# Patient Record
Sex: Female | Born: 1960
Health system: Southern US, Community
[De-identification: ages and names within clinical notes are randomized; demographics above are authoritative.]

## PROBLEM LIST (undated history)

## (undated) DIAGNOSIS — Z87442 Personal history of urinary calculi: Secondary | ICD-10-CM

## (undated) DIAGNOSIS — I428 Other cardiomyopathies: Secondary | ICD-10-CM

## (undated) DIAGNOSIS — I4891 Unspecified atrial fibrillation: Secondary | ICD-10-CM

## (undated) DIAGNOSIS — F329 Major depressive disorder, single episode, unspecified: Secondary | ICD-10-CM

## (undated) DIAGNOSIS — M8580 Other specified disorders of bone density and structure, unspecified site: Secondary | ICD-10-CM

## (undated) DIAGNOSIS — N200 Calculus of kidney: Secondary | ICD-10-CM

## (undated) DIAGNOSIS — E785 Hyperlipidemia, unspecified: Secondary | ICD-10-CM

## (undated) DIAGNOSIS — E669 Obesity, unspecified: Secondary | ICD-10-CM

## (undated) DIAGNOSIS — F32A Depression, unspecified: Secondary | ICD-10-CM

## (undated) DIAGNOSIS — J45909 Unspecified asthma, uncomplicated: Secondary | ICD-10-CM

## (undated) DIAGNOSIS — I5022 Chronic systolic (congestive) heart failure: Secondary | ICD-10-CM

## (undated) DIAGNOSIS — I2510 Atherosclerotic heart disease of native coronary artery without angina pectoris: Secondary | ICD-10-CM

## (undated) DIAGNOSIS — E039 Hypothyroidism, unspecified: Secondary | ICD-10-CM

## (undated) DIAGNOSIS — F419 Anxiety disorder, unspecified: Secondary | ICD-10-CM

## (undated) HISTORY — DX: Other cardiomyopathies: I42.8

## (undated) HISTORY — DX: Chronic systolic (congestive) heart failure: I50.22

## (undated) HISTORY — DX: Atherosclerotic heart disease of native coronary artery without angina pectoris: I25.10

## (undated) HISTORY — PX: ABDOMINAL HYSTERECTOMY: SHX81

## (undated) HISTORY — DX: Unspecified atrial fibrillation: I48.91

---
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
## 1997-02-13 HISTORY — PX: EYE SURGERY: SHX253

## 1998-03-25 ENCOUNTER — Other Ambulatory Visit: Admission: RE | Admit: 1998-03-25 | Discharge: 1998-03-25 | Payer: Self-pay | Admitting: Obstetrics & Gynecology

## 1998-05-19 ENCOUNTER — Ambulatory Visit (HOSPITAL_COMMUNITY): Admission: RE | Admit: 1998-05-19 | Discharge: 1998-05-19 | Payer: Self-pay | Admitting: Obstetrics & Gynecology

## 1998-05-19 ENCOUNTER — Encounter: Payer: Self-pay | Admitting: Obstetrics & Gynecology

## 1999-04-05 ENCOUNTER — Other Ambulatory Visit: Admission: RE | Admit: 1999-04-05 | Discharge: 1999-04-05 | Payer: Self-pay | Admitting: Obstetrics and Gynecology

## 2001-01-15 ENCOUNTER — Other Ambulatory Visit: Admission: RE | Admit: 2001-01-15 | Discharge: 2001-01-15 | Payer: Self-pay | Admitting: Obstetrics and Gynecology

## 2001-02-20 ENCOUNTER — Other Ambulatory Visit: Admission: RE | Admit: 2001-02-20 | Discharge: 2001-02-20 | Payer: Self-pay | Admitting: Endocrinology

## 2002-05-20 ENCOUNTER — Other Ambulatory Visit: Admission: RE | Admit: 2002-05-20 | Discharge: 2002-05-20 | Payer: Self-pay | Admitting: Obstetrics and Gynecology

## 2002-06-11 ENCOUNTER — Observation Stay (HOSPITAL_COMMUNITY): Admission: RE | Admit: 2002-06-11 | Discharge: 2002-06-12 | Payer: Self-pay | Admitting: Obstetrics and Gynecology

## 2002-06-11 ENCOUNTER — Encounter (INDEPENDENT_AMBULATORY_CARE_PROVIDER_SITE_OTHER): Payer: Self-pay | Admitting: Specialist

## 2003-08-03 ENCOUNTER — Other Ambulatory Visit: Admission: RE | Admit: 2003-08-03 | Discharge: 2003-08-03 | Payer: Self-pay | Admitting: Obstetrics and Gynecology

## 2004-10-06 ENCOUNTER — Other Ambulatory Visit: Admission: RE | Admit: 2004-10-06 | Discharge: 2004-10-06 | Payer: Self-pay | Admitting: Obstetrics and Gynecology

## 2007-02-20 ENCOUNTER — Other Ambulatory Visit: Admission: RE | Admit: 2007-02-20 | Discharge: 2007-02-20 | Payer: Self-pay | Admitting: Interventional Radiology

## 2007-02-20 ENCOUNTER — Encounter: Admission: RE | Admit: 2007-02-20 | Discharge: 2007-02-20 | Payer: Self-pay | Admitting: Surgery

## 2007-02-20 ENCOUNTER — Encounter (INDEPENDENT_AMBULATORY_CARE_PROVIDER_SITE_OTHER): Payer: Self-pay | Admitting: Interventional Radiology

## 2010-03-06 ENCOUNTER — Encounter: Payer: Self-pay | Admitting: Surgery

## 2010-07-01 NOTE — H&P (Signed)
NAME:  Kiara [name], GROTHE NO.:  [id number]   MEDICAL RECORD NO.:  [id number]                   PATIENT TYPE:  OBV   LOCATION:  9198                                 FACILITY:  [facility]   PHYSICIAN:  Juluis Mire, M.D.                DATE OF BIRTH:  12/20/1960   DATE OF ADMISSION:  06/11/2002  DATE OF DISCHARGE:                                HISTORY & PHYSICAL   HISTORY OF PRESENT ILLNESS:  Patient is a 50 year old, gravida 1, para 1,  married white female who presents for laparoscopic-assisted vaginal  hysterectomy.   In relation to the present admission, patient has noted that her cycles have  become extremely irregular; she has cycles every two to three weeks.  They  are fairly heavy.  This has been unresponsive to Prometrium.  She has been  unable to tolerate birth control pills due to side effects.  With this  bleeding pattern, her hemoglobin has been slightly depressed with a value of  12; we have remained stable with iron sulfate supplementation.  She did  undergo a saline-infusion ultrasound that was unremarkable.  Endometrial  sampling revealed proliferative endometrium with no evidence of hyperplastic  changes.  In view of the problems with continued abnormal bleeding,  unresponsive conservative management, she presents for laparoscopic-assisted  vaginal hysterectomy; ovaries will be left in at her request.  The potential  risks of bleeding at transformation have been discussed.   ALLERGIES:  In terms of allergies, the patient has no know drug allergies.   MEDICATIONS:  1. Zoloft.  2. Trazodone.  3. She is also on Fosamax for bone maintenance.   PAST MEDICAL HISTORY:  1. She had the usual childhood diseases without any significant sequelae.  2..  Of note, she has been diagnosed with significant osteopenia and has  been placed on Fosamax by Dr. __________.  She is also taking calcium and D  for this.  1. She does have a history of cervical  dysplasia, treated with cryotherapy     in the past.  2. She has also been diagnosed in the past with a thyroid nodule on the left     side; this was felt to be a benign process.  3. Also, under active management for depression.   OBSTETRICAL HISTORY:  One spontaneous vaginal delivery.   PAST SURGICAL HISTORY:  No previous surgical history.   FAMILY HISTORY:  Noncontributory.   SOCIAL HISTORY:  No tobacco or alcohol use.   REVIEW OF SYSTEMS:  Noncontributory.   PHYSICAL EXAMINATION:  VITAL SIGNS:  Patient is afebrile with stable vital  signs.  HEENT EXAM:  Patient is normocephalic.  Pupils are equal, round, and  reactive to light and accommodation.  Extraocular movements are intact.  Sclerae and conjunctivae are clear.  Oropharynx is clear.  NECK:  Without thyromegaly.  She continues to have a small nodular density  on the left lobe of the thyroid, unchanged.  BREASTS:  No skin or nipple changes, dominant mass, adenopathy, or nipple  discharge.  LUNGS:  Clear.  CARDIOVASCULAR SYSTEM:  Regular rhythm and rate without murmurs or gallops.  ABDOMINAL EXAM:  Benign.  There is no mass, organomegaly, or tenderness.  PELVIC:  Normal external genitalia.  Vaginal mucosa is clear.  Cervix is  unremarkable.  Uterus, normal size, shape, and contour.  Adnexa, free of  masses or tenderness.  Rectovaginal exam is clear.  EXTREMITIES:  Trace edema.  NEUROLOGIC EXAM:  Grossly within normal limits.   IMPRESSION:  1. Abnormal uterine bleeding with associated menorrhagia, unresponsive to     conservative management.  2. Osteopenia.  3. Left thyroid nodule.   PLAN:  Patient will undergo laparoscopic-assisted vaginal hysterectomy for  management of continued abnormal uterine bleeding.  Alternatives have been  discussed, the risks of surgery explained, including the risk of infection,  the risk of hemorrhage can necessitate transfusion with the risk of AIDS or  hepatitis, the risk of injury to  adjacent organs that could require further  exploratory surgery, the risk of deep venous thrombosis and pulmonary  embolus.  Patient expressed understanding of indications and risks.                                               Juluis Mire, M.D.    JSM/MEDQ  D:  06/11/2002  T:  06/11/2002  Job:  [id number]

## 2010-07-01 NOTE — Op Note (Signed)
NAME:  Kiara [name], Kiara [name]                          ACCOUNT NO.:  [id number]   MEDICAL RECORD NO.:  [id number]                   PATIENT TYPE:  OBV   LOCATION:  9327                                 FACILITY:  [facility]   PHYSICIAN:  Juluis Mire, M.D.                DATE OF BIRTH:  Sep 23, 1960   DATE OF PROCEDURE:  06/11/2002  DATE OF DISCHARGE:                                 OPERATIVE REPORT   PREOPERATIVE DIAGNOSIS:  Abnormal uterine bleeding in the form of  menorrhagia, unresponsive to conservative management.   POSTOPERATIVE DIAGNOSIS:  Abnormal uterine bleeding in the form of  menorrhagia, unresponsive to conservative management, with pathology  pending.   PROCEDURE:  Laparoscopically-assisted vaginal hysterectomy.   SURGEON:  Juluis Mire, M.D.   ASSISTANT:  Dineen Kid. Rana Snare, M.D.   ANESTHESIA:  General endotracheal.   ESTIMATED BLOOD LOSS:  300 mL.   PACKS AND DRAINS:  None.   BLOOD REPLACED:  None.   COMPLICATIONS:  None.   INDICATIONS:  As dictated in the history and physical.   DESCRIPTION OF PROCEDURE:  The patient was taken to the OR and placed in the  supine position.  After a satisfactory level of general endotracheal  anesthesia was obtained, the patient was placed in the dorsal lithotomy  position using the Allen stirrups.  The abdomen, perineum, and vagina were  prepped out with Betadine.  A Hulka tenaculum was put in place and secured.  The bladder was emptied by in-and-out catheterization.  The patient was then  draped as a sterile field.  A subumbilical incision was made with a knife.  The Veress needle was introduced into the abdominal cavity.  The abdomen was  insufflated with approximately 4 L of carbon dioxide.  The operating  laparoscope was introduced.  Visualization with no evidence of injury to  adjacent organs.  A 12 mm trocar was put in place in the suprapubic area  under direct visualization.  Visualization of the pelvic cavity, which  revealed  the uterus to be upper limits of normal size.  Tubes and ovaries  were unremarkable.  There was no evidence of pelvic endometriosis or other  pelvic processes.  Appendix was surgically absent.  The upper abdomen  including the liver, tip of the gallbladder, and both lateral gutters, were  clear.  Using the Gyrus tripolar, the adnexa were freed up from the side of  the uterus.  First the right round ligament was cauterized and incised, the  right tube and mesosalpinx were cauterized and incised.  Then the right  utero-ovarian pedicle was cauterized and incised.  The ureter was well out  of the operative field.  Next the left round ligament was cauterized and  incised and the left tube and mesosalpinx were cauterized and incised, and  the left utero-ovarian pedicle was cauterized and incised.  We had good  hemostasis and freeing of the  adnexa.   At this point in time the abdomen was deflated of carbon dioxide and the  laparoscope was removed.  The legs were repositioned.  The Hulka tenaculum  was then removed.  A weighted speculum was placed in the vaginal vault.  The  cervix was grasped with a Christella Hartigan tenaculum.  The cul-de-sac was entered  sharply.  Both uterosacral ligaments were clamped, cut, and suture ligated  with 0 Vicryl.  The reflection of the vaginal mucosa anteriorly was incised  and the bladder flap dissected superiorly.  The paracervical tissue was  clamped, cut, and suture ligated with 0 Vicryl.  The vesicouterine space was  entered sharply and a retractor was put in place to retract the bladder  superiorly.  Using the clamp, cut, and tie technique with suture ligature of  0 Vicryl, the parametrium was serially separated from the sides of the  uterus.  The uterus was then flipped and the remaining pedicles were clamped  and cut and the uterus passed off the operative field.  The pedicles were  secured with free ties of 0 Vicryl.  A uterosacral plication stitch of 0  Vicryl was  put in place.  The vaginal mucosa was reapproximated in a  vertical fashion with interrupted figure-of-eights of 0 Vicryl.  We had good  hemostasis.  A sponge on a sponge stick was placed in the vaginal vault.  A  Foley was placed to straight drainage, which revealed an adequate amount of  clear urine.   At this point in time the laparoscope was reintroduced, the abdomen was  reinflated with carbon dioxide.  Some oozing at the bladder base was  controlled using cautery.  Otherwise we had excellent hemostasis and good,  clear urine output.  Both ovarian pedicles were hemostatically intact.  The  abdomen was deflated of carbon dioxide, all trocars removed.  The  subumbilical incision closed with interrupted subcuticulars of 4-0 Vicryl.  The suprapubic incision was closed with Steri-Strips.  The sponge on a  sponge stick was removed.  The patient was taken out of the dorsal lithotomy  position and once alert and extubated transferred to the recovery room in  good condition.  Sponge, instrument, and needle count reported as correct by  the circulating nurse x2.                                               Juluis Mire, M.D.    JSM/MEDQ  D:  06/12/2002  T:  06/12/2002  Job:  [id number]

## 2010-07-01 NOTE — Discharge Summary (Signed)
   NAME:  Kiara [name], Kiara [name]                          ACCOUNT NO.:  [id number]   MEDICAL RECORD NO.:  [id number]                   PATIENT TYPE:  OBV   LOCATION:  9327                                 FACILITY:  [facility]   PHYSICIAN:  Juluis Mire, M.D.                DATE OF BIRTH:  [birth year]   DATE OF ADMISSION:  06/11/2002  DATE OF DISCHARGE:  06/12/2002                                 DISCHARGE SUMMARY   ADMISSION DIAGNOSES:  Abnormal uterine bleeding, thought to be secondary to  uterine adenomyosis.   POSTOPERATIVE DIAGNOSES:  Abnormal uterine bleeding, thought to be secondary  to uterine adenomyosis.   PATHOLOGY:  Pending.   PROCEDURE:  Laparoscopic-assisted vaginal hysterectomy.   HISTORY AND PHYSICAL:  For a complete history and physical, please see the  dictated noted.   HOSPITAL COURSE:  Patient underwent laparoscopic-assisted vaginal  hysterectomy without complications.  Postop, did well.  First postop day,  was afebrile, stable vital signs.  Abdomen was soft, nontender.  Bowel  sounds active.  Incisions were intact.  She had no active vaginal bleeding.  She was voiding without difficulty and tolerating her diet.  Postop  hemoglobin 10.9.   PROBLEMS OR COMPLICATIONS:  None were encountered during her stay in the  hospital.   CONDITION ON DISCHARGE:  Patient was discharged home in stable condition.   DISPOSITION:   ACTIVITY:  Patient is to avoid lifting, vaginal entry, or driving a car.  She is to watch for signs of infection, nausea, vomiting, increase in  abdominal pain, or active vaginal bleeding.   FOLLOW UP:  Follow up in the office in one week.   DISCHARGE MEDICATIONS:  Discharged home on Tylox as needed for pain.                                               Juluis Mire, M.D.    JSM/MEDQ  D:  06/12/2002  T:  06/12/2002  Job:  [id number]

## 2011-02-14 DIAGNOSIS — Z87442 Personal history of urinary calculi: Secondary | ICD-10-CM

## 2011-02-14 DIAGNOSIS — N200 Calculus of kidney: Secondary | ICD-10-CM

## 2011-02-14 HISTORY — DX: Personal history of urinary calculi: Z87.442

## 2011-02-14 HISTORY — DX: Calculus of kidney: N20.0

## 2011-12-19 ENCOUNTER — Telehealth (INDEPENDENT_AMBULATORY_CARE_PROVIDER_SITE_OTHER): Payer: Self-pay

## 2011-12-19 ENCOUNTER — Other Ambulatory Visit (INDEPENDENT_AMBULATORY_CARE_PROVIDER_SITE_OTHER): Payer: Self-pay

## 2011-12-19 DIAGNOSIS — E042 Nontoxic multinodular goiter: Secondary | ICD-10-CM

## 2011-12-19 NOTE — Telephone Encounter (Signed)
LMOM for pt to call. We no longer do U/S in office. Pt needs u/s at [facility] prior to appt. Awaiting call from pt. Order in epic.

## 2011-12-21 ENCOUNTER — Encounter (INDEPENDENT_AMBULATORY_CARE_PROVIDER_SITE_OTHER): Payer: Self-pay

## 2011-12-26 ENCOUNTER — Encounter (INDEPENDENT_AMBULATORY_CARE_PROVIDER_SITE_OTHER): Payer: Self-pay | Admitting: Surgery

## 2012-08-21 ENCOUNTER — Other Ambulatory Visit (HOSPITAL_COMMUNITY): Payer: Self-pay | Admitting: Obstetrics and Gynecology

## 2012-08-21 DIAGNOSIS — E041 Nontoxic single thyroid nodule: Secondary | ICD-10-CM

## 2012-08-23 ENCOUNTER — Ambulatory Visit (HOSPITAL_COMMUNITY): Payer: BC Managed Care – PPO

## 2012-08-27 ENCOUNTER — Ambulatory Visit (HOSPITAL_COMMUNITY)
Admission: RE | Admit: 2012-08-27 | Discharge: 2012-08-27 | Disposition: A | Discharge status: Home / Self Care | Payer: BC Managed Care – PPO | Source: Ambulatory Visit | Attending: Obstetrics and Gynecology | Admitting: Obstetrics and Gynecology

## 2012-08-27 DIAGNOSIS — E041 Nontoxic single thyroid nodule: Secondary | ICD-10-CM | POA: Insufficient documentation

## 2013-02-13 DIAGNOSIS — I509 Heart failure, unspecified: Secondary | ICD-10-CM

## 2013-02-13 DIAGNOSIS — I499 Cardiac arrhythmia, unspecified: Secondary | ICD-10-CM

## 2013-02-13 HISTORY — DX: Heart failure, unspecified: I50.9

## 2013-02-13 HISTORY — DX: Cardiac arrhythmia, unspecified: I49.9

## 2013-07-14 HISTORY — PX: CARDIAC CATHETERIZATION: SHX172

## 2013-07-16 ENCOUNTER — Emergency Department (HOSPITAL_COMMUNITY): Payer: BC Managed Care – PPO

## 2013-07-16 ENCOUNTER — Inpatient Hospital Stay (HOSPITAL_COMMUNITY)
Admission: EM | Admit: 2013-07-16 | Discharge: 2013-07-21 | DRG: 287 | Disposition: A | Discharge status: Home / Self Care | Payer: BC Managed Care – PPO | Attending: Cardiology | Admitting: Cardiology

## 2013-07-16 ENCOUNTER — Encounter (HOSPITAL_COMMUNITY): Payer: Self-pay | Admitting: Emergency Medicine

## 2013-07-16 DIAGNOSIS — I4891 Unspecified atrial fibrillation: Secondary | ICD-10-CM

## 2013-07-16 DIAGNOSIS — F329 Major depressive disorder, single episode, unspecified: Secondary | ICD-10-CM | POA: Diagnosis present

## 2013-07-16 DIAGNOSIS — E669 Obesity, unspecified: Secondary | ICD-10-CM | POA: Diagnosis present

## 2013-07-16 DIAGNOSIS — I5189 Other ill-defined heart diseases: Secondary | ICD-10-CM | POA: Diagnosis present

## 2013-07-16 DIAGNOSIS — Z7901 Long term (current) use of anticoagulants: Secondary | ICD-10-CM

## 2013-07-16 DIAGNOSIS — E785 Hyperlipidemia, unspecified: Secondary | ICD-10-CM | POA: Diagnosis present

## 2013-07-16 DIAGNOSIS — E039 Hypothyroidism, unspecified: Secondary | ICD-10-CM | POA: Diagnosis present

## 2013-07-16 DIAGNOSIS — I420 Dilated cardiomyopathy: Secondary | ICD-10-CM

## 2013-07-16 DIAGNOSIS — J45909 Unspecified asthma, uncomplicated: Secondary | ICD-10-CM | POA: Diagnosis present

## 2013-07-16 DIAGNOSIS — I509 Heart failure, unspecified: Secondary | ICD-10-CM

## 2013-07-16 DIAGNOSIS — I059 Rheumatic mitral valve disease, unspecified: Secondary | ICD-10-CM | POA: Diagnosis present

## 2013-07-16 DIAGNOSIS — F3289 Other specified depressive episodes: Secondary | ICD-10-CM | POA: Diagnosis present

## 2013-07-16 DIAGNOSIS — I5021 Acute systolic (congestive) heart failure: Principal | ICD-10-CM | POA: Diagnosis present

## 2013-07-16 DIAGNOSIS — Z8709 Personal history of other diseases of the respiratory system: Secondary | ICD-10-CM

## 2013-07-16 DIAGNOSIS — I2510 Atherosclerotic heart disease of native coronary artery without angina pectoris: Secondary | ICD-10-CM | POA: Diagnosis present

## 2013-07-16 DIAGNOSIS — Z6829 Body mass index (BMI) 29.0-29.9, adult: Secondary | ICD-10-CM

## 2013-07-16 DIAGNOSIS — I340 Nonrheumatic mitral (valve) insufficiency: Secondary | ICD-10-CM | POA: Diagnosis present

## 2013-07-16 DIAGNOSIS — Z881 Allergy status to other antibiotic agents status: Secondary | ICD-10-CM

## 2013-07-16 DIAGNOSIS — Z87442 Personal history of urinary calculi: Secondary | ICD-10-CM

## 2013-07-16 DIAGNOSIS — I200 Unstable angina: Secondary | ICD-10-CM

## 2013-07-16 DIAGNOSIS — I428 Other cardiomyopathies: Secondary | ICD-10-CM | POA: Diagnosis present

## 2013-07-16 DIAGNOSIS — Z79899 Other long term (current) drug therapy: Secondary | ICD-10-CM

## 2013-07-16 DIAGNOSIS — Z9089 Acquired absence of other organs: Secondary | ICD-10-CM

## 2013-07-16 HISTORY — DX: Unspecified asthma, uncomplicated: J45.909

## 2013-07-16 HISTORY — DX: Obesity, unspecified: E66.9

## 2013-07-16 HISTORY — DX: Hyperlipidemia, unspecified: E78.5

## 2013-07-16 HISTORY — DX: Major depressive disorder, single episode, unspecified: F32.9

## 2013-07-16 HISTORY — DX: Depression, unspecified: F32.A

## 2013-07-16 HISTORY — DX: Hypothyroidism, unspecified: E03.9

## 2013-07-16 HISTORY — DX: Calculus of kidney: N20.0

## 2013-07-16 HISTORY — DX: Other specified disorders of bone density and structure, unspecified site: M85.80

## 2013-07-16 LAB — TSH: TSH: 1.45 u[IU]/mL (ref 0.350–4.500)

## 2013-07-16 LAB — BASIC METABOLIC PANEL
BUN: 11 mg/dL (ref 6–23)
CO2: 18 mEq/L — ABNORMAL LOW (ref 19–32)
Calcium: 9.3 mg/dL (ref 8.4–10.5)
Chloride: 108 mEq/L (ref 96–112)
Creatinine, Ser: 0.65 mg/dL (ref 0.50–1.10)
GFR calc Af Amer: >90 mL/min (ref >90)
GFR calc non Af Amer: >90 mL/min (ref >90)
Glucose, Bld: 105 mg/dL — ABNORMAL HIGH (ref 70–99)
Potassium: 4.3 mEq/L (ref 3.7–5.3)
Sodium: 140 mEq/L (ref 137–147)

## 2013-07-16 LAB — TROPONIN I
Troponin I: <0.3 ng/mL (ref <0.30)
Troponin I: <0.3 ng/mL (ref <0.30)

## 2013-07-16 LAB — CBC
HCT: 41.4 % (ref 36.0–46.0)
Hemoglobin: 14.2 g/dL (ref 12.0–15.0)
MCH: 30.1 pg (ref 26.0–34.0)
MCHC: 34.3 g/dL (ref 30.0–36.0)
MCV: 87.7 fL (ref 78.0–100.0)
Platelets: 166 10*3/uL (ref 150–400)
RBC: 4.72 MIL/uL (ref 3.87–5.11)
RDW: 13.2 % (ref 11.5–15.5)
WBC: 7.2 10*3/uL (ref 4.0–10.5)

## 2013-07-16 LAB — PROTIME-INR
INR: 1.04 (ref 0.00–1.49)
Prothrombin Time: 13.4 seconds (ref 11.6–15.2)

## 2013-07-16 LAB — I-STAT TROPONIN, ED: Troponin i, poc: 0 ng/mL (ref 0.00–0.08)

## 2013-07-16 LAB — MRSA PCR SCREENING: MRSA by PCR: NEGATIVE

## 2013-07-16 LAB — PRO B NATRIURETIC PEPTIDE: Pro B Natriuretic peptide (BNP): 1681 pg/mL — ABNORMAL HIGH (ref 0–125)

## 2013-07-16 LAB — HEMOGLOBIN A1C
Hgb A1c MFr Bld: 5.5 % (ref <5.7)
Mean Plasma Glucose: 111 mg/dL (ref <117)

## 2013-07-16 MED ORDER — DILTIAZEM HCL 100 MG IV SOLR: 5.0000 mg/h | INTRAVENOUS | Status: DC

## 2013-07-16 MED ORDER — ASPIRIN 81 MG PO CHEW: 324.0000 mg | CHEWABLE_TABLET | ORAL | Status: DC

## 2013-07-16 MED ORDER — ASPIRIN 300 MG RE SUPP: 300.0000 mg | RECTAL | Status: DC

## 2013-07-16 MED ORDER — DILTIAZEM HCL 25 MG/5ML IV SOLN
15.0000 mg | Freq: Once | INTRAVENOUS | Status: AC
  Administered 2013-07-16: 15 mg via INTRAVENOUS
  Filled 2013-07-16: qty 5

## 2013-07-16 MED ORDER — DEXTROSE 5 % IV SOLN
5.0000 mg/h | INTRAVENOUS | Status: DC
  Administered 2013-07-18: 5 mg/h via INTRAVENOUS
  Filled 2013-07-16 (×2): qty 100

## 2013-07-16 MED ORDER — DEXTROSE 5 % IV SOLN
5.0000 mg/h | INTRAVENOUS | Status: DC
  Administered 2013-07-16 – 2013-07-17 (×2): 5 mg/h via INTRAVENOUS
  Administered 2013-07-17: 15 mg/h via INTRAVENOUS
  Administered 2013-07-18: 5 mg/h via INTRAVENOUS
  Filled 2013-07-16 (×2): qty 100

## 2013-07-16 MED ORDER — HEPARIN BOLUS VIA INFUSION
4000.0000 [IU] | Freq: Once | INTRAVENOUS | Status: AC
  Administered 2013-07-16: 4000 [IU] via INTRAVENOUS
  Filled 2013-07-16: qty 4000

## 2013-07-16 MED ORDER — NITROGLYCERIN IN D5W 200-5 MCG/ML-% IV SOLN
2.0000 ug/min | INTRAVENOUS | Status: DC
  Administered 2013-07-16: 5 ug/min via INTRAVENOUS
  Filled 2013-07-16: qty 250

## 2013-07-16 MED ORDER — ATORVASTATIN CALCIUM 40 MG PO TABS
40.0000 mg | ORAL_TABLET | Freq: Every day | ORAL | Status: DC
  Administered 2013-07-16 – 2013-07-20 (×5): 40 mg via ORAL
  Filled 2013-07-16 (×7): qty 1

## 2013-07-16 MED ORDER — ACETAMINOPHEN 325 MG PO TABS
650.0000 mg | ORAL_TABLET | Freq: Four times a day (QID) | ORAL | Status: DC | PRN
  Administered 2013-07-16 – 2013-07-21 (×4): 650 mg via ORAL
  Filled 2013-07-16 (×5): qty 2

## 2013-07-16 MED ORDER — ASPIRIN EC 81 MG PO TBEC
81.0000 mg | DELAYED_RELEASE_TABLET | Freq: Every day | ORAL | Status: DC
  Administered 2013-07-17: 81 mg via ORAL
  Filled 2013-07-16: qty 1

## 2013-07-16 MED ORDER — HEPARIN (PORCINE) IN NACL 100-0.45 UNIT/ML-% IJ SOLN
1250.0000 [IU]/h | INTRAMUSCULAR | Status: DC
  Administered 2013-07-16 – 2013-07-17 (×2): 1250 [IU]/h via INTRAVENOUS
  Filled 2013-07-16 (×2): qty 250

## 2013-07-16 MED ORDER — NITROGLYCERIN 0.4 MG SL SUBL: 0.4000 mg | SUBLINGUAL_TABLET | SUBLINGUAL | Status: DC | PRN

## 2013-07-16 MED ORDER — FUROSEMIDE 10 MG/ML IJ SOLN
40.0000 mg | Freq: Two times a day (BID) | INTRAMUSCULAR | Status: AC
Start: 2013-07-16 — End: 2013-07-17
  Administered 2013-07-16 – 2013-07-17 (×2): 40 mg via INTRAVENOUS
  Filled 2013-07-16 (×2): qty 4

## 2013-07-16 NOTE — H&P (Signed)
Kiara [name] is an 53 y.o. female.   Chief Complaint: CP, Afib HPI:   The patient is a 53 yo obese female with a history of HLD, asthma, osteopenia.  She works in [employer]  She presents with CP and in [place].  She was given IV diltiazem and rate improved.    The patient describes "an elephant sitting on my chest" most severe the last two days but has had the feeling for over a month.  She also reports SOB, heart racing, and diaphoresis but otherwise denies nausea, vomiting, fever, orthopnea, dizziness, PND, cough, congestion, abdominal pain, hematochezia, melena, lower extremity edema, claudication.  No family history off CAD.     Past Medical History  Diagnosis Date  . Asthma   . Hyperlipemia   . Obesity   . Osteopenia   . Nephrolithiasis     Past Surgical History  Procedure Laterality Date  . Appendectomy    . Abdominal hysterectomy      History reviewed. No pertinent family history. Social History:  reports that she has never smoked. She does not have any smokeless tobacco history on file. She reports that she drinks alcohol. She reports that she does not use illicit drugs.  Allergies: No Known Allergies   (Not in a hospital admission)  Results for orders placed during the hospital encounter of 07/16/13 (from the past 48 hour(s))  CBC     Status: None   Collection Time    07/16/13  1:35 PM      Result Value Ref Range   WBC 7.2  4.0 - 10.5 K/uL   RBC 4.72  3.87 - 5.11 MIL/uL   Hemoglobin 14.2  12.0 - 15.0 g/dL   HCT 41.4  36.0 - 46.0 %   MCV 87.7  78.0 - 100.0 fL   MCH 30.1  26.0 - 34.0 pg   MCHC 34.3  30.0 - 36.0 g/dL   RDW 13.2  11.5 - 15.5 %   Platelets 166  150 - 400 K/uL  BASIC METABOLIC PANEL     Status: Abnormal   Collection Time    07/16/13  1:35 PM      Result Value Ref Range   Sodium 140  137 - 147 mEq/L   Potassium 4.3  3.7 - 5.3 mEq/L   Chloride 108  96 - 112 mEq/L   CO2 18 (*) 19 - 32 mEq/L   Glucose, Bld 105 (*) 70 - 99 mg/dL    BUN 11  6 - 23 mg/dL   Creatinine, Ser 0.65  0.50 - 1.10 mg/dL   Calcium 9.3  8.4 - 10.5 mg/dL   GFR calc non Af Amer >90  >90 mL/min   GFR calc Af Amer >90  >90 mL/min   Comment: (NOTE)     The eGFR has been calculated using the CKD EPI equation.     This calculation has not been validated in all clinical situations.     eGFR's persistently <90 mL/min signify possible Chronic Kidney     Disease.  PROTIME-INR     Status: None   Collection Time    07/16/13  1:35 PM      Result Value Ref Range   Prothrombin Time 13.4  11.6 - 15.2 seconds   INR 1.04  0.00 - 1.49  PRO B NATRIURETIC PEPTIDE     Status: Abnormal   Collection Time    07/16/13  1:35 PM      Result  Value Ref Range   Pro B Natriuretic peptide (BNP) 1681.0 (*) 0 - 125 pg/mL  I-STAT TROPOININ, ED     Status: None   Collection Time    07/16/13  1:40 PM      Result Value Ref Range   Troponin i, poc 0.00  0.00 - 0.08 ng/mL   Comment 3            Comment: Due to the release kinetics of cTnI,     a negative result within the first hours     of the onset of symptoms does not rule out     myocardial infarction with certainty.     If myocardial infarction is still suspected,     repeat the test at appropriate intervals.   Dg Chest Port 1 View  07/16/2013   CLINICAL DATA:  AFib with RVR  EXAM: PORTABLE CHEST - 1 VIEW  COMPARISON:  None.  FINDINGS: Chronic interstitial markings. Mild left basilar opacity, likely atelectasis. No pleural effusion or pneumothorax.  The heart is normal in size.  IMPRESSION: No evidence of acute cardiopulmonary disease.   Electronically Signed   By: Julian Hy M.D.   On: 07/16/2013 13:24    Review of Systems  Constitutional: Positive for diaphoresis. Negative for fever.  HENT: Negative for congestion.   Respiratory: Positive for shortness of breath. Negative for cough and wheezing.   Cardiovascular: Positive for chest pain and palpitations. Negative for orthopnea, leg swelling and PND.   Gastrointestinal: Negative for nausea, vomiting, abdominal pain, blood in stool and melena.  Genitourinary: Negative for hematuria.  Musculoskeletal: Negative for myalgias.  Neurological: Negative for dizziness.  All other systems reviewed and are negative.   Blood pressure 123/79, pulse 99, temperature 98.7 F (37.1 C), temperature source Oral, resp. rate 17, height '5\' 7"'  (1.702 m), weight 189 lb (85.73 kg), SpO2 95.00%. Physical Exam  Nursing note and vitals reviewed. Constitutional: She is oriented to person, place, and time. She appears well-developed.  Obese  HENT:  Head: Normocephalic and atraumatic.  Mouth/Throat: Oropharynx is clear and moist.  Eyes: EOM are normal. Pupils are equal, round, and reactive to light. No scleral icterus.  Neck: Normal range of motion. JVD present.  Cardiovascular: Normal rate, S1 normal and S2 normal.  An irregularly irregular rhythm present.  No murmur heard. Pulses:      Radial pulses are 2+ on the right side, and 2+ on the left side.       Dorsalis pedis pulses are 2+ on the right side, and 2+ on the left side.  No carotid bruit.  Respiratory: Effort normal and breath sounds normal. No respiratory distress. She has no wheezes. She has no rales.  GI: Soft. Bowel sounds are normal. She exhibits no distension. There is no tenderness.  Musculoskeletal: She exhibits no edema.  Lymphadenopathy:    She has no cervical adenopathy.  Neurological: She is alert and oriented to person, place, and time. She exhibits normal muscle tone.  Skin: Skin is warm and dry.  Psychiatric: She has a normal mood and affect.     Assessment/Plan  Active Problems:   Unstable angina   Atrial fibrillation with RVR   Obesity  Plan:  Admit to stepdown.  Start IV heparin and NTG.  Titrate for CP as BP allows.  Continue IV cardizem.  Cycle troponin and order echo, lipids, TSH, A1C.  NPO after MN for cath or myovue.  May need TEE/DCCV if she does not convert.  Tarri Fuller, [facility] 07/16/2013, 3:12 PM  Patient seen with PA, agree with the above note.  1. Atrial fibrillation: Patient was noted to be in atrial fibrillation with RVR.  She has felt tachypalpitations for several weeks along with chest pressure and exertional dyspnea.  Her atrial fibrillation may have been present for a while now.   - Diltiazem gtt for now for rate control.  - Heparin gtt for now.  If she rule out for MI, chest pain/pressure resolves rate control, and EF is normal, can start Xarelto or Eliquis.  Otherwise, she will need cardiac catheterization.   - Echo today.  - If she does not convert to NSR on her own, would plan TEE-guided cardioversion eventually given her significant symptoms while in atrial fibrillation.  2. Acute CHF: Patient is volume overloaded on exam with orthopnea and exertional dyspnea.  BNP elevated.  ?Acute diastolic CHF triggered by afib/RVR versus tachy-mediated cardiomyopathy.   - Echo - Lasix 40 mg IV bid  3. Chest pressure/pain: No ischemic ECG changes, initial troponin negative.  She has some ongoing pain.  Continue aspirin and heparin gtt for now.  Will cycle cardiac enzymes and work on slowing heart rate.  If CP resolves with rate control, cardiac enzymes are negative, and EF ok on echo, would plan for outpatient Cardiolite.  Otherwise, think cardiac cath would be reasonable tomorrow morning. If no cath, start NOAC tomorrow.   Larey Dresser 07/16/2013 4:10 PM

## 2013-07-16 NOTE — ED Notes (Signed)
Admitting MD at bedside at this time.

## 2013-07-16 NOTE — ED Provider Notes (Signed)
CSN: [id number]     Arrival date & time 07/16/13  1236 History   First MD Initiated Contact with Patient 07/16/13 1242     Chief Complaint  Patient presents with  . Atrial Fibrillation     (Consider location/radiation/quality/duration/timing/severity/associated sxs/prior Treatment) HPI Comments: Intermittent CP, SOB. CP constant since yesterday, pressure like, nonradiating.  Patient is a 53 y.o. female presenting with atrial fibrillation. The history is provided by the patient.  Atrial Fibrillation This is a new problem. The current episode started more than 2 days ago. The problem occurs daily. The problem has been gradually worsening. Associated symptoms include chest pain and shortness of breath. Pertinent negatives include no abdominal pain. Nothing aggravates the symptoms. Nothing relieves the symptoms. She has tried nothing for the symptoms.    Past Medical History  Diagnosis Date  . Asthma   . Hyperlipemia   . Obesity   . Osteopenia   . Nephrolithiasis    Past Surgical History  Procedure Laterality Date  . Appendectomy    . Abdominal hysterectomy     History reviewed. No pertinent family history. History  Substance Use Topics  . Smoking status: Never Smoker   . Smokeless tobacco: Not on file  . Alcohol Use: Yes     Comment: social   OB History   Grav Para Term Preterm Abortions TAB SAB Ect Mult Living                 Review of Systems  Constitutional: Negative for fever.  Respiratory: Positive for shortness of breath. Negative for cough.   Cardiovascular: Positive for chest pain and palpitations.  Gastrointestinal: Negative for vomiting and abdominal pain.  All other systems reviewed and are negative.     Allergies  Review of patient's allergies indicates no known allergies.  Home Medications   Prior to Admission medications   Medication Sig Start Date End Date Taking? Authorizing Provider  sertraline (ZOLOFT) 100 MG tablet  07/04/13   Historical  Provider, MD   BP 123/79  Pulse 99  Temp(Src) 98.7 F (37.1 C) (Oral)  Resp 17  Ht 5\' 7"  (1.702 m)  Wt 189 lb (85.73 kg)  BMI 29.59 kg/m2  SpO2 95% Physical Exam  Nursing note and vitals reviewed. Constitutional: She is oriented to person, place, and time. She appears well-developed and well-nourished. No distress.  HENT:  Head: Normocephalic and atraumatic.  Eyes: EOM are normal. Pupils are equal, round, and reactive to light.  Neck: Normal range of motion. Neck supple.  Cardiovascular: An irregularly irregular rhythm present. Tachycardia present.  Exam reveals no friction rub.   No murmur heard. Pulmonary/Chest: Effort normal and breath sounds normal. No respiratory distress. She has no wheezes. She has no rales.  Abdominal: Soft. She exhibits no distension. There is no tenderness. There is no rebound.  Musculoskeletal: Normal range of motion. She exhibits no edema.  Neurological: She is alert and oriented to person, place, and time.  Skin: She is not diaphoretic.    ED Course  Procedures (including critical care time) Labs Review Labs Reviewed  BASIC METABOLIC PANEL - Abnormal; Notable for the following:    CO2 18 (*)    Glucose, Bld 105 (*)    All other components within normal limits  PRO B NATRIURETIC PEPTIDE - Abnormal; Notable for the following:    Pro B Natriuretic peptide (BNP) 1681.0 (*)    All other components within normal limits  CBC  PROTIME-INR  I-STAT TROPOININ, ED  Imaging Review Dg Chest Port 1 View  07/16/2013   CLINICAL DATA:  AFib with RVR  EXAM: PORTABLE CHEST - 1 VIEW  COMPARISON:  None.  FINDINGS: Chronic interstitial markings. Mild left basilar opacity, likely atelectasis. No pleural effusion or pneumothorax.  The heart is normal in size.  IMPRESSION: No evidence of acute cardiopulmonary disease.   Electronically Signed   By: Charline BillsSriyesh  Krishnan M.D.   On: 07/16/2013 13:24     EKG Interpretation   Date/Time:  Wednesday July 16 2013 12:45:30  EDT Ventricular Rate:  151 PR Interval:    QRS Duration: 90 QT Interval:  314 QTC Calculation: 498 R Axis:   85 Text Interpretation:  Atrial fibrillation Borderline prolonged QT interval  No prior EKGs Confirmed by Gwendolyn GrantWALDEN  MD, Staton Markey ([id number]) on 07/16/2013 3:06:32 PM      MDM   Final diagnoses:  Atrial fibrillation with RVR    60F presents with palpitations, chest pain, SOB. [facility]. No hx of Afib before. Intermittent CP and palpitations. CP described as heaviness. Here normal BPs, irreguarly irregular rhythm with tachycardia. Lungs clear, abdomen benign. Dilt started with good response. Patient admitted to Cards.    Dagmar HaitWilliam Gabrien Mentink, MD 07/16/13 [phone]

## 2013-07-16 NOTE — ED Notes (Signed)
Patient arrived via [facility] from Dr. Isidore Moos. Patient has been having shortness of breath x2 days that has increased today with heart palpations. She went to her Dr. Isidore Moos and she was found to be in Afib RVR that is new for her. She denies any diaphoresis, nausea. She also has heaviness in her chest.

## 2013-07-16 NOTE — Consult Note (Signed)
ANTICOAGULATION CONSULT NOTE - Initial Consult  Pharmacy Consult for Heparin Indication: atrial fibrillation, r/o ACS  No Known Allergies  Patient Measurements: Height: 5\' 7"  (170.2 cm) Weight: 189 lb (85.73 kg) IBW/kg (Calculated) : 61.6 Heparin Dosing Weight: 79kg  Vital Signs: Temp: 98.7 F (37.1 C) (06/03 1252) Temp src: Oral (06/03 1252) BP: 112/73 mmHg (06/03 1542) Pulse Rate: 99 (06/03 1542)  Labs:  Recent Labs  07/16/13 1335  HGB 14.2  HCT 41.4  PLT 166  LABPROT 13.4  INR 1.04  CREATININE 0.65    Estimated Creatinine Clearance: 89.3 ml/min (by C-G formula based on Cr of 0.65).   Medical History: Past Medical History  Diagnosis Date  . Asthma   . Hyperlipemia   . Obesity   . Osteopenia   . Nephrolithiasis     Medications:  No anticoagulants pta  Assessment: Kiara [name] presents to the ED with chest pain and SOB, found to be in afib RVR (CHADSVASC=1). She will begin IV heparin. Baseline renal function and CBC ok.  Goal of Therapy:  Heparin level 0.3-0.7 units/ml Monitor platelets by anticoagulation protocol: Yes   Plan:  1) Heparin bolus 4000 units x 1 2) Heparin drip at 1250 units/hr 3) Check 6 hour heparin level 4) Daily heparin level and CBC  Hessie Diener Keefer Soulliere 07/16/2013,3:48 PM

## 2013-07-16 NOTE — ED Notes (Signed)
Pt brought from Trauma A to Pod D31; pt undressed, placed in gown, on monitor, continuous pulse oximetry, blood pressure cuff and oxygen [place] (2L); family waiting outside of door

## 2013-07-16 NOTE — ED Notes (Signed)
Pt is talking with family at this time, no needs

## 2013-07-17 ENCOUNTER — Encounter (HOSPITAL_COMMUNITY)
Admission: EM | Disposition: A | Discharge status: Home / Self Care | Payer: BC Managed Care – PPO | Source: Home / Self Care | Attending: Cardiology

## 2013-07-17 DIAGNOSIS — I059 Rheumatic mitral valve disease, unspecified: Secondary | ICD-10-CM

## 2013-07-17 DIAGNOSIS — I340 Nonrheumatic mitral (valve) insufficiency: Secondary | ICD-10-CM

## 2013-07-17 DIAGNOSIS — I428 Other cardiomyopathies: Secondary | ICD-10-CM

## 2013-07-17 HISTORY — PX: LEFT HEART CATHETERIZATION WITH CORONARY ANGIOGRAM: SHX5451

## 2013-07-17 HISTORY — DX: Nonrheumatic mitral (valve) insufficiency: I34.0

## 2013-07-17 LAB — HEPARIN LEVEL (UNFRACTIONATED)
Heparin Unfractionated: 0.36 IU/mL (ref 0.30–0.70)
Heparin Unfractionated: 0.4 IU/mL (ref 0.30–0.70)

## 2013-07-17 LAB — LIPID PANEL
Cholesterol: 198 mg/dL (ref 0–200)
HDL: 59 mg/dL (ref >39)
LDL Cholesterol: 125 mg/dL — ABNORMAL HIGH (ref 0–99)
Total CHOL/HDL Ratio: 3.4 RATIO
Triglycerides: 69 mg/dL (ref <150)
VLDL: 14 mg/dL (ref 0–40)

## 2013-07-17 LAB — CBC
HCT: 41.2 % (ref 36.0–46.0)
Hemoglobin: 14.2 g/dL (ref 12.0–15.0)
MCH: 30.2 pg (ref 26.0–34.0)
MCHC: 34.5 g/dL (ref 30.0–36.0)
MCV: 87.7 fL (ref 78.0–100.0)
Platelets: 161 10*3/uL (ref 150–400)
RBC: 4.7 MIL/uL (ref 3.87–5.11)
RDW: 13.3 % (ref 11.5–15.5)
WBC: 6.7 10*3/uL (ref 4.0–10.5)

## 2013-07-17 LAB — TROPONIN I
Troponin I: <0.3 ng/mL (ref <0.30)
Troponin I: <0.3 ng/mL (ref <0.30)

## 2013-07-17 LAB — BASIC METABOLIC PANEL
BUN: 13 mg/dL (ref 6–23)
CO2: 21 mEq/L (ref 19–32)
Calcium: 9.5 mg/dL (ref 8.4–10.5)
Chloride: 104 mEq/L (ref 96–112)
Creatinine, Ser: 0.67 mg/dL (ref 0.50–1.10)
GFR calc Af Amer: >90 mL/min (ref >90)
GFR calc non Af Amer: >90 mL/min (ref >90)
Glucose, Bld: 104 mg/dL — ABNORMAL HIGH (ref 70–99)
Potassium: 3.8 mEq/L (ref 3.7–5.3)
Sodium: 141 mEq/L (ref 137–147)

## 2013-07-17 SURGERY — LEFT HEART CATHETERIZATION WITH CORONARY ANGIOGRAM
Anesthesia: LOCAL

## 2013-07-17 MED ORDER — VERAPAMIL HCL 2.5 MG/ML IV SOLN
INTRAVENOUS | Status: AC
  Filled 2013-07-17: qty 2

## 2013-07-17 MED ORDER — SODIUM CHLORIDE 0.9 % IV SOLN: 250.0000 mL | INTRAVENOUS | Status: DC | PRN

## 2013-07-17 MED ORDER — FENTANYL CITRATE 0.05 MG/ML IJ SOLN
INTRAMUSCULAR | Status: AC
  Filled 2013-07-17: qty 2

## 2013-07-17 MED ORDER — SODIUM CHLORIDE 0.9 % IV SOLN
1.0000 mL/kg/h | INTRAVENOUS | Status: DC
  Administered 2013-07-17 (×2): 1 mL/kg/h via INTRAVENOUS

## 2013-07-17 MED ORDER — SODIUM CHLORIDE 0.9 % IJ SOLN: 3.0000 mL | INTRAMUSCULAR | Status: DC | PRN

## 2013-07-17 MED ORDER — MIDAZOLAM HCL 2 MG/2ML IJ SOLN
INTRAMUSCULAR | Status: AC
  Filled 2013-07-17: qty 2

## 2013-07-17 MED ORDER — NITROGLYCERIN 0.2 MG/ML ON CALL CATH LAB
INTRAVENOUS | Status: AC
  Filled 2013-07-17: qty 1

## 2013-07-17 MED ORDER — ASPIRIN 81 MG PO CHEW: 81.0000 mg | CHEWABLE_TABLET | ORAL | Status: DC

## 2013-07-17 MED ORDER — ONDANSETRON HCL 4 MG/2ML IJ SOLN: 4.0000 mg | Freq: Four times a day (QID) | INTRAMUSCULAR | Status: DC | PRN

## 2013-07-17 MED ORDER — SODIUM CHLORIDE 0.9 % IV SOLN
INTRAVENOUS | Status: DC
  Administered 2013-07-17: 20:00:00 via INTRAVENOUS

## 2013-07-17 MED ORDER — SODIUM CHLORIDE 0.9 % IJ SOLN
3.0000 mL | Freq: Two times a day (BID) | INTRAMUSCULAR | Status: DC
  Administered 2013-07-17 – 2013-07-20 (×5): 3 mL via INTRAVENOUS

## 2013-07-17 MED ORDER — SODIUM CHLORIDE 0.9 % IV SOLN: 1.0000 mL/kg/h | INTRAVENOUS | Status: AC

## 2013-07-17 MED ORDER — SODIUM CHLORIDE 0.9 % IJ SOLN
3.0000 mL | Freq: Two times a day (BID) | INTRAMUSCULAR | Status: DC
  Administered 2013-07-17: 3 mL via INTRAVENOUS

## 2013-07-17 MED ORDER — APIXABAN 5 MG PO TABS
5.0000 mg | ORAL_TABLET | Freq: Two times a day (BID) | ORAL | Status: DC
  Administered 2013-07-17 – 2013-07-21 (×8): 5 mg via ORAL
  Filled 2013-07-17 (×9): qty 1

## 2013-07-17 MED ORDER — HEPARIN (PORCINE) IN NACL 2-0.9 UNIT/ML-% IJ SOLN
INTRAMUSCULAR | Status: AC
  Filled 2013-07-17: qty 1000

## 2013-07-17 MED ORDER — TRAMADOL HCL 50 MG PO TABS
50.0000 mg | ORAL_TABLET | Freq: Four times a day (QID) | ORAL | Status: DC | PRN
  Administered 2013-07-17: 50 mg via ORAL
  Filled 2013-07-17: qty 1

## 2013-07-17 MED ORDER — HEPARIN SODIUM (PORCINE) 1000 UNIT/ML IJ SOLN
INTRAMUSCULAR | Status: AC
  Filled 2013-07-17: qty 1

## 2013-07-17 MED ORDER — OFF THE BEAT BOOK
Freq: Once | Status: AC
  Administered 2013-07-17: 22:00:00
  Filled 2013-07-17: qty 1

## 2013-07-17 MED ORDER — SERTRALINE HCL 100 MG PO TABS
200.0000 mg | ORAL_TABLET | Freq: Every day | ORAL | Status: DC
  Administered 2013-07-17 – 2013-07-21 (×5): 200 mg via ORAL
  Filled 2013-07-17 (×5): qty 2

## 2013-07-17 MED ORDER — LIDOCAINE HCL (PF) 1 % IJ SOLN
INTRAMUSCULAR | Status: AC
  Filled 2013-07-17: qty 30

## 2013-07-17 NOTE — Care Management Note (Addendum)
    Page 1 of 1   07/18/2013     10:27:55 AM CARE MANAGEMENT NOTE 07/18/2013  Patient:  Kiara [name], Kiara [name]   Account Number:  [id number]  Date Initiated:  07/17/2013  Documentation initiated by:  Junius Creamer  Subjective/Objective Assessment:   adm w at fib     Action/Plan:   lives w fam, pcp dr walter pharr   Anticipated DC Date:     Anticipated DC Plan:        DC Planning Services  CM consult  Medication Assistance      Choice offered to / List presented to:             Status of service:   Medicare Important Message given?   (If response is "NO", the following Medicare IM given date fields will be blank) Date Medicare IM given:   Date Additional Medicare IM given:    Discharge Disposition:  HOME/SELF CARE  Per UR Regulation:  Reviewed for med. necessity/level of care/duration of stay  If discussed at Long Length of Stay Meetings, dates discussed:    Comments:  6/5 23a debbie Charlie Seda rn,bsn gave pt 30day free eliquis card and copay assist card.

## 2013-07-17 NOTE — H&P (View-Only) (Signed)
Patient Name: Kiara [name] Date of Encounter: 07/17/2013  Principal Problem:   Atrial fibrillation with RVR Active Problems:   Unstable angina   Obesity   Congestive dilated cardiomyopathy   Moderate mitral regurgitation   Length of Stay: 1  SUBJECTIVE  Feeling better. No angina or dyspnea. Only complaint is NTG related HA (NTG just stopped). Remains in AF with VR approx 100. Echo being done right now.  CURRENT MEDS . aspirin  324 mg Oral NOW   Or  . aspirin  300 mg Rectal NOW  . aspirin EC  81 mg Oral Daily  . atorvastatin  40 mg Oral q1800    OBJECTIVE   Intake/Output Summary (Last 24 hours) at 07/17/13 0930 Last data filed at 07/17/13 0747  Gross per 24 hour  Intake 327.26 ml  Output   3400 ml  Net -3072.74 ml   Filed Weights   07/16/13 1251 07/16/13 1700 07/17/13 0330  Weight: 189 lb (85.73 kg) 193 lb 5.5 oz (87.7 kg) 189 lb 9.5 oz (86 kg)    PHYSICAL EXAM Filed Vitals:   07/17/13 0715 07/17/13 0730 07/17/13 0745 07/17/13 0810  BP: 114/91 112/78 104/86 132/88  Pulse: 74 94  37  Temp:    98 F (36.7 C)  TempSrc:    Oral  Resp:  15  18  Height:      Weight:      SpO2: 93% 94%  92%   General: Alert, oriented x3, no distress Head: no evidence of trauma, PERRL, EOMI, no exophtalmos or lid lag, no myxedema, no xanthelasma; normal ears, nose and oropharynx Neck: normal jugular venous pulsations and no hepatojugular reflux; brisk carotid pulses without delay and no carotid bruits Chest: clear to auscultation, no signs of consolidation by percussion or palpation, normal fremitus, symmetrical and full respiratory excursions Cardiovascular: normal position and quality of the apical impulse, irregular rhythm, normal first and second heart sounds, no rubs or gallops, no murmur Abdomen: no tenderness or distention, no masses by palpation, no abnormal pulsatility or arterial bruits, normal bowel sounds, no hepatosplenomegaly Extremities: no clubbing, cyanosis or  edema; 2+ radial, ulnar and brachial pulses bilaterally; 2+ right femoral, posterior tibial and dorsalis pedis pulses; 2+ left femoral, posterior tibial and dorsalis pedis pulses; no subclavian or femoral bruits Neurological: grossly nonfocal  LABS  CBC  Recent Labs  07/16/13 1335 07/17/13 0720  WBC 7.2 6.7  HGB 14.2 14.2  HCT 41.4 41.2  MCV 87.7 87.7  PLT 166 161   Basic Metabolic Panel  Recent Labs  07/16/13 1335 07/17/13 0720  NA 140 141  K 4.3 3.8  CL 108 104  CO2 18* 21  GLUCOSE 105* 104*  BUN 11 13  CREATININE 0.65 0.67  CALCIUM 9.3 9.5   Liver Function Tests No results found for this basename: AST, ALT, ALKPHOS, BILITOT, PROT, ALBUMIN,  in the last 72 hours No results found for this basename: LIPASE, AMYLASE,  in the last 72 hours Cardiac Enzymes  Recent Labs  07/16/13 1600 07/16/13 1825 07/17/13 0111  TROPONINI <0.30 <0.30 <0.30   BNP No components found with this basename: POCBNP,  D-Dimer No results found for this basename: DDIMER,  in the last 72 hours Hemoglobin A1C  Recent Labs  07/16/13 1825  HGBA1C 5.5   Fasting Lipid Panel  Recent Labs  07/17/13 0720  CHOL 198  HDL 59  LDLCALC 125*  TRIG 69  CHOLHDL 3.4   Thyroid Function Tests  Recent Labs  07/16/13  1335  TSH 1.450    Radiology Studies Imaging results have been reviewed and Dg Chest Port 1 View  07/16/2013   CLINICAL DATA:  AFib with RVR  EXAM: PORTABLE CHEST - 1 VIEW  COMPARISON:  None.  FINDINGS: Chronic interstitial markings. Mild left basilar opacity, likely atelectasis. No pleural effusion or pneumothorax.  The heart is normal in size.  IMPRESSION: No evidence of acute cardiopulmonary disease.   Electronically Signed   By: Sriyesh  Krishnan M.D.   On: 07/16/2013 13:24    TELE AF  ECG AF, otherwise normal  ECHO - preliminary bedside review Dilated LV with global LV hypokinesis, EF 35%, severely dilated LA, moderate centrally directed MR (likely functional  MR)  ASSESSMENT AND PLAN Differential diagnosis includes multivessel CAD (angina on presentation) and needs coronary angio first. Her risk factor profile is not too bad and this could well be tachycardia related cardiomyopathy. Suspect MR is secondary, not the cause of her cardiomyopathy. If coronary angio is normal, schedule for TEE cardioversion in AM. Keep on IV heparin for now. Transition to NOAC if no coronary revascularization is performed.  The cardiac cath and possible PCI procedure has been fully reviewed with the patient and written informed consent has been obtained.    Sinjin Amero, MD, FACC [facility] [phone] office [phone] pager 07/17/2013 9:30 AM   

## 2013-07-17 NOTE — Consult Note (Signed)
ANTICOAGULATION CONSULT NOTE  Pharmacy Consult for Heparin Indication: atrial fibrillation, r/o ACS  Allergies  Allergen Reactions  . Azithromycin Diarrhea and Nausea And Vomiting    Patient Measurements: Height: 5\' 7"  (170.2 cm) Weight: 185 lb 13.6 oz (84.3 kg) IBW/kg (Calculated) : 61.6 Heparin Dosing Weight: 79kg  Vital Signs: Temp: 98 F (36.7 C) (06/04 0810) Temp src: Oral (06/04 0810) BP: 119/98 mmHg (06/04 1000) Pulse Rate: 100 (06/04 1000)  Labs:  Recent Labs  07/16/13 1335  07/16/13 1825 07/17/13 0005 07/17/13 0111 07/17/13 0720  HGB 14.2  --   --   --   --  14.2  HCT 41.4  --   --   --   --  41.2  PLT 166  --   --   --   --  161  LABPROT 13.4  --   --   --   --   --   INR 1.04  --   --   --   --   --   HEPARINUNFRC  --   --   --  0.36  --  0.40  CREATININE 0.65  --   --   --   --  0.67  TROPONINI  --   < > <0.30  --  <0.30 <0.30  < > = values in this interval not displayed.  Estimated Creatinine Clearance: 88.7 ml/min (by C-G formula based on Cr of 0.67).   Medical History: Past Medical History  Diagnosis Date  . Asthma   . Hyperlipemia   . Obesity   . Osteopenia   . Nephrolithiasis 2013    "no OR"  . Atrial fibrillation with rapid ventricular response 07/15/2013    admitted  . Hypothyroidism     "I was on synthroid for awhle" (07/16/2013)  . Depression     Medications:  No anticoagulants pta  Assessment: 53yof presents to the ED with chest pain and SOB, found to be in afib RVR (CHADSVASC=1). She continues on IV heparin which has been therapeutic x2 checks. No bleeding issues noted. Plan for cath tomorrow then possible cardioversion and transition to NOAC.   Goal of Therapy:  Heparin level 0.3-0.7 units/ml Monitor platelets by anticoagulation protocol: Yes   Plan:  1) Heparin drip at 1250 units/hr 2) Daily heparin level and CBC  Sheppard Coil PharmD., BCPS Clinical Pharmacist Pager [phone] 07/17/2013 10:25 AM

## 2013-07-17 NOTE — Progress Notes (Signed)
Patient Name: Kiara [name] Date of Encounter: 07/17/2013  Principal Problem:   Atrial fibrillation with RVR Active Problems:   Unstable angina   Obesity   Congestive dilated cardiomyopathy   Moderate mitral regurgitation   Length of Stay: 1  SUBJECTIVE  Feeling better. No angina or dyspnea. Only complaint is NTG related HA (NTG just stopped). Remains in AF with VR approx 100. Echo being done right now.  CURRENT MEDS . aspirin  324 mg Oral NOW   Or  . aspirin  300 mg Rectal NOW  . aspirin EC  81 mg Oral Daily  . atorvastatin  40 mg Oral q1800    OBJECTIVE   Intake/Output Summary (Last 24 hours) at 07/17/13 0930 Last data filed at 07/17/13 0747  Gross per 24 hour  Intake 327.26 ml  Output   3400 ml  Net -3072.74 ml   Filed Weights   07/16/13 1251 07/16/13 1700 07/17/13 0330  Weight: 189 lb (85.73 kg) 193 lb 5.5 oz (87.7 kg) 189 lb 9.5 oz (86 kg)    PHYSICAL EXAM Filed Vitals:   07/17/13 0715 07/17/13 0730 07/17/13 0745 07/17/13 0810  BP: 114/91 112/78 104/86 132/88  Pulse: 74 94  37  Temp:    98 F (36.7 C)  TempSrc:    Oral  Resp:  15  18  Height:      Weight:      SpO2: 93% 94%  92%   General: Alert, oriented x3, no distress Head: no evidence of trauma, PERRL, EOMI, no exophtalmos or lid lag, no myxedema, no xanthelasma; normal ears, nose and oropharynx Neck: normal jugular venous pulsations and no hepatojugular reflux; brisk carotid pulses without delay and no carotid bruits Chest: clear to auscultation, no signs of consolidation by percussion or palpation, normal fremitus, symmetrical and full respiratory excursions Cardiovascular: normal position and quality of the apical impulse, irregular rhythm, normal first and second heart sounds, no rubs or gallops, no murmur Abdomen: no tenderness or distention, no masses by palpation, no abnormal pulsatility or arterial bruits, normal bowel sounds, no hepatosplenomegaly Extremities: no clubbing, cyanosis or  edema; 2+ radial, ulnar and brachial pulses bilaterally; 2+ right femoral, posterior tibial and dorsalis pedis pulses; 2+ left femoral, posterior tibial and dorsalis pedis pulses; no subclavian or femoral bruits Neurological: grossly nonfocal  LABS  CBC  Recent Labs  07/16/13 1335 07/17/13 0720  WBC 7.2 6.7  HGB 14.2 14.2  HCT 41.4 41.2  MCV 87.7 87.7  PLT 166 161   Basic Metabolic Panel  Recent Labs  07/16/13 1335 07/17/13 0720  NA 140 141  K 4.3 3.8  CL 108 104  CO2 18* 21  GLUCOSE 105* 104*  BUN 11 13  CREATININE 0.65 0.67  CALCIUM 9.3 9.5   Liver Function Tests No results found for this basename: AST, ALT, ALKPHOS, BILITOT, PROT, ALBUMIN,  in the last 72 hours No results found for this basename: LIPASE, AMYLASE,  in the last 72 hours Cardiac Enzymes  Recent Labs  07/16/13 1600 07/16/13 1825 07/17/13 0111  TROPONINI <0.30 <0.30 <0.30   BNP No components found with this basename: POCBNP,  D-Dimer No results found for this basename: DDIMER,  in the last 72 hours Hemoglobin A1C  Recent Labs  07/16/13 1825  HGBA1C 5.5   Fasting Lipid Panel  Recent Labs  07/17/13 0720  CHOL 198  HDL 59  LDLCALC 125*  TRIG 69  CHOLHDL 3.4   Thyroid Function Tests  Recent Labs  07/16/13  1335  TSH 1.450    Radiology Studies Imaging results have been reviewed and Dg Chest Port 1 View  07/16/2013   CLINICAL DATA:  AFib with RVR  EXAM: PORTABLE CHEST - 1 VIEW  COMPARISON:  None.  FINDINGS: Chronic interstitial markings. Mild left basilar opacity, likely atelectasis. No pleural effusion or pneumothorax.  The heart is normal in size.  IMPRESSION: No evidence of acute cardiopulmonary disease.   Electronically Signed   By: Charline BillsSriyesh  Krishnan M.D.   On: 07/16/2013 13:24    TELE AF  ECG AF, otherwise normal  ECHO - preliminary bedside review Dilated LV with global LV hypokinesis, EF 35%, severely dilated LA, moderate centrally directed MR (likely functional  MR)  ASSESSMENT AND PLAN Differential diagnosis includes multivessel CAD (angina on presentation) and needs coronary angio first. Her risk factor profile is not too bad and this could well be tachycardia related cardiomyopathy. Suspect MR is secondary, not the cause of her cardiomyopathy. If coronary angio is normal, schedule for TEE cardioversion in AM. Keep on IV heparin for now. Transition to NOAC if no coronary revascularization is performed.  The cardiac cath and possible PCI procedure has been fully reviewed with the patient and written informed consent has been obtained.    Thurmon FairMihai Saja Bartolini, MD, [facility] Center[facility]ACC [facility] [phone] office [phone] pager 07/17/2013 9:30 AM

## 2013-07-17 NOTE — Progress Notes (Signed)
Pt reports 5 out of 10 headache - tylenol given per PRN order with minimal relief.  MD notified and additional PRN pain medication ordered and given per Croitoru, MD.  Pt sitting in bed - talking with family.  No questions or concerns at this time.

## 2013-07-17 NOTE — CV Procedure (Signed)
    Cardiac Catheterization Procedure Note  Name: Kiara [name] MRN: [id number] DOB: November 23, 1960  Procedure: Left Heart Cath, Selective Coronary Angiography  Indication: Cardiomyopathy, CHF   Procedural Details: The right wrist was prepped, draped, and anesthetized with 1% lidocaine. Using the modified Seldinger technique, a 5 French sheath was introduced into the right radial artery. 3 mg of verapamil was administered through the sheath, weight-based unfractionated heparin was administered intravenously. Standard Judkins catheters were used for selective coronary angiography. Catheter exchanges were performed over an exchange length guidewire. There were no immediate procedural complications. A TR band was used for radial hemostasis at the completion of the procedure.  The patient was transferred to the post catheterization recovery area for further monitoring.  Procedural Findings: Hemodynamics: AO 98/70 LV 98/15  Coronary angiography: Coronary dominance: right  Left mainstem: There is minimal stenosis in the proximal left mainstem. This is no more than 20%.  Left anterior descending (LAD): The LAD is a relatively small system. There is a twin LAD that terminates in the distal anterior wall. There are no significant stenoses.  Left circumflex (LCx): There is a large ramus intermedius branch with no stenosis. The circumflex has 20-30% ostial stenosis there is otherwise no significant disease noted.  Right coronary artery (RCA): Large, dominant vessel. The proximal, mid, and distal vessel had no obstructive disease. There is a large acute marginal branch no disease. The PDA and PLA branches have no disease.  Left ventriculography: Deferred  Final Conclusions:   1. Minor nonobstructive CAD 2. New diagnosis atrial fibrillation with a nonischemic cardiomyopathy (suspect tachycardia mediated)  Recommendations: Will start anticoagulation today. Will arrange TEE cardioversion  tomorrow.  Tonny Bollman 07/17/2013, 3:12 PM

## 2013-07-17 NOTE — Progress Notes (Signed)
ANTICOAGULATION CONSULT NOTE - Follow Up Consult  Pharmacy Consult for heparin  Indication: ACS/afib  No Known Allergies  Patient Measurements: Height: 5\' 7"  (170.2 cm) Weight: 193 lb 5.5 oz (87.7 kg) IBW/kg (Calculated) : 61.6 Heparin Dosing Weight:   Vital Signs: Temp: 98.7 F (37.1 C) (06/04 0000) Temp src: Oral (06/04 0000) BP: 122/88 mmHg (06/03 1940) Pulse Rate: 113 (06/03 1940)  Labs:  Recent Labs  07/16/13 1335 07/16/13 1600 07/16/13 1825 07/17/13 0005  HGB 14.2  --   --   --   HCT 41.4  --   --   --   PLT 166  --   --   --   LABPROT 13.4  --   --   --   INR 1.04  --   --   --   HEPARINUNFRC  --   --   --  0.36  CREATININE 0.65  --   --   --   TROPONINI  --  <0.30 <0.30  --     Estimated Creatinine Clearance: 90.3 ml/min (by C-G formula based on Cr of 0.65).   Medications:  Heparin at 1250 units/hr   Assessment: Heparin level 0.36 IU/ml with no bleeding noted.  Goal of Therapy:  Heparin level 0.3-0.7 units/ml Monitor platelets by anticoagulation protocol: Yes   Plan:  Continue at 1250 and f/u daily   Janice Coffin 07/17/2013,1:14 AM

## 2013-07-17 NOTE — Progress Notes (Signed)
Discussed timing of cardioversion with Dr Jens Som who is scheduled to do the patient's TEE tomorrow. Also discussed with Dr Johney Frame. Will plan on TEE tomorrow and then DCCV Saturday after adequate dosing of Eliquis to achieve steady state anticoagulation.   Tonny Bollman 07/17/2013 5:46 PM

## 2013-07-17 NOTE — Interval H&P Note (Signed)
History and Physical Interval Note:  07/17/2013 2:40 PM  Kiara [name]  has presented today for surgery, with the diagnosis of cp  The various methods of treatment have been discussed with the patient and family. After consideration of risks, benefits and other options for treatment, the patient has consented to  Procedure(s): LEFT HEART CATHETERIZATION WITH CORONARY ANGIOGRAM (N/A) as a surgical intervention .  The patient's history has been reviewed, patient examined, no change in status, stable for surgery.  I have reviewed the patient's chart and labs.  Questions were answered to the patient's satisfaction.    Cath Lab Visit (complete for each Cath Lab visit)  Clinical Evaluation Leading to the Procedure:   ACS: no  Non-ACS:    Anginal Classification: CCS III  Anti-ischemic medical therapy: No Therapy  Non-Invasive Test Results: No non-invasive testing performed  Prior CABG: No previous CABG       Tonny Bollman

## 2013-07-17 NOTE — Progress Notes (Signed)
Echocardiogram 2D Echocardiogram has been performed.  Kiara [name] 07/17/2013, 9:24 AM

## 2013-07-18 ENCOUNTER — Inpatient Hospital Stay (HOSPITAL_COMMUNITY): Payer: BC Managed Care – PPO | Admitting: Certified Registered Nurse Anesthetist

## 2013-07-18 ENCOUNTER — Encounter (HOSPITAL_COMMUNITY): Payer: BC Managed Care – PPO | Admitting: Certified Registered Nurse Anesthetist

## 2013-07-18 ENCOUNTER — Encounter (HOSPITAL_COMMUNITY)
Admission: EM | Disposition: A | Discharge status: Home / Self Care | Payer: Self-pay | Source: Home / Self Care | Attending: Cardiology

## 2013-07-18 ENCOUNTER — Encounter (HOSPITAL_COMMUNITY): Payer: Self-pay

## 2013-07-18 DIAGNOSIS — I4891 Unspecified atrial fibrillation: Secondary | ICD-10-CM

## 2013-07-18 DIAGNOSIS — I428 Other cardiomyopathies: Secondary | ICD-10-CM

## 2013-07-18 DIAGNOSIS — E669 Obesity, unspecified: Secondary | ICD-10-CM

## 2013-07-18 DIAGNOSIS — E785 Hyperlipidemia, unspecified: Secondary | ICD-10-CM

## 2013-07-18 DIAGNOSIS — I059 Rheumatic mitral valve disease, unspecified: Secondary | ICD-10-CM

## 2013-07-18 HISTORY — PX: TEE WITHOUT CARDIOVERSION: SHX5443

## 2013-07-18 LAB — CBC
HCT: 43.1 % (ref 36.0–46.0)
Hemoglobin: 14.8 g/dL (ref 12.0–15.0)
MCH: 30.3 pg (ref 26.0–34.0)
MCHC: 34.3 g/dL (ref 30.0–36.0)
MCV: 88.1 fL (ref 78.0–100.0)
Platelets: 156 10*3/uL (ref 150–400)
RBC: 4.89 MIL/uL (ref 3.87–5.11)
RDW: 13.3 % (ref 11.5–15.5)
WBC: 8 10*3/uL (ref 4.0–10.5)

## 2013-07-18 SURGERY — ECHOCARDIOGRAM, TRANSESOPHAGEAL
Anesthesia: Monitor Anesthesia Care

## 2013-07-18 MED ORDER — FENTANYL CITRATE 0.05 MG/ML IJ SOLN
INTRAMUSCULAR | Status: AC
  Filled 2013-07-18: qty 2

## 2013-07-18 MED ORDER — CARVEDILOL 3.125 MG PO TABS
3.1250 mg | ORAL_TABLET | Freq: Two times a day (BID) | ORAL | Status: DC
  Administered 2013-07-18 – 2013-07-19 (×3): 3.125 mg via ORAL
  Filled 2013-07-18 (×5): qty 1

## 2013-07-18 MED ORDER — BUTAMBEN-TETRACAINE-BENZOCAINE 2-2-14 % EX AERO
INHALATION_SPRAY | CUTANEOUS | Status: DC | PRN
  Administered 2013-07-18: 2 via TOPICAL

## 2013-07-18 MED ORDER — FENTANYL CITRATE 0.05 MG/ML IJ SOLN
INTRAMUSCULAR | Status: DC | PRN
  Administered 2013-07-18 (×2): 25 ug via INTRAVENOUS

## 2013-07-18 MED ORDER — LISINOPRIL 5 MG PO TABS
5.0000 mg | ORAL_TABLET | Freq: Every day | ORAL | Status: DC
  Administered 2013-07-18 – 2013-07-21 (×4): 5 mg via ORAL
  Filled 2013-07-18 (×4): qty 1

## 2013-07-18 MED ORDER — MIDAZOLAM HCL 5 MG/ML IJ SOLN
INTRAMUSCULAR | Status: AC
  Filled 2013-07-18: qty 2

## 2013-07-18 MED ORDER — DIPHENHYDRAMINE HCL 50 MG/ML IJ SOLN
INTRAMUSCULAR | Status: AC
  Filled 2013-07-18: qty 1

## 2013-07-18 MED ORDER — CARVEDILOL 3.125 MG PO TABS
3.1250 mg | ORAL_TABLET | Freq: Two times a day (BID) | ORAL | Status: DC
  Filled 2013-07-18 (×2): qty 1

## 2013-07-18 MED ORDER — MIDAZOLAM HCL 10 MG/2ML IJ SOLN
INTRAMUSCULAR | Status: DC | PRN
  Administered 2013-07-18 (×2): 2 mg via INTRAVENOUS

## 2013-07-18 NOTE — Progress Notes (Signed)
Patient Name: Kiara [name] Date of Encounter: 07/18/2013  Principal Problem:   Atrial fibrillation with RVR Active Problems:   Unstable angina   Obesity   Congestive dilated cardiomyopathy   Moderate mitral regurgitation   Length of Stay: 2  SUBJECTIVE  Feels well. Slept lying flat. No CAD by angio  CURRENT MEDS . apixaban  5 mg Oral BID  . atorvastatin  40 mg Oral q1800  . carvedilol  3.125 mg Oral BID WC  . lisinopril  5 mg Oral Daily  . sertraline  200 mg Oral Daily  . sodium chloride  3 mL Intravenous Q12H  . sodium chloride  3 mL Intravenous Q12H  Diltiazem IV  OBJECTIVE   Intake/Output Summary (Last 24 hours) at 07/18/13 0917 Last data filed at 07/18/13 0800  Gross per 24 hour  Intake 1119.82 ml  Output   2375 ml  Net -1255.18 ml   Filed Weights   07/17/13 0330 07/17/13 1018 07/18/13 0312  Weight: 189 lb 9.5 oz (86 kg) 185 lb 13.6 oz (84.3 kg) 187 lb 9.8 oz (85.1 kg)    PHYSICAL EXAM Filed Vitals:   07/18/13 0300 07/18/13 0312 07/18/13 0400 07/18/13 0749  BP: 105/73  103/84 110/79  Pulse: 87  81 94  Temp:  98.2 F (36.8 C)  98.5 F (36.9 C)  TempSrc:  Oral  Oral  Resp:      Height:      Weight:  187 lb 9.8 oz (85.1 kg)    SpO2: 96%  94% 95%   General: Alert, oriented x3, no distress Head: no evidence of trauma, PERRL, EOMI, no exophtalmos or lid lag, no myxedema, no xanthelasma; normal ears, nose and oropharynx Neck: normal jugular venous pulsations and no hepatojugular reflux; brisk carotid pulses without delay and no carotid bruits Chest: clear to auscultation, no signs of consolidation by percussion or palpation, normal fremitus, symmetrical and full respiratory excursions Cardiovascular: normal position and quality of the apical impulse, regular rhythm, normal first and second heart sounds, no rubs or gallops, 1/6 apical holosystolic murmur Abdomen: no tenderness or distention, no masses by palpation, no abnormal pulsatility or arterial  bruits, normal bowel sounds, no hepatosplenomegaly Extremities: no clubbing, cyanosis or edema; 2+ radial, ulnar and brachial pulses bilaterally; 2+ right femoral, posterior tibial and dorsalis pedis pulses; 2+ left femoral, posterior tibial and dorsalis pedis pulses; no subclavian or femoral bruits Neurological: grossly nonfocal  LABS  CBC  Recent Labs  07/17/13 0720 07/18/13 0452  WBC 6.7 8.0  HGB 14.2 14.8  HCT 41.2 43.1  MCV 87.7 88.1  PLT 161 156   Basic Metabolic Panel  Recent Labs  07/16/13 1335 07/17/13 0720  NA 140 141  K 4.3 3.8  CL 108 104  CO2 18* 21  GLUCOSE 105* 104*  BUN 11 13  CREATININE 0.65 0.67  CALCIUM 9.3 9.5   Liver Function Tests No results found for this basename: AST, ALT, ALKPHOS, BILITOT, PROT, ALBUMIN,  in the last 72 hours No results found for this basename: LIPASE, AMYLASE,  in the last 72 hours Cardiac Enzymes  Recent Labs  07/16/13 1825 07/17/13 0111 07/17/13 0720  TROPONINI <0.30 <0.30 <0.30   BNP No components found with this basename: POCBNP,  D-Dimer No results found for this basename: DDIMER,  in the last 72 hours Hemoglobin A1C  Recent Labs  07/16/13 1825  HGBA1C 5.5   Fasting Lipid Panel  Recent Labs  07/17/13 0720  CHOL 198  HDL 59    LDLCALC 125*  TRIG 69  CHOLHDL 3.4   Thyroid Function Tests  Recent Labs  07/16/13 1335  TSH 1.450    Radiology Studies Imaging results have been reviewed and Dg Chest Port 1 View  07/16/2013   CLINICAL DATA:  AFib with RVR  EXAM: PORTABLE CHEST - 1 VIEW  COMPARISON:  None.  FINDINGS: Chronic interstitial markings. Mild left basilar opacity, likely atelectasis. No pleural effusion or pneumothorax.  The heart is normal in size.  IMPRESSION: No evidence of acute cardiopulmonary disease.   Electronically Signed   By: Charline Bills M.D.   On: 07/16/2013 13:24    TELE AF with controlled rate  ECG AF  ASSESSMENT AND PLAN  Acute systolic heart failure, resolved. Not  receiving scheduled loop diuretics. Dilated cardiomyopathy - idiopathic versus (hopefully) tachycardia-related. Appears clinically euvolemic. Started on ACEi and carvedilol. Plan to wean off diltiazem as heart rate allows. Atrial fibrillation - has been on IV heparin since admission, Eliquis started yesterday, 3rd dose in AM. Plan for TEE today with Dr. Jens Som and tentatively scheduled for cardioversion on Saturday with Dr. Johney Frame. Well rate controlled. Depending on response to cardioversion and evolution of LVEF may need antiarrhythmic therapy or RF ablation in the future. Mitral insufficiency - moderate, likely functional Mild hyperlipidemia, now on statin  Thurmon Fair, MD, [facility] [phone] office [phone] pager 07/18/2013 9:17 AM

## 2013-07-18 NOTE — Progress Notes (Signed)
Echocardiogram Echocardiogram Transesophageal has been performed.  Kiara [name] Kiara [name] 07/18/2013, 2:05 PM

## 2013-07-18 NOTE — Interval H&P Note (Signed)
History and Physical Interval Note:  07/18/2013 1:34 PM  Kiara [name]  has presented today for surgery, with the diagnosis of afib  The various methods of treatment have been discussed with the patient and family. After consideration of risks, benefits and other options for treatment, the patient has consented to  Procedure(s): TRANSESOPHAGEAL ECHOCARDIOGRAM (TEE) (N/A) CARDIOVERSION (N/A) as a surgical intervention .  The patient's history has been reviewed, patient examined, no change in status, stable for surgery.  I have reviewed the patient's chart and labs.  Questions were answered to the patient's satisfaction.     Lewayne Bunting

## 2013-07-18 NOTE — H&P (View-Only) (Signed)
Patient Name: Kiara [name] Date of Encounter: 07/18/2013  Principal Problem:   Atrial fibrillation with RVR Active Problems:   Unstable angina   Obesity   Congestive dilated cardiomyopathy   Moderate mitral regurgitation   Length of Stay: 2  SUBJECTIVE  Feels well. Slept lying flat. No CAD by angio  CURRENT MEDS . apixaban  5 mg Oral BID  . atorvastatin  40 mg Oral q1800  . carvedilol  3.125 mg Oral BID WC  . lisinopril  5 mg Oral Daily  . sertraline  200 mg Oral Daily  . sodium chloride  3 mL Intravenous Q12H  . sodium chloride  3 mL Intravenous Q12H  Diltiazem IV  OBJECTIVE   Intake/Output Summary (Last 24 hours) at 07/18/13 0917 Last data filed at 07/18/13 0800  Gross per 24 hour  Intake 1119.82 ml  Output   2375 ml  Net -1255.18 ml   Filed Weights   07/17/13 0330 07/17/13 1018 07/18/13 0312  Weight: 189 lb 9.5 oz (86 kg) 185 lb 13.6 oz (84.3 kg) 187 lb 9.8 oz (85.1 kg)    PHYSICAL EXAM Filed Vitals:   07/18/13 0300 07/18/13 0312 07/18/13 0400 07/18/13 0749  BP: 105/73  103/84 110/79  Pulse: 87  81 94  Temp:  98.2 F (36.8 C)  98.5 F (36.9 C)  TempSrc:  Oral  Oral  Resp:      Height:      Weight:  187 lb 9.8 oz (85.1 kg)    SpO2: 96%  94% 95%   General: Alert, oriented x3, no distress Head: no evidence of trauma, PERRL, EOMI, no exophtalmos or lid lag, no myxedema, no xanthelasma; normal ears, nose and oropharynx Neck: normal jugular venous pulsations and no hepatojugular reflux; brisk carotid pulses without delay and no carotid bruits Chest: clear to auscultation, no signs of consolidation by percussion or palpation, normal fremitus, symmetrical and full respiratory excursions Cardiovascular: normal position and quality of the apical impulse, regular rhythm, normal first and second heart sounds, no rubs or gallops, 1/6 apical holosystolic murmur Abdomen: no tenderness or distention, no masses by palpation, no abnormal pulsatility or arterial  bruits, normal bowel sounds, no hepatosplenomegaly Extremities: no clubbing, cyanosis or edema; 2+ radial, ulnar and brachial pulses bilaterally; 2+ right femoral, posterior tibial and dorsalis pedis pulses; 2+ left femoral, posterior tibial and dorsalis pedis pulses; no subclavian or femoral bruits Neurological: grossly nonfocal  LABS  CBC  Recent Labs  07/17/13 0720 07/18/13 0452  WBC 6.7 8.0  HGB 14.2 14.8  HCT 41.2 43.1  MCV 87.7 88.1  PLT 161 156   Basic Metabolic Panel  Recent Labs  07/16/13 1335 07/17/13 0720  NA 140 141  K 4.3 3.8  CL 108 104  CO2 18* 21  GLUCOSE 105* 104*  BUN 11 13  CREATININE 0.65 0.67  CALCIUM 9.3 9.5   Liver Function Tests No results found for this basename: AST, ALT, ALKPHOS, BILITOT, PROT, ALBUMIN,  in the last 72 hours No results found for this basename: LIPASE, AMYLASE,  in the last 72 hours Cardiac Enzymes  Recent Labs  07/16/13 1825 07/17/13 0111 07/17/13 0720  TROPONINI <0.30 <0.30 <0.30   BNP No components found with this basename: POCBNP,  D-Dimer No results found for this basename: DDIMER,  in the last 72 hours Hemoglobin A1C  Recent Labs  07/16/13 1825  HGBA1C 5.5   Fasting Lipid Panel  Recent Labs  07/17/13 0720  CHOL 198  HDL 59  LDLCALC 125*  TRIG 69  CHOLHDL 3.4   Thyroid Function Tests  Recent Labs  07/16/13 1335  TSH 1.450    Radiology Studies Imaging results have been reviewed and Dg Chest Port 1 View  07/16/2013   CLINICAL DATA:  AFib with RVR  EXAM: PORTABLE CHEST - 1 VIEW  COMPARISON:  None.  FINDINGS: Chronic interstitial markings. Mild left basilar opacity, likely atelectasis. No pleural effusion or pneumothorax.  The heart is normal in size.  IMPRESSION: No evidence of acute cardiopulmonary disease.   Electronically Signed   By: Charline Bills M.D.   On: 07/16/2013 13:24    TELE AF with controlled rate  ECG AF  ASSESSMENT AND PLAN  Acute systolic heart failure, resolved. Not  receiving scheduled loop diuretics. Dilated cardiomyopathy - idiopathic versus (hopefully) tachycardia-related. Appears clinically euvolemic. Started on ACEi and carvedilol. Plan to wean off diltiazem as heart rate allows. Atrial fibrillation - has been on IV heparin since admission, Eliquis started yesterday, 3rd dose in AM. Plan for TEE today with Dr. Jens Som and tentatively scheduled for cardioversion on Saturday with Dr. Johney Frame. Well rate controlled. Depending on response to cardioversion and evolution of LVEF may need antiarrhythmic therapy or RF ablation in the future. Mitral insufficiency - moderate, likely functional Mild hyperlipidemia, now on statin  Thurmon Fair, MD, [facility] [phone] office [phone] pager 07/18/2013 9:17 AM

## 2013-07-18 NOTE — Discharge Instructions (Addendum)
Information on my medicine - ELIQUIS (apixaban)  This medication education was reviewed with me or my healthcare representative as part of my discharge preparation.  The pharmacist that spoke with me during my hospital stay was:  Severiano GilbertFrank Rhea Wilson, [facility]  Why was Eliquis prescribed for you? Eliquis was prescribed for you to reduce the risk of a blood clot forming that can cause a stroke if you have a medical condition called atrial fibrillation (a type of irregular heartbeat).  What do You need to know about Eliquis ? Take your Eliquis TWICE DAILY - one tablet in the morning and one tablet in the evening with or without food. If you have difficulty swallowing the tablet whole please discuss with your pharmacist how to take the medication safely.  Take Eliquis exactly as prescribed by your doctor and DO NOT stop taking Eliquis without talking to the doctor who prescribed the medication.  Stopping may increase your risk of developing a stroke.  Refill your prescription before you run out.  After discharge, you should have regular check-up appointments with your healthcare provider that is prescribing your Eliquis.  In the future your dose may need to be changed if your kidney function or weight changes by a significant amount or as you get older.  What do you do if you miss a dose? If you miss a dose, take it as soon as you remember on the same day and resume taking twice daily.  Do not take more than one dose of ELIQUIS at the same time to make up a missed dose.  Important Safety Information A possible side effect of Eliquis is bleeding. You should call your healthcare provider right away if you experience any of the following:   Bleeding from an injury or your nose that does not stop.   Unusual colored urine (red or dark brown) or unusual colored stools (red or black).   Unusual bruising for unknown reasons.   A serious fall or if you hit your head (even if there is no bleeding).  Some  medicines may interact with Eliquis and might increase your risk of bleeding or clotting while on Eliquis. To help avoid this, consult your healthcare provider or pharmacist prior to using any new prescription or non-prescription medications, including herbals, vitamins, non-steroidal anti-inflammatory drugs (NSAIDs) and supplements.  This website has more information on Eliquis (apixaban): [email or web address]. Atrial Fibrillation Atrial fibrillation is a type of irregular heart rhythm (arrhythmia). During atrial fibrillation, the upper chambers of the heart (atria) quiver continuously in a chaotic pattern. This causes an irregular and often rapid heart rate.  Atrial fibrillation is the result of the heart becoming overloaded with disorganized signals that tell it to beat. These signals are normally released one at a time by a part of the right atrium called the sinoatrial node. They then travel from the atria to the lower chambers of the heart (ventricles), causing the atria and ventricles to contract and pump blood as they pass. In atrial fibrillation, parts of the atria outside of the sinoatrial node also release these signals. This results in two problems. First, the atria receive so many signals that they do not have time to fully contract. Second, the ventricles, which can only receive one signal at a time, beat irregularly and out of rhythm with the atria.  There are three types of atrial fibrillation:   Paroxysmal Paroxysmal atrial fibrillation starts suddenly and stops on its own within a week.   Persistent Persistent atrial fibrillation lasts  for more than a week. It may stop on its own or with treatment.   Permanent Permanent atrial fibrillation does not go away. Episodes of atrial fibrillation may lead to permanent atrial fibrillation.  Atrial fibrillation can prevent your heart from pumping blood normally. It increases your risk of stroke and can lead to heart failure.  CAUSES   Heart  conditions, including a heart attack, heart failure, coronary artery disease, and heart valve conditions.   Inflammation of the sac that surrounds the heart (pericarditis).   Blockage of an artery in the lungs (pulmonary embolism).   Pneumonia or other infections.   Chronic lung disease.   Thyroid problems, especially if the thyroid is overactive (hyperthyroidism).   Caffeine, excessive alcohol use, and use of some illegal drugs.   Use of some medications, including certain decongestants and diet pills.   Heart surgery.   Birth defects.  Sometimes, no cause can be found. When this happens, the atrial fibrillation is called lone atrial fibrillation. The risk of complications from atrial fibrillation increases if you have lone atrial fibrillation and you are age 55 years or older. RISK FACTORS  Heart failure.  Coronary artery disease  Diabetes mellitus.   High blood pressure (hypertension).   Obesity.   Other arrhythmias.   Increased age. SYMPTOMS   A feeling that your heart is beating rapidly or irregularly.   A feeling of discomfort or pain in your chest.   Shortness of breath.   Sudden lightheadedness or weakness.   Getting tired easily when exercising.   Urinating more often than normal (mainly when atrial fibrillation first begins).  In paroxysmal atrial fibrillation, symptoms may start and suddenly stop. DIAGNOSIS  Your caregiver may be able to detect atrial fibrillation when taking your pulse. Usually, testing is needed to diagnosis atrial fibrillation. Tests may include:   Electrocardiography. During this test, the electrical impulses of your heart are recorded while you are lying down.   Echocardiography. During echocardiography, sound waves are used to evaluate how blood flows through your heart.   Stress test. There is more than one type of stress test. If a stress test is needed, ask your caregiver about which type is best for  you.   Chest X-ray exam.   Blood tests.   Computed tomography (CT).  TREATMENT   Treating any underlying conditions. For example, if you have an overactive thyroid, treating the condition may correct atrial fibrillation.   Medication. Medications may be given to control a rapid heart rate or to prevent blood clots, heart failure, or a stroke.   Procedure to correct the rhythm of the heart:  Electrical cardioversion. During electrical cardioversion, a controlled, low-energy shock is delivered to the heart through your skin. If you have chest pain, very low pressure blood pressure, or sudden heart failure, this procedure may need to be done as an emergency.  Catheter ablation. During this procedure, heart tissues that send the signals that cause atrial fibrillation are destroyed.  Maze or minimaze procedure. During this surgery, thin lines of heart tissue that carry the abnormal signals are destroyed. The maze procedure is an open-heart surgery. The minimaze procedure is a minimally invasive surgery. This means that small cuts are made to access the heart instead of a large opening.  Pulmonary venous isolation. During this surgery, tissue around the veins that carry blood from the lungs (pulmonary veins) is destroyed. This tissue is thought to carry the abnormal signals. HOME CARE INSTRUCTIONS   Take medications as directed  by your caregiver.  Only take medications that your caregiver approves. Some medications can make atrial fibrillation worse or recur.  If blood thinners were prescribed by your caregiver, take them exactly as directed. Too much can cause bleeding. Too little and you will not have the needed protection against stroke and other problems.  Perform blood tests at home if directed by your caregiver.  Perform blood tests exactly as directed.   Quit smoking if you smoke.   Do not drink alcohol.   Do not drink caffeinated beverages such as coffee, soda, and  some teas. You may drink decaffeinated coffee, soda, or tea.   Maintain a healthy weight. Do not use diet pills unless your caregiver approves. They may make heart problems worse.   Follow diet instructions as directed by your caregiver.   Exercise regularly as directed by your caregiver.   Keep all follow-up appointments. PREVENTION  The following substances can cause atrial fibrillation to recur:   Caffeinated beverages.   Alcohol.   Certain medications, especially those used for breathing problems.   Certain herbs and herbal medications, such as those containing ephedra or ginseng.  Illegal drugs such as cocaine and amphetamines. Sometimes medications are given to prevent atrial fibrillation from recurring. Proper treatment of any underlying condition is also important in helping prevent recurrence.  SEEK MEDICAL CARE IF:  You notice a change in the rate, rhythm, or strength of your heartbeat.   You suddenly begin urinating more frequently.   You tire more easily when exerting yourself or exercising.  SEEK IMMEDIATE MEDICAL CARE IF:   You develop chest pain, abdominal pain, sweating, or weakness.  You feel sick to your stomach (nauseous).  You develop shortness of breath.  You suddenly develop swollen feet and ankles.  You feel dizzy.  You face or limbs feel numb or weak.  There is a change in your vision or speech. MAKE SURE YOU:   Understand these instructions.  Will watch your condition.  Will get help right away if you are not doing well or get worse. Document Released: 01/30/2005 Document Revised: 05/27/2012 Document Reviewed: 03/12/2012 [facility] Patient Information 2014 [place], [place].

## 2013-07-18 NOTE — CV Procedure (Signed)
See full TEE report in [employer]; possible small thrombus LAA tip; suggest 3 weeks anticoagulation and then proceed with DCCV. Kiara [name]

## 2013-07-19 LAB — CBC
HCT: 42.8 % (ref 36.0–46.0)
Hemoglobin: 14.1 g/dL (ref 12.0–15.0)
MCH: 28.9 pg (ref 26.0–34.0)
MCHC: 32.9 g/dL (ref 30.0–36.0)
MCV: 87.7 fL (ref 78.0–100.0)
Platelets: 157 10*3/uL (ref 150–400)
RBC: 4.88 MIL/uL (ref 3.87–5.11)
RDW: 13.2 % (ref 11.5–15.5)
WBC: 8.1 10*3/uL (ref 4.0–10.5)

## 2013-07-19 LAB — BASIC METABOLIC PANEL
BUN: 13 mg/dL (ref 6–23)
CO2: 22 mEq/L (ref 19–32)
Calcium: 9.4 mg/dL (ref 8.4–10.5)
Chloride: 105 mEq/L (ref 96–112)
Creatinine, Ser: 0.7 mg/dL (ref 0.50–1.10)
GFR calc Af Amer: >90 mL/min (ref >90)
GFR calc non Af Amer: >90 mL/min (ref >90)
Glucose, Bld: 94 mg/dL (ref 70–99)
Potassium: 3.6 mEq/L — ABNORMAL LOW (ref 3.7–5.3)
Sodium: 141 mEq/L (ref 137–147)

## 2013-07-19 LAB — TROPONIN I: Troponin I: <0.3 ng/mL (ref <0.30)

## 2013-07-19 MED ORDER — CARVEDILOL 12.5 MG PO TABS
12.5000 mg | ORAL_TABLET | Freq: Two times a day (BID) | ORAL | Status: DC
  Administered 2013-07-19 – 2013-07-20 (×3): 12.5 mg via ORAL
  Filled 2013-07-19 (×4): qty 1

## 2013-07-19 NOTE — Progress Notes (Signed)
Patient Name: Kiara [name] Date of Encounter: 07/19/2013  Principal Problem:   Atrial fibrillation with RVR Active Problems:   Unstable angina   Obesity   Congestive dilated cardiomyopathy   Moderate mitral regurgitation   Length of Stay: 3  SUBJECTIVE  TEE suspicious for LA appendage thrombus. DCCV deferred Feels great. No dyspnea walking to bathroom. HR around 100.  CURRENT MEDS . apixaban  5 mg Oral BID  . atorvastatin  40 mg Oral q1800  . carvedilol  12.5 mg Oral BID WC  . lisinopril  5 mg Oral Daily  . sertraline  200 mg Oral Daily  . sodium chloride  3 mL Intravenous Q12H    OBJECTIVE   Intake/Output Summary (Last 24 hours) at 07/19/13 0846 Last data filed at 07/19/13 0800  Gross per 24 hour  Intake    680 ml  Output      0 ml  Net    680 ml   Filed Weights   07/17/13 1018 07/18/13 0312 07/19/13 0500  Weight: 185 lb 13.6 oz (84.3 kg) 187 lb 9.8 oz (85.1 kg) 187 lb 14.4 oz (85.231 kg)    PHYSICAL EXAM Filed Vitals:   07/18/13 2355 07/19/13 0339 07/19/13 0500 07/19/13 0755  BP:  89/54  103/60  Pulse:  86    Temp: 98 F (36.7 C) 97.7 F (36.5 C)  97.8 F (36.6 C)  TempSrc: Oral Oral  Oral  Resp:    17  Height:      Weight:   187 lb 14.4 oz (85.231 kg)   SpO2: 97% 97%  96%   General: Alert, oriented x3, no distress Head: no evidence of trauma, PERRL, EOMI, no exophtalmos or lid lag, no myxedema, no xanthelasma; normal ears, nose and oropharynx Neck: normal jugular venous pulsations and no hepatojugular reflux; brisk carotid pulses without delay and no carotid bruits Chest: clear to auscultation, no signs of consolidation by percussion or palpation, normal fremitus, symmetrical and full respiratory excursions Cardiovascular: normal position and quality of the apical impulse, irregular rhythm, normal first and second heart sounds, no rubs or gallops, 1/6 holosystolic murmur Abdomen: no tenderness or distention, no masses by palpation, no abnormal  pulsatility or arterial bruits, normal bowel sounds, no hepatosplenomegaly Extremities: no clubbing, cyanosis or edema; 2+ radial, ulnar and brachial pulses bilaterally; 2+ right femoral, posterior tibial and dorsalis pedis pulses; 2+ left femoral, posterior tibial and dorsalis pedis pulses; no subclavian or femoral bruits Neurological: grossly nonfocal  LABS  CBC  Recent Labs  07/18/13 0452 07/19/13 0308  WBC 8.0 8.1  HGB 14.8 14.1  HCT 43.1 42.8  MCV 88.1 87.7  PLT 156 157   Basic Metabolic Panel  Recent Labs  07/17/13 0720 07/19/13 0308  NA 141 141  K 3.8 3.6*  CL 104 105  CO2 21 22  GLUCOSE 104* 94  BUN 13 13  CREATININE 0.67 0.70  CALCIUM 9.5 9.4   Liver Function Tests No results found for this basename: AST, ALT, ALKPHOS, BILITOT, PROT, ALBUMIN,  in the last 72 hours No results found for this basename: LIPASE, AMYLASE,  in the last 72 hours Cardiac Enzymes  Recent Labs  07/17/13 0111 07/17/13 0720 07/19/13 0308  TROPONINI <0.30 <0.30 <0.30   BNP No components found with this basename: POCBNP,  D-Dimer No results found for this basename: DDIMER,  in the last 72 hours Hemoglobin A1C  Recent Labs  07/16/13 1825  HGBA1C 5.5   Fasting Lipid Panel  Recent Labs  07/17/13 0720  CHOL 198  HDL 59  LDLCALC 125*  TRIG 69  CHOLHDL 3.4   Thyroid Function Tests  Recent Labs  07/16/13 1335  TSH 1.450    Radiology Studies Imaging results have been reviewed and No results found.  TELE AF RVR  ECG No change  ASSESSMENT AND PLAN Acute systolic heart failure, resolved. Not receiving scheduled loop diuretics.  Dilated cardiomyopathy - idiopathic versus (hopefully) tachycardia-related. Appears clinically euvolemic. Started on ACEi and carvedilol. Stop diltiazem today.  Atrial fibrillation - has been on IV heparin since admission, Eliquis started yesterday, plan repeat TEE cardioversion oin 3 weeks. Well rate controlled. Depending on response to  cardioversion and evolution of LVEF may need antiarrhythmic therapy or RF ablation in the future.  Mitral insufficiency - moderate, likely functional  Mild hyperlipidemia, now on statin  Hopefully will achieve good rate control with oral agents and be ready for DC in next 24 hours.  Thurmon FairMihai Symiah Nowotny, MD, [facility] [phone] office [phone] pager 07/19/2013 8:46 AM

## 2013-07-19 NOTE — Progress Notes (Signed)
Pts HR noted to be increased in the 110-140's.  Dr. Antoine Poche notified.  No new orders. Will cont to monitor. Mykelle Cockerell Hope Ladaysha Soutar

## 2013-07-20 LAB — CBC
HCT: 42.1 % (ref 36.0–46.0)
Hemoglobin: 13.8 g/dL (ref 12.0–15.0)
MCH: 28.9 pg (ref 26.0–34.0)
MCHC: 32.8 g/dL (ref 30.0–36.0)
MCV: 88.1 fL (ref 78.0–100.0)
Platelets: 138 10*3/uL — ABNORMAL LOW (ref 150–400)
RBC: 4.78 MIL/uL (ref 3.87–5.11)
RDW: 13.2 % (ref 11.5–15.5)
WBC: 8.8 10*3/uL (ref 4.0–10.5)

## 2013-07-20 MED ORDER — DIGOXIN 250 MCG PO TABS
0.2500 mg | ORAL_TABLET | Freq: Every day | ORAL | Status: DC
  Administered 2013-07-21: 0.25 mg via ORAL
  Filled 2013-07-20: qty 1

## 2013-07-20 MED ORDER — METOPROLOL TARTRATE 50 MG PO TABS
50.0000 mg | ORAL_TABLET | Freq: Two times a day (BID) | ORAL | Status: DC
  Administered 2013-07-20 – 2013-07-21 (×2): 50 mg via ORAL
  Filled 2013-07-20 (×3): qty 1

## 2013-07-20 MED ORDER — DIGOXIN 250 MCG PO TABS
0.2500 mg | ORAL_TABLET | ORAL | Status: AC
  Administered 2013-07-20 (×3): 0.25 mg via ORAL
  Filled 2013-07-20 (×4): qty 1

## 2013-07-20 NOTE — Progress Notes (Signed)
Nursing: Observed patient's heart rate during the night . At various times her heart rate would increase to 125-150's with activities such as getting out of bed , sitting in the chair and going to the bathroom. Her heart rate was also elevated when family visited. While she slept her heart rate was in the high 90's to low 100's. Patient voiced concerns about her newly diagnosis atrial fib , how it would impact her life and other issues. The "Off the Beat" book was at bedside. Encouraged patient that what she was experiencing was normal. Actively listen to her concerns and offered reassurance.

## 2013-07-20 NOTE — Progress Notes (Signed)
Patient Name: Kiara GowdaJan O Leever Date of Encounter: 07/20/2013  Principal Problem:   Atrial fibrillation with RVR Active Problems:   Unstable angina   Obesity   Congestive dilated cardiomyopathy   Moderate mitral regurgitation   Length of Stay: 4  SUBJECTIVE  Feels well. Walked unit twice, a little dizzy the second time. No dyspnea. Inadequate rate control. HR around 120 at rest, 140 with activity.  CURRENT MEDS . apixaban  5 mg Oral BID  . atorvastatin  40 mg Oral q1800  . carvedilol  12.5 mg Oral BID WC  . digoxin  0.25 mg Oral Q2H  . [START ON 07/21/2013] digoxin  0.25 mg Oral Daily  . lisinopril  5 mg Oral Daily  . sertraline  200 mg Oral Daily  . sodium chloride  3 mL Intravenous Q12H    OBJECTIVE   Intake/Output Summary (Last 24 hours) at 07/20/13 0936 Last data filed at 07/20/13 0900  Gross per 24 hour  Intake    490 ml  Output      0 ml  Net    490 ml   Filed Weights   07/18/13 0312 07/19/13 0500 07/20/13 0300  Weight: 187 lb 9.8 oz (85.1 kg) 187 lb 14.4 oz (85.231 kg) 188 lb 11.2 oz (85.594 kg)    PHYSICAL EXAM Filed Vitals:   07/19/13 2012 07/19/13 2302 07/20/13 0300 07/20/13 0818  BP: 116/66 102/70 113/83 92/54  Pulse: 115 139 87   Temp:  98.4 F (36.9 C) 98.3 F (36.8 C) 98.1 F (36.7 C)  TempSrc:  Oral Oral Oral  Resp: 20 20 18 19   Height:      Weight:   188 lb 11.2 oz (85.594 kg)   SpO2: 98% 98% 99% 100%   General: Alert, oriented x3, no distress Head: no evidence of trauma, PERRL, EOMI, no exophtalmos or lid lag, no myxedema, no xanthelasma; normal ears, nose and oropharynx Neck: normal jugular venous pulsations and no hepatojugular reflux; brisk carotid pulses without delay and no carotid bruits Chest: clear to auscultation, no signs of consolidation by percussion or palpation, normal fremitus, symmetrical and full respiratory excursions Cardiovascular: normal position and quality of the apical impulse, irregular rhythm, normal first and second  heart sounds, no rubs or gallops, no murmur Abdomen: no tenderness or distention, no masses by palpation, no abnormal pulsatility or arterial bruits, normal bowel sounds, no hepatosplenomegaly Extremities: no clubbing, cyanosis or edema; 2+ radial, ulnar and brachial pulses bilaterally; 2+ right femoral, posterior tibial and dorsalis pedis pulses; 2+ left femoral, posterior tibial and dorsalis pedis pulses; no subclavian or femoral bruits Neurological: grossly nonfocal  LABS  CBC  Recent Labs  07/19/13 0308 07/20/13 0255  WBC 8.1 8.8  HGB 14.1 13.8  HCT 42.8 42.1  MCV 87.7 88.1  PLT 157 138*   Basic Metabolic Panel  Recent Labs  07/19/13 0308  NA 141  K 3.6*  CL 105  CO2 22  GLUCOSE 94  BUN 13  CREATININE 0.70  CALCIUM 9.4   Liver Function Tests No results found for this basename: AST, ALT, ALKPHOS, BILITOT, PROT, ALBUMIN,  in the last 72 hours No results found for this basename: LIPASE, AMYLASE,  in the last 72 hours Cardiac Enzymes  Recent Labs  07/19/13 0308  TROPONINI <0.30    Radiology Studies Imaging results have been reviewed and No results found.  TELE AF with RVR  ASSESSMENT AND PLAN Acute systolic heart failure, resolved. Not receiving scheduled loop diuretics.  Dilated cardiomyopathy -  idiopathic versus (hopefully) tachycardia-related. Appears clinically euvolemic. Started on ACEi and betablockers. Low BP precludes med titration Atrial fibrillation - On Eliquis, plan repeat TEE cardioversion in 3 weeks. poor rate control. Switch from carvedilol to metoprolol. Add digoxin. Depending on response to cardioversion and evolution of LVEF may need antiarrhythmic therapy or RF ablation in the future.  Mitral insufficiency - moderate, likely functional  Mild hyperlipidemia, now on statin    Thurmon Fair, MD, [facility] [phone] office [phone] pager 07/20/2013 9:36 AM

## 2013-07-21 ENCOUNTER — Encounter (HOSPITAL_COMMUNITY): Payer: Self-pay | Admitting: Cardiology

## 2013-07-21 DIAGNOSIS — Z8709 Personal history of other diseases of the respiratory system: Secondary | ICD-10-CM

## 2013-07-21 DIAGNOSIS — E785 Hyperlipidemia, unspecified: Secondary | ICD-10-CM | POA: Diagnosis present

## 2013-07-21 HISTORY — DX: Personal history of other diseases of the respiratory system: Z87.09

## 2013-07-21 LAB — CBC
HCT: 42.4 % (ref 36.0–46.0)
Hemoglobin: 14.1 g/dL (ref 12.0–15.0)
MCH: 29.4 pg (ref 26.0–34.0)
MCHC: 33.3 g/dL (ref 30.0–36.0)
MCV: 88.3 fL (ref 78.0–100.0)
Platelets: 150 10*3/uL (ref 150–400)
RBC: 4.8 MIL/uL (ref 3.87–5.11)
RDW: 13 % (ref 11.5–15.5)
WBC: 8.4 10*3/uL (ref 4.0–10.5)

## 2013-07-21 MED ORDER — ACETAMINOPHEN 325 MG PO TABS: 650.0000 mg | ORAL_TABLET | Freq: Four times a day (QID) | ORAL | Status: DC | PRN

## 2013-07-21 MED ORDER — DIGOXIN 250 MCG PO TABS: 0.2500 mg | ORAL_TABLET | Freq: Every day | ORAL | Status: DC

## 2013-07-21 MED ORDER — METOPROLOL TARTRATE 50 MG PO TABS
50.0000 mg | ORAL_TABLET | Freq: Two times a day (BID) | ORAL | Status: DC
Start: 2013-07-21 — End: 2013-08-26

## 2013-07-21 MED ORDER — APIXABAN 5 MG PO TABS: 5.0000 mg | ORAL_TABLET | Freq: Two times a day (BID) | ORAL | Status: DC

## 2013-07-21 MED ORDER — LISINOPRIL 5 MG PO TABS: 5.0000 mg | ORAL_TABLET | Freq: Every day | ORAL | Status: DC

## 2013-07-21 MED ORDER — ATORVASTATIN CALCIUM 40 MG PO TABS
40.0000 mg | ORAL_TABLET | Freq: Every day | ORAL | Status: DC
Start: 2013-07-21 — End: 2013-08-26

## 2013-07-21 NOTE — Progress Notes (Signed)
DC IV, DC Tele, DC Home. Discharge instructions and home medications discussed with patient and patient's family members. Patient and patient's family members denied any questions or concerns at this time. Patient leaving unit via wheelchair and appears in no acute distress.  

## 2013-07-21 NOTE — Progress Notes (Signed)
UR completed Kharson Rasmusson K. Ashrith Sagan, RN, BSN, MSHL, CCM  07/21/2013 8:06 PM

## 2013-07-21 NOTE — Discharge Summary (Signed)
Patient ID: Kiara GowdaJan O Ransdell,  MRN: [id number], DOB/AGE: January 25, 1961 53 y.o.  Admit date: 07/16/2013 Discharge date: 07/21/2013  Primary Care Provider: Dr Clelia CroftShaw Primary Cardiologist: Dr Shirlee LatchMcLean (new)  Discharge Diagnoses Principal Problem:   AF with RVR- LA clot on TEE 07/18/13 Active Problems:   Unstable angina- minor CAD at cath 07/17/13   Acute systolic CHF    Congestive dilated cardiomyopathy- EF 35%   Moderate mitral regurgitation   Dyslipidemia   History of asthma    Procedures: Cardiac Cath 07/17/13                        TEE 07/18/13   Hospital Course:  53 yo female with a history of HLD and asthma (on no medications), admitted 07/16/13 with chest pain suspicious for [place], AF with RVR, and dyspnea. BNP was 1600. Pt was admitted and put on medications for rate control and anticoagulation. She ruled out for an MI. Her EF was 35% by echo. She diuresed >3L. Cath done 07/17/13 showed minor CAD. TEE on 6/5 showed small LA clot. Plan is for 3 weeks of anticoagulation, then repeat TEE with CV.    Discharge Vitals:  Blood pressure 103/69, pulse 81, temperature 98.2 F (36.8 C), temperature source Oral, resp. rate 18, height 5\' 7"  (1.702 m), weight 84.278 kg (185 lb 12.8 oz), SpO2 96.00%.    Labs: Results for orders placed during the hospital encounter of 07/16/13 (from the past 48 hour(s))  CBC     Status: Abnormal   Collection Time    07/20/13  2:55 AM      Result Value Ref Range   WBC 8.8  4.0 - 10.5 K/uL   RBC 4.78  3.87 - 5.11 MIL/uL   Hemoglobin 13.8  12.0 - 15.0 g/dL   HCT 57.842.1  46.936.0 - 62.946.0 %   MCV 88.1  78.0 - 100.0 fL   MCH 28.9  26.0 - 34.0 pg   MCHC 32.8  30.0 - 36.0 g/dL   RDW 52.813.2  41.311.5 - 24.415.5 %   Platelets 138 (*) 150 - 400 K/uL  CBC     Status: None   Collection Time    07/21/13  4:03 AM      Result Value Ref Range   WBC 8.4  4.0 - 10.5 K/uL   RBC 4.80  3.87 - 5.11 MIL/uL   Hemoglobin 14.1  12.0 - 15.0 g/dL   HCT 01.042.4  27.236.0 - 53.646.0 %   MCV 88.3  78.0 - 100.0 fL   MCH 29.4  26.0 - 34.0 pg   MCHC 33.3  30.0 - 36.0 g/dL   RDW 64.413.0  03.411.5 - 74.215.5 %   Platelets 150  150 - 400 K/uL    Disposition:      Follow-up Information   Follow up with Tereso NewcomerScott Weaver, PA-C On 08/14/2013. (11:50 am)    Specialty:  Physician Assistant   Contact information:   [address] [place] [address] [phone]       Discharge Medications:    Medication List         acetaminophen 325 MG tablet  Commonly known as:  TYLENOL  Take 2 tablets (650 mg total) by mouth every 6 (six) hours as needed for mild pain.     apixaban 5 MG Tabs tablet  Commonly known as:  ELIQUIS  Take 1 tablet (5 mg total) by mouth 2 (two) times daily.  atorvastatin 40 MG tablet  Commonly known as:  LIPITOR  Take 1 tablet (40 mg total) by mouth daily at 6 PM.     digoxin 0.25 MG tablet  Commonly known as:  LANOXIN  Take 1 tablet (0.25 mg total) by mouth daily.     lisinopril 5 MG tablet  Commonly known as:  PRINIVIL,ZESTRIL  Take 1 tablet (5 mg total) by mouth daily.     metoprolol 50 MG tablet  Commonly known as:  LOPRESSOR  Take 1 tablet (50 mg total) by mouth 2 (two) times daily.     sertraline 100 MG tablet  Commonly known as:  ZOLOFT  Take 200 mg by mouth daily.     traZODone 50 MG tablet  Commonly known as:  DESYREL  Take 25 mg by mouth at bedtime as needed for sleep.         Duration of Discharge Encounter: Greater than 30 minutes including physician time.  Jolene Provost PA-C 07/21/2013 12:44 PM

## 2013-07-21 NOTE — Progress Notes (Signed)
    Subjective:  No complaints of SOB or chest pain  Objective:  Vital Signs in the last 24 hours: Temp:  [98 F (36.7 C)-99 F (37.2 C)] 98.2 F (36.8 C) (06/08 0544) Pulse Rate:  [73-92] 73 (06/08 0544) Resp:  [15-20] 18 (06/08 0544) BP: (97-110)/(55-74) 110/73 mmHg (06/08 0544) SpO2:  [96 %-100 %] 96 % (06/08 0544) Weight:  [84.278 kg (185 lb 12.8 oz)-85.639 kg (188 lb 12.8 oz)] 84.278 kg (185 lb 12.8 oz) (06/08 0544)  Intake/Output from previous day:  Intake/Output Summary (Last 24 hours) at 07/21/13 0836 Last data filed at 07/21/13 0834  Gross per 24 hour  Intake    603 ml  Output   1300 ml  Net   -697 ml   . apixaban  5 mg Oral BID  . atorvastatin  40 mg Oral q1800  . digoxin  0.25 mg Oral Daily  . lisinopril  5 mg Oral Daily  . metoprolol tartrate  50 mg Oral BID  . sertraline  200 mg Oral Daily  . sodium chloride  3 mL Intravenous Q12H    Physical Exam: General appearance: alert, cooperative and no distress No JVD Lungs: clear to auscultation bilaterally Heart: regular rate and rhythm Abd: soft No edema   Rate: 74  Rhythm: atrial fibrillation  Lab Results:  Recent Labs  07/20/13 0255 07/21/13 0403  WBC 8.8 8.4  HGB 13.8 14.1  PLT 138* 150    Recent Labs  07/19/13 0308  NA 141  K 3.6*  CL 105  CO2 22  GLUCOSE 94  BUN 13  CREATININE 0.70    Recent Labs  07/19/13 0308  TROPONINI <0.30   No results found for this basename: INR,  in the last 72 hours  Imaging: Imaging results have been reviewed  Cardiac Studies:  Assessment/Plan:  53 yo female with a history of HLD and asthma (on no medications), admitted with [place], AF with RVR, and CHF. EF 35% by echo. Cath 07/17/13 showed minor CAD. TEE on 6/5 showed small LA clot. Plan is for 3 weeks of anticoagulation, then repeat TEE with CV.     Principal Problem:   AF with RVR- LA clot on TEE 07/18/13 Active Problems:   Unstable angina- minor CAD at cath 07/17/13   Acute systolic CHF   Congestive dilated cardiomyopathy- EF 35%   Moderate mitral regurgitation   Dyslipidemia   Asthma    PLAN: Discharge today. F/U with Dr Shirlee Latch in 3 weeks for TEE/CV.   Corine Shelter PA-C Beeper [phone] 07/21/2013, 8:36 AM    Patient seen and examined. Agree with assessment and plan. AF rate controlled. Tolerating Eliquis. DC today with plans for f/u TEE and cardioversion after 1 month of anticoagulation.   Lennette Bihari, MD, [facility] 07/21/2013 9:20 AM

## 2013-07-25 ENCOUNTER — Telehealth: Payer: Self-pay | Admitting: Cardiovascular Disease

## 2013-07-25 NOTE — Telephone Encounter (Signed)
Pt states she noticed a bruising sensation over her upper chest during the night last night. It seems to be worse when she lays on her left side. She states when she pushes on that area it does feel tender. She denies any problems swallowing/throat pain, chest tightness/pain or SOB. She denies any stroke symptoms.   She is going to use tylenol regularly for a couple of days to see if that helps. She knows to see medical care if her symptoms do not improve or become worse.

## 2013-07-25 NOTE — Telephone Encounter (Signed)
Follow up     Patient calling back to speak with nurse    Spoke with Leotis Shames , who advise Dr. Shirlee Latch done procedure on patient.    Patient is aware that nurse is in clinic today.

## 2013-07-25 NOTE — Telephone Encounter (Signed)
New Message:  Pt states Dr. Excell Seltzer did her procedure.. Wants to know if it is normal to have a brusing feeling over her chest..Marland Kitchen

## 2013-08-11 ENCOUNTER — Encounter: Payer: Self-pay | Admitting: *Deleted

## 2013-08-14 ENCOUNTER — Telehealth: Payer: Self-pay | Admitting: *Deleted

## 2013-08-14 ENCOUNTER — Encounter: Payer: Self-pay | Admitting: Physician Assistant

## 2013-08-14 ENCOUNTER — Encounter: Payer: Self-pay | Admitting: *Deleted

## 2013-08-14 ENCOUNTER — Ambulatory Visit (INDEPENDENT_AMBULATORY_CARE_PROVIDER_SITE_OTHER): Payer: BC Managed Care – PPO | Admitting: Physician Assistant

## 2013-08-14 VITALS — BP 110/80 | HR 77 | Ht 67.0 in | Wt 193.0 lb

## 2013-08-14 DIAGNOSIS — I428 Other cardiomyopathies: Secondary | ICD-10-CM | POA: Insufficient documentation

## 2013-08-14 DIAGNOSIS — I43 Cardiomyopathy in diseases classified elsewhere: Secondary | ICD-10-CM | POA: Insufficient documentation

## 2013-08-14 DIAGNOSIS — I340 Nonrheumatic mitral (valve) insufficiency: Secondary | ICD-10-CM

## 2013-08-14 DIAGNOSIS — I059 Rheumatic mitral valve disease, unspecified: Secondary | ICD-10-CM

## 2013-08-14 DIAGNOSIS — I4891 Unspecified atrial fibrillation: Secondary | ICD-10-CM | POA: Insufficient documentation

## 2013-08-14 DIAGNOSIS — I2510 Atherosclerotic heart disease of native coronary artery without angina pectoris: Secondary | ICD-10-CM

## 2013-08-14 DIAGNOSIS — I480 Paroxysmal atrial fibrillation: Secondary | ICD-10-CM

## 2013-08-14 DIAGNOSIS — R072 Precordial pain: Secondary | ICD-10-CM

## 2013-08-14 DIAGNOSIS — I5022 Chronic systolic (congestive) heart failure: Secondary | ICD-10-CM

## 2013-08-14 DIAGNOSIS — R000 Tachycardia, unspecified: Secondary | ICD-10-CM | POA: Insufficient documentation

## 2013-08-14 DIAGNOSIS — E785 Hyperlipidemia, unspecified: Secondary | ICD-10-CM

## 2013-08-14 LAB — BASIC METABOLIC PANEL
BUN: 11 mg/dL (ref 6–23)
CO2: 29 mEq/L (ref 19–32)
Calcium: 9.7 mg/dL (ref 8.4–10.5)
Chloride: 105 mEq/L (ref 96–112)
Creatinine, Ser: 0.7 mg/dL (ref 0.4–1.2)
GFR: 90.81 mL/min (ref >60.00)
Glucose, Bld: 104 mg/dL — ABNORMAL HIGH (ref 70–99)
Potassium: 4.9 mEq/L (ref 3.5–5.1)
Sodium: 140 mEq/L (ref 135–145)

## 2013-08-14 LAB — BRAIN NATRIURETIC PEPTIDE: Pro B Natriuretic peptide (BNP): 118 pg/mL — ABNORMAL HIGH (ref 0.0–100.0)

## 2013-08-14 MED ORDER — FUROSEMIDE 20 MG PO TABS: 20.0000 mg | ORAL_TABLET | Freq: Every day | ORAL | Status: DC

## 2013-08-14 MED ORDER — POTASSIUM CHLORIDE CRYS ER 10 MEQ PO TBCR: 10.0000 meq | EXTENDED_RELEASE_TABLET | Freq: Every day | ORAL | Status: DC

## 2013-08-14 NOTE — H&P (Signed)
History and Physical    Date:  08/14/2013   ID:  Tandrea, Mandella 08/02/1960, MRN [id number]  PCP:  Londell Moh, MD  Cardiologist:  Dr. Marca Ancona      History of Present Illness: Kiara [name] is a 53 y.o. female who was recently admitted 6/3-6/8 with acute (new onset) systolic CHF in the setting of atrial fibrillation with RVR. She complained of chest discomfort concerning for unstable angina. She ruled out for myocardial infarction by enzymes. Echocardiogram demonstrated an EF of 30%. Cardiac catheterization demonstrated minor CAD. She underwent TEE on 6/5 which demonstrated a small left atrial clot. Therefore, she was not cardioverted. Plan was for repeat TEE guided cardioversion after 3 weeks of appropriate anticoagulation. She returns for follow up.  She continues to complain of chest heaviness that is substernal and L sided and radiates to her back.  She notes constant symptoms that worsen with activity. She notes improved dyspnea but still gets short of breath with talking on the phone.  She otherwise is NYHA 2.  She sleeps on 2 pillows.  Denies PND, edema.  Denies syncope.     Studies:  - LHC (07/17/13):  LM 20%, ostial CFX 20-30%  - Echo (07/17/13):  EF 25-30%, diff HK, basal and mid anteroseptum/ basal inferoseptum and apical AK, trivial AI, mod MR, mod LAE, mild reduced RVSF, mild RAE   Recent Labs: 07/16/2013: Pro B Natriuretic peptide (BNP) 1681.0*; TSH 1.450  07/17/2013: HDL Cholesterol by NMR 59; LDL (calc) 125*  07/19/2013: Creatinine 0.70; Potassium 3.6*  07/21/2013: Hemoglobin 14.1   Wt Readings from Last 3 Encounters:  08/14/13 193 lb (87.544 kg)  07/21/13 185 lb 12.8 oz (84.278 kg)  07/21/13 185 lb 12.8 oz (84.278 kg)     Past Medical History  Diagnosis Date  . Asthma   . Hyperlipemia   . Obesity   . Osteopenia   . Nephrolithiasis 2013  . Hypothyroidism     past hx  . Depression   . Atrial fibrillation     a. admx with AFib with RVR and  acute systolic CHF 07/2013; TEE with LAA clot and no DCCV done;    . NICM (nonischemic cardiomyopathy)     a. Echo (07/17/13):  EF 25-30%, diff HK, basal and mid anteroseptum/ basal inferoseptum and apical AK, trivial AI, mod MR, mod LAE, mild reduced RVSF, mild RAE  . Chronic systolic heart failure   . Coronary artery disease     a.  LHC (07/17/13):  LM 20%, ostial CFX 20-30%    Current Outpatient Prescriptions  Medication Sig Dispense Refill  . acetaminophen (TYLENOL) 325 MG tablet Take 2 tablets (650 mg total) by mouth every 6 (six) hours as needed for mild pain.      Marland Kitchen apixaban (ELIQUIS) 5 MG TABS tablet Take 1 tablet (5 mg total) by mouth 2 (two) times daily.  60 tablet  11  . atorvastatin (LIPITOR) 40 MG tablet Take 1 tablet (40 mg total) by mouth daily at 6 PM.  30 tablet  11  . digoxin (LANOXIN) 0.25 MG tablet Take 1 tablet (0.25 mg total) by mouth daily.  30 tablet  11  . lisinopril (PRINIVIL,ZESTRIL) 5 MG tablet Take 1 tablet (5 mg total) by mouth daily.  30 tablet  11  . metoprolol (LOPRESSOR) 50 MG tablet Take 1 tablet (50 mg total) by mouth 2 (two) times daily.  60 tablet  11  . sertraline (ZOLOFT) 100 MG tablet Take 200  mg by mouth daily.       . traZODone (DESYREL) 50 MG tablet Take 25 mg by mouth at bedtime as needed for sleep.       No current facility-administered medications for this visit.    Allergies:   Azithromycin   Social History:  The patient  reports that she has never smoked. She has never used smokeless tobacco. She reports that she drinks about .6 ounces of alcohol per week. She reports that she does not use illicit drugs.   Family History:  The patient's family history includes Hypertension in her father and mother; Lymphoma in her mother.   ROS:  Please see the history of present illness.      All other systems reviewed and negative.   PHYSICAL EXAM: VS:  BP 110/80  Pulse 77  Ht 5\' 7"  (1.702 m)  Wt 193 lb (87.544 kg)  BMI 30.22 kg/m2 Well nourished, well  developed, in no acute distress HEENT: normal Neck: no JVD Cardiac:  normal S1, S2; irreg irreg rhythm; no murmur Lungs:  clear to auscultation bilaterally, no wheezing, rhonchi or rales Abd: soft, nontender, no hepatomegaly Ext: no edema Skin: warm and dry Neuro:  CNs 2-12 intact, no focal abnormalities noted  EKG:  AFib, HR 77     ASSESSMENT AND PLAN:  1. Precordial pain:  She continues to have similar chest pain to her presenting symptoms.  I suspect this is related to her AFib.  Question if she is still somewhat volume overloaded.  She had just minor CAD on cath and has been on Eliquis since d/c without missed doses.  I think she will feel better once she is in NSR.  I discussed her case with Dr. Marca Ancona.  I will arrange repeat TEE guided DCCV next week.  I will also start her on low dose Lasix (20 mg QD) + K+ 10 mEq QD.  Check BMET and BNP today.  Repeat BMET in 1 week.   2. Paroxysmal atrial fibrillation:  HR controlled.  I think she is quite symptomatic as evidenced by her chest discomfort.  Will try to restore NSR by proceeding with TEE-DCCV next week.  Given her cardiomyopathy, if she reverts back to AFib after DCCV, she will need an antiarrhythmic drug (?Amiodarone vs Tikosyn).  She may even benefit from enrolling in the AFib clinic if her rhythm proves difficult to manage.  3. Chronic systolic heart failure:  Her weight is up and she has some dyspnea with talking on the phone.  I suspect she is still s/w volume overloaded and notes continued chest tightness.  I will start Lasix 20 QD, K+ 10 QD.  Check BMET, BNP and repeat BMET in 1 week.  4. NICM (nonischemic cardiomyopathy):  Continue ACEI, beta blocker, Dig.  Consider adding Spironolactone in follow up.   5. Minor CAD by [facility] 07/2013:  Chest pain is likely related to AFib and CHF.  She did not have significant disease on cath.  No ASA as she is on Eliquis.  Continue statin.  6. Dyslipidemia:  Continue statin.  7. Moderate mitral  regurgitation:  Probably functional.   Will see what her MV looks like at TEE and follow up echo. 8. Disposition:  F/u with Dr. Marca Ancona or me 2 weeks after DCCV.    Signed, Brynda Rim, MHS 08/14/2013 12:36 PM    [facility] [address] [place], [place], [place]  [address] Phone: [phone]; Fax: [phone]

## 2013-08-14 NOTE — Telephone Encounter (Signed)
lmom on both home and cell #; labs good BNP improved,

## 2013-08-14 NOTE — Progress Notes (Signed)
Cardiology Office Note    Date:  08/14/2013   ID:  Kiara [name], DOB 1960-10-15, MRN [id number]  PCP:  Londell MohPHARR,WALTER DAVIDSON, MD  Cardiologist:  Dr. Marca Anconaalton [name]      History of Present Illness: Kiara [name] is a 53 y.o. female who was recently admitted 6/3-6/8 with acute (new onset) systolic CHF in the setting of atrial fibrillation with RVR. She complained of chest discomfort concerning for unstable angina. She ruled out for myocardial infarction by enzymes. Echocardiogram demonstrated an EF of 30%. Cardiac catheterization demonstrated minor CAD. She underwent TEE on 6/5 which demonstrated a small left atrial clot. Therefore, she was not cardioverted. Plan was for repeat TEE guided cardioversion after 3 weeks of appropriate anticoagulation. She returns for follow up.  She continues to complain of chest heaviness that is substernal and L sided and radiates to her back.  She notes constant symptoms that worsen with activity. She notes improved dyspnea but still gets short of breath with talking on the phone.  She otherwise is NYHA 2.  She sleeps on 2 pillows.  Denies PND, edema.  Denies syncope.     Studies:  - LHC (07/17/13):  LM 20%, ostial CFX 20-30%  - Echo (07/17/13):  EF 25-30%, diff HK, basal and mid anteroseptum/ basal inferoseptum and apical AK, trivial AI, mod MR, mod LAE, mild reduced RVSF, mild RAE   Recent Labs: 07/16/2013: Pro B Natriuretic peptide (BNP) 1681.0*; TSH 1.450  07/17/2013: HDL Cholesterol by NMR 59; LDL (calc) 125*  07/19/2013: Creatinine 0.70; Potassium 3.6*  07/21/2013: Hemoglobin 14.1   Wt Readings from Last 3 Encounters:  08/14/13 193 lb (87.544 kg)  07/21/13 185 lb 12.8 oz (84.278 kg)  07/21/13 185 lb 12.8 oz (84.278 kg)     Past Medical History  Diagnosis Date  . Asthma   . Hyperlipemia   . Obesity   . Osteopenia   . Nephrolithiasis 2013  . Hypothyroidism     past hx  . Depression   . Atrial fibrillation     a. admx with AFib with RVR and  acute systolic CHF 07/2013; TEE with LAA clot and no DCCV done;    . NICM (nonischemic cardiomyopathy)     a. Echo (07/17/13):  EF 25-30%, diff HK, basal and mid anteroseptum/ basal inferoseptum and apical AK, trivial AI, mod MR, mod LAE, mild reduced RVSF, mild RAE  . Chronic systolic heart failure   . Coronary artery disease     a.  LHC (07/17/13):  LM 20%, ostial CFX 20-30%    Current Outpatient Prescriptions  Medication Sig Dispense Refill  . acetaminophen (TYLENOL) 325 MG tablet Take 2 tablets (650 mg total) by mouth every 6 (six) hours as needed for mild pain.      Marland Kitchen. apixaban (ELIQUIS) 5 MG TABS tablet Take 1 tablet (5 mg total) by mouth 2 (two) times daily.  60 tablet  11  . atorvastatin (LIPITOR) 40 MG tablet Take 1 tablet (40 mg total) by mouth daily at 6 PM.  30 tablet  11  . digoxin (LANOXIN) 0.25 MG tablet Take 1 tablet (0.25 mg total) by mouth daily.  30 tablet  11  . lisinopril (PRINIVIL,ZESTRIL) 5 MG tablet Take 1 tablet (5 mg total) by mouth daily.  30 tablet  11  . metoprolol (LOPRESSOR) 50 MG tablet Take 1 tablet (50 mg total) by mouth 2 (two) times daily.  60 tablet  11  . sertraline (ZOLOFT) 100 MG tablet Take 200  mg by mouth daily.       . traZODone (DESYREL) 50 MG tablet Take 25 mg by mouth at bedtime as needed for sleep.       No current facility-administered medications for this visit.    Allergies:   Azithromycin   Social History:  The patient  reports that she has never smoked. She has never used smokeless tobacco. She reports that she drinks about .6 ounces of alcohol per week. She reports that she does not use illicit drugs.   Family History:  The patient's family history includes Hypertension in her father and mother; Lymphoma in her mother.   ROS:  Please see the history of present illness.      All other systems reviewed and negative.   PHYSICAL EXAM: VS:  BP 110/80  Pulse 77  Ht 5\' 7"  (1.702 m)  Wt 193 lb (87.544 kg)  BMI 30.22 kg/m2 Well nourished, well  developed, in no acute distress HEENT: normal Neck: no JVD Cardiac:  normal S1, S2; irreg irreg rhythm; no murmur Lungs:  clear to auscultation bilaterally, no wheezing, rhonchi or rales Abd: soft, nontender, no hepatomegaly Ext: no edema Skin: warm and dry Neuro:  CNs 2-12 intact, no focal abnormalities noted  EKG:  AFib, HR 77     ASSESSMENT AND PLAN:  1. Precordial pain:  She continues to have similar chest pain to her presenting symptoms.  I suspect this is related to her AFib.  Question if she is still somewhat volume overloaded.  She had just minor CAD on cath and has been on Eliquis since d/c without missed doses.  I think she will feel better once she is in NSR.  I discussed her case with Dr. Marca Ancona.  I will arrange repeat TEE guided DCCV next week.  I will also start her on low dose Lasix (20 mg QD) + K+ 10 mEq QD.  Check BMET and BNP today.  Repeat BMET in 1 week.   2. Paroxysmal atrial fibrillation:  HR controlled.  I think she is quite symptomatic as evidenced by her chest discomfort.  Will try to restore NSR by proceeding with TEE-DCCV next week.  Given her cardiomyopathy, if she reverts back to AFib after DCCV, she will need an antiarrhythmic drug (?Amiodarone vs Tikosyn).  She may even benefit from enrolling in the AFib clinic if her rhythm proves difficult to manage.  3. Chronic systolic heart failure:  Her weight is up and she has some dyspnea with talking on the phone.  I suspect she is still s/w volume overloaded and notes continued chest tightness.  I will start Lasix 20 QD, K+ 10 QD.  Check BMET, BNP and repeat BMET in 1 week.  4. NICM (nonischemic cardiomyopathy):  Continue ACEI, beta blocker, Dig.  Consider adding Spironolactone in follow up.   5. Minor CAD by [facility] 07/2013:  Chest pain is likely related to AFib and CHF.  She did not have significant disease on cath.  No ASA as she is on Eliquis.  Continue statin.  6. Dyslipidemia:  Continue statin.  7. Moderate mitral  regurgitation:  Probably functional.   Will see what her MV looks like at TEE and follow up echo. 8. Disposition:  F/u with Dr. Marca Ancona or me 2 weeks after DCCV.    Signed, Brynda Rim, MHS 08/14/2013 12:36 PM    [facility] [address] [place], [place], [place]  [address] Phone: [phone]; Fax: [phone]

## 2013-08-14 NOTE — Patient Instructions (Signed)
Your physician has requested that you have a TEE/Cardioversion. During a TEE, sound waves are used to create images of your heart. It provides your doctor with information about the size and shape of your heart and how well your heart's chambers and valves are working. In this test, a transducer is attached to the end of a flexible tube that is guided down you throat and into your esophagus (the tube leading from your mouth to your stomach) to get a more detailed image of your heart. Once the TEE has determined that a blood clot is not present, the cardioversion begins. Electrical Cardioversion uses a jolt of electricity to your heart either through paddles or wired patches attached to your chest. This is a controlled, usually prescheduled, procedure. This procedure is done at the hospital and you are not awake during the procedure. You usually go home the day of the procedure. Please see the instruction sheet given to you today for more information.  Your physician recommends that you return for lab work in: TODAY BMET, BNP  REPEAT  BMET 1 WEEK  FOLLOW UP WITH [place], [facility] 09/04/13 @ 2:40 SAME DAY DR. Shirlee Latch IS IN THE OFFICE PER SCOTT WEAVER, APC

## 2013-08-18 ENCOUNTER — Ambulatory Visit (HOSPITAL_COMMUNITY)
Admission: RE | Admit: 2013-08-18 | Discharge: 2013-08-18 | Disposition: A | Discharge status: Home / Self Care | Payer: BC Managed Care – PPO | Source: Ambulatory Visit | Attending: Cardiology | Admitting: Cardiology

## 2013-08-18 ENCOUNTER — Encounter (HOSPITAL_COMMUNITY)
Admission: RE | Disposition: A | Discharge status: Home / Self Care | Payer: Self-pay | Source: Ambulatory Visit | Attending: Cardiology

## 2013-08-18 ENCOUNTER — Encounter (HOSPITAL_COMMUNITY): Payer: BC Managed Care – PPO | Admitting: Certified Registered Nurse Anesthetist

## 2013-08-18 ENCOUNTER — Ambulatory Visit (HOSPITAL_COMMUNITY): Payer: BC Managed Care – PPO | Admitting: Certified Registered Nurse Anesthetist

## 2013-08-18 ENCOUNTER — Encounter (HOSPITAL_COMMUNITY): Payer: Self-pay | Admitting: *Deleted

## 2013-08-18 DIAGNOSIS — I428 Other cardiomyopathies: Secondary | ICD-10-CM | POA: Diagnosis not present

## 2013-08-18 DIAGNOSIS — I2510 Atherosclerotic heart disease of native coronary artery without angina pectoris: Secondary | ICD-10-CM | POA: Insufficient documentation

## 2013-08-18 DIAGNOSIS — M899 Disorder of bone, unspecified: Secondary | ICD-10-CM | POA: Insufficient documentation

## 2013-08-18 DIAGNOSIS — E039 Hypothyroidism, unspecified: Secondary | ICD-10-CM | POA: Diagnosis not present

## 2013-08-18 DIAGNOSIS — J45909 Unspecified asthma, uncomplicated: Secondary | ICD-10-CM | POA: Insufficient documentation

## 2013-08-18 DIAGNOSIS — Z7901 Long term (current) use of anticoagulants: Secondary | ICD-10-CM | POA: Insufficient documentation

## 2013-08-18 DIAGNOSIS — Z6830 Body mass index (BMI) 30.0-30.9, adult: Secondary | ICD-10-CM | POA: Insufficient documentation

## 2013-08-18 DIAGNOSIS — F329 Major depressive disorder, single episode, unspecified: Secondary | ICD-10-CM | POA: Insufficient documentation

## 2013-08-18 DIAGNOSIS — F3289 Other specified depressive episodes: Secondary | ICD-10-CM | POA: Diagnosis not present

## 2013-08-18 DIAGNOSIS — I509 Heart failure, unspecified: Secondary | ICD-10-CM | POA: Diagnosis not present

## 2013-08-18 DIAGNOSIS — I4891 Unspecified atrial fibrillation: Secondary | ICD-10-CM | POA: Diagnosis present

## 2013-08-18 DIAGNOSIS — I059 Rheumatic mitral valve disease, unspecified: Secondary | ICD-10-CM | POA: Diagnosis not present

## 2013-08-18 DIAGNOSIS — E785 Hyperlipidemia, unspecified: Secondary | ICD-10-CM | POA: Diagnosis not present

## 2013-08-18 DIAGNOSIS — I480 Paroxysmal atrial fibrillation: Secondary | ICD-10-CM

## 2013-08-18 DIAGNOSIS — I5022 Chronic systolic (congestive) heart failure: Secondary | ICD-10-CM | POA: Diagnosis not present

## 2013-08-18 DIAGNOSIS — M949 Disorder of cartilage, unspecified: Secondary | ICD-10-CM

## 2013-08-18 HISTORY — PX: TEE WITHOUT CARDIOVERSION: SHX5443

## 2013-08-18 HISTORY — PX: CARDIOVERSION: SHX1299

## 2013-08-18 SURGERY — ECHOCARDIOGRAM, TRANSESOPHAGEAL
Anesthesia: Monitor Anesthesia Care

## 2013-08-18 MED ORDER — SODIUM CHLORIDE 0.9 % IV SOLN
INTRAVENOUS | Status: DC | PRN
  Administered 2013-08-18: 14:00:00 via INTRAVENOUS

## 2013-08-18 MED ORDER — BUTAMBEN-TETRACAINE-BENZOCAINE 2-2-14 % EX AERO
INHALATION_SPRAY | CUTANEOUS | Status: DC | PRN
  Administered 2013-08-18: 2 via TOPICAL

## 2013-08-18 MED ORDER — PROPOFOL INFUSION 10 MG/ML OPTIME
INTRAVENOUS | Status: DC | PRN
  Administered 2013-08-18: 180 ug/kg/min via INTRAVENOUS

## 2013-08-18 MED ORDER — SODIUM CHLORIDE 0.9 % IV SOLN
INTRAVENOUS | Status: DC
  Administered 2013-08-18: 500 mL via INTRAVENOUS

## 2013-08-18 NOTE — Progress Notes (Signed)
Echocardiogram Echocardiogram Transesophageal has been performed.  Leevon Upperman 08/18/2013, 2:13 PM

## 2013-08-18 NOTE — Transfer of Care (Signed)
Immediate Anesthesia Transfer of Care Note  Patient: Kiara [name]  Procedure(s) Performed: Procedure(s): TRANSESOPHAGEAL ECHOCARDIOGRAM (TEE) (N/A) CARDIOVERSION (N/A)  Patient Location: Endoscopy Unit  Anesthesia Type:MAC  Level of Consciousness: awake, alert  and oriented  Airway & Oxygen Therapy: Patient Spontanous Breathing and Patient connected to nasal cannula oxygen  Post-op Assessment: Report given to PACU RN, Post -op Vital signs reviewed and stable and Patient moving all extremities X 4  Post vital signs: Reviewed and stable  Complications: No apparent anesthesia complications

## 2013-08-18 NOTE — Discharge Instructions (Signed)
Electrical Cardioversion, Care After °Refer to this sheet in the next few weeks. These instructions provide you with information on caring for yourself after your procedure. Your health care provider may also give you more specific instructions. Your treatment has been planned according to current medical practices, but problems sometimes occur. Call your health care provider if you have any problems or questions after your procedure. °WHAT TO EXPECT AFTER THE PROCEDURE °After your procedure, it is typical to have the following sensations: °· Some redness on the skin where the shocks were delivered. If this is tender, a sunburn lotion or hydrocortisone cream may help. °· Possible return of an abnormal heart rhythm within hours or days after the procedure. °HOME CARE INSTRUCTIONS °· Only take medicine as directed by your health care provider. Be sure you understand how and when to take your medicine. °· Learn how to feel your pulse and check it often. °· Limit your activity for 48 hours after the procedure or as directed. °· Avoid or minimize caffeine and other stimulants as directed. °SEEK MEDICAL CARE IF: °· You feel like your heart is beating too fast or your pulse is not regular. °· You have any questions about your medicines. °· You have bleeding that will not stop. °SEEK IMMEDIATE MEDICAL CARE IF: °· You are dizzy or feel faint. °· It is hard to breathe or you feel short of breath. °· There is a change in discomfort in your chest. °· Your speech is slurred or you have trouble moving an arm or leg on one side of your body. °· You get a serious muscle cramp that does not go away. °· Your fingers or toes turn cold or blue. °MAKE SURE YOU:  °· Understand these instructions.   °· Will watch your condition.   °· Will get help right away if you are not doing well or get worse. °Document Released: 11/20/2012 Document Reviewed: 11/20/2012 °[employer]® Patient Information ©2015 [employer]. This information is not  intended to replace advice given to you by your health care provider. Make sure you discuss any questions you have with your health care provider. ° °

## 2013-08-18 NOTE — Anesthesia Procedure Notes (Signed)
Procedure Name: MAC Date/Time: 08/18/2013 1:41 PM Performed by: Vita Barley E Pre-anesthesia Checklist: Patient identified, Emergency Drugs available, Suction available and Patient being monitored Patient Re-evaluated:Patient Re-evaluated prior to inductionOxygen Delivery Method: Nasal cannula Preoxygenation: Pre-oxygenation with 100% oxygen Intubation Type: IV induction Placement Confirmation: positive ETCO2 and breath sounds checked- equal and bilateral Dental Injury: Teeth and Oropharynx as per pre-operative assessment

## 2013-08-18 NOTE — CV Procedure (Signed)
    Electrical Cardioversion Procedure Note Kiara [name] [id number] 11-14-1960  Procedure: Electrical Cardioversion Indications:  Atrial Fibrillation  Time Out: Verified patient identification, verified procedure,medications/allergies/relevent history reviewed, required imaging and test results available.  Performed  Procedure Details  The patient was NPO after midnight. Anesthesia was administered at the beside with propofol.  Cardioversion was performed with synchronized biphasic defibrillation via AP pads with 120 joules.  1 attempt(s) were performed.  The patient converted to normal sinus rhythm. The patient tolerated the procedure well   IMPRESSION:  Successful cardioversion of atrial fibrillation. No signs of left atrial appendage thrombus. Sinus bradycardia post conversion. Will consider DC digoxin.   Jesseka Drinkard 08/18/2013, 1:57 PM

## 2013-08-18 NOTE — Interval H&P Note (Signed)
History and Physical Interval Note:  08/18/2013 1:36 PM  Kiara [name]  has presented today for surgery, with the diagnosis of AFIB  The various methods of treatment have been discussed with the patient and family. After consideration of risks, benefits and other options for treatment, the patient has consented to  Procedure(s): TRANSESOPHAGEAL ECHOCARDIOGRAM (TEE) (N/A) CARDIOVERSION (N/A) as a surgical intervention .  The patient's history has been reviewed, patient examined, no change in status, stable for surgery.  I have reviewed the patient's chart and labs.  Questions were answered to the patient's satisfaction.     SKAINS, MARK

## 2013-08-18 NOTE — Anesthesia Postprocedure Evaluation (Signed)
  Anesthesia Post-op Note  Patient: Kiara [name]  Procedure(s) Performed: Procedure(s): TRANSESOPHAGEAL ECHOCARDIOGRAM (TEE) (N/A) CARDIOVERSION (N/A)  Patient Location: Endoscopy Unit  Anesthesia Type:MAC  Level of Consciousness: awake, alert  and oriented  Airway and Oxygen Therapy: Patient Spontanous Breathing and Patient connected to nasal cannula oxygen  Post-op Pain: none  Post-op Assessment: Post-op Vital signs reviewed, Patient's Cardiovascular Status Stable, Respiratory Function Stable, Patent Airway and No signs of Nausea or vomiting  Post-op Vital Signs: Reviewed and stable  Last Vitals:  Filed Vitals:   08/18/13 1405  BP: 98/52  Pulse: 47  Temp:   Resp: 12    Complications: No apparent anesthesia complications

## 2013-08-18 NOTE — Anesthesia Preprocedure Evaluation (Addendum)
Anesthesia Evaluation  Patient identified by MRN, date of birth, ID band Patient awake    Reviewed: Allergy & Precautions, H&P , NPO status , Patient's Chart, lab work & pertinent test results, reviewed documented beta blocker date and time   History of Anesthesia Complications Negative for: history of anesthetic complications  Airway Mallampati: II TM Distance: >3 FB Neck ROM: Full    Dental  (+) Teeth Intact, Dental Advisory Given   Pulmonary asthma ,    Pulmonary exam normal       Cardiovascular + CAD Rhythm:Irregular  Echo (07/17/13):  EF 25-30%, diff HK, basal and mid anteroseptum/ basal inferoseptum and apical AK, trivial AI, mod MR, mod LAE, mild reduced RVSF, mild RAE   Neuro/Psych PSYCHIATRIC DISORDERS Depression negative neurological ROS     GI/Hepatic negative GI ROS, Neg liver ROS,   Endo/Other  Morbid obesity  Renal/GU negative Renal ROS     Musculoskeletal   Abdominal   Peds  Hematology   Anesthesia Other Findings   Reproductive/Obstetrics                          Anesthesia Physical Anesthesia Plan  ASA: III  Anesthesia Plan: MAC and General   Post-op Pain Management:    Induction: Intravenous  Airway Management Planned: Mask  Additional Equipment:   Intra-op Plan:   Post-operative Plan:   Informed Consent: I have reviewed the patients History and Physical, chart, labs and discussed the procedure including the risks, benefits and alternatives for the proposed anesthesia with the patient or authorized representative who has indicated his/her understanding and acceptance.   Dental advisory given  Plan Discussed with: CRNA, Anesthesiologist and Surgeon  Anesthesia Plan Comments:        Anesthesia Quick Evaluation

## 2013-08-19 ENCOUNTER — Encounter (HOSPITAL_COMMUNITY): Payer: Self-pay | Admitting: Cardiology

## 2013-08-22 ENCOUNTER — Other Ambulatory Visit (INDEPENDENT_AMBULATORY_CARE_PROVIDER_SITE_OTHER): Payer: BC Managed Care – PPO

## 2013-08-22 DIAGNOSIS — I2510 Atherosclerotic heart disease of native coronary artery without angina pectoris: Secondary | ICD-10-CM

## 2013-08-22 DIAGNOSIS — I4891 Unspecified atrial fibrillation: Secondary | ICD-10-CM

## 2013-08-22 DIAGNOSIS — R072 Precordial pain: Secondary | ICD-10-CM

## 2013-08-22 DIAGNOSIS — I059 Rheumatic mitral valve disease, unspecified: Secondary | ICD-10-CM

## 2013-08-22 DIAGNOSIS — I428 Other cardiomyopathies: Secondary | ICD-10-CM

## 2013-08-22 DIAGNOSIS — I480 Paroxysmal atrial fibrillation: Secondary | ICD-10-CM

## 2013-08-22 DIAGNOSIS — E785 Hyperlipidemia, unspecified: Secondary | ICD-10-CM

## 2013-08-22 DIAGNOSIS — I5022 Chronic systolic (congestive) heart failure: Secondary | ICD-10-CM

## 2013-08-22 DIAGNOSIS — I340 Nonrheumatic mitral (valve) insufficiency: Secondary | ICD-10-CM

## 2013-08-22 LAB — BASIC METABOLIC PANEL
BUN: 13 mg/dL (ref 6–23)
CO2: 26 mEq/L (ref 19–32)
Calcium: 9.6 mg/dL (ref 8.4–10.5)
Chloride: 105 mEq/L (ref 96–112)
Creatinine, Ser: 0.6 mg/dL (ref 0.4–1.2)
GFR: 104.23 mL/min (ref >60.00)
Glucose, Bld: 97 mg/dL (ref 70–99)
Potassium: 4.1 mEq/L (ref 3.5–5.1)
Sodium: 138 mEq/L (ref 135–145)

## 2013-08-24 ENCOUNTER — Telehealth: Payer: Self-pay | Admitting: Cardiology

## 2013-08-24 NOTE — Telephone Encounter (Signed)
Patient called stating that today her HR has been in the 80-90's but occasionally gets up to 105bpm.  She does not feel bad but was concerned.  SHe is anticoagulated and had a recent TEE/DCCV.  I recommended continue current therapy and call the office in the am to come in for an EKG to see if she is back in afib.  SHe was instructed to call if she started to have faster HR or felt bad

## 2013-08-25 ENCOUNTER — Telehealth: Payer: Self-pay | Admitting: *Deleted

## 2013-08-25 ENCOUNTER — Telehealth: Payer: Self-pay | Admitting: Pharmacist

## 2013-08-25 ENCOUNTER — Ambulatory Visit (INDEPENDENT_AMBULATORY_CARE_PROVIDER_SITE_OTHER): Payer: BC Managed Care – PPO | Admitting: Internal Medicine

## 2013-08-25 VITALS — BP 122/98 | HR 84 | Ht 67.0 in | Wt 190.0 lb

## 2013-08-25 DIAGNOSIS — I4891 Unspecified atrial fibrillation: Secondary | ICD-10-CM

## 2013-08-25 DIAGNOSIS — I480 Paroxysmal atrial fibrillation: Secondary | ICD-10-CM

## 2013-08-25 LAB — CBC WITH DIFFERENTIAL/PLATELET
Basophils Absolute: 0.4 10*3/uL — ABNORMAL HIGH (ref 0.0–0.1)
Basophils Relative: 3.8 % — ABNORMAL HIGH (ref 0.0–3.0)
Eosinophils Absolute: 0.2 10*3/uL (ref 0.0–0.7)
Eosinophils Relative: 2.2 % (ref 0.0–5.0)
HCT: 46 % (ref 36.0–46.0)
Hemoglobin: 15.2 g/dL — ABNORMAL HIGH (ref 12.0–15.0)
Lymphocytes Relative: 9.8 % — ABNORMAL LOW (ref 12.0–46.0)
Lymphs Abs: 0.9 10*3/uL (ref 0.7–4.0)
MCHC: 33.1 g/dL (ref 30.0–36.0)
MCV: 87.7 fl (ref 78.0–100.0)
Monocytes Absolute: 0.6 10*3/uL (ref 0.1–1.0)
Monocytes Relative: 6.9 % (ref 3.0–12.0)
Neutro Abs: 7.2 10*3/uL (ref 1.4–7.7)
Neutrophils Relative %: 77.3 % — ABNORMAL HIGH (ref 43.0–77.0)
Platelets: 182 10*3/uL (ref 150.0–400.0)
RBC: 5.25 Mil/uL — ABNORMAL HIGH (ref 3.87–5.11)
RDW: 14.2 % (ref 11.5–15.5)
WBC: 9.3 10*3/uL (ref 4.0–10.5)

## 2013-08-25 LAB — BASIC METABOLIC PANEL
BUN: 11 mg/dL (ref 6–23)
CO2: 23 mEq/L (ref 19–32)
Calcium: 9.2 mg/dL (ref 8.4–10.5)
Chloride: 108 mEq/L (ref 96–112)
Creatinine, Ser: 0.7 mg/dL (ref 0.4–1.2)
GFR: 89.34 mL/min (ref >60.00)
Glucose, Bld: 103 mg/dL — ABNORMAL HIGH (ref 70–99)
Potassium: 4.2 mEq/L (ref 3.5–5.1)
Sodium: 141 mEq/L (ref 135–145)

## 2013-08-25 LAB — MAGNESIUM: Magnesium: 1.9 mg/dL (ref 1.5–2.5)

## 2013-08-25 NOTE — Progress Notes (Signed)
HPI Patient is a 53 yo who has hsitory of newly diagnosed systolic CHF.  Echo shows LVEF 30%  Cardiac cath with minimal CAD.  She underwent TEE on 6/5 that showed small LA thrombus.  Repeat TEE 3 weeks later (last week) showed resolution and she underwent cardioversion. After cardioversion she says that she felt better  No chest pressure  Had more energy.  Friday night and Saturday she noted her HR was increased.  COmplaine of fatigue  She continued to feel poorly Sunday  Tired  Today the same  Has soome chest pressure.   She continues to take Eliquis  Allergies  Allergen Reactions  . Azithromycin Diarrhea and Nausea And Vomiting    Current Outpatient Prescriptions  Medication Sig Dispense Refill  . acetaminophen (TYLENOL) 325 MG tablet Take 2 tablets (650 mg total) by mouth every 6 (six) hours as needed for mild pain.      Marland Kitchen. apixaban (ELIQUIS) 5 MG TABS tablet Take 1 tablet (5 mg total) by mouth 2 (two) times daily.  60 tablet  11  . atorvastatin (LIPITOR) 40 MG tablet Take 1 tablet (40 mg total) by mouth daily at 6 PM.  30 tablet  11  . furosemide (LASIX) 20 MG tablet Take 1 tablet (20 mg total) by mouth daily.  30 tablet  11  . lisinopril (PRINIVIL,ZESTRIL) 5 MG tablet Take 1 tablet (5 mg total) by mouth daily.  30 tablet  11  . metoprolol (LOPRESSOR) 50 MG tablet Take 1 tablet (50 mg total) by mouth 2 (two) times daily.  60 tablet  11  . potassium chloride SA (K-DUR,KLOR-CON) 10 MEQ tablet Take 1 tablet (10 mEq total) by mouth daily.  30 tablet  11  . sertraline (ZOLOFT) 100 MG tablet Take 200 mg by mouth daily.       . traZODone (DESYREL) 50 MG tablet Take 25 mg by mouth at bedtime as needed for sleep.       No current facility-administered medications for this visit.    Past Medical History  Diagnosis Date  . Asthma   . Hyperlipemia   . Obesity   . Osteopenia   . Nephrolithiasis 2013  . Hypothyroidism     past hx  . Depression   . Atrial fibrillation     a. admx with AFib  with RVR and acute systolic CHF 07/2013; TEE with LAA clot and no DCCV done;    . NICM (nonischemic cardiomyopathy)     a. Echo (07/17/13):  EF 25-30%, diff HK, basal and mid anteroseptum/ basal inferoseptum and apical AK, trivial AI, mod MR, mod LAE, mild reduced RVSF, mild RAE  . Chronic systolic heart failure   . Coronary artery disease     a.  LHC (07/17/13):  LM 20%, ostial CFX 20-30%    Past Surgical History  Procedure Laterality Date  . Appendectomy  1978  . Abdominal hysterectomy  ~ 2001  . Tee without cardioversion N/A 07/18/2013    Procedure: TRANSESOPHAGEAL ECHOCARDIOGRAM (TEE);  Surgeon: Lewayne BuntingBrian S Crenshaw, MD;  [facility];  Service: Cardiovascular;  Laterality: N/A;  . Tee without cardioversion N/A 08/18/2013    Procedure: TRANSESOPHAGEAL ECHOCARDIOGRAM (TEE);  Surgeon: Donato SchultzMark Skains, MD;  [facility];  Service: Cardiovascular;  Laterality: N/A;  . Cardioversion N/A 08/18/2013    Procedure: CARDIOVERSION;  Surgeon: Donato SchultzMark Skains, MD;  [facility];  Service: Cardiovascular;  Laterality: N/A;    Family History  Problem Relation Age of Onset  . Hypertension  Mother   . Hypertension Father   . Lymphoma Mother     History   Social History  . Marital Status: Married    Spouse Name: N/A    Number of Children: N/A  . Years of Education: N/A   Occupational History  . Not on file.   Social History Main Topics  . Smoking status: Never Smoker   . Smokeless tobacco: Never Used  . Alcohol Use: 0.6 oz/week    1 Cans of beer per week  . Drug Use: No  . Sexual Activity: Yes   Other Topics Concern  . Not on file   Social History Narrative  . No narrative on file    Review of Systems:  All systems reviewed.  They are negative to the above problem except as previously stated.  Vital Signs: BP 122/98  Pulse 84  Ht 5\' 7"  (1.702 m)  Wt 190 lb (86.183 kg)  BMI 29.75 kg/m2  Physical Exam Patient is in NAD HEENT:  Normocephalic, atraumatic. EOMI,  PERRLA.  Neck: JVP is normal.  No bruits.  Lungs: clear to auscultation. No rales no wheezes.  Heart: Irregular rate and rhythm. Normal S1, S2. No S3.   No significant murmurs. PMI not displaced.  Abdomen:  Supple, nontender. Normal bowel sounds. No masses. No hepatomegaly.  Extremities:   Good distal pulses throughout. No lower extremity edema.  Musculoskeletal :moving all extremities.  Neuro:   alert and oriented x3.  CN II-XII grossly intact.  Atrial fibrillation  84 bpm   QTc 410 msec Assessment and Plan:  1.  Atrial fib.  patinet is back in afib after 1 wk.  SHe is tired, has some chest heaviness.  Said she feels different than right after cardioversion.   ON exam she has no evid of CHF   I think she will feel better if she were maintained in SR Would recomm initiation of Tikosyn  Discussed with patient nad husband.  They agree. Plan for admission tomorrow.  WIll arrange.  Reviewed with J Smart.  2. NICM  Volume status is OK  Will check electrolytes  Keep on same medicines.

## 2013-08-25 NOTE — Telephone Encounter (Signed)
Dr. Tenny Craw saw patient in office today, and would like to start patient on Tikosyn tomorrow.  Patient has failed to convert to NSR following cardioversion.  She is currently not on any antiarrhythmics, or contraindicated medications for Tikosyn use.  Both patient and husband counseled on importance of compliance with Tikosyn and they understand to call our office if she misses more than 2 consecutive doses.  She has not missed any doses of Eliquis over the past 4 weeks.  She has Media planner, and was given a voucher today to help reduce cost at the pharmacy.  QTc was acceptable today.  She has Trazodone on her med list, but tells me she hardly ever takes this, and instead will start taking melatonin if needed for sleep.  She is due to get a BMET and Magnesium today STAT, and if potassium and magnesium are in normal range would like to get admitted to hospital tomorrow per Dr. Tenny Craw.

## 2013-08-25 NOTE — Telephone Encounter (Signed)
See other telephone note.  

## 2013-08-25 NOTE — Telephone Encounter (Signed)
Pt saw DOD Dr Mayford Knife today for re occurring afib.

## 2013-08-25 NOTE — Telephone Encounter (Signed)
pt notified about lab results normal. Pt then states to me she s/w Dr. Mayford Knife last night about heart rate 105. Pt had recent DCCV for a-fib. Pt said she needed to come in today for EKG. I s/w Corky Crafts, RN and transfered call to Triage

## 2013-08-25 NOTE — Patient Instructions (Addendum)
Your physician recommends that you continue on your current medications as directed. Please refer to the Current Medication list given to you today.  Will obtain labs today and call you with the results (cbc/bmetmagnesium)  Will arrange for you to go to [facility] for starting of Tikosyn

## 2013-08-26 ENCOUNTER — Encounter (HOSPITAL_COMMUNITY): Payer: Self-pay | Admitting: General Practice

## 2013-08-26 ENCOUNTER — Inpatient Hospital Stay (HOSPITAL_COMMUNITY)
Admission: AD | Admit: 2013-08-26 | Discharge: 2013-09-02 | DRG: 309 | Disposition: A | Discharge status: Home / Self Care | Payer: BC Managed Care – PPO | Source: Ambulatory Visit | Attending: Internal Medicine | Admitting: Internal Medicine

## 2013-08-26 ENCOUNTER — Other Ambulatory Visit: Payer: Self-pay | Admitting: Internal Medicine

## 2013-08-26 DIAGNOSIS — I4891 Unspecified atrial fibrillation: Principal | ICD-10-CM | POA: Diagnosis present

## 2013-08-26 DIAGNOSIS — I428 Other cardiomyopathies: Secondary | ICD-10-CM

## 2013-08-26 DIAGNOSIS — E669 Obesity, unspecified: Secondary | ICD-10-CM | POA: Diagnosis present

## 2013-08-26 DIAGNOSIS — I340 Nonrheumatic mitral (valve) insufficiency: Secondary | ICD-10-CM | POA: Diagnosis present

## 2013-08-26 DIAGNOSIS — Z8709 Personal history of other diseases of the respiratory system: Secondary | ICD-10-CM

## 2013-08-26 DIAGNOSIS — I498 Other specified cardiac arrhythmias: Secondary | ICD-10-CM | POA: Diagnosis not present

## 2013-08-26 DIAGNOSIS — M899 Disorder of bone, unspecified: Secondary | ICD-10-CM | POA: Diagnosis present

## 2013-08-26 DIAGNOSIS — M949 Disorder of cartilage, unspecified: Secondary | ICD-10-CM

## 2013-08-26 DIAGNOSIS — J45909 Unspecified asthma, uncomplicated: Secondary | ICD-10-CM | POA: Diagnosis present

## 2013-08-26 DIAGNOSIS — R000 Tachycardia, unspecified: Secondary | ICD-10-CM | POA: Diagnosis present

## 2013-08-26 DIAGNOSIS — I43 Cardiomyopathy in diseases classified elsewhere: Secondary | ICD-10-CM | POA: Diagnosis present

## 2013-08-26 DIAGNOSIS — I5022 Chronic systolic (congestive) heart failure: Secondary | ICD-10-CM | POA: Diagnosis present

## 2013-08-26 DIAGNOSIS — I059 Rheumatic mitral valve disease, unspecified: Secondary | ICD-10-CM

## 2013-08-26 DIAGNOSIS — E785 Hyperlipidemia, unspecified: Secondary | ICD-10-CM | POA: Diagnosis present

## 2013-08-26 DIAGNOSIS — R001 Bradycardia, unspecified: Secondary | ICD-10-CM

## 2013-08-26 DIAGNOSIS — I2510 Atherosclerotic heart disease of native coronary artery without angina pectoris: Secondary | ICD-10-CM | POA: Diagnosis present

## 2013-08-26 LAB — BASIC METABOLIC PANEL
Anion gap: 16 — ABNORMAL HIGH (ref 5–15)
BUN: 15 mg/dL (ref 6–23)
CO2: 21 mEq/L (ref 19–32)
Calcium: 9.5 mg/dL (ref 8.4–10.5)
Chloride: 105 mEq/L (ref 96–112)
Creatinine, Ser: 0.7 mg/dL (ref 0.50–1.10)
GFR calc Af Amer: >90 mL/min (ref >90)
GFR calc non Af Amer: >90 mL/min (ref >90)
Glucose, Bld: 109 mg/dL — ABNORMAL HIGH (ref 70–99)
Potassium: 4.4 mEq/L (ref 3.7–5.3)
Sodium: 142 mEq/L (ref 137–147)

## 2013-08-26 LAB — MAGNESIUM: Magnesium: 2.2 mg/dL (ref 1.5–2.5)

## 2013-08-26 MED ORDER — SERTRALINE HCL 100 MG PO TABS
200.0000 mg | ORAL_TABLET | Freq: Every day | ORAL | Status: DC
  Administered 2013-08-27 – 2013-09-02 (×7): 200 mg via ORAL
  Filled 2013-08-26 (×8): qty 2

## 2013-08-26 MED ORDER — APIXABAN 5 MG PO TABS
5.0000 mg | ORAL_TABLET | Freq: Two times a day (BID) | ORAL | Status: DC
  Filled 2013-08-26: qty 1

## 2013-08-26 MED ORDER — POTASSIUM CHLORIDE ER 10 MEQ PO TBCR
10.0000 meq | EXTENDED_RELEASE_TABLET | Freq: Every day | ORAL | Status: DC
  Administered 2013-08-27 – 2013-09-02 (×7): 10 meq via ORAL
  Filled 2013-08-26 (×7): qty 1

## 2013-08-26 MED ORDER — ACETAMINOPHEN 325 MG PO TABS
650.0000 mg | ORAL_TABLET | Freq: Four times a day (QID) | ORAL | Status: DC | PRN
  Administered 2013-08-26 – 2013-08-30 (×2): 650 mg via ORAL
  Filled 2013-08-26 (×2): qty 2

## 2013-08-26 MED ORDER — LISINOPRIL 5 MG PO TABS
5.0000 mg | ORAL_TABLET | Freq: Every day | ORAL | Status: DC
  Administered 2013-08-26 – 2013-09-02 (×7): 5 mg via ORAL
  Filled 2013-08-26 (×8): qty 1

## 2013-08-26 MED ORDER — SODIUM CHLORIDE 0.9 % IJ SOLN
3.0000 mL | Freq: Two times a day (BID) | INTRAMUSCULAR | Status: DC
  Administered 2013-08-26 – 2013-08-30 (×6): 3 mL via INTRAVENOUS

## 2013-08-26 MED ORDER — METOPROLOL TARTRATE 50 MG PO TABS
50.0000 mg | ORAL_TABLET | Freq: Two times a day (BID) | ORAL | Status: DC
  Filled 2013-08-26 (×2): qty 1

## 2013-08-26 MED ORDER — DOFETILIDE 500 MCG PO CAPS
500.0000 ug | ORAL_CAPSULE | Freq: Two times a day (BID) | ORAL | Status: DC
  Administered 2013-08-26 – 2013-08-30 (×8): 500 ug via ORAL
  Filled 2013-08-26 (×11): qty 1

## 2013-08-26 MED ORDER — FUROSEMIDE 20 MG PO TABS
20.0000 mg | ORAL_TABLET | Freq: Every day | ORAL | Status: DC
  Administered 2013-08-26 – 2013-09-02 (×8): 20 mg via ORAL
  Filled 2013-08-26 (×10): qty 1

## 2013-08-26 MED ORDER — APIXABAN 5 MG PO TABS
5.0000 mg | ORAL_TABLET | Freq: Two times a day (BID) | ORAL | Status: DC
  Administered 2013-08-26 – 2013-09-02 (×14): 5 mg via ORAL
  Filled 2013-08-26 (×15): qty 1

## 2013-08-26 MED ORDER — METOPROLOL TARTRATE 50 MG PO TABS
50.0000 mg | ORAL_TABLET | Freq: Two times a day (BID) | ORAL | Status: DC
  Administered 2013-08-26: 50 mg via ORAL
  Filled 2013-08-26 (×3): qty 1

## 2013-08-26 MED ORDER — ZOLPIDEM TARTRATE 5 MG PO TABS: 5.0000 mg | ORAL_TABLET | Freq: Every evening | ORAL | Status: DC | PRN

## 2013-08-26 MED ORDER — ALPRAZOLAM 0.25 MG PO TABS: 0.2500 mg | ORAL_TABLET | Freq: Two times a day (BID) | ORAL | Status: DC | PRN

## 2013-08-26 MED ORDER — APIXABAN 5 MG PO TABS: 5.0000 mg | ORAL_TABLET | Freq: Two times a day (BID) | ORAL | Status: DC

## 2013-08-26 MED ORDER — ONDANSETRON HCL 4 MG/2ML IJ SOLN: 4.0000 mg | Freq: Four times a day (QID) | INTRAMUSCULAR | Status: DC | PRN

## 2013-08-26 MED ORDER — SODIUM CHLORIDE 0.9 % IV SOLN: 250.0000 mL | INTRAVENOUS | Status: DC | PRN

## 2013-08-26 MED ORDER — ATORVASTATIN CALCIUM 40 MG PO TABS
40.0000 mg | ORAL_TABLET | Freq: Every day | ORAL | Status: DC
  Administered 2013-08-26 – 2013-09-01 (×7): 40 mg via ORAL
  Filled 2013-08-26 (×8): qty 1

## 2013-08-26 MED ORDER — MAGNESIUM OXIDE 400 (241.3 MG) MG PO TABS
200.0000 mg | ORAL_TABLET | Freq: Every day | ORAL | Status: DC
  Administered 2013-08-26 – 2013-09-01 (×7): 200 mg via ORAL
  Filled 2013-08-26 (×8): qty 0.5

## 2013-08-26 MED ORDER — SODIUM CHLORIDE 0.9 % IJ SOLN: 3.0000 mL | INTRAMUSCULAR | Status: DC | PRN

## 2013-08-26 NOTE — Progress Notes (Signed)
Pharmacy Consult for Dofetilide (Tikosyn) Initiation  Admit Complaint: 53 y.o. female admitted 08/26/2013 with atrial fibrillation to be initiated on dofetilide.   Assessment:  Patient Exclusion Criteria: If any screening criteria checked as "Yes", then  patient  should NOT receive dofetilide until criteria item is corrected. If "Yes" please indicate correction plan.  YES  NO Patient  Exclusion Criteria Correction Plan  []  [x]  Baseline QTc interval is greater than or equal to 440 msec. IF above YES box checked dofetilide contraindicated unless patient has ICD; then may proceed if QTc 500-550 msec or with known ventricular conduction abnormalities may proceed with QTc 550-600 msec. QTc =  407   []  [x]  Magnesium level is less than 1.8 mEq/l : Last magnesium:  Lab Results  Component Value Date   MG 2.2 08/26/2013         []  [x]  Potassium level is less than 4 mEq/l : Last potassium:  Lab Results  Component Value Date   K 4.4 08/26/2013         []  []  Patient is known or suspected to have a digoxin level greater than 2 ng/ml: No results found for this basename: DIGOXIN      []  [x]  Creatinine clearance less than 20 ml/min (calculated using Cockcroft-Gault, actual body weight and serum creatinine): Estimated Creatinine Clearance: 89.8 ml/min (by C-G formula based on Cr of 0.7).    []  [x]  Patient has received drugs known to prolong the QT intervals within the last 48 hours(phenothiazines, tricyclics or tetracyclic antidepressants, erythromycin, H-1 antihistamines, cisapride, fluoroquinolones, azithromycin). Drugs not listed above may have an, as yet, undetected potential to prolong the QT interval, updated information on QT prolonging agents is available at this website:QT prolonging agents   []  [x]  Patient received a dose of hydrochlorothiazide (Oretic) alone or in any combination including triamterene (Dyazide, Maxzide) in the last 48 hours.   []  [x]  Patient received a medication known to  increase dofetilide plasma concentrations prior to initial dofetilide dose:    Trimethoprim (Primsol, Proloprim) in the last 36 hours   Verapamil (Calan, Verelan) in the last 36 hours or a sustained release dose in the last 72 hours   Megestrol (Megace) in the last 5 days    Cimetidine (Tagamet) in the last 6 hours   Ketoconazole (Nizoral) in the last 24 hours   Itraconazole (Sporanox) in the last 48 hours    Prochlorperazine (Compazine) in the last 36 hours    []  [x]  Patient is known to have a history of torsades de pointes; congenital or acquired long QT syndromes.   []  [x]  Patient has received a Class 1 antiarrhythmic with less than 2 half-lives since last dose. (Disopyramide, Quinidine, Procainamide, Lidocaine, Mexiletine, Flecainide, Propafenone)   []  [x]  Patient has received amiodarone therapy in the past 3 months or amiodarone level is greater than 0.3 ng/ml.    Patient has been appropriately anticoagulated with Apixaban.  Ordering provider was confirmed at [email or web address] if they are not listed on the [facility] Authorized Prescribers list.  Goal of Therapy:  Follow renal function, electrolytes, potential drug interactions, and dose adjustment. Provide education and 1 week supply at discharge.  Plan:  1.  Initiate dofetilide based on renal function: Select One Calculated CrCl  Dose q12h  [x]  > 60 ml/min 500 mcg  []  40-60 ml/min 250 mcg  []  20-40 ml/min 125 mcg   2. Follow up QTc after the first 5 doses, renal function, electrolytes (K & Mg) daily x 3  days, dose adjustment, success of initiation and facilitate 1 week discharge supply as     clinically indicated.  3. Initiate Tikosyn education video (Call [phone] and ask for video # 116).  4. Place Enrollment Form on the chart for discharge supply of dofetilide.   Mickeal SkinnerFrens, Machele Deihl John 4:56 PM 08/26/2013

## 2013-08-26 NOTE — H&P (Signed)
Patient ID: Kiara [name] MRN: [id number], DOB/AGE: [birth year]   Admit date: 08/26/2013   Primary Physician: Kiara Moh, MD Primary Cardiologist: Dr. Tenny [name] (per pt)  HPI: Patient is a 53 yo female who presented in June this year with persistent afib with RVR and CHF symptoms.  This was her first episode of afib.  Echo showed her LVEF to be 30%. Cardiac cath 07/18/13 showed minimal CAD. She underwent TEE on 6/5 that showed small LA thrombus and she was placed on Eliquis. Repeat TEE 08/18/13 showed resolution and she underwent cardioversion. After cardioversion she says that she felt much better.  Her energy and exercise tolerance were much improved. No chest pressure Had more energy. Friday night and Saturday after her TEE/CV she noted her HR was increased. She continued to feel poorly Sunday. She was seen in the office by Dr Kiara [name] on 08/25/13 and it was decided to admit her today to start Tikosyn. She denies any orthopnea or unusual shortness of breath but she can tell she is in AF and feels "uncomfortable".  Problem List: Past Medical History  Diagnosis Date  . Asthma   . Hyperlipemia   . Obesity   . Osteopenia   . Nephrolithiasis 2013  . Hypothyroidism     past hx  . Depression   . Atrial fibrillation     a. admx with AFib with RVR and acute systolic CHF 07/2013; TEE with LAA clot and no DCCV done;    . NICM (nonischemic cardiomyopathy)     a. Echo (07/17/13):  EF 25-30%, diff HK, basal and mid anteroseptum/ basal inferoseptum and apical AK, trivial AI, mod MR, mod LAE, mild reduced RVSF, mild RAE  . Chronic systolic heart failure   . Coronary artery disease     a.  LHC (07/17/13):  LM 20%, ostial CFX 20-30%    Past Surgical History  Procedure Laterality Date  . Appendectomy  1978  . Abdominal hysterectomy  ~ 2001  . Tee without cardioversion N/A 07/18/2013    Procedure: TRANSESOPHAGEAL ECHOCARDIOGRAM (TEE);  Surgeon: Kiara Bunting, MD;  Location[facility] ENDOSCOPY;  Service:  Cardiovascular;  Laterality: N/A;  . Tee without cardioversion N/A 08/18/2013    Procedure: TRANSESOPHAGEAL ECHOCARDIOGRAM (TEE);  Surgeon: Kiara Schultz, MD;  [facility] ENDOSCOPY;  Service: Cardiovascular;  Laterality: N/A;  . Cardioversion N/A 08/18/2013    Procedure: CARDIOVERSION;  Surgeon: Kiara Schultz, MD;  Location: [facility] ENDOSCOPY;  Service: Cardiovascular;  Laterality: N/A;     Allergies:  Allergies  Allergen Reactions  . Azithromycin Diarrhea and Nausea And Vomiting     Home Medications Current Facility-Administered Medications  Medication Dose Route Frequency Provider Last Rate Last Dose  . 0.9 %  sodium chloride infusion  250 mL Intravenous PRN Pricilla Riffle, MD      . acetaminophen (TYLENOL) tablet 650 mg  650 mg Oral Q6H PRN Abelino Derrick, PA-C      . ALPRAZolam Prudy Feeler) tablet 0.25 mg  0.25 mg Oral BID PRN Abelino Derrick, PA-C      . apixaban (ELIQUIS) tablet 5 mg  5 mg Oral BID Luke K Kilroy, PA-C      . atorvastatin (LIPITOR) tablet 40 mg  40 mg Oral q1800 Eda Paschal Kilroy, PA-C      . furosemide (LASIX) tablet 20 mg  20 mg Oral Daily Luke K Kilroy, PA-C      . lisinopril (PRINIVIL,ZESTRIL) tablet 5 mg  5 mg Oral Daily Abelino Derrick,  PA-C      . metoprolol (LOPRESSOR) tablet 50 mg  50 mg Oral BID Eda PaschalLuke K Kilroy, PA-C      . ondansetron [facility](ZOFRAN) injection 4 mg  4 mg Intravenous Q6H PRN Abelino DerrickLuke K Kilroy, PA-C      . potassium chloride (K-DUR) CR tablet 10 mEq  10 mEq Oral Daily Eda PaschalLuke K Kilroy, [place]PA-C      . [START ON 08/27/2013] sertraline (ZOLOFT) tablet 200 mg  200 mg Oral BH-q7a Luke K [place] City[place]il[place], PA-C      . sodium chloride 0.9 % injection 3 mL  3 mL Intravenous Q12H Pricilla RifflePaula Ross V, MD   3 mL at 08/26/13 1345  . sodium chloride 0.9 % injection 3 mL  3 mL Intravenous PRN Pricilla RifflePaula Ross V, MD      . zolpidem (AMBIEN) tablet 5 mg  5 mg Oral QHS PRN Abelino DerrickLuke K Kilroy, PA-C         Family History  Problem Relation Age of Onset  . Hypertension Mother   . Hypertension Father   . Lymphoma Mother       History   Social History  . Marital Status: Married    Spouse Name: N/A    Number of Children: N/A  . Years of Education: N/A   Occupational History  . Not on file.   Social History Main Topics  . Smoking status: Never Smoker   . Smokeless tobacco: Never Used  . Alcohol Use: 0.6 oz/week    1 Cans of beer per week  . Drug Use: No  . Sexual Activity: Yes   Other Topics Concern  . Not on file   Social History Narrative  . No narrative on file     Review of Systems: General: negative for chills, fever, night sweats or weight changes.  Cardiovascular: edema, orthopnea, Dermatological: negative for rash Respiratory: negative for cough or wheezing Urologic: negative for hematuria Abdominal: negative for nausea, vomiting, diarrhea, bright red blood per rectum, melena, or hematemesis Neurologic: negative for visual changes, syncope, or dizziness All other systems reviewed and are otherwise negative except as noted above.  Physical Exam: Blood pressure 108/74, pulse 112, temperature 97.8 F (36.6 C), temperature source Oral, resp. rate 18, weight 190 lb 9.6 oz (86.456 kg), SpO2 98.00%.  General appearance: alert, cooperative and no distress Neck: no carotid bruit and no JVD Lungs: clear to auscultation bilaterally Heart: irregularly irregular rhythm Abdomen: soft, non-tender; bowel sounds normal; no masses,  no organomegaly Extremities: no edema Pulses: 2+ and symmetric Skin: Skin color, texture, turgor normal. No rashes or lesions Neurologic: Grossly normal    Labs:  No results found for this or any previous visit (from the past 24 hour(s)). She had Labs 08/25/13- K+ 4.2, Mg++ 1.9   Radiology/Studies: No results found.  EKG:AF with VR 93, QTc 407  ASSESSMENT AND PLAN:  Active Problems:   Atrial fibrillation   Chronic systolic heart failure   NICM (nonischemic cardiomyopathy) with EF 25-30%   Moderate mitral regurgitation   Dyslipidemia   History of  asthma   Coronary artery disease   PLAN: Check labs today. Start Tikosyn 500 mcg BID.    Deland PrettySigned, KILROY,LUKE K, PA-C 08/26/2013, 3:28 PM  I have seen, examined the patient, and reviewed the above assessment and plan.  Changes to above are made where necessary.  The patient has persistent afib.  I am concerned that our ability to maintain sinus rhythm is reduced, particular with structural heart changes.  I am  optimistic that if we can achieve/ maintain sinus with tikosyn that her EF and valvular disease may improve.  She is appropriately anticoagulated with eliquis.  Her chads2vasc score is at least 2. I am most concerned that she is on high dose zoloft as well as tikosyn.  She says that she cannot reduce or change her zoloft due to significant difficulty with anxiety and depression.  We will therefore need to follow her QT very closely. If she does not spontaneously convert to sinus by Thursday am, she will require cardioversion on Thursday.  She will need to remain in the hospital until Friday am.     Co Sign: Hillis Range, MD 08/26/2013 5:25 PM

## 2013-08-26 NOTE — Telephone Encounter (Signed)
K- 4.2, Mg- 1.9, renal function is normal - labs acceptable for Tikosyn loading. Patient notified that lab results were okay to proceed with Tikosyn load.  She is going to head to admitting around 12:00 today and be placed on 3W.  Admitting has been notified.  To Dr. Tenny Craw as Lorain Childes.

## 2013-08-26 NOTE — Care Management Note (Signed)
    Page 1 of 2   09/01/2013     11:43:31 AM CARE MANAGEMENT NOTE 09/01/2013  Patient:  KARL, STAUNTON   Account Number:  [id number]  Date Initiated:  08/26/2013  Documentation initiated by:  GRAVES-BIGELOW,Emmaly Leech  Subjective/Objective Assessment:   Pt admitted for Tikosyn Load.     Action/Plan:   Benefits check in process and will make pt aware of co pay once completed. Pt states she has the Tikosyn $10.00 co pay card. Pt uses [facility] pharmacy in [place] [phone]. Pharmacy can order medication.   Anticipated DC Date:  08/29/2013   Anticipated DC Plan:  HOME/SELF CARE      DC Planning Services  CM consult  Medication Assistance      Choice offered to / List presented to:             Status of service:  Completed, signed off Medicare Important Message given?  NO (If response is "NO", the following Medicare IM given date fields will be blank) Date Medicare IM given:   Medicare IM given by:   Date Additional Medicare IM given:   Additional Medicare IM given by:    Discharge Disposition:  HOME/SELF CARE  Per UR Regulation:  Reviewed for med. necessity/level of care/duration of stay  If discussed at Long Length of Stay Meetings, dates discussed:   09/02/2013    Comments:  09-01-13 [address], [place] [phone] In with afib- RVR- originally for Tikosyn load- failed Tikosyn. S/p cardioversion- tikosyn washout and initiated on  amiodarone po and give IV cardizem gtt. Per MD notes: may ultimately require PPM and atrial fib ablation. Plan to aim for rate control for now, and if rate can be controlled, plan DCCV in 3-4 weeks with short term amiodarone and referral for atrial fib ablation. CM will continue to monitor.   08-26-13 [address], [place] [phone] Pt will need a Rx for 7 day supply when medically stable for d/c. CM will assist with the 7 day supply via main pharmacy. Pt will also need original Rx with refills. CM will continue to  monitor.   per rep at [facility]:  tikosyn : auth required [id number] 90 day $110/ 30 day at retail $110 30 $110 at retail/ 90 day  $250  [facility]/ patient can use any pharmacy for 30 day

## 2013-08-27 ENCOUNTER — Ambulatory Visit: Payer: BC Managed Care – PPO | Admitting: Nurse Practitioner

## 2013-08-27 LAB — BASIC METABOLIC PANEL
Anion gap: 12 (ref 5–15)
BUN: 16 mg/dL (ref 6–23)
CO2: 27 mEq/L (ref 19–32)
Calcium: 9.3 mg/dL (ref 8.4–10.5)
Chloride: 106 mEq/L (ref 96–112)
Creatinine, Ser: 0.83 mg/dL (ref 0.50–1.10)
GFR calc Af Amer: >90 mL/min (ref >90)
GFR calc non Af Amer: 78 mL/min — ABNORMAL LOW (ref >90)
Glucose, Bld: 101 mg/dL — ABNORMAL HIGH (ref 70–99)
Potassium: 4.7 mEq/L (ref 3.7–5.3)
Sodium: 145 mEq/L (ref 137–147)

## 2013-08-27 LAB — MAGNESIUM: Magnesium: 2.2 mg/dL (ref 1.5–2.5)

## 2013-08-27 MED ORDER — OFF THE BEAT BOOK
Freq: Once | Status: AC
  Administered 2013-08-27: 09:00:00
  Filled 2013-08-27: qty 1

## 2013-08-27 MED ORDER — METOPROLOL TARTRATE 25 MG PO TABS
25.0000 mg | ORAL_TABLET | Freq: Two times a day (BID) | ORAL | Status: DC
  Administered 2013-08-27 – 2013-08-28 (×2): 25 mg via ORAL
  Filled 2013-08-27 (×4): qty 1

## 2013-08-27 NOTE — Progress Notes (Signed)
UR Completed Oriyah Lamphear Graves-Bigelow, RN,BSN [phone]  

## 2013-08-27 NOTE — Progress Notes (Signed)
Patient Name: Kiara [name]      SUBJECTIVE: CONVERTED TO SINUS OVERNIGHT NOW IN SINUS BRADY Ef 35-40%   Past Medical History  Diagnosis Date  . Asthma   . Hyperlipemia   . Obesity   . Osteopenia   . Hypothyroidism     past hx  . Depression   . Atrial fibrillation     a. admx with AFib with RVR and acute systolic CHF 07/2013; TEE with LAA clot and no DCCV done;    . NICM (nonischemic cardiomyopathy)     a. Echo (07/17/13):  EF 25-30%, diff HK, basal and mid anteroseptum/ basal inferoseptum and apical AK, trivial AI, mod MR, mod LAE, mild reduced RVSF, mild RAE  . Chronic systolic heart failure   . Coronary artery disease     a.  LHC (07/17/13):  LM 20%, ostial CFX 20-30%  . Nephrolithiasis 2013    "passed them"    Scheduled Meds:  Scheduled Meds: . apixaban  5 mg Oral BID  . atorvastatin  40 mg Oral q1800  . dofetilide  500 mcg Oral BID  . furosemide  20 mg Oral Daily  . lisinopril  5 mg Oral Daily  . magnesium oxide  200 mg Oral Daily  . metoprolol  50 mg Oral BID  . off the beat book   Does not apply Once  . potassium chloride  10 mEq Oral Daily  . sertraline  200 mg Oral Q0600  . sodium chloride  3 mL Intravenous Q12H   Continuous Infusions:  sodium chloride, acetaminophen, ALPRAZolam, sodium chloride, zolpidem    PHYSICAL EXAM Filed Vitals:   08/26/13 1510 08/26/13 2036 08/26/13 2103 08/27/13 0434  BP:  97/66  95/48  Pulse:  74 106 58  Temp:  98.9 F (37.2 C)  98 F (36.7 C)  TempSrc:  Oral  Oral  Resp:  14  16  Height: 5\' 7"  (1.702 m)     Weight: 190 lb 9.6 oz (86.456 kg)   189 lb 6.4 oz (85.911 kg)  SpO2:  96%  97%    Well developed and nourished in no acute distress HENT normal Neck supple with JVP-flat Clear Regular rate and rhythm, no murmurs or gallops Abd-soft with active BS No Clubbing cyanosis edema Skin-warm and dry A & Oriented  Grossly normal sensory and motor function   TELEMETRY: Reviewed telemetry pt in  nsr:   No intake or output data in the 24 hours ending 08/27/13 0817  LABS: Basic Metabolic Panel:  Recent Labs Lab 08/22/13 1420  08/25/13 1501 08/26/13 1450 08/27/13 0602  NA 138  --  141 142 145  K 4.1  --  4.2 4.4 4.7  CL 105  --  108 105 106  CO2 26  --  23 21 27   GLUCOSE 97  --  103* 109* 101*  BUN 13  --  11 15 16   CREATININE 0.6  --  0.7 0.70 0.83  CALCIUM 9.6  --  9.2 9.5 9.3  MG  --   < > 1.9 2.2 2.2  < > = values in this interval not displayed. Cardiac Enzymes: No results found for this basename: CKTOTAL, CKMB, CKMBINDEX, TROPONINI,  in the last 72 hours CBC:  Recent Labs Lab 08/25/13 1501  WBC 9.3  NEUTROABS 7.2  HGB 15.2*  HCT 46.0  MCV 87.7  PLT 182.0   PROTIME: No results found for this basename: LABPROT, INR,  in  the last 72 hours Liver Function Tests: No results found for this basename: AST, ALT, ALKPHOS, BILITOT, PROT, ALBUMIN,  in the last 72 hours No results found for this basename: LIPASE, AMYLASE,  in the last 72 hours BNP: BNP (last 3 results)  Recent Labs  07/16/13 1335 08/14/13 1340  PROBNP 1681.0* 118.0*      ASSESSMENT AND PLAN:  Active Problems:   Moderate mitral regurgitation   Dyslipidemia   History of asthma   Minor CAD at cath 07/18/13   Chronic systolic heart failure   NICM (nonischemic cardiomyopathy) with EF 25-30%   Atrial fibrillation WILL DECREASE bb 2/2 Signed, Sherryl Manges MD  08/27/2013

## 2013-08-27 NOTE — Discharge Instructions (Addendum)
Information on my medicine - ELIQUIS (apixaban)  This medication education was reviewed with me or my healthcare representative as part of my discharge preparation.  Why was Eliquis prescribed for you? Eliquis was prescribed for you to reduce the risk of a blood clot forming that can cause a stroke if you have a medical condition called atrial fibrillation (a type of irregular heartbeat).  What do You need to know about Eliquis ? Take your Eliquis TWICE DAILY - one tablet in the morning and one tablet in the evening with or without food. If you have difficulty swallowing the tablet whole please discuss with your pharmacist how to take the medication safely.  Take Eliquis exactly as prescribed by your doctor and DO NOT stop taking Eliquis without talking to the doctor who prescribed the medication.  Stopping may increase your risk of developing a stroke.  Refill your prescription before you run out.  After discharge, you should have regular check-up appointments with your healthcare provider that is prescribing your Eliquis.  In the future your dose may need to be changed if your kidney function or weight changes by a significant amount or as you get older.  What do you do if you miss a dose? If you miss a dose, take it as soon as you remember on the same day and resume taking twice daily.  Do not take more than one dose of ELIQUIS at the same time to make up a missed dose.  Important Safety Information A possible side effect of Eliquis is bleeding. You should call your healthcare provider right away if you experience any of the following:   Bleeding from an injury or your nose that does not stop.   Unusual colored urine (red or dark brown) or unusual colored stools (red or black).   Unusual bruising for unknown reasons.   A serious fall or if you hit your head (even if there is no bleeding).  Some medicines may interact with Eliquis and might increase your risk of bleeding or  clotting while on Eliquis. To help avoid this, consult your healthcare provider or pharmacist prior to using any new prescription or non-prescription medications, including herbals, vitamins, non-steroidal anti-inflammatory drugs (NSAIDs) and supplements.  This website has more information on Eliquis (apixaban): [email or web address].  Atrial Fibrillation Atrial fibrillation is a type of irregular heart rhythm (arrhythmia). During atrial fibrillation, the upper chambers of the heart (atria) quiver continuously in a chaotic pattern. This causes an irregular and often rapid heart rate.  Atrial fibrillation is the result of the heart becoming overloaded with disorganized signals that tell it to beat. These signals are normally released one at a time by a part of the right atrium called the sinoatrial node. They then travel from the atria to the lower chambers of the heart (ventricles), causing the atria and ventricles to contract and pump blood as they pass. In atrial fibrillation, parts of the atria outside of the sinoatrial node also release these signals. This results in two problems. First, the atria receive so many signals that they do not have time to fully contract. Second, the ventricles, which can only receive one signal at a time, beat irregularly and out of rhythm with the atria.  There are three types of atrial fibrillation:   Paroxysmal. Paroxysmal atrial fibrillation starts suddenly and stops on its own within a week.  Persistent. Persistent atrial fibrillation lasts for more than a week. It may stop on its own or with treatment.  Permanent. Permanent  atrial fibrillation does not go away. Episodes of atrial fibrillation may lead to permanent atrial fibrillation. Atrial fibrillation can prevent your heart from pumping blood normally. It increases your risk of stroke and can lead to heart failure.  CAUSES   Heart conditions, including a heart attack, heart failure, coronary artery disease, and  heart valve conditions.   Inflammation of the sac that surrounds the heart (pericarditis).  Blockage of an artery in the lungs (pulmonary embolism).  Pneumonia or other infections.  Chronic lung disease.  Thyroid problems, especially if the thyroid is overactive (hyperthyroidism).  Caffeine, excessive alcohol use, and use of some illegal drugs.   Use of some medicines, including certain decongestants and diet pills.  Heart surgery.   Birth defects.  Sometimes, no cause can be found. When this happens, the atrial fibrillation is called lone atrial fibrillation. The risk of complications from atrial fibrillation increases if you have lone atrial fibrillation and you are age 28 years or older. RISK FACTORS  Heart failure.  Coronary artery disease.  Diabetes mellitus.   High blood pressure (hypertension).   Obesity.   Other arrhythmias.   Increased age. SYMPTOMS   A feeling that your heart is beating rapidly or irregularly.   A feeling of discomfort or pain in your chest.   Shortness of breath.   Sudden light-headedness or weakness.   Getting tired easily when exercising.   Urinating more often than normal (mainly when atrial fibrillation first begins).  In paroxysmal atrial fibrillation, symptoms may start and suddenly stop. DIAGNOSIS  Your health care provider may be able to detect atrial fibrillation when taking your pulse. Your health care provider may have you take a test called an ambulatory electrocardiogram (ECG). An ECG records your heartbeat patterns over a 24-hour period. You may also have other tests, such as:  Transthoracic echocardiogram (TTE). During echocardiography, sound waves are used to evaluate how blood flows through your heart.  Transesophageal echocardiogram (TEE).  Stress test. There is more than one type of stress test. If a stress test is needed, ask your health care provider about which type is best for you.   Chest  X-ray exam.  Blood tests.  Computed tomography (CT). TREATMENT  Treatment may include:  Treating any underlying conditions. For example, if you have an overactive thyroid, treating the condition may correct atrial fibrillation.  Taking medicine. Medicines may be given to control a rapid heart rate or to prevent blood clots, heart failure, or a stroke.  Having a procedure to correct the rhythm of the heart:  Electrical cardioversion. During electrical cardioversion, a controlled, low-energy shock is delivered to the heart through your skin. If you have chest pain, very low blood pressure, or sudden heart failure, this procedure may need to be done as an emergency.  Catheter ablation. During this procedure, heart tissues that send the signals that cause atrial fibrillation are destroyed.  Maze or minimaze procedure. During this surgery, thin lines of heart tissue that carry the abnormal signals are destroyed. The maze procedure is an open-heart surgery. The minimaze procedure is a minimally invasive surgery. This means that small cuts are made to access the heart instead of a large opening.  Pulmonary venous isolation. During this surgery, tissue around the veins that carry blood from the lungs (pulmonary veins) is destroyed. This tissue is thought to carry the abnormal signals. HOME CARE INSTRUCTIONS   Only take medicines that your health care provider approves, and take them as directed. Some medicines can make  atrial fibrillation worse or recur.  If blood thinners were prescribed by your health care provider, take them exactly as directed. Too much blood-thinning medicine can cause bleeding. If you take too little, you will not have the needed protection against stroke and other problems.  Perform blood tests at home if directed by your health care provider. Perform blood tests exactly as directed.  Quit smoking if you smoke.  Do not drink alcohol.  Do not drink caffeinated  beverages such as coffee, soda, and some teas. You may drink decaffeinated coffee, soda, or tea.   Maintain a healthy weight.Do not use diet pills unless your health care provider approves. They may make heart problems worse.   Follow diet instructions as directed by your health care provider.  Exercise regularly as directed by your health care provider.  Keep all follow-up appointments with your health care provider. PREVENTION  The following substances can cause atrial fibrillation to recur:   Caffeinated beverages.  Alcohol.  Certain medicines, especially those used for breathing problems.  Certain herbs and herbal medicines, such as those containing ephedra or ginseng.  Illegal drugs, such as cocaine and amphetamines. Sometimes medicines are given to prevent atrial fibrillation from recurring. Proper treatment of any underlying condition is also important in helping prevent recurrence.  SEEK MEDICAL CARE IF:  You notice a change in the rate, rhythm, or strength of your heartbeat.  You suddenly begin urinating more frequently.  You tire more easily when exerting yourself or exercising. SEEK IMMEDIATE MEDICAL CARE IF:   You have chest pain, abdominal pain, sweating, or weakness.  You feel nauseous.  You have shortness of breath.  You suddenly have swollen feet and ankles.  You feel dizzy.  Your face or limbs feel numb or weak.  You have a change in your vision or speech. MAKE SURE YOU:   Understand these instructions.  Will watch your condition.  Will get help right away if you are not doing well or get worse. Document Released: 01/30/2005 Document Revised: 02/04/2013 Document Reviewed: 03/12/2012 [facility] Patient Information 2015 [place], [place]. This information is not intended to replace advice given to you by your health care provider. Make sure you discuss any questions you have with your health care provider.   Cardiac Diet This diet can help prevent  heart disease and stroke. Many factors influence your heart health, including eating and exercise habits. Coronary risk rises a lot with abnormal blood fat (lipid) levels. Cardiac meal planning includes limiting unhealthy fats, increasing healthy fats, and making other small dietary changes. General guidelines are as follows:  Adjust calorie intake to reach and maintain desirable body weight.  Limit total fat intake to less than 30% of total calories. Saturated fat should be less than 7% of calories.  Saturated fats are found in animal products and in some vegetable products. Saturated vegetable fats are found in coconut oil, cocoa butter, palm oil, and palm kernel oil. Read labels carefully to avoid these products as much as possible. Use butter in moderation. Choose tub margarines and oils that have 2 grams of fat or less. Good cooking oils are canola and olive oils.  Practice low-fat cooking techniques. Do not fry food. Instead, broil, bake, boil, steam, grill, roast on a rack, stir-fry, or microwave it. Other fat reducing suggestions include:  Remove the skin from poultry.  Remove all visible fat from meats.  Skim the fat off stews, soups, and gravies before serving them.  Steam vegetables in water or broth  instead of sauting them in fat.  Avoid foods with trans fat (or hydrogenated oils), such as commercially fried foods and commercially baked goods. Commercial shortening and deep-frying fats will contain trans fat.  Increase intake of fruits, vegetables, whole grains, and legumes to replace foods high in fat.  Increase consumption of nuts, legumes, and seeds to at least 4 servings weekly. One serving of a legume equals  cup, and 1 serving of nuts or seeds equals  cup.  Choose whole grains more often. Have 3 servings per day (a serving is 1 ounce [oz]).  Eat 4 to 5 servings of vegetables per day. A serving of vegetables is 1 cup of raw leafy vegetables;  cup of raw or cooked cut-up  vegetables;  cup of vegetable juice.  Eat 4 to 5 servings of fruit per day. A serving of fruit is 1 medium whole fruit;  cup of dried fruit;  cup of fresh, frozen, or canned fruit;  cup of 100% fruit juice.  Increase your intake of dietary fiber to 20 to 30 grams per day. Insoluble fiber may help lower your risk of heart disease and may help curb your appetite.  Soluble fiber binds cholesterol to be removed from the blood. Foods high in soluble fiber are dried beans, citrus fruits, oats, apples, bananas, broccoli, Brussels sprouts, and eggplant.  Try to include foods fortified with plant sterols or stanols, such as yogurt, breads, juices, or margarines. Choose several fortified foods to achieve a daily intake of 2 to 3 grams of plant sterols or stanols.  Foods with omega-3 fats can help reduce your risk of heart disease. Aim to have a 3.5 oz portion of fatty fish twice per week, such as salmon, mackerel, albacore tuna, sardines, lake trout, or herring. If you wish to take a fish oil supplement, choose one that contains 1 gram of both DHA and EPA.  Limit processed meats to 2 servings (3 oz portion) weekly.  Limit the sodium in your diet to 1500 milligrams (mg) per day. If you have high blood pressure, talk to a registered dietitian about a DASH (Dietary Approaches to Stop Hypertension) eating plan.  Limit sweets and beverages with added sugar, such as soda, to no more than 5 servings per week. One serving is:   1 tablespoon sugar.  1 tablespoon jelly or jam.   cup sorbet.  1 cup lemonade.   cup regular soda. CHOOSING FOODS Starches  Allowed: Breads: All kinds (wheat, rye, raisin, white, oatmeal, [place], [place], and English muffin bread). Low-fat rolls: English muffins, frankfurter and hamburger buns, bagels, pita bread, tortillas (not fried). Pancakes, waffles, biscuits, and muffins made with recommended oil.  Avoid: Products made with saturated or trans fats, oils, or whole  milk products. Butter rolls, cheese breads, croissants. Commercial doughnuts, muffins, sweet rolls, biscuits, waffles, pancakes, store-bought mixes. Crackers  Allowed: Low-fat crackers and snacks: Animal, graham, rye, saltine (with recommended oil, no lard), oyster, and matzo crackers. Bread sticks, melba toast, rusks, flatbread, pretzels, and light popcorn.  Avoid: High-fat crackers: cheese crackers, butter crackers, and those made with coconut, palm oil, or trans fat (hydrogenated oils). Buttered popcorn. Cereals  Allowed: Hot or cold whole-grain cereals.  Avoid: Cereals containing coconut, hydrogenated vegetable fat, or animal fat. Potatoes / Pasta / Rice  Allowed: All kinds of potatoes, rice, and pasta (such as macaroni, spaghetti, and noodles).  Avoid: Pasta or rice prepared with cream sauce or high-fat cheese. Chow mein noodles, [place] fries. Vegetables  Allowed: All vegetables  and vegetable juices.  Avoid: Fried vegetables. Vegetables in cream, butter, or high-fat cheese sauces. Limit coconut. Fruit in cream or custard. Protein  Allowed: Limit your intake of meat, seafood, and poultry to no more than 6 oz (cooked weight) per day. All lean, well-trimmed beef, veal, pork, and lamb. All chicken and [place] without skin. All fish and shellfish. Wild game: wild duck, rabbit, pheasant, and venison. Egg whites or low-cholesterol egg substitutes may be used as desired. Meatless dishes: recipes with dried beans, peas, lentils, and tofu (soybean curd). Seeds and nuts: all seeds and most nuts.  Avoid: Prime grade and other heavily marbled and fatty meats, such as short ribs, spare ribs, rib eye roast or steak, frankfurters, sausage, bacon, and high-fat luncheon meats, mutton. Caviar. Commercially fried fish. Domestic duck, goose, venison sausage. Organ meats: liver, gizzard, heart, chitterlings, brains, kidney, sweetbreads. Dairy  Allowed: Low-fat cheeses: nonfat or low-fat cottage cheese (1%  or 2% fat), cheeses made with part skim milk, such as mozzarella, farmers, string, or ricotta. (Cheeses should be labeled no more than 2 to 6 grams fat per oz.). Skim (or 1%) milk: liquid, powdered, or evaporated. Buttermilk made with low-fat milk. Drinks made with skim or low-fat milk or cocoa. Chocolate milk or cocoa made with skim or low-fat (1%) milk. Nonfat or low-fat yogurt.  Avoid: Whole milk cheeses, including colby, cheddar, [place], [address], [place], [place], [place], [address], [place], and blue. Creamed cottage cheese, cream cheese. Whole milk and whole milk products, including buttermilk or yogurt made from whole milk, drinks made from whole milk. Condensed milk, evaporated whole milk, and 2% milk. Soups and Combination Foods  Allowed: Low-fat low-sodium soups: broth, dehydrated soups, homemade broth, soups with the fat removed, homemade cream soups made with skim or low-fat milk. Low-fat spaghetti, lasagna, chili, and Spanish rice if low-fat ingredients and low-fat cooking techniques are used.  Avoid: Cream soups made with whole milk, cream, or high-fat cheese. All other soups. Desserts and Sweets  Allowed: Sherbet, fruit ices, gelatins, meringues, and angel food cake. Homemade desserts with recommended fats, oils, and milk products. Jam, jelly, honey, marmalade, sugars, and syrups. Pure sugar candy, such as gum drops, hard candy, jelly beans, marshmallows, mints, and small amounts of dark chocolate.  Avoid: Commercially prepared cakes, pies, cookies, frosting, pudding, or mixes for these products. Desserts containing whole milk products, chocolate, coconut, lard, palm oil, or palm kernel oil. Ice cream or ice cream drinks. Candy that contains chocolate, coconut, butter, hydrogenated fat, or unknown ingredients. Buttered syrups. Fats and Oils  Allowed: Vegetable oils: safflower, sunflower, corn, soybean, cottonseed, sesame, canola, olive, or peanut. Non-hydrogenated margarines. Salad  dressing or mayonnaise: homemade or commercial, made with a recommended oil. Low or nonfat salad dressing or mayonnaise.  Limit added fats and oils to 6 to 8 tsp per day (includes fats used in cooking, baking, salads, and spreads on bread). Remember to count the "hidden fats" in foods.  Avoid: Solid fats and shortenings: butter, lard, salt pork, bacon drippings. Gravy containing meat fat, shortening, or suet. Cocoa butter, coconut. Coconut oil, palm oil, palm kernel oil, or hydrogenated oils: these ingredients are often used in bakery products, nondairy creamers, whipped toppings, candy, and commercially fried foods. Read labels carefully. Salad dressings made of unknown oils, sour cream, or cheese, such as blue cheese and Roquefort. Cream, all kinds: half-and-half, light, heavy, or whipping. Sour cream or cream cheese (even if "light" or low-fat). Nondairy cream substitutes: coffee creamers and sour cream substitutes made with  palm, palm kernel, hydrogenated oils, or coconut oil. Beverages  Allowed: Coffee (regular or decaffeinated), tea. Diet carbonated beverages, mineral water. Alcohol: Check with your caregiver. Moderation is recommended.  Avoid: Whole milk, regular sodas, and juice drinks with added sugar. Condiments  Allowed: All seasonings and condiments. Cocoa powder. "Cream" sauces made with recommended ingredients.  Avoid: Carob powder made with hydrogenated fats. SAMPLE MENU Breakfast   cup orange juice   cup oatmeal  1 slice toast  1 tsp margarine  1 cup skim milk Lunch  [place] sandwich with 2 oz [place], 2 slices bread  Lettuce and tomato slices  Fresh fruit  Carrot sticks  Coffee or tea Snack  Fresh fruit or low-fat crackers Dinner  3 oz lean ground beef  1 baked potato  1 tsp margarine   cup asparagus  Lettuce salad  1 tbs non-creamy dressing   cup peach slices  1 cup skim milk Document Released: 11/09/2007 Document Revised: 08/01/2011  Document Reviewed: 04/25/2011 [employer] Patient Information 2015 [place], [place]. This information is not intended to replace advice given to you by your health care provider. Make sure you discuss any questions you have with your health care provider.  Atrial Fibrillation Atrial fibrillation is a type of irregular heart rhythm (arrhythmia). During atrial fibrillation, the upper chambers of the heart (atria) quiver continuously in a chaotic pattern. This causes an irregular and often rapid heart rate.  Atrial fibrillation is the result of the heart becoming overloaded with disorganized signals that tell it to beat. These signals are normally released one at a time by a part of the right atrium called the sinoatrial node. They then travel from the atria to the lower chambers of the heart (ventricles), causing the atria and ventricles to contract and pump blood as they pass. In atrial fibrillation, parts of the atria outside of the sinoatrial node also release these signals. This results in two problems. First, the atria receive so many signals that they do not have time to fully contract. Second, the ventricles, which can only receive one signal at a time, beat irregularly and out of rhythm with the atria.  There are three types of atrial fibrillation:   Paroxysmal. Paroxysmal atrial fibrillation starts suddenly and stops on its own within a week.  Persistent. Persistent atrial fibrillation lasts for more than a week. It may stop on its own or with treatment.  Permanent. Permanent atrial fibrillation does not go away. Episodes of atrial fibrillation may lead to permanent atrial fibrillation. Atrial fibrillation can prevent your heart from pumping blood normally. It increases your risk of stroke and can lead to heart failure.  CAUSES   Heart conditions, including a heart attack, heart failure, coronary artery disease, and heart valve conditions.   Inflammation of the sac that surrounds the heart  (pericarditis).  Blockage of an artery in the lungs (pulmonary embolism).  Pneumonia or other infections.  Chronic lung disease.  Thyroid problems, especially if the thyroid is overactive (hyperthyroidism).  Caffeine, excessive alcohol use, and use of some illegal drugs.   Use of some medicines, including certain decongestants and diet pills.  Heart surgery.   Birth defects.  Sometimes, no cause can be found. When this happens, the atrial fibrillation is called lone atrial fibrillation. The risk of complications from atrial fibrillation increases if you have lone atrial fibrillation and you are age 64 years or older. RISK FACTORS  Heart failure.  Coronary artery disease.  Diabetes mellitus.   High blood pressure (hypertension).  Obesity.   Other arrhythmias.   Increased age. SYMPTOMS   A feeling that your heart is beating rapidly or irregularly.   A feeling of discomfort or pain in your chest.   Shortness of breath.   Sudden light-headedness or weakness.   Getting tired easily when exercising.   Urinating more often than normal (mainly when atrial fibrillation first begins).  In paroxysmal atrial fibrillation, symptoms may start and suddenly stop. DIAGNOSIS  Your health care provider may be able to detect atrial fibrillation when taking your pulse. Your health care provider may have you take a test called an ambulatory electrocardiogram (ECG). An ECG records your heartbeat patterns over a 24-hour period. You may also have other tests, such as:  Transthoracic echocardiogram (TTE). During echocardiography, sound waves are used to evaluate how blood flows through your heart.  Transesophageal echocardiogram (TEE).  Stress test. There is more than one type of stress test. If a stress test is needed, ask your health care provider about which type is best for you.   Chest X-ray exam.  Blood tests.  Computed tomography (CT). TREATMENT  Treatment  may include:  Treating any underlying conditions. For example, if you have an overactive thyroid, treating the condition may correct atrial fibrillation.  Taking medicine. Medicines may be given to control a rapid heart rate or to prevent blood clots, heart failure, or a stroke.  Having a procedure to correct the rhythm of the heart:  Electrical cardioversion. During electrical cardioversion, a controlled, low-energy shock is delivered to the heart through your skin. If you have chest pain, very low blood pressure, or sudden heart failure, this procedure may need to be done as an emergency.  Catheter ablation. During this procedure, heart tissues that send the signals that cause atrial fibrillation are destroyed.  Maze or minimaze procedure. During this surgery, thin lines of heart tissue that carry the abnormal signals are destroyed. The maze procedure is an open-heart surgery. The minimaze procedure is a minimally invasive surgery. This means that small cuts are made to access the heart instead of a large opening.  Pulmonary venous isolation. During this surgery, tissue around the veins that carry blood from the lungs (pulmonary veins) is destroyed. This tissue is thought to carry the abnormal signals. HOME CARE INSTRUCTIONS   Only take medicines that your health care provider approves, and take them as directed. Some medicines can make atrial fibrillation worse or recur.  If blood thinners were prescribed by your health care provider, take them exactly as directed. Too much blood-thinning medicine can cause bleeding. If you take too little, you will not have the needed protection against stroke and other problems.  Perform blood tests at home if directed by your health care provider. Perform blood tests exactly as directed.  Quit smoking if you smoke.  Do not drink alcohol.  Do not drink caffeinated beverages such as coffee, soda, and some teas. You may drink decaffeinated coffee, soda,  or tea.   Maintain a healthy weight.Do not use diet pills unless your health care provider approves. They may make heart problems worse.   Follow diet instructions as directed by your health care provider.  Exercise regularly as directed by your health care provider.  Keep all follow-up appointments with your health care provider. PREVENTION  The following substances can cause atrial fibrillation to recur:   Caffeinated beverages.  Alcohol.  Certain medicines, especially those used for breathing problems.  Certain herbs and herbal medicines, such as those containing ephedra  or ginseng.  Illegal drugs, such as cocaine and amphetamines. Sometimes medicines are given to prevent atrial fibrillation from recurring. Proper treatment of any underlying condition is also important in helping prevent recurrence.  SEEK MEDICAL CARE IF:  You notice a change in the rate, rhythm, or strength of your heartbeat.  You suddenly begin urinating more frequently.  You tire more easily when exerting yourself or exercising. SEEK IMMEDIATE MEDICAL CARE IF:   You have chest pain, abdominal pain, sweating, or weakness.  You feel nauseous.  You have shortness of breath.  You suddenly have swollen feet and ankles.  You feel dizzy.  Your face or limbs feel numb or weak.  You have a change in your vision or speech. MAKE SURE YOU:   Understand these instructions.  Will watch your condition.  Will get help right away if you are not doing well or get worse. Document Released: 01/30/2005 Document Revised: 02/04/2013 Document Reviewed: 03/12/2012 [facility] Patient Information 2015 [place], [place]. This information is not intended to replace advice given to you by your health care provider. Make sure you discuss any questions you have with your health care provider.

## 2013-08-28 DIAGNOSIS — R001 Bradycardia, unspecified: Secondary | ICD-10-CM

## 2013-08-28 HISTORY — DX: Bradycardia, unspecified: R00.1

## 2013-08-28 LAB — BASIC METABOLIC PANEL
Anion gap: 13 (ref 5–15)
BUN: 15 mg/dL (ref 6–23)
CO2: 25 mEq/L (ref 19–32)
Calcium: 9.1 mg/dL (ref 8.4–10.5)
Chloride: 104 mEq/L (ref 96–112)
Creatinine, Ser: 0.71 mg/dL (ref 0.50–1.10)
GFR calc Af Amer: >90 mL/min (ref >90)
GFR calc non Af Amer: >90 mL/min (ref >90)
Glucose, Bld: 96 mg/dL (ref 70–99)
Potassium: 4.2 mEq/L (ref 3.7–5.3)
Sodium: 142 mEq/L (ref 137–147)

## 2013-08-28 LAB — MAGNESIUM: Magnesium: 2.1 mg/dL (ref 1.5–2.5)

## 2013-08-28 NOTE — Progress Notes (Signed)
Patient Name: Kiara GowdaJan O Jares      SUBJECTIVE: admitted for dofetilide and converted to sinus on d 1  Past Medical History  Diagnosis Date  . Asthma   . Hyperlipemia   . Obesity   . Osteopenia   . Hypothyroidism     past hx  . Depression   . Atrial fibrillation     a. admx with AFib with RVR and acute systolic CHF 07/2013; TEE with LAA clot and no DCCV done;    . NICM (nonischemic cardiomyopathy)     a. Echo (07/17/13):  EF 25-30%, diff HK, basal and mid anteroseptum/ basal inferoseptum and apical AK, trivial AI, mod MR, mod LAE, mild reduced RVSF, mild RAE  . Chronic systolic heart failure   . Coronary artery disease     a.  LHC (07/17/13):  LM 20%, ostial CFX 20-30%  . Nephrolithiasis 2013    "passed them"    Scheduled Meds:  Scheduled Meds: . apixaban  5 mg Oral BID  . atorvastatin  40 mg Oral q1800  . dofetilide  500 mcg Oral BID  . furosemide  20 mg Oral Daily  . lisinopril  5 mg Oral Daily  . magnesium oxide  200 mg Oral Daily  . potassium chloride  10 mEq Oral Daily  . sertraline  200 mg Oral Q0600  . sodium chloride  3 mL Intravenous Q12H   Continuous Infusions:  sodium chloride, acetaminophen, ALPRAZolam, sodium chloride, zolpidem    PHYSICAL EXAM Filed Vitals:   08/27/13 2127 08/28/13 0602 08/28/13 0927 08/28/13 1329  BP: 111/65 102/50 97/49 104/61  Pulse: 61 55 51 46  Temp: 98.2 F (36.8 C) 97.5 F (36.4 C)  97.6 F (36.4 C)  TempSrc:  Oral  Oral  Resp: 16 18  16   Height:      Weight:  189 lb 6.4 oz (85.911 kg)    SpO2: 98% 98%  98%   Well developed and nourished in no acute distress HENT normal Neck supple with JVP-flat Clear Slow but Regular rate and rhythm, no murmurs or gallops Abd-soft with active BS No Clubbing cyanosis edema Skin-warm and dry A & Oriented  Grossly normal sensory and motor function   TELEMETRY: Reviewed telemetry pt in *sinus brady    Intake/Output Summary (Last 24 hours) at 08/28/13 1506 Last data  filed at 08/28/13 0857  Gross per 24 hour  Intake    243 ml  Output      0 ml  Net    243 ml    LABS: Basic Metabolic Panel:  Recent Labs Lab 08/22/13 1420  08/25/13 1501 08/26/13 1450 08/27/13 0602 08/28/13 0610  NA 138  --  141 142 145 142  K 4.1  --  4.2 4.4 4.7 4.2  CL 105  --  108 105 106 104  CO2 26  --  23 21 27 25   GLUCOSE 97  --  103* 109* 101* 96  BUN 13  --  11 15 16 15   CREATININE 0.6  --  0.7 0.70 0.83 0.71  CALCIUM 9.6  --  9.2 9.5 9.3 9.1  MG  --   < > 1.9 2.2 2.2 2.1  < > = values in this interval not displayed. Cardiac Enzymes: No results found for this basename: CKTOTAL, CKMB, CKMBINDEX, TROPONINI,  in the last 72 hours CBC:  Recent Labs Lab 08/25/13 1501  WBC 9.3  NEUTROABS 7.2  HGB 15.2*  HCT 46.0  MCV 87.7  PLT 182.0   PROTIME: No results found for this basename: LABPROT, INR,  in the last 72 hours Liver Function Tests: No results found for this basename: AST, ALT, ALKPHOS, BILITOT, PROT, ALBUMIN,  in the last 72 hours No results found for this basename: LIPASE, AMYLASE,  in the last 72 hours BNP: BNP (last 3 results)  Recent Labs  07/16/13 1335 08/14/13 1340  PROBNP 1681.0* 118.0*    Device Interrogation:    ASSESSMENT AND PLAN:  Principal Problem:   Atrial fibrillation Active Problems:   Sinus bradycardia   NICM (nonischemic cardiomyopathy) with EF 25-30%   Moderate mitral regurgitation   Dyslipidemia   History of asthma   Minor CAD at cath 07/18/13   Chronic systolic heart failure  Lengthy discussion re options Will continue dofetilide She would like to pursue RFCA as primary therapy Will plan  Discharge in am Stop metoprolol 2/2 bradycardia  Perhaps we can start lowdose carvedilol later Will need dofetilide followup in 10 days anticpate reevaluation of LV function in about 6 weeks Schedule appt with JA in 6-8 weeks to discuss ablation  Signed, Sherryl Manges MD  08/28/2013

## 2013-08-29 ENCOUNTER — Other Ambulatory Visit: Payer: Self-pay

## 2013-08-29 ENCOUNTER — Encounter (HOSPITAL_COMMUNITY)
Admission: AD | Disposition: A | Discharge status: Home / Self Care | Payer: Self-pay | Source: Ambulatory Visit | Attending: Internal Medicine

## 2013-08-29 ENCOUNTER — Encounter (HOSPITAL_COMMUNITY): Payer: BC Managed Care – PPO | Admitting: Certified Registered"

## 2013-08-29 ENCOUNTER — Inpatient Hospital Stay (HOSPITAL_COMMUNITY): Payer: BC Managed Care – PPO | Admitting: Certified Registered"

## 2013-08-29 DIAGNOSIS — E785 Hyperlipidemia, unspecified: Secondary | ICD-10-CM

## 2013-08-29 HISTORY — PX: CARDIOVERSION: SHX1299

## 2013-08-29 LAB — BASIC METABOLIC PANEL
Anion gap: 14 (ref 5–15)
BUN: 15 mg/dL (ref 6–23)
CO2: 25 mEq/L (ref 19–32)
Calcium: 9.4 mg/dL (ref 8.4–10.5)
Chloride: 104 mEq/L (ref 96–112)
Creatinine, Ser: 0.75 mg/dL (ref 0.50–1.10)
GFR calc Af Amer: >90 mL/min (ref >90)
GFR calc non Af Amer: >90 mL/min (ref >90)
Glucose, Bld: 94 mg/dL (ref 70–99)
Potassium: 4.2 mEq/L (ref 3.7–5.3)
Sodium: 143 mEq/L (ref 137–147)

## 2013-08-29 LAB — MAGNESIUM: Magnesium: 2.2 mg/dL (ref 1.5–2.5)

## 2013-08-29 SURGERY — CARDIOVERSION
Anesthesia: General

## 2013-08-29 MED ORDER — SODIUM CHLORIDE 0.9 % IJ SOLN
3.0000 mL | Freq: Two times a day (BID) | INTRAMUSCULAR | Status: DC
  Administered 2013-08-30 – 2013-09-02 (×5): 3 mL via INTRAVENOUS

## 2013-08-29 MED ORDER — SODIUM CHLORIDE 0.9 % IV SOLN: 250.0000 mL | INTRAVENOUS | Status: DC

## 2013-08-29 MED ORDER — METOPROLOL TARTRATE 1 MG/ML IV SOLN
5.0000 mg | INTRAVENOUS | Status: DC | PRN
  Administered 2013-08-29 – 2013-08-30 (×2): 5 mg via INTRAVENOUS
  Filled 2013-08-29 (×2): qty 5

## 2013-08-29 MED ORDER — SODIUM CHLORIDE 0.9 % IV SOLN
INTRAVENOUS | Status: DC
  Administered 2013-09-01: 12:00:00 via INTRAVENOUS

## 2013-08-29 MED ORDER — SODIUM CHLORIDE 0.9 % IJ SOLN: 3.0000 mL | INTRAMUSCULAR | Status: DC | PRN

## 2013-08-29 MED ORDER — PROPOFOL 10 MG/ML IV BOLUS
INTRAVENOUS | Status: DC | PRN
  Administered 2013-08-29: 100 mg via INTRAVENOUS

## 2013-08-29 NOTE — Progress Notes (Signed)
Pt felt heart beating fast. EKG obtained- afib. MD made aware.

## 2013-08-29 NOTE — Interval H&P Note (Signed)
History and Physical Interval Note:  08/29/2013 9:00 AM  Kiara [name]  has presented today for surgery, with the diagnosis of a fib  The various methods of treatment have been discussed with the patient and family. After consideration of risks, benefits and other options for treatment, the patient has consented to  Procedure(s): CARDIOVERSION (N/A) as a surgical intervention .  The patient's history has been reviewed, patient examined, no change in status, stable for surgery.  I have reviewed the patient's chart and labs.  Questions were answered to the patient's satisfaction.     Dietrich Pates

## 2013-08-29 NOTE — Progress Notes (Signed)
   ELECTROPHYSIOLOGY ROUNDING NOTE    Patient Name: Kiara [name] Date of Encounter: 08/29/2013    SUBJECTIVE: Patient with palpitations and chest soreness.  No shortness of breath.  Admitted 08-29-13 for Tikosyn loading.  Reverted to SR after 1st dose, back to afib last night.  Labs reviewed.  QTc stable.  TELEMETRY: Reviewed telemetry pt with reversion to atrial fibrillation with RVR Filed Vitals:   08/28/13 0927 08/28/13 1329 08/28/13 2100 08/29/13 0300  BP: 97/49 104/61 106/54 117/82  Pulse: 51 46 56 98  Temp:  97.6 F (36.4 C) 98 F (36.7 C) 97.6 F (36.4 C)  TempSrc:  Oral Oral Oral  Resp:  16 16 16   Height:      Weight:      SpO2:  98% 98% 94%    Intake/Output Summary (Last 24 hours) at 08/29/13 0711 Last data filed at 08/28/13 0857  Gross per 24 hour  Intake    240 ml  Output      0 ml  Net    240 ml    CURRENT MEDICATIONS: . apixaban  5 mg Oral BID  . atorvastatin  40 mg Oral q1800  . dofetilide  500 mcg Oral BID  . furosemide  20 mg Oral Daily  . lisinopril  5 mg Oral Daily  . magnesium oxide  200 mg Oral Daily  . potassium chloride  10 mEq Oral Daily  . sertraline  200 mg Oral Q0600  . sodium chloride  3 mL Intravenous Q12H    LABS: Basic Metabolic Panel:  Recent Labs  19/80/22 0610 08/29/13 0328  NA 142 143  K 4.2 4.2  CL 104 104  CO2 25 25  GLUCOSE 96 94  BUN 15 15  CREATININE 0.71 0.75  CALCIUM 9.1 9.4  MG 2.1 2.2     PHYSICAL EXAM Well developed and nourished in no acute distress HENT normal Neck supple with JVP-flat Carotids brisk and full without bruits Clear Irregularly irregular rate and rhythm with rapid ventricular response, no murmurs or gallops Abd-soft with active BS without hepatomegaly No Clubbing cyanosis edema Skin-warm and dry A & Oriented  Grossly normal sensory and motor function       Principal Problem:   Atrial fibrillation Active Problems:   Moderate mitral regurgitation   Dyslipidemia   History  of asthma   Minor CAD at cath 07/18/13   Chronic systolic heart failure   NICM (nonischemic cardiomyopathy) with EF 25-30%   Sinus bradycardia   Recurrent atrial fibrillation  Will try and cardiovert again and see what happens  Hopefully sinus will beget sufficient sinuc  Will need referral to [facility] for RFCA  Her bradycardia precludes other easy drug option

## 2013-08-29 NOTE — Progress Notes (Signed)
Pt back into afib, aflutter, rate 130, c/o being shaky, md paged.

## 2013-08-29 NOTE — Anesthesia Preprocedure Evaluation (Addendum)
Anesthesia Evaluation  Patient identified by MRN, date of birth, ID band Patient awake    Airway Mallampati: I TM Distance: >3 FB Neck ROM: Full    Dental  (+) Teeth Intact, Dental Advisory Given   Pulmonary asthma ,          Cardiovascular + CAD + dysrhythmias Atrial Fibrillation     Neuro/Psych PSYCHIATRIC DISORDERS Depression    GI/Hepatic   Endo/Other  Hypothyroidism   Renal/GU Renal disease     Musculoskeletal   Abdominal   Peds  Hematology   Anesthesia Other Findings   Reproductive/Obstetrics                          Anesthesia Physical Anesthesia Plan  ASA: III  Anesthesia Plan: General   Post-op Pain Management:    Induction: Intravenous  Airway Management Planned: Mask  Additional Equipment:   Intra-op Plan:   Post-operative Plan:   Informed Consent: I have reviewed the patients History and Physical, chart, labs and discussed the procedure including the risks, benefits and alternatives for the proposed anesthesia with the patient or authorized representative who has indicated his/her understanding and acceptance.     Plan Discussed with: CRNA and Surgeon  Anesthesia Plan Comments:        Anesthesia Quick Evaluation

## 2013-08-29 NOTE — Transfer of Care (Signed)
Immediate Anesthesia Transfer of Care Note  Patient: Kiara [name]  Procedure(s) Performed: Procedure(s): CARDIOVERSION (N/A)  Patient Location: Nursing Unit  Anesthesia Type:General  Level of Consciousness: awake, alert  and oriented  Airway & Oxygen Therapy: Patient Spontanous Breathing  Post-op Assessment: Report given to PACU RN, Post -op Vital signs reviewed and stable and Patient moving all extremities X 4  Post vital signs: Reviewed and stable  Complications: No apparent anesthesia complications

## 2013-08-29 NOTE — CV Procedure (Signed)
Patient anesthetized with Propofol IV by anesthesia With pads in AP position, patient cardioverted to Sinus bradycardia with 200 J synchronized biphasic energy. Procedure without complication.

## 2013-08-29 NOTE — Anesthesia Postprocedure Evaluation (Signed)
  Anesthesia Post-op Note  Patient: Kiara [name]  Procedure(s) Performed: Procedure(s): CARDIOVERSION (N/A)  Patient Location: Nursing Unit  Anesthesia Type:General  Level of Consciousness: awake, alert  and oriented  Airway and Oxygen Therapy: Patient Spontanous Breathing  Post-op Pain: none  Post-op Assessment: Post-op Vital signs reviewed, Patient's Cardiovascular Status Stable, Respiratory Function Stable, Patent Airway and No signs of Nausea or vomiting  Post-op Vital Signs: Reviewed and stable  Last Vitals:  Filed Vitals:   08/29/13 0300  BP: 117/82  Pulse: 98  Temp: 36.4 C  Resp: 16    Complications: No apparent anesthesia complications

## 2013-08-30 MED ORDER — DILTIAZEM HCL 100 MG IV SOLR
5.0000 mg/h | INTRAVENOUS | Status: DC
  Administered 2013-08-30: 5 mg/h via INTRAVENOUS
  Filled 2013-08-30: qty 100

## 2013-08-30 NOTE — Progress Notes (Signed)
Patient ID: Kiara [name], female   DOB: 1960-07-23, 53 y.o.   MRN: [id number]   Patient Name: Kiara [name] Date of Encounter: 08/30/2013     Principal Problem:   Atrial fibrillation Active Problems:   Moderate mitral regurgitation   Dyslipidemia   History of asthma   Minor CAD at cath 07/18/13   Chronic systolic heart failure   NICM (nonischemic cardiomyopathy) with EF 25-30%   Sinus bradycardia    SUBJECTIVE  " I woke up this morning and my heart was out of rhythm" denies chest pain or sob.  CURRENT MEDS . apixaban  5 mg Oral BID  . atorvastatin  40 mg Oral q1800  . dofetilide  500 mcg Oral BID  . furosemide  20 mg Oral Daily  . lisinopril  5 mg Oral Daily  . magnesium oxide  200 mg Oral Daily  . potassium chloride  10 mEq Oral Daily  . sertraline  200 mg Oral Q0600  . sodium chloride  3 mL Intravenous Q12H  . sodium chloride  3 mL Intravenous Q12H    OBJECTIVE  Filed Vitals:   08/29/13 2327 08/30/13 0042 08/30/13 0300 08/30/13 0500  BP: 125/81   104/52  Pulse: 109 56 55 52  Temp:    98.1 F (36.7 C)  TempSrc:    Oral  Resp:    18  Height:      Weight:      SpO2:    98%   No intake or output data in the 24 hours ending 08/30/13 0953 Filed Weights   08/26/13 1510 08/27/13 0434 08/28/13 0602  Weight: 190 lb 9.6 oz (86.456 kg) 189 lb 6.4 oz (85.911 kg) 189 lb 6.4 oz (85.911 kg)    PHYSICAL EXAM  General: Pleasant, middle aged woman, NAD. Neuro: Alert and oriented X 3. Moves all extremities spontaneously. HEENT:  Normal  Neck: Supple without bruits or JVD. Lungs:  Resp regular and unlabored, CTA. Heart: IRIR tachycardia no s3, s4, or murmurs. Abdomen: Soft, non-tender, non-distended, BS + x 4.  Extremities: No clubbing, cyanosis or edema. DP/PT/Radials 2+ and equal bilaterally.  Accessory Clinical Findings  CBC No results found for this basename: WBC, NEUTROABS, HGB, HCT, MCV, PLT,  in the last 72 hours Basic Metabolic Panel  Recent Labs  08/28/13 0610 08/29/13 0328  NA 142 143  K 4.2 4.2  CL 104 104  CO2 25 25  GLUCOSE 96 94  BUN 15 15  CREATININE 0.71 0.75  CALCIUM 9.1 9.4  MG 2.1 2.2   Liver Function Tests No results found for this basename: AST, ALT, ALKPHOS, BILITOT, PROT, ALBUMIN,  in the last 72 hours No results found for this basename: LIPASE, AMYLASE,  in the last 72 hours Cardiac Enzymes No results found for this basename: CKTOTAL, CKMB, CKMBINDEX, TROPONINI,  in the last 72 hours BNP No components found with this basename: POCBNP,  D-Dimer No results found for this basename: DDIMER,  in the last 72 hours Hemoglobin A1C No results found for this basename: HGBA1C,  in the last 72 hours Fasting Lipid Panel No results found for this basename: CHOL, HDL, LDLCALC, TRIG, CHOLHDL, LDLDIRECT,  in the last 72 hours Thyroid Function Tests No results found for this basename: TSH, T4TOTAL, FREET3, T3FREE, THYROIDAB,  in the last 72 hours  TELE  Atrial fib with an RVR  Radiology/Studies  No results found.  ASSESSMENT AND PLAN 1. Atrial fib with an RVR, refractory to Tikosyn 2. Tachy induced  cm, EF 30-45% 3. Sinus node dysfunction 4. Mitral regurgitation Rec: a very difficult constellation of problems. At this point she has failed Tikosyn and we will stop this drug. I have discussed the options in detail with the patient. Will give IV cardizem today for rate control. Will plan to start low dose amiodarone tomorrow. The success of catheter ablation is markedly better if the patient has maintained NSR for several months. She might ultimately require PPM for backup rate control. I have reviewed all of this with the patient in detail.  Will hold off on discharge with her uncontrolled VR in atrial fib.  Raeanne BarryGregg Taylor,M.D  Gregg Taylor,M.D.  08/30/2013 9:53 AM

## 2013-08-30 NOTE — Progress Notes (Signed)
Dr. Adolm Joseph in room to assess pt, vs as documented,  ivp lopressor administered x 1 dose , heart rate down to 109.fluctuating.  Remains in Afib/flutter.

## 2013-08-30 NOTE — Progress Notes (Signed)
Pt converted to SB rate 56.

## 2013-08-30 NOTE — Progress Notes (Signed)
Pt back in Afib HR 130

## 2013-08-31 MED ORDER — DILTIAZEM HCL 100 MG IV SOLR
5.0000 mg/h | INTRAVENOUS | Status: DC
  Administered 2013-08-31: 5 mg/h via INTRAVENOUS
  Administered 2013-08-31: 10 mg/h via INTRAVENOUS
  Administered 2013-09-01: 5 mg/h via INTRAVENOUS
  Filled 2013-08-31 (×3): qty 100

## 2013-08-31 MED ORDER — AMIODARONE HCL 200 MG PO TABS
200.0000 mg | ORAL_TABLET | Freq: Three times a day (TID) | ORAL | Status: DC
  Administered 2013-08-31 – 2013-09-01 (×4): 200 mg via ORAL
  Filled 2013-08-31 (×6): qty 1

## 2013-08-31 NOTE — Progress Notes (Signed)
Patient ID: Kiara [name], female   DOB: February 16, 1960, 53 y.o.   MRN: [id number]   Patient Name: Kiara [name] Date of Encounter: 08/31/2013     Principal Problem:   Atrial fibrillation Active Problems:   Moderate mitral regurgitation   Dyslipidemia   History of asthma   Minor CAD at cath 07/18/13   Chronic systolic heart failure   NICM (nonischemic cardiomyopathy) with EF 25-30%   Sinus bradycardia    SUBJECTIVE Denies chest pain or sob. Feels palpitations.  CURRENT MEDS . apixaban  5 mg Oral BID  . atorvastatin  40 mg Oral q1800  . furosemide  20 mg Oral Daily  . lisinopril  5 mg Oral Daily  . magnesium oxide  200 mg Oral Daily  . potassium chloride  10 mEq Oral Daily  . sertraline  200 mg Oral Q0600  . sodium chloride  3 mL Intravenous Q12H  . sodium chloride  3 mL Intravenous Q12H    OBJECTIVE  Filed Vitals:   08/30/13 1641 08/30/13 2000 08/31/13 0400 08/31/13 0700  BP: 97/63 94/55 82/50  94/52  Pulse: 62 58 55   Temp: 98.4 F (36.9 C) 97.6 F (36.4 C) 97.6 F (36.4 C)   TempSrc:      Resp:  16 16   Height:      Weight:   191 lb 6.4 oz (86.818 kg)   SpO2: 93% 98% 97%     Intake/Output Summary (Last 24 hours) at 08/31/13 0905 Last data filed at 08/30/13 2100  Gross per 24 hour  Intake    723 ml  Output      0 ml  Net    723 ml   Filed Weights   08/27/13 0434 08/28/13 0602 08/31/13 0400  Weight: 189 lb 6.4 oz (85.911 kg) 189 lb 6.4 oz (85.911 kg) 191 lb 6.4 oz (86.818 kg)    PHYSICAL EXAM  General: Pleasant, NAD. Neuro: Alert and oriented X 3. Moves all extremities spontaneously. HEENT:  Normal  Neck: Supple without bruits or JVD. Lungs:  Resp regular and unlabored, CTA. Heart: RRR no s3, s4, or murmurs. Abdomen: Soft, non-tender, non-distended, BS + x 4.  Extremities: No clubbing, cyanosis or edema. DP/PT/Radials 2+ and equal bilaterally.  Accessory Clinical Findings  CBC No results found for this basename: WBC, NEUTROABS, HGB, HCT, MCV, PLT,   in the last 72 hours Basic Metabolic Panel  Recent Labs  08/29/13 0328  NA 143  K 4.2  CL 104  CO2 25  GLUCOSE 94  BUN 15  CREATININE 0.75  CALCIUM 9.4  MG 2.2   Liver Function Tests No results found for this basename: AST, ALT, ALKPHOS, BILITOT, PROT, ALBUMIN,  in the last 72 hours No results found for this basename: LIPASE, AMYLASE,  in the last 72 hours Cardiac Enzymes No results found for this basename: CKTOTAL, CKMB, CKMBINDEX, TROPONINI,  in the last 72 hours BNP No components found with this basename: POCBNP,  D-Dimer No results found for this basename: DDIMER,  in the last 72 hours Hemoglobin A1C No results found for this basename: HGBA1C,  in the last 72 hours Fasting Lipid Panel No results found for this basename: CHOL, HDL, LDLCALC, TRIG, CHOLHDL, LDLDIRECT,  in the last 72 hours Thyroid Function Tests No results found for this basename: TSH, T4TOTAL, FREET3, T3FREE, THYROIDAB,  in the last 72 hours  TELE Atrial fib with an RVR  Radiology/Studies  No results found.  ASSESSMENT AND PLAN 1.atrial fib with  an RVR, failed tikosyn 2. tacy induced CM 3. Sinus node dysfunction Rec: At this point she has had 24 hours to allow for tikosyn to wash out. Will start amiodarone and give IV cardizem. I have explained that she may ultimately require PPM and atrial fib ablation. Hopefully not. We will aim for rate control for now, and if rate can be controlled, plan DCCV in 3-4 weeks with short term amiodarone and referral for atrial fib ablation.   Gregg Taylor,M.D.  08/31/2013 9:05 AM

## 2013-09-01 LAB — TROPONIN I: Troponin I: <0.3 ng/mL (ref <0.30)

## 2013-09-01 MED ORDER — AMIODARONE HCL 200 MG PO TABS
400.0000 mg | ORAL_TABLET | Freq: Two times a day (BID) | ORAL | Status: DC
  Administered 2013-09-01 – 2013-09-02 (×2): 400 mg via ORAL
  Filled 2013-09-01 (×3): qty 2

## 2013-09-01 MED ORDER — ALUM & MAG HYDROXIDE-SIMETH 200-200-20 MG/5ML PO SUSP
30.0000 mL | ORAL | Status: DC | PRN
  Administered 2013-09-01: 30 mL via ORAL
  Filled 2013-09-01: qty 30

## 2013-09-01 MED ORDER — METOPROLOL TARTRATE 25 MG PO TABS
25.0000 mg | ORAL_TABLET | Freq: Two times a day (BID) | ORAL | Status: DC
  Administered 2013-09-01 – 2013-09-02 (×2): 25 mg via ORAL
  Filled 2013-09-01 (×3): qty 1

## 2013-09-01 NOTE — Progress Notes (Addendum)
Patient Name: Kiara [name]      SUBJECTIVE: still with palps No clear reason why so resistant  Past Medical History  Diagnosis Date  . Asthma   . Hyperlipemia   . Obesity   . Osteopenia   . Hypothyroidism     past hx  . Depression   . Atrial fibrillation     a. admx with AFib with RVR and acute systolic CHF 07/2013; TEE with LAA clot and no DCCV done;    . NICM (nonischemic cardiomyopathy)     a. Echo (07/17/13):  EF 25-30%, diff HK, basal and mid anteroseptum/ basal inferoseptum and apical AK, trivial AI, mod MR, mod LAE, mild reduced RVSF, mild RAE  . Chronic systolic heart failure   . Coronary artery disease     a.  LHC (07/17/13):  LM 20%, ostial CFX 20-30%  . Nephrolithiasis 2013    "passed them"    Scheduled Meds:  Scheduled Meds: . amiodarone  200 mg Oral TID  . apixaban  5 mg Oral BID  . atorvastatin  40 mg Oral q1800  . furosemide  20 mg Oral Daily  . lisinopril  5 mg Oral Daily  . magnesium oxide  200 mg Oral Daily  . potassium chloride  10 mEq Oral Daily  . sertraline  200 mg Oral Q0600  . sodium chloride  3 mL Intravenous Q12H  . sodium chloride  3 mL Intravenous Q12H   Continuous Infusions: . sodium chloride    . sodium chloride 10 mL/hr at 09/01/13 1208  . diltiazem (CARDIZEM) infusion 5 mg/hr (09/01/13 0410)   sodium chloride, acetaminophen, ALPRAZolam, metoprolol, sodium chloride, sodium chloride, zolpidem    PHYSICAL EXAM Filed Vitals:   09/01/13 0000 09/01/13 0400 09/01/13 0818 09/01/13 1047  BP: 110/79 105/63 97/67 103/58  Pulse: 58 73 92   Temp: 97.4 F (36.3 C) 98.3 F (36.8 C) 97.7 F (36.5 C)   TempSrc:   Oral   Resp: 16 18 18    Height:      Weight:  190 lb 14.4 oz (86.592 kg)    SpO2: 99% 98% 94%     Well developed and nourished in no acute distress HENT normal Neck supple with JVP-flat Carotids brisk and full without bruits Clear Irregularly irregular rate and rhythm with controlled ventricular response, no  murmurs or gallops Abd-soft with active BS without hepatomegaly No Clubbing cyanosis edema Skin-warm and dry A & Oriented  Grossly normal sensory and motor function   TELEMETRY: Reviewed telemetry pt in afib:    Intake/Output Summary (Last 24 hours) at 09/01/13 1212 Last data filed at 08/31/13 2100  Gross per 24 hour  Intake    720 ml  Output      0 ml  Net    720 ml    LABS: Basic Metabolic Panel:  Recent Labs Lab 08/25/13 1501 08/26/13 1450 08/27/13 0602 08/28/13 0610 08/29/13 0328  NA 141 142 145 142 143  K 4.2 4.4 4.7 4.2 4.2  CL 108 105 106 104 104  CO2 23 21 27 25 25   GLUCOSE 103* 109* 101* 96 94  BUN 11 15 16 15 15   CREATININE 0.7 0.70 0.83 0.71 0.75  CALCIUM 9.2 9.5 9.3 9.1 9.4  MG 1.9 2.2 2.2 2.1 2.2   Cardiac Enzymes: No results found for this basename: CKTOTAL, CKMB, CKMBINDEX, TROPONINI,  in the last 72 hours CBC:  Recent Labs Lab 08/25/13 1501  WBC 9.3  NEUTROABS 7.2  HGB 15.2*  HCT 46.0  MCV 87.7  PLT 182.0   PROTIME: No results found for this basename: LABPROT, INR,  in the last 72 hours Liver Function Tests: No results found for this basename: AST, ALT, ALKPHOS, BILITOT, PROT, ALBUMIN,  in the last 72 hours No results found for this basename: LIPASE, AMYLASE,  in the last 72 hours BNP: BNP (last 3 results)  Recent Labs  07/16/13 1335 08/14/13 1340  PROBNP 1681.0* 118.0*   :    ASSESSMENT AND PLAN:  Principal Problem:   Atrial fibrillation Active Problems:   Sinus bradycardia   NICM (nonischemic cardiomyopathy) with EF 25-30%   Moderate mitral regurgitation   Dyslipidemia   History of asthma   Minor CAD at cath 07/18/13   Chronic systolic heart failure  Stop cardiazem Continue amio Anticipate discharge in am Will ask JA to see in am so as facilitate process for PVI/RFCA i would hope that we can avoid pacing  No on betablocker for cardiomyopathy 2/2 bradycardia Signed, Sherryl MangesSteven Graeden Bitner MD  09/01/2013

## 2013-09-01 NOTE — Telephone Encounter (Signed)
Admitted 7/13 for Tikosyn loading, failed. Currently still admitted. Requests letter stating why she cannot have jury duty. Forwarding request to Dr. Tenny Craw.

## 2013-09-01 NOTE — Progress Notes (Signed)
Pt reported mid sternal chest pain 5/10 while sitting in chair since 1800.  Pt BP 114/62, O2 at 2L applied per Elliott.  EKG obtained with no acute changes noted.  Maalox given PO, pt declined NTG.  MD notified with orders for 1 troponin.  Will continue to monitor.

## 2013-09-01 NOTE — Telephone Encounter (Signed)
New message    husband calling patient in hospital.   Patient has jury duty in 2 weeks . Need a detail letter explaining why she unable to attend.

## 2013-09-01 NOTE — Progress Notes (Addendum)
Dr. Graciela Husbands had discontinued Cardizem Drip, he stated patient did not need anything added for rate control.  IV cardizem drip stopped per order.

## 2013-09-01 NOTE — Progress Notes (Signed)
rn notified by CMT that patient's heart rate = 170's, patient was ambulating to nurses station at this time.  Heart rate then noted to be from 1 teens to 140's.  Dr. Graciela Husbands notified.  He stated to start 25 mg Metoprolol BID, order entered by [facility] Misty Stanley.

## 2013-09-02 ENCOUNTER — Telehealth: Payer: Self-pay | Admitting: Internal Medicine

## 2013-09-02 ENCOUNTER — Other Ambulatory Visit: Payer: Self-pay | Admitting: Physician Assistant

## 2013-09-02 ENCOUNTER — Encounter: Payer: Self-pay | Admitting: Internal Medicine

## 2013-09-02 ENCOUNTER — Encounter (HOSPITAL_COMMUNITY): Payer: Self-pay | Admitting: Internal Medicine

## 2013-09-02 DIAGNOSIS — I4891 Unspecified atrial fibrillation: Secondary | ICD-10-CM

## 2013-09-02 MED ORDER — AMIODARONE HCL 400 MG PO TABS: 400.0000 mg | ORAL_TABLET | Freq: Two times a day (BID) | ORAL | Status: DC

## 2013-09-02 MED ORDER — METOPROLOL TARTRATE 25 MG PO TABS: 25.0000 mg | ORAL_TABLET | Freq: Two times a day (BID) | ORAL | Status: DC

## 2013-09-02 NOTE — Telephone Encounter (Signed)
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 She is scheduled for [employer] on August 3rd. Patient had spoken to Dr. Graciela Husbands regarding obtaining a letter to have her Kiara [name] Duty postponed for 3-6 months, however she has not seen or received anything. The courts have to have something in writing from a physician at least 10 days in advance (and only accepted on certain days of the week), so patient needs to receive it ASAP. Routed to Dr. Tenny Craw

## 2013-09-02 NOTE — Plan of Care (Signed)
Problem: Discharge Progression Outcomes Goal: Sinus rate/atrial ECG rhythm with HR < 100/min Outcome: Adequate for Discharge Pt still in atrial fib

## 2013-09-02 NOTE — Progress Notes (Addendum)
Patient ID: Kiara [name], female   DOB: 1960/09/18, 53 y.o.   MRN: [id number]   Patient Name: Kiara [name] Date of Encounter: 09/02/2013     Principal Problem:   Atrial fibrillation Active Problems:   Moderate mitral regurgitation   Dyslipidemia   History of asthma   Minor CAD at cath 07/18/13   Chronic systolic heart failure   NICM (nonischemic cardiomyopathy) with EF 25-30%   Sinus bradycardia    SUBJECTIVE  Dyspnea improved. Still some palpitations.  CURRENT MEDS . amiodarone  400 mg Oral BID  . apixaban  5 mg Oral BID  . atorvastatin  40 mg Oral q1800  . furosemide  20 mg Oral Daily  . lisinopril  5 mg Oral Daily  . magnesium oxide  200 mg Oral Daily  . metoprolol tartrate  25 mg Oral BID  . potassium chloride  10 mEq Oral Daily  . sertraline  200 mg Oral Q0600  . sodium chloride  3 mL Intravenous Q12H  . sodium chloride  3 mL Intravenous Q12H    OBJECTIVE  Filed Vitals:   09/01/13 2000 09/01/13 2041 09/01/13 2351 09/02/13 0553  BP: 93/66 114/62 113/55 111/68  Pulse: 88 105 71 103  Temp: 98.5 F (36.9 C)  98.2 F (36.8 C) 97.5 F (36.4 C)  TempSrc:    Oral  Resp: 18  16 16   Height:      Weight:    191 lb (86.637 kg)  SpO2: 99% 100% 99% 96%    Intake/Output Summary (Last 24 hours) at 09/02/13 1028 Last data filed at 09/01/13 2202  Gross per 24 hour  Intake      3 ml  Output      0 ml  Net      3 ml   Filed Weights   08/31/13 0400 09/01/13 0400 09/02/13 0553  Weight: 191 lb 6.4 oz (86.818 kg) 190 lb 14.4 oz (86.592 kg) 191 lb (86.637 kg)    PHYSICAL EXAM  General: Pleasant, middle aged woman, NAD. Neuro: Alert and oriented X 3. Moves all extremities spontaneously. Psych: Normal affect. HEENT:  Normal  Neck: Supple without bruits or JVD. Lungs:  Resp regular and unlabored, CTA. Heart: RRR no s3, s4, or murmurs. Abdomen: Soft, non-tender, non-distended, BS + x 4.  Extremities: No clubbing, cyanosis or edema. DP/PT/Radials 2+ and equal  bilaterally.  Accessory Clinical Findings  CBC No results found for this basename: WBC, NEUTROABS, HGB, HCT, MCV, PLT,  in the last 72 hours Basic Metabolic Panel No results found for this basename: NA, K, CL, CO2, GLUCOSE, BUN, CREATININE, CALCIUM, MG, PHOS,  in the last 72 hours Liver Function Tests No results found for this basename: AST, ALT, ALKPHOS, BILITOT, PROT, ALBUMIN,  in the last 72 hours No results found for this basename: LIPASE, AMYLASE,  in the last 72 hours Cardiac Enzymes  Recent Labs  09/01/13 2235  TROPONINI <0.30   BNP No components found with this basename: POCBNP,  D-Dimer No results found for this basename: DDIMER,  in the last 72 hours Hemoglobin A1C No results found for this basename: HGBA1C,  in the last 72 hours Fasting Lipid Panel No results found for this basename: CHOL, HDL, LDLCALC, TRIG, CHOLHDL, LDLDIRECT,  in the last 72 hours Thyroid Function Tests No results found for this basename: TSH, T4TOTAL, FREET3, T3FREE, THYROIDAB,  in the last 72 hours  TELE  Atrial fib with a CVR/RVR  Radiology/Studies  No results found.  ASSESSMENT  AND PLAN 1. Difficult to control atrial fibrillation 2. Tachy induced cardiomyopathy 3. Sinus node dysfunction  4. HTN Rec: Her rate is well enough controlled to discharge home. She will not be on a beta blocker due to sinus node dysfunction. She should go home on amiodarone 400 mg twice daily. She will need followup with Dr. Johney FrameAllred in the next 2-3 weeks. I would like for her to return to our office in one week for an ECG and a BMP. If her ventricular rates are still elevated at that time would start her on a beta blocker. Over the last 24 hours, her HR's have been around 100/min despite some excursions up to 150/min. She will stay on the current other meds.  Kiara [name],M.D.  09/02/2013 10:28 AM

## 2013-09-02 NOTE — Telephone Encounter (Signed)
I wrote email  I did not have specific dates but can write again.

## 2013-09-02 NOTE — Discharge Summary (Signed)
Discharge Summary   Patient ID: Kiara [name],  MRN: [id number], DOB/AGE: 55-07-1960 55 y.o.  Admit date: 08/26/2013 Discharge date: 09/02/2013  Primary Care Provider: Londell Moh Primary Cardiologist: Dr. Ross/Dr. Allred  Discharge Diagnoses Principal Problem:   Atrial fibrillation Active Problems:   Moderate mitral regurgitation   Dyslipidemia   History of asthma   Minor CAD at cath 07/18/13   Chronic systolic heart failure   NICM (nonischemic cardiomyopathy) with EF 25-30%   Sinus bradycardia   Allergies Allergies  Allergen Reactions  . Azithromycin Diarrhea and Nausea And Vomiting    Procedures  Cardioversion 08/29/2013  With pads in AP position, patient cardioverted to Sinus bradycardia with 200 J synchronized biphasic energy.  Procedure without complication.     Hospital Course  The patient is a 53 year old female who presented in June this year with persistent atrial fibrillation with RVR and CHF symptom. It was her first episode of A. fib. Echocardiogram at the time shows her EF to be 30%. Cardiac catheterization in June showed minimal coronary disease. She underwent TEE on June 5 which showed small LA thrombus and she was placed on Eliquis. Repeat TEE on July 6 showed resolution of LA thrombus and she underwent cardioversion and was discharged from the hospital. She initially felt well, however began to notice tachycardia and malaise on the weekend after her discharge. She was seen by Dr. Tenny Craw in the clinic on 08/25/2013 and was directly admitted to the hospital to start Tikosyn. Overnight, she converted back to sinus rhythm with frequent bradycardia episode. Her beta blocker was initially decreased and was eventually discontinued secondary to bradycardia with consideration of starting low-dose carvedilol at later time once bradycardiac episodes resolved. Patient went back to atrial fibrillation shortly after midnight on 08/29/2013. It was decided to attempt  cardioversion again. She underwent cardioversion with successful conversion to sinus bradycardia with single 200 J synchronized biphasic energy on 08/29/2013. Unfortunately, patient went back to atrial fibrillation by the morning of following day. It was felt that her atrial fibrillation with RVR was refractory to [facility] therapy. Therefore, Tikosyn was stopped. After discussing with possible treatment options with the patient, patient was started on IV diltiazem for rate control and low-dose amiodarone after allowing 24 hours of tikosyn washout. Patient was consider for catheter ablation procedure however the result is better if she can maintain normal sinus rhythm for several month. With her history of bradycardia, she may ultimately require pacemaker for backup rate control. However with her current symptom, it was decided to aim for rate control for now. Once heart rate is controlled, we will plan for DC cardioversion in 2-3 weeks with short-term amiodarone and a referral for atrial fibrillation ablation procedure.   Patient was seen the morning of 09/02/2013 at which time she continued to be in atrial fibrillation with difficult to control heart rate. Her heart rate has been around 100 with occasional jump into 150s. She is deemed stable for discharge from EP perspective. Her metoprolol was restarted on 7/20. She will be discharged with 400 mg twice a day of amiodarone. I have scheduled close followup with Dr. Johney Frame in 2-3 weeks. Patient will obtain lab work and repeat EKG in 1 week after discharge.    Discharge Vitals Blood pressure 114/72, pulse 104, temperature 97.5 F (36.4 C), temperature source Oral, resp. rate 16, height 5\' 7"  (1.702 m), weight 191 lb (86.637 kg), SpO2 96.00%.  Filed Weights   08/31/13 0400 09/01/13 0400 09/02/13 0553  Weight: 191 lb  6.4 oz (86.818 kg) 190 lb 14.4 oz (86.592 kg) 191 lb (86.637 kg)    Labs  Cardiac Enzymes  Recent Labs  09/01/13 2235  TROPONINI <0.30     Disposition  Pt is being discharged home today in good condition.  Follow-up Plans & Appointments      Follow-up Information   Follow up with Hillis RangeJames Allred, MD On 09/24/2013. (2:30pm)    Specialty:  Cardiology   Contact information:   [address] [place] [address] [phone]       [facility] [address] [facility] On 09/09/2013. (obtain BMP and EKG at [address] office, no appointment needed, can go anytime when office open. If EKG shows heart rate higher than 100-110, please call Dr. Jenel LucksAllred's office, potentially add metoprolol or carvedilol )    Specialty:  Cardiology   Contact information:   [address], [address] [place] [address] [phone]      Discharge Medications    Medication List         acetaminophen 325 MG tablet  Commonly known as:  TYLENOL  Take 650 mg by mouth every 6 (six) hours as needed for mild pain.     amiodarone 400 MG tablet  Commonly known as:  PACERONE  Take 1 tablet (400 mg total) by mouth 2 (two) times daily.     apixaban 5 MG Tabs tablet  Commonly known as:  ELIQUIS  Take 5 mg by mouth 2 (two) times daily.     atorvastatin 40 MG tablet  Commonly known as:  LIPITOR  Take 40 mg by mouth daily at 6 PM.     furosemide 20 MG tablet  Commonly known as:  LASIX  Take 20 mg by mouth daily.     lisinopril 5 MG tablet  Commonly known as:  PRINIVIL,ZESTRIL  Take 5 mg by mouth daily.     metoprolol tartrate 25 MG tablet  Commonly known as:  LOPRESSOR  Take 1 tablet (25 mg total) by mouth 2 (two) times daily.     potassium chloride 10 MEQ tablet  Commonly known as:  K-DUR  Take 10 mEq by mouth daily.     sertraline 100 MG tablet  Commonly known as:  ZOLOFT  Take 200 mg by mouth every morning.        Outstanding Labs/Studies  EKG and BMP in 1 week, then follow up with Dr. Jenel LucksAllred's office  Duration of Discharge Encounter   Greater than 30 minutes including physician  time.  Ramond DialSigned, Derron Pipkins PA-C 09/02/2013, 12:18 PM

## 2013-09-02 NOTE — Telephone Encounter (Signed)
Patient was in hospital

## 2013-09-02 NOTE — Telephone Encounter (Signed)
New message     Pt is in the hospital--may be discharged today.  She is scheduled for jury duty but need a note to get a postponement.  Please call her

## 2013-09-03 NOTE — Telephone Encounter (Signed)
I spoke with Ms. Kiara [name].  Will put letter written by Dr. Tenny Craw at front desk today.  Her husband will come by to pick it up.

## 2013-09-04 ENCOUNTER — Ambulatory Visit: Payer: BC Managed Care – PPO | Admitting: Physician Assistant

## 2013-09-04 NOTE — Telephone Encounter (Signed)
I dictated lettter. Please make sure it was sent.

## 2013-09-08 ENCOUNTER — Telehealth: Payer: Self-pay | Admitting: Internal Medicine

## 2013-09-08 DIAGNOSIS — I4891 Unspecified atrial fibrillation: Secondary | ICD-10-CM

## 2013-09-08 NOTE — Telephone Encounter (Signed)
Spoke with patient.   States she is coming tomorrow to office for labs and an EKG Was told when she was dc'd from hospital to do this. Orders are in EPIC.  States that since being home from hospital her HR was 40s-60s. Today in the 90s to low 100s, feels irregular. Took her meds already today. Denies SOB, chest feels heavy. BP 115/70 States Dr. Johney Frame discussed possible cardioversion in next week or so if she is still in afib.  Instructed patient to minimize activity today. Will be coming tomorrow around 12:30 for EKG.

## 2013-09-08 NOTE — Telephone Encounter (Signed)
New message     Pt has AFIB. Her heart rate is 91.  It has been as high as 106 today.  She is feeling a lot of pressure in chest. Please advise

## 2013-09-09 ENCOUNTER — Other Ambulatory Visit (INDEPENDENT_AMBULATORY_CARE_PROVIDER_SITE_OTHER): Payer: BC Managed Care – PPO

## 2013-09-09 ENCOUNTER — Ambulatory Visit (INDEPENDENT_AMBULATORY_CARE_PROVIDER_SITE_OTHER): Payer: BC Managed Care – PPO | Admitting: Nurse Practitioner

## 2013-09-09 VITALS — BP 101/64 | HR 42 | Resp 16

## 2013-09-09 DIAGNOSIS — I480 Paroxysmal atrial fibrillation: Secondary | ICD-10-CM

## 2013-09-09 DIAGNOSIS — I4891 Unspecified atrial fibrillation: Secondary | ICD-10-CM

## 2013-09-09 LAB — BASIC METABOLIC PANEL
BUN: 14 mg/dL (ref 6–23)
CO2: 27 mEq/L (ref 19–32)
Calcium: 9 mg/dL (ref 8.4–10.5)
Chloride: 105 mEq/L (ref 96–112)
Creatinine, Ser: 0.9 mg/dL (ref 0.4–1.2)
GFR: 71.8 mL/min (ref >60.00)
Glucose, Bld: 92 mg/dL (ref 70–99)
Potassium: 4 mEq/L (ref 3.5–5.1)
Sodium: 139 mEq/L (ref 135–145)

## 2013-09-09 MED ORDER — METOPROLOL TARTRATE 25 MG PO TABS: 25.0000 mg | ORAL_TABLET | ORAL | Status: DC | PRN

## 2013-09-09 NOTE — Progress Notes (Signed)
1.) Reason for visit: EKG for treatment for a fib with Amiodarone 400 mg BID  2.) Name of MD requesting visit: Azalee Course, PA  3.) H&P: Hx Atrial fib; recent d/c from [facility] with failed Tikosyn trial  4.) ROS related to problem: patient reports 1 episode of heart rate > 100 yesterday - states heart rate returned to "normal" of 40-50 bpm 12 hours after it began  5.) Assessment and plan per MD: EKG and review of symptoms with Dr. Excell Seltzer, DOD.   Per Dr. Excell Seltzer, patient is to stop Metoprolol 25 mg BID due to bradycardia and fatigue.  Patient is to take Metoprolol 25 mg prn for heart rate sustained at >100 bpm  I advised patient and husband to call back with questions and to keep f/u appointment with Dr. Johney Frame on 8/12

## 2013-09-09 NOTE — Patient Instructions (Signed)
Dr. Excell Seltzer has recommended you make the following change in your medication:  TAKE Metoprolol 25 mg only as needed for heart rate > 100 for 1 hour or greater  Keep your follow-up appointment with Dr. Excell Seltzer

## 2013-09-15 ENCOUNTER — Telehealth: Payer: Self-pay | Admitting: Internal Medicine

## 2013-09-15 NOTE — Telephone Encounter (Signed)
Patient states she is just calling in to let Dr. Tenny Craw know how she is doing on the prn metoprolol 25 mg. Patient states this past week she was in AFib with HR in 120's. She took Metoprolol 25 mg by mouth in AM on Thursday, Friday and Saturday. On Sunday she was in out of AFib with regular heartrate at 42 bpm, so she only took 1/2 tablet of the Metoprolol 25 mg. Today her HR is regular at 60 bpm. Denies fatigue. Denies SOB. Routed to Dr. Tenny Craw (as Lorain Childes)

## 2013-09-15 NOTE — Telephone Encounter (Signed)
New message     Calling to give update on her metoprolol because her heart rate got to low and she had to play with her medication.  She was taking 2  25mg  tablets then started taking 1 mg tablet in the am.  As of yesterday, she is now taking 1/2 of a 25mg  tab in the am.  Her heart rate is staying in the 50s and she feels fine.

## 2013-09-24 ENCOUNTER — Ambulatory Visit (INDEPENDENT_AMBULATORY_CARE_PROVIDER_SITE_OTHER): Payer: BC Managed Care – PPO | Admitting: Internal Medicine

## 2013-09-24 ENCOUNTER — Encounter: Payer: Self-pay | Admitting: Internal Medicine

## 2013-09-24 VITALS — BP 112/69 | HR 45 | Ht 67.0 in | Wt 196.0 lb

## 2013-09-24 DIAGNOSIS — I5022 Chronic systolic (congestive) heart failure: Secondary | ICD-10-CM

## 2013-09-24 DIAGNOSIS — I340 Nonrheumatic mitral (valve) insufficiency: Secondary | ICD-10-CM

## 2013-09-24 DIAGNOSIS — I4819 Other persistent atrial fibrillation: Secondary | ICD-10-CM

## 2013-09-24 DIAGNOSIS — I428 Other cardiomyopathies: Secondary | ICD-10-CM

## 2013-09-24 DIAGNOSIS — I059 Rheumatic mitral valve disease, unspecified: Secondary | ICD-10-CM

## 2013-09-24 DIAGNOSIS — I4891 Unspecified atrial fibrillation: Secondary | ICD-10-CM

## 2013-09-24 DIAGNOSIS — I480 Paroxysmal atrial fibrillation: Secondary | ICD-10-CM

## 2013-09-24 MED ORDER — AMIODARONE HCL 400 MG PO TABS: ORAL_TABLET | ORAL | Status: DC

## 2013-09-24 NOTE — Progress Notes (Signed)
Primary Care Physician: Londell Moh, MD Primary Cardiologist: Dietrich Pates Primary Electrophysiologist:Dr. Hillis Range Referring Physician: Dr. Sharrell Ku   Kiara [name] is a 53 y.o. female with a h/o  paroxysmal/  atrial fibrillation who presents for consultation in the [facility].  The patient was initially diagnosed with atrial fibrillation  after presenting with symptoms of dyspnea/ CHF with RVR and echo revealing EF of 20-30%, probable tachycardia mediated cardiomyopathy, in June. Cardiac catherization revealed minimal CAD. She was loaded with tikosyn and failed to convert with DCCV.  Amiodarone was started along with metoprolol and some bradycardia has been present, but is staying in SR and tolerating the bradycardia.. She is here to pursue catheter ablation to avoid possible long term side effects of amiodarone and better quality of life.    Today, she denies symptoms of palpitations, chest pain, shortness of breath, orthopnea, PND, lower extremity edema, dizziness, presyncope, syncope, snoring, daytime somnolence, bleeding, or neurologic sequela. The patient is tolerating medications without difficulties and is otherwise without complaint today.    Atrial Fibrillation Risk Factors:  she does not have symptoms or diagnosis of sleep apnea.   she does not have a history of rheumatic fever.  she does not have a history of alcohol use.  she has a BMI of Body mass index is 30.69 kg/(m^2).Marland Kitchen Filed Weights   09/24/13 1446  Weight: 88.905 kg (196 lb)    LA size: 44mm   Atrial Fibrillation Management history:  Previous antiarrhythmic drugs: Tikosyn  TEE - 6/5 small Left atrial thrombus/ Noac started  Previous cardioversions: 7/6 and 7/17  Previous ablations: None  CHADS2VASC score: 2 (Female and HF)  Anticoagulation history:  Eliquis 5mg  bid   Past Medical History  Diagnosis Date  . Asthma   . Hyperlipemia   . Obesity   .  Osteopenia   . Hypothyroidism     past hx  . Depression   . Atrial fibrillation     a. admx with AFib with RVR and acute systolic CHF 07/2013; TEE with LAA clot and no DCCV done;    . NICM (nonischemic cardiomyopathy)     a. Echo (07/17/13):  EF 25-30%, diff HK, basal and mid anteroseptum/ basal inferoseptum and apical AK, trivial AI, mod MR, mod LAE, mild reduced RVSF, mild RAE  . Chronic systolic heart failure   . Coronary artery disease     a.  LHC (07/17/13):  LM 20%, ostial CFX 20-30%  . Nephrolithiasis 2013    "passed them"   Past Surgical History  Procedure Laterality Date  . Appendectomy  1978  . Abdominal hysterectomy  ~ 2001  . Tee without cardioversion N/A 07/18/2013    Procedure: TRANSESOPHAGEAL ECHOCARDIOGRAM (TEE);  Surgeon: Lewayne Bunting, MD;  Location: [facility] ENDOSCOPY;  Service: Cardiovascular;  Laterality: N/A;  . Tee without cardioversion N/A 08/18/2013    Procedure: TRANSESOPHAGEAL ECHOCARDIOGRAM (TEE);  Surgeon: Donato Schultz, MD;  Location: [facility] ENDOSCOPY;  Service: Cardiovascular;  Laterality: N/A;  . Cardioversion N/A 08/18/2013    Procedure: CARDIOVERSION;  Surgeon: Donato Schultz, MD;  Location: [facility] ENDOSCOPY;  Service: Cardiovascular;  Laterality: N/A;  . Cardiac catheterization  07/2013  . Cardioversion N/A 08/29/2013    Procedure: CARDIOVERSION;  Surgeon: Pricilla Riffle, MD;  Location: [facility] OR;  Service: Cardiovascular;  Laterality: N/A;    Current Outpatient Prescriptions  Medication Sig Dispense Refill  . acetaminophen (TYLENOL) 325 MG tablet Take 650 mg by mouth every 6 (six) hours as  needed for mild pain.      Marland Kitchen. amiodarone (PACERONE) 400 MG tablet Take 400 mg daily for 10 days.  Then take 200 mg daily thereafter.  60 tablet  3  . apixaban (ELIQUIS) 5 MG TABS tablet Take 5 mg by mouth 2 (two) times daily.      Marland Kitchen. atorvastatin (LIPITOR) 40 MG tablet Take 40 mg by mouth daily at 6 PM.      . furosemide (LASIX) 20 MG tablet Take 20 mg by mouth daily.      Marland Kitchen. lisinopril  (PRINIVIL,ZESTRIL) 5 MG tablet Take 5 mg by mouth daily.      . metoprolol tartrate (LOPRESSOR) 25 MG tablet Take 25 mg by mouth daily.      . potassium chloride (K-DUR) 10 MEQ tablet Take 10 mEq by mouth daily.      . sertraline (ZOLOFT) 100 MG tablet Take 200 mg by mouth every morning.        No current facility-administered medications for this visit.    Allergies  Allergen Reactions  . Azithromycin Diarrhea and Nausea And Vomiting    History   Social History  . Marital Status: Married    Spouse Name: N/A    Number of Children: N/A  . Years of Education: N/A   Occupational History  . Not on file.   Social History Main Topics  . Smoking status: Never Smoker   . Smokeless tobacco: Never Used  . Alcohol Use: 0.6 oz/week    1 Cans of beer per week  . Drug Use: No  . Sexual Activity: Yes   Other Topics Concern  . Not on file   Social History Narrative  . No narrative on file    Family History  Problem Relation Age of Onset  . Hypertension Mother   . Hypertension Father   . Lymphoma Mother    The patient does not have a history of early familial atrial fibrillation or other arrhythmias.  ROS- All systems are reviewed and negative except as per the HPI above.  Physical Exam: Filed Vitals:   09/24/13 1446  BP: 112/69  Pulse: 45  Height: 5\' 7"  (1.702 m)  Weight: 88.905 kg (196 lb)    GEN- The patient is well appearing, alert and oriented x 3 today.   Head- normocephalic, atraumatic Eyes-  Sclera clear, conjunctiva pink Ears- hearing intact Oropharynx- clear Neck- supple, no JVP Lymph- no cervical lymphadenopathy Lungs- Clear to ausculation bilaterally, normal work of breathing Heart- Regular rate and rhythm, no murmurs, rubs or gallops, PMI not laterally displaced GI- soft, NT, ND, + BS Extremities- no clubbing, cyanosis, or edema MS- no significant deformity or atrophy Skin- no rash or lesion Psych- euthymic mood, full affect Neuro- strength and  sensation are intact  EKG Sinus brady at 45 bpm with QTc 423ms.  Echo EF 25-30% with severely depressed LVEF. Moderately dilated left atrium. Mod tricuspid regurgitation. Normal RVSP.  Epic records are reviewed at length today  Assessment and Plan:  1. Atrial fibrillation The patient has symptomatic paroxysmal atrial fibrillation.  The patients CHAD2VASC score is 2  she is  appropriately anticoagulated at this time. The patient is adequately rhythm controlled with amiodarone. Antiarrhythmic therapy to dates has included tikosyn/amiodarone.  The patients left atrial size is 44 mm.  Additional echo findings include LV dysfunction. A long discussion with the patient was had today regarding therapeutic strategies.  Extensive discussion of lifestyle modification was also discussed.  She has failed medical therapy  with tikosyn.  She continues to have afib despite amiodarone. Therapeutic strategies for afib including medicine and ablation were discussed in detail with the patient today. Risk, benefits, and alternatives to EP study and radiofrequency ablation for afib were also discussed in detail today. These risks include but are not limited to stroke, bleeding, vascular damage, tamponade, perforation, damage to the esophagus, lungs, and other structures, pulmonary vein stenosis, worsening renal function, and death. The patient understands these risk and wishes to proceed.  We will therefore proceed with catheter ablation at the next available time.   2. Body mass index is 30.69 kg/(m^2). As above, lifestyle modification was discussed at length including regular exercise and weight reduction.   3. Reduce amiodarone to 200 mg a day with metoprolol 25 mg a day.   Rudi Coco NP  Nurse Practitioner, [facility] 09/24/2013 5:55 PM   I have seen, examined the patient, and reviewed the above assessment and plan with Rudi Coco NP.  Changes to above are made where  necessary.  She has failed medical therapy with tikosyn and amiodarone.  She has persistent afib.  I had a long discussion with her today with risks, benefits, and alternatives to catheter ablation (see above).  She will be scheduled for ablation at the next available time.  Hopefully her EF will improve with sinus rhythm.  Co Sign: Hillis Range, MD 09/24/2013 9:52 PM

## 2013-09-24 NOTE — Patient Instructions (Addendum)
Your physician has recommended you make the following change in your medication:  AMIODARONE --take 400 mg daily for 10 days, then take 200 mg daily thereafter.  Your physician recommends that you have an afib ablation. We will call you with details/instructions for this procedure PLEASE DO NOT MISS ANY DOSES OF YOUR ELIQUIS LEADING UP TO THE ABLATION.

## 2013-10-01 ENCOUNTER — Telehealth: Payer: Self-pay | Admitting: Internal Medicine

## 2013-10-01 NOTE — Telephone Encounter (Signed)
**Note De-Identified  Obfuscation** The pt is advised that she is scheduled to have an ablation on September 15 and that Tresa Endo, Dr Amedeo Plenty nurse, will be getting in touch with her next week with further instructions.  She verbalized understanding and thanked me for my assistance.

## 2013-10-01 NOTE — Telephone Encounter (Signed)
New problem   Pt want to know status of ablation that is suppose to be scheduled. Please call pt.

## 2013-10-05 ENCOUNTER — Telehealth: Payer: Self-pay | Admitting: Physician Assistant

## 2013-10-05 NOTE — Telephone Encounter (Signed)
       I returned a page from the patient who has a hx of recalcitrant afib and followed by Dr. Johney Frame. She has failed medical therapy with tikosyn and amiodarone and is in persistent afib. She is still on amiodarone which was recently decreased from 400 to 200 mg a day as well as metoprolol 25 mg a day as needed for tachycardia. She used an extra metoprolol last week for palpitations/tachycardia with success. She feels like she is out of rhythm again today with HRs 100s-110s and would like to know if she should take another PRN BB. She has no CP or SOB but just feeling kind of poorly. I agreed that she should take her PRN BB and call the office in the AM to report her episodes to Dr. Johney Frame. She is scheduled for an ablation sometime in Sept.   Cline Crock PA-C  [facility]

## 2013-10-10 ENCOUNTER — Encounter: Payer: Self-pay | Admitting: *Deleted

## 2013-10-10 ENCOUNTER — Other Ambulatory Visit: Payer: Self-pay | Admitting: *Deleted

## 2013-10-10 DIAGNOSIS — Z01812 Encounter for preprocedural laboratory examination: Secondary | ICD-10-CM

## 2013-10-10 DIAGNOSIS — I480 Paroxysmal atrial fibrillation: Secondary | ICD-10-CM

## 2013-10-10 NOTE — Telephone Encounter (Signed)
Reviewed TEE/Ablation instructions and left letters at front desk for her to pick up at pre-procedure lab appt on 9/8 (letter states 9/7, but pt is aware to come on 9/8). Patient verbalized understanding of all instructions.

## 2013-10-15 ENCOUNTER — Encounter (HOSPITAL_COMMUNITY): Payer: Self-pay | Admitting: Pharmacy Technician

## 2013-10-21 ENCOUNTER — Other Ambulatory Visit (INDEPENDENT_AMBULATORY_CARE_PROVIDER_SITE_OTHER): Payer: BC Managed Care – PPO

## 2013-10-21 DIAGNOSIS — I480 Paroxysmal atrial fibrillation: Secondary | ICD-10-CM

## 2013-10-21 DIAGNOSIS — I4891 Unspecified atrial fibrillation: Secondary | ICD-10-CM

## 2013-10-21 DIAGNOSIS — Z01812 Encounter for preprocedural laboratory examination: Secondary | ICD-10-CM

## 2013-10-21 LAB — CBC WITH DIFFERENTIAL/PLATELET
Basophils Absolute: 0 10*3/uL (ref 0.0–0.1)
Basophils Relative: 0.3 % (ref 0.0–3.0)
Eosinophils Absolute: 0.3 10*3/uL (ref 0.0–0.7)
Eosinophils Relative: 2.8 % (ref 0.0–5.0)
HCT: 39.3 % (ref 36.0–46.0)
Hemoglobin: 13.1 g/dL (ref 12.0–15.0)
Lymphocytes Relative: 15.3 % (ref 12.0–46.0)
Lymphs Abs: 1.4 10*3/uL (ref 0.7–4.0)
MCHC: 33.4 g/dL (ref 30.0–36.0)
MCV: 89.9 fl (ref 78.0–100.0)
Monocytes Absolute: 0.7 10*3/uL (ref 0.1–1.0)
Monocytes Relative: 7.7 % (ref 3.0–12.0)
Neutro Abs: 6.8 10*3/uL (ref 1.4–7.7)
Neutrophils Relative %: 73.9 % (ref 43.0–77.0)
Platelets: 179 10*3/uL (ref 150.0–400.0)
RBC: 4.38 Mil/uL (ref 3.87–5.11)
RDW: 15.6 % — ABNORMAL HIGH (ref 11.5–15.5)
WBC: 9.2 10*3/uL (ref 4.0–10.5)

## 2013-10-21 LAB — BASIC METABOLIC PANEL
BUN: 11 mg/dL (ref 6–23)
CO2: 26 mEq/L (ref 19–32)
Calcium: 9.4 mg/dL (ref 8.4–10.5)
Chloride: 107 mEq/L (ref 96–112)
Creatinine, Ser: 0.8 mg/dL (ref 0.4–1.2)
GFR: 80.22 mL/min (ref >60.00)
Glucose, Bld: 67 mg/dL — ABNORMAL LOW (ref 70–99)
Potassium: 4 mEq/L (ref 3.5–5.1)
Sodium: 141 mEq/L (ref 135–145)

## 2013-10-26 MED ORDER — SODIUM CHLORIDE 0.9 % IV SOLN: INTRAVENOUS | Status: DC

## 2013-10-27 ENCOUNTER — Encounter (HOSPITAL_COMMUNITY): Payer: Self-pay | Admitting: *Deleted

## 2013-10-27 ENCOUNTER — Ambulatory Visit (HOSPITAL_COMMUNITY)
Admission: RE | Admit: 2013-10-27 | Discharge: 2013-10-27 | Disposition: A | Discharge status: Home / Self Care | Payer: BC Managed Care – PPO | Source: Ambulatory Visit | Attending: Internal Medicine | Admitting: Internal Medicine

## 2013-10-27 ENCOUNTER — Encounter (HOSPITAL_COMMUNITY)
Admission: RE | Disposition: A | Discharge status: Home / Self Care | Payer: Self-pay | Source: Ambulatory Visit | Attending: Internal Medicine

## 2013-10-27 DIAGNOSIS — I4891 Unspecified atrial fibrillation: Secondary | ICD-10-CM | POA: Diagnosis not present

## 2013-10-27 DIAGNOSIS — J45909 Unspecified asthma, uncomplicated: Secondary | ICD-10-CM | POA: Diagnosis not present

## 2013-10-27 DIAGNOSIS — E669 Obesity, unspecified: Secondary | ICD-10-CM | POA: Insufficient documentation

## 2013-10-27 DIAGNOSIS — I428 Other cardiomyopathies: Secondary | ICD-10-CM | POA: Insufficient documentation

## 2013-10-27 DIAGNOSIS — F329 Major depressive disorder, single episode, unspecified: Secondary | ICD-10-CM | POA: Insufficient documentation

## 2013-10-27 DIAGNOSIS — I5022 Chronic systolic (congestive) heart failure: Secondary | ICD-10-CM | POA: Insufficient documentation

## 2013-10-27 DIAGNOSIS — E785 Hyperlipidemia, unspecified: Secondary | ICD-10-CM | POA: Diagnosis not present

## 2013-10-27 DIAGNOSIS — I509 Heart failure, unspecified: Secondary | ICD-10-CM | POA: Insufficient documentation

## 2013-10-27 DIAGNOSIS — F3289 Other specified depressive episodes: Secondary | ICD-10-CM | POA: Insufficient documentation

## 2013-10-27 DIAGNOSIS — Z6829 Body mass index (BMI) 29.0-29.9, adult: Secondary | ICD-10-CM | POA: Insufficient documentation

## 2013-10-27 DIAGNOSIS — M899 Disorder of bone, unspecified: Secondary | ICD-10-CM | POA: Insufficient documentation

## 2013-10-27 DIAGNOSIS — M949 Disorder of cartilage, unspecified: Secondary | ICD-10-CM | POA: Diagnosis not present

## 2013-10-27 DIAGNOSIS — I2510 Atherosclerotic heart disease of native coronary artery without angina pectoris: Secondary | ICD-10-CM | POA: Insufficient documentation

## 2013-10-27 DIAGNOSIS — I480 Paroxysmal atrial fibrillation: Secondary | ICD-10-CM

## 2013-10-27 HISTORY — PX: TEE WITHOUT CARDIOVERSION: SHX5443

## 2013-10-27 SURGERY — ECHOCARDIOGRAM, TRANSESOPHAGEAL
Anesthesia: Moderate Sedation

## 2013-10-27 MED ORDER — SODIUM CHLORIDE 0.9 % IV SOLN
INTRAVENOUS | Status: DC
  Administered 2013-10-27: 08:00:00 via INTRAVENOUS

## 2013-10-27 MED ORDER — FENTANYL CITRATE 0.05 MG/ML IJ SOLN
INTRAMUSCULAR | Status: AC
  Filled 2013-10-27: qty 2

## 2013-10-27 MED ORDER — LIDOCAINE VISCOUS 2 % MT SOLN
OROMUCOSAL | Status: AC
  Filled 2013-10-27: qty 15

## 2013-10-27 MED ORDER — FENTANYL CITRATE 0.05 MG/ML IJ SOLN
INTRAMUSCULAR | Status: DC | PRN
  Administered 2013-10-27 (×2): 25 ug via INTRAVENOUS

## 2013-10-27 MED ORDER — MIDAZOLAM HCL 10 MG/2ML IJ SOLN
INTRAMUSCULAR | Status: DC | PRN
  Administered 2013-10-27: 2 mg via INTRAVENOUS
  Administered 2013-10-27: 1 mg via INTRAVENOUS

## 2013-10-27 MED ORDER — LIDOCAINE VISCOUS 2 % MT SOLN
OROMUCOSAL | Status: DC | PRN
  Administered 2013-10-27: 10 mL via OROMUCOSAL

## 2013-10-27 MED ORDER — MIDAZOLAM HCL 5 MG/ML IJ SOLN
INTRAMUSCULAR | Status: AC
  Filled 2013-10-27: qty 2

## 2013-10-27 NOTE — Progress Notes (Signed)
Echocardiogram Echocardiogram Transesophageal has been performed.  Kiara [name] 10/27/2013, 10:13 AM

## 2013-10-27 NOTE — Op Note (Signed)
No LA or LAA thrombus No PFO  Full report to follow

## 2013-10-27 NOTE — H&P (Signed)
HPI: Patient is a 53 yo with atrial fibrillation.  Plan for TEE prior to ablation procedure.         Past Medical History  Diagnosis Date  . Asthma   . Hyperlipemia   . Obesity   . Osteopenia   . Hypothyroidism     past hx  . Depression   . Atrial fibrillation     a. admx with AFib with RVR and acute systolic CHF 07/2013; TEE with LAA clot and no DCCV done;    . NICM (nonischemic cardiomyopathy)     a. Echo (07/17/13):  EF 25-30%, diff HK, basal and mid anteroseptum/ basal inferoseptum and apical AK, trivial AI, mod MR, mod LAE, mild reduced RVSF, mild RAE  . Chronic systolic heart failure   . Coronary artery disease     a.  LHC (07/17/13):  LM 20%, ostial CFX 20-30%  . Nephrolithiasis 2013    "passed them"    Medications Prior to Admission  Medication Sig Dispense Refill  . acetaminophen (TYLENOL) 325 MG tablet Take 650-975 mg by mouth every 6 (six) hours as needed for mild pain.       Marland Kitchen amiodarone (PACERONE) 200 MG tablet Take 200 mg by mouth daily.      Marland Kitchen apixaban (ELIQUIS) 5 MG TABS tablet Take 5 mg by mouth 2 (two) times daily.      Marland Kitchen atorvastatin (LIPITOR) 40 MG tablet Take 40 mg by mouth daily at 6 PM.      . furosemide (LASIX) 20 MG tablet Take 20 mg by mouth daily.      Marland Kitchen lisinopril (PRINIVIL,ZESTRIL) 5 MG tablet Take 5 mg by mouth daily.      . metoprolol tartrate (LOPRESSOR) 25 MG tablet Take 25 mg by mouth daily.      . potassium chloride (K-DUR) 10 MEQ tablet Take 10 mEq by mouth daily.      . sertraline (ZOLOFT) 100 MG tablet Take 200 mg by mouth every morning.             Infusions: . sodium chloride    . sodium chloride 20 mL/hr at 10/27/13 0817    Allergies  Allergen Reactions  . Azithromycin Diarrhea and Nausea And Vomiting    History   Social History  . Marital Status: Married    Spouse Name: N/A    Number of Children: N/A  . Years of Education: N/A   Occupational History  . Not on file.   Social History Main Topics  . Smoking  status: Never Smoker   . Smokeless tobacco: Never Used  . Alcohol Use: 0.6 oz/week    1 Cans of beer per week  . Drug Use: No  . Sexual Activity: Yes   Other Topics Concern  . Not on file   Social History Narrative  . No narrative on file    Family History  Problem Relation Age of Onset  . Hypertension Mother   . Hypertension Father   . Lymphoma Mother     REVIEW OF SYSTEMS:  All systems reviewed  Negative to the above problem except as noted above.    PHYSICAL EXAM: Filed Vitals:   10/27/13 0813  BP: 124/61  Pulse: 44  Temp: 97.9 F (36.6 C)  Resp: 15    No intake or output data in the 24 hours ending 10/27/13 0818  General:  Well appearing. No respiratory difficulty HEENT: normal Neck: supple. no JVD. Carotids 2+ bilat; no bruits. No lymphadenopathy  or thryomegaly appreciated. Cor: PMI nondisplaced. Regular rate & rhythm. No rubs, gallops or murmurs. Lungs: clear Abdomen: soft, nontender, nondistended. No hepatosplenomegaly. No bruits or masses. Good bowel sounds. Extremities: no cyanosis, clubbing, rash, edema Neuro: alert & oriented x 3, cranial nerves grossly intact. moves all 4 extremities w/o difficulty. Affect pleasant.  ECG:  No results found for this or any previous visit (from the past 24 hour(s)). No results found.   ASSESSMENT:  Plan for TEE today prior to ablation procedure  If neg for clot can proceed.

## 2013-10-27 NOTE — Discharge Instructions (Signed)
Transesophageal Echocardiogram °Transesophageal echocardiography (TEE) is a special type of test that produces images of the heart by using sound waves (echocardiogram). This type of echocardiography can obtain better images of the heart than standard echocardiography. TEE is done by passing a flexible tube down the esophagus. The heart is located in front of the esophagus. Because the heart and esophagus are close to one another, your health care provider can take very clear, detailed pictures of the heart via ultrasound waves. °TEE may be done: °· If your health care provider needs more information based on standard echocardiography findings. °· If you had a stroke. This might have happened because a clot formed in your heart. TEE can visualize different areas of the heart and check for clots. °· To check valve anatomy and function. °· To check for infection on the inside of your heart (endocarditis). °· To evaluate the dividing wall (septum) of the heart and presence of a hole that did not close after birth (patent foramen ovale or atrial septal defect). °· To help diagnose a tear in the wall of the aorta (aortic dissection). °· During cardiac valve surgery. This allows the surgeon to assess the valve repair before closing the chest. °· During a variety of other cardiac procedures to guide positioning of catheters. °· Sometimes before a cardioversion, which is a shock to convert heart rhythm back to normal. °LET YOUR HEALTH CARE PROVIDER KNOW ABOUT:  °· Any allergies you have. °· All medicines you are taking, including vitamins, herbs, eye drops, creams, and over-the-counter medicines. °· Previous problems you or members of your family have had with the use of anesthetics. °· Any blood disorders you have. °· Previous surgeries you have had. °· Medical conditions you have. °· Swallowing difficulties. °· An esophageal obstruction. °RISKS AND COMPLICATIONS  °Generally, TEE is a safe procedure. However, as with any  procedure, complications can occur. Possible complications include an esophageal tear (rupture). °BEFORE THE PROCEDURE  °· Do not eat or drink for 6 hours before the procedure or as directed by your health care provider. °· Arrange for someone to drive you home after the procedure. Do not drive yourself home. During the procedure, you will be given medicines that can continue to make you feel drowsy and can impair your reflexes. °· An IV access tube will be started in the arm. °PROCEDURE  °· A medicine to help you relax (sedative) will be given through the IV access tube. °· A medicine may be sprayed or gargled to numb the back of the throat. °· Your blood pressure, heart rate, and breathing (vital signs) will be monitored during the procedure. °· The TEE probe is a long, flexible tube. The tip of the probe is placed into the back of the mouth, and you will be asked to swallow. This helps to pass the tip of the probe into the esophagus. Once the tip of the probe is in the correct area, your health care provider can take pictures of the heart. °· TEE is usually not a painful procedure. You may feel the probe press against the back of the throat. The probe does not enter the trachea and does not affect your breathing. °AFTER THE PROCEDURE  °· You will be in bed, resting, until you have fully returned to consciousness. °· When you first awaken, your throat may feel slightly sore and will probably still feel numb. This will improve slowly over time. °· You will not be allowed to eat or drink until it   is clear that the numbness has improved. °· Once you have been able to drink, urinate, and sit on the edge of the bed without feeling sick to your stomach (nausea) or dizzy, you may be cleared to go home. °· You should have a friend or family member with you for the next 24 hours after your procedure. °Document Released: 04/22/2002 Document Revised: 02/04/2013 Document Reviewed: 08/01/2012 °[employer]® Patient Information  ©2015 [employer]. This information is not intended to replace advice given to you by your health care provider. Make sure you discuss any questions you have with your health care provider. ° °

## 2013-10-28 ENCOUNTER — Encounter (HOSPITAL_COMMUNITY): Payer: BC Managed Care – PPO | Admitting: Certified Registered Nurse Anesthetist

## 2013-10-28 ENCOUNTER — Ambulatory Visit (HOSPITAL_COMMUNITY): Payer: BC Managed Care – PPO | Admitting: Certified Registered Nurse Anesthetist

## 2013-10-28 ENCOUNTER — Encounter (HOSPITAL_COMMUNITY): Payer: Self-pay | Admitting: Certified Registered Nurse Anesthetist

## 2013-10-28 ENCOUNTER — Encounter (HOSPITAL_COMMUNITY)
Admission: RE | Disposition: A | Discharge status: Home / Self Care | Payer: Self-pay | Source: Ambulatory Visit | Attending: Internal Medicine

## 2013-10-28 ENCOUNTER — Ambulatory Visit (HOSPITAL_COMMUNITY)
Admission: RE | Admit: 2013-10-28 | Discharge: 2013-10-29 | Disposition: A | Discharge status: Home / Self Care | Payer: BC Managed Care – PPO | Source: Ambulatory Visit | Attending: Internal Medicine | Admitting: Internal Medicine

## 2013-10-28 DIAGNOSIS — E785 Hyperlipidemia, unspecified: Secondary | ICD-10-CM | POA: Diagnosis not present

## 2013-10-28 DIAGNOSIS — I43 Cardiomyopathy in diseases classified elsewhere: Secondary | ICD-10-CM | POA: Diagnosis present

## 2013-10-28 DIAGNOSIS — R001 Bradycardia, unspecified: Secondary | ICD-10-CM | POA: Diagnosis present

## 2013-10-28 DIAGNOSIS — I5022 Chronic systolic (congestive) heart failure: Secondary | ICD-10-CM | POA: Insufficient documentation

## 2013-10-28 DIAGNOSIS — Z6830 Body mass index (BMI) 30.0-30.9, adult: Secondary | ICD-10-CM | POA: Insufficient documentation

## 2013-10-28 DIAGNOSIS — Z79899 Other long term (current) drug therapy: Secondary | ICD-10-CM | POA: Diagnosis not present

## 2013-10-28 DIAGNOSIS — I428 Other cardiomyopathies: Secondary | ICD-10-CM | POA: Insufficient documentation

## 2013-10-28 DIAGNOSIS — J45909 Unspecified asthma, uncomplicated: Secondary | ICD-10-CM | POA: Diagnosis not present

## 2013-10-28 DIAGNOSIS — I4891 Unspecified atrial fibrillation: Secondary | ICD-10-CM | POA: Diagnosis present

## 2013-10-28 DIAGNOSIS — Z7901 Long term (current) use of anticoagulants: Secondary | ICD-10-CM | POA: Diagnosis not present

## 2013-10-28 DIAGNOSIS — E669 Obesity, unspecified: Secondary | ICD-10-CM | POA: Diagnosis not present

## 2013-10-28 DIAGNOSIS — I2510 Atherosclerotic heart disease of native coronary artery without angina pectoris: Secondary | ICD-10-CM | POA: Insufficient documentation

## 2013-10-28 DIAGNOSIS — I480 Paroxysmal atrial fibrillation: Secondary | ICD-10-CM

## 2013-10-28 HISTORY — PX: ABLATION: SHX5711

## 2013-10-28 HISTORY — PX: ATRIAL FIBRILLATION ABLATION: SHX5456

## 2013-10-28 LAB — POCT ACTIVATED CLOTTING TIME
Activated Clotting Time: 309 seconds
Activated Clotting Time: 259 seconds
Activated Clotting Time: 135 seconds

## 2013-10-28 LAB — MRSA PCR SCREENING: MRSA by PCR: NEGATIVE

## 2013-10-28 SURGERY — ATRIAL FIBRILLATION ABLATION
Anesthesia: General

## 2013-10-28 MED ORDER — FENTANYL CITRATE 0.05 MG/ML IJ SOLN
INTRAMUSCULAR | Status: DC | PRN
  Administered 2013-10-28: 25 ug via INTRAVENOUS
  Administered 2013-10-28: 50 ug via INTRAVENOUS
  Administered 2013-10-28 (×2): 25 ug via INTRAVENOUS

## 2013-10-28 MED ORDER — ISOPROTERENOL HCL 0.2 MG/ML IJ SOLN
Freq: Once | INTRAVENOUS | Status: DC
  Filled 2013-10-28: qty 125

## 2013-10-28 MED ORDER — APIXABAN 5 MG PO TABS
5.0000 mg | ORAL_TABLET | Freq: Two times a day (BID) | ORAL | Status: DC
  Administered 2013-10-28 – 2013-10-29 (×2): 5 mg via ORAL
  Filled 2013-10-28 (×3): qty 1

## 2013-10-28 MED ORDER — HEPARIN SODIUM (PORCINE) 1000 UNIT/ML IJ SOLN
INTRAMUSCULAR | Status: AC
  Filled 2013-10-28: qty 1

## 2013-10-28 MED ORDER — FENTANYL CITRATE 0.05 MG/ML IJ SOLN: 25.0000 ug | INTRAMUSCULAR | Status: DC | PRN

## 2013-10-28 MED ORDER — HYDROCODONE-ACETAMINOPHEN 5-325 MG PO TABS
ORAL_TABLET | ORAL | Status: AC
  Filled 2013-10-28: qty 1

## 2013-10-28 MED ORDER — ONDANSETRON HCL 4 MG/2ML IJ SOLN
INTRAMUSCULAR | Status: DC | PRN
  Administered 2013-10-28: 4 mg via INTRAVENOUS

## 2013-10-28 MED ORDER — SODIUM CHLORIDE 0.9 % IJ SOLN: 3.0000 mL | INTRAMUSCULAR | Status: DC | PRN

## 2013-10-28 MED ORDER — MIDAZOLAM HCL 5 MG/5ML IJ SOLN
INTRAMUSCULAR | Status: DC | PRN
  Administered 2013-10-28: 1 mg via INTRAVENOUS

## 2013-10-28 MED ORDER — ALBUMIN HUMAN 5 % IV SOLN
INTRAVENOUS | Status: DC | PRN
  Administered 2013-10-28: 08:00:00 via INTRAVENOUS

## 2013-10-28 MED ORDER — LIDOCAINE HCL (CARDIAC) 20 MG/ML IV SOLN
INTRAVENOUS | Status: DC | PRN
  Administered 2013-10-28: 50 mg via INTRAVENOUS

## 2013-10-28 MED ORDER — HYDROCODONE-ACETAMINOPHEN 5-325 MG PO TABS
1.0000 | ORAL_TABLET | ORAL | Status: DC | PRN
  Administered 2013-10-28: 1 via ORAL

## 2013-10-28 MED ORDER — HEPARIN SODIUM (PORCINE) 1000 UNIT/ML IJ SOLN
INTRAMUSCULAR | Status: DC | PRN
  Administered 2013-10-28: 12000 [IU] via INTRAVENOUS

## 2013-10-28 MED ORDER — EPHEDRINE SULFATE 50 MG/ML IJ SOLN
INTRAMUSCULAR | Status: DC | PRN
  Administered 2013-10-28: 10 mg via INTRAVENOUS
  Administered 2013-10-28 (×3): 5 mg via INTRAVENOUS
  Administered 2013-10-28: 10 mg via INTRAVENOUS
  Administered 2013-10-28: 5 mg via INTRAVENOUS

## 2013-10-28 MED ORDER — PROPOFOL 10 MG/ML IV BOLUS
INTRAVENOUS | Status: DC | PRN
  Administered 2013-10-28: 120 mg via INTRAVENOUS
  Administered 2013-10-28 (×3): 30 mg via INTRAVENOUS

## 2013-10-28 MED ORDER — LACTATED RINGERS IV SOLN
INTRAVENOUS | Status: DC | PRN
  Administered 2013-10-28 (×2): via INTRAVENOUS

## 2013-10-28 MED ORDER — ACETAMINOPHEN 325 MG PO TABS
650.0000 mg | ORAL_TABLET | ORAL | Status: DC | PRN
  Administered 2013-10-28: 650 mg via ORAL
  Filled 2013-10-28: qty 2

## 2013-10-28 MED ORDER — BUPIVACAINE HCL (PF) 0.25 % IJ SOLN
INTRAMUSCULAR | Status: AC
  Filled 2013-10-28: qty 30

## 2013-10-28 MED ORDER — SODIUM CHLORIDE 0.9 % IV SOLN: 250.0000 mL | INTRAVENOUS | Status: DC | PRN

## 2013-10-28 MED ORDER — ONDANSETRON HCL 4 MG/2ML IJ SOLN: 4.0000 mg | Freq: Four times a day (QID) | INTRAMUSCULAR | Status: DC | PRN

## 2013-10-28 MED ORDER — GLYCOPYRROLATE 0.2 MG/ML IJ SOLN
INTRAMUSCULAR | Status: DC | PRN
  Administered 2013-10-28: 0.2 mg via INTRAVENOUS

## 2013-10-28 MED ORDER — SERTRALINE HCL 100 MG PO TABS
200.0000 mg | ORAL_TABLET | Freq: Every day | ORAL | Status: DC
  Administered 2013-10-29: 200 mg via ORAL
  Filled 2013-10-28 (×2): qty 2

## 2013-10-28 MED ORDER — PROTAMINE SULFATE 10 MG/ML IV SOLN
INTRAVENOUS | Status: DC | PRN
  Administered 2013-10-28 (×3): 10 mg via INTRAVENOUS

## 2013-10-28 MED ORDER — SODIUM CHLORIDE 0.9 % IJ SOLN
3.0000 mL | Freq: Two times a day (BID) | INTRAMUSCULAR | Status: DC
  Administered 2013-10-28: 3 mL via INTRAVENOUS

## 2013-10-28 MED ORDER — ISOPROTERENOL HCL 0.2 MG/ML IJ SOLN
1000.0000 ug | INTRAVENOUS | Status: DC | PRN
  Administered 2013-10-28: 20 ug/min via INTRAVENOUS

## 2013-10-28 NOTE — Discharge Summary (Signed)
ELECTROPHYSIOLOGY PROCEDURE DISCHARGE SUMMARY    Patient ID: Kiara [name],  MRN: [id number], DOB/AGE: 1960/08/09 53 y.o.  Admit date: 10/28/2013 Discharge date: 10/29/2013  Primary Care Physician: Londell Moh, MD Primary Cardiologist: Dietrich Pates, MD Electrophysiologist: Hillis Range, MD  Primary Discharge Diagnosis:  Persistent atrial fibrillation status post ablation this admission  Secondary Discharge Diagnosis:  1.  Asthma 2.  Hyperlipidemia 3.  Obesity 4.  Previous non ischemic cardiomyopathy felt to be tachycardia induced   Procedures This Admission:  1.  Electrophysiology study and radiofrequency catheter ablation on 10-28-13 by Dr Hillis Range.  This study demonstrated sinus rhythm upon presentation; rotational Angiography reveals a moderate sized left atrium with five separate pulmonary veins without evidence of pulmonary vein stenosis; successful electrical isolation and anatomical encircling of all five pulmonary veins with radiofrequency current; no inducible arrhythmias following ablation both on and off of Isuprel; SVC ablation was also performed today. There were no early apparent complications.   Brief HPI: Kiara [name] is a 53 y.o. female with a history of persistent atrial fibrillation.  They have failed medical therapy with Tikosyn and Amiodarone. Risks, benefits, and alternatives to catheter ablation of atrial fibrillation were reviewed with the patient who wished to proceed.  The patient underwent TEE prior to the procedure which demonstrated normal LV function and no LAA thrombus.    Hospital Course:  The patient was admitted and underwent EPS/RFCA of atrial fibrillation with details as outlined above.  They were monitored on telemetry overnight which demonstrated sinus rhythm.  Groin was without complication on the day of discharge.  The patient was examined by Dr Johney Frame and considered to be stable for discharge.  Wound care and restrictions were  reviewed with the patient.  The patient will be seen back by Dr Johney Frame in 12 weeks for post ablation follow up.   Discharge Vitals: Blood pressure 95/39, pulse 47, temperature 98.2 F (36.8 C), temperature source Oral, resp. rate 16, height 5\' 7"  (1.702 m), weight 190 lb (86.183 kg), SpO2 92.00%.  Physical Exam: Filed Vitals:   10/29/13 0305 10/29/13 0400 10/29/13 0500 10/29/13 0800  BP: 102/62 112/57 95/39   Pulse: 49 50 47   Temp: 98.3 F (36.8 C)   98.2 F (36.8 C)  TempSrc: Oral   Oral  Resp: 15 20 16    Height:      Weight:      SpO2: 92% 94% 92%     GEN- The patient is well appearing, alert and oriented x 3 today.   Head- normocephalic, atraumatic Eyes-  Sclera clear, conjunctiva pink Ears- hearing intact Oropharynx- clear Neck- supple, Lungs- Clear to ausculation bilaterally, normal work of breathing Heart- Regular rate and rhythm, no murmurs, rubs or gallops, PMI not laterally displaced GI- soft, NT, ND, + BS Extremities- no clubbing, cyanosis, or edema, groin is without hematoma/ bruit MS- no significant deformity or atrophy Skin- no rash or lesion Psych- euthymic mood, full affect Neuro- strength and sensation are intact   Labs:   Lab Results  Component Value Date   WBC 9.2 10/21/2013   HGB 13.1 10/21/2013   HCT 39.3 10/21/2013   MCV 89.9 10/21/2013   PLT 179.0 10/21/2013     Recent Labs Lab 10/29/13 0204  NA 140  K 3.7  CL 105  CO2 27  BUN 10  CREATININE 0.83  CALCIUM 8.4  GLUCOSE 104*      Discharge Medications:    Medication List    STOP taking  these medications       amiodarone 200 MG tablet  Commonly known as:  PACERONE     furosemide 20 MG tablet  Commonly known as:  LASIX     potassium chloride 10 MEQ tablet  Commonly known as:  K-DUR      TAKE these medications       acetaminophen 325 MG tablet  Commonly known as:  TYLENOL  Take 650-975 mg by mouth every 6 (six) hours as needed for mild pain.     apixaban 5 MG Tabs tablet    Commonly known as:  ELIQUIS  Take 5 mg by mouth 2 (two) times daily.     atorvastatin 40 MG tablet  Commonly known as:  LIPITOR  Take 40 mg by mouth daily at 6 PM.     lisinopril 5 MG tablet  Commonly known as:  PRINIVIL,ZESTRIL  Take 5 mg by mouth daily.     metoprolol tartrate 25 MG tablet  Commonly known as:  LOPRESSOR  Take 25 mg by mouth daily.     pantoprazole 40 MG tablet  Commonly known as:  PROTONIX  Take 1 tablet (40 mg total) by mouth daily.     sertraline 100 MG tablet  Commonly known as:  ZOLOFT  Take 200 mg by mouth every morning.        Disposition:   Follow-up Information   Follow up with Hillis Range, MD On 02/04/2014. (9:30 am)    Specialty:  Cardiology   Contact information:   [address] [place] [address] [phone]       Duration of Discharge Encounter: Greater than 30 minutes including physician time.  Signed,  Hillis Range MD

## 2013-10-28 NOTE — Transfer of Care (Signed)
Immediate Anesthesia Transfer of Care Note  Patient: Kiara [name]  Procedure(s) Performed: Procedure(s): ATRIAL FIBRILLATION ABLATION (N/A)  Patient Location: Cath Lab  Anesthesia Type:General  Level of Consciousness: awake, alert  and oriented  Airway & Oxygen Therapy: Patient Spontanous Breathing and Patient connected to nasal cannula oxygen  Post-op Assessment: Report given to PACU RN, Post -op Vital signs reviewed and stable and Patient moving all extremities X 4  Post vital signs: Reviewed and stable  Complications: No apparent anesthesia complications

## 2013-10-28 NOTE — Op Note (Signed)
SURGEON:  Hillis Range, MD  PREPROCEDURE DIAGNOSES: 1. Persistent atrial fibrillation.  POSTPROCEDURE DIAGNOSES: 1. Persistent atrial fibrillation.  PROCEDURES: 1. Comprehensive electrophysiologic study. 2. Coronary sinus pacing and recording. 3. Three-dimensional mapping of atrial fibrillation with additional mapping and ablation of a second discrete focus 4. Ablation of atrial fibrillation with additional mapping and ablation of a second discrete focus 5. Intracardiac echocardiography. 6. Transseptal puncture of an intact septum. 7. Rotational Angiography with processing at an independent workstation 8. Arrhythmia induction with pacing with isuprel infusion  INTRODUCTION:  Kiara [name] is a 53 y.o. female with a history of persistent atrial fibrillation who now presents for EP study and radiofrequency ablation.  The patient reports initially being diagnosed with atrial fibrillation after presenting with symptomatic SOB, palpitations and fatgiue. She was found to have persistent afib with a tachycardia mediated cardiomyopathy. The patient has failed medical therapy with Joice Lofts and amiodarone.  The patient therefore presents today for catheter ablation of atrial fibrillation.  DESCRIPTION OF PROCEDURE:  Informed written consent was obtained, and the patient was brought to the electrophysiology lab in a fasting state.  The patient was adequately sedated with intravenous medications as outlined in the anesthesia report.  The patient's left and right groins were prepped and draped in the usual sterile fashion by the EP lab staff.  Using a percutaneous Seldinger technique, two 7-French and one 11-French hemostasis sheaths were placed into the right common femoral vein.  3 Dimensional Rotational Angiography: A 5 french pigtail catheter was introduced through the right common femoral vein and advanced into the inferior venocava.  3 demential rotational angiography was then performed by power  injection of 100cc of nonionic contrast.  Reprocessing at an independent work station was then performed.   This demonstrated a moderate sized left atrium with 5 separate pulmonary veins.  There was a small right middle and and right inferior pulmonary vein.  The right superior, left superior and left inferior pulmonary veins were moderate in size.  A 3 dimensional rendering of the left atrium was then merged using [employer] onto the Well[employer] system and registered with intracardiac echo (see below).  The pigtail catheter was then removed.  Catheter Placement:  A 7-French Biosense Webster Decapolar coronary sinus catheter was introduced through the right common femoral vein and advanced into the coronary sinus for recording and pacing from this location.  A 6-French quadripolar Josephson catheter was introduced through the right common femoral vein and advanced into the right ventricle for recording and pacing.  This catheter was then pulled back to the His bundle location.    Initial Measurements: The patient presented to the electrophysiology lab in sinus rhythm.  Her PR interval measured 169 msec with a QRS duration of 117 msec and a QT interval of 510 msec.    Intracardiac Echocardiography: A 10-French Biosense Webster AcuNav intracardiac echocardiography catheter was introduced through the left common femoral vein and advanced into the right atrium. Intracardiac echocardiography was performed of the left atrium, and a three-dimensional anatomical rendering of the left atrium was performed using CARTO sound technology.  The patient was noted to have a moderate sized left atrium.  The interatrial septum was prominent but not aneurysmal. All 5 pulmonary veins were visualized and noted to have separate ostia.    There was a small right middle and and right inferior pulmonary vein.  The right superior, left superior and left inferior pulmonary veins were moderate in size.  The right middle  PV  ostium was very close to the right superior PV.  The left atrial appendage was visualized and did not reveal thrombus.   There was no evidence of pulmonary vein stenosis.   Transseptal Puncture: The middle right common femoral vein sheath was exchanged for an 8.5 [place] SL2 transseptal sheath and transseptal access was achieved in a standard fashion using a Brockenbrough needle under biplane fluoroscopy with intracardiac echocardiography confirmation of the transseptal puncture.  Once transseptal access had been achieved, heparin was administered intravenously and intra- arterially in order to maintain an ACT of greater than 300 seconds throughout the procedure.   3D Mapping and Ablation: The His bundle catheter was removed and in its place a 3.5 mm [employer] Thermocool ablation catheter was advanced into the right atrium.  The transseptal sheath was pulled back into the IVC over a guidewire.  The ablation catheter was advanced across the transseptal hole using the wire as a guide.  The transseptal sheath was then re-advanced over the guidewire into the left atrium.  A duodecapolar Biosense Webster circular mapping catheter was introduced through the transseptal sheath and positioned over the mouth of the pulmonary veins.  Three-dimensional electroanatomical mapping was performed using CARTO technology.  This demonstrated electrical activity within all five pulmonary veins at baseline. The patient underwent successful sequential electrical isolation and anatomical encircling of all five pulmonary veins using radiofrequency current with a circular mapping catheter as a guide.  The right middle pulmonary vein was small and was therefore isolated with the right superior pulmonary vein.  The patient had transient atrial fibrillation which terminated upon isolation of the right superior/ middle pulmonary veins.  Measurements Following Ablation: Following ablation, Isuprel was infused up to  20 mcg/min with no inducible atrial fibrillation, atrial tachycardia, atrial flutter, or sustained PACs. In sinus rhythm with RR interval was 576 msec, with PR 150 msec, QRS 115 msec, and Qtc 408 msec.  Following ablation the AH interval measured 88 msec with an HV interval of 38 msec. Ventricular pacing was performed, which revealed VA dissociation when pacing at 600 msec.  Rapid atrial pacing was performed, which revealed an AV Wenckebach cycle length of 320 msec.  Electroisolation was then again confirmed in all four pulmonary veins.  Pacing was performed along the ablation line which confirmed entrance and exit block.  The circular mapping catheter was pulled back into the right atrium and 3D mapping was performed at the junction of the superior vena cava and right atrium.  Electrical activity was observed within the SVC.  I therefore elected to perform right atrial ablation in this area.  A series of radiofrequency applications were delivered in a circular fashion around the ostium of the SVC.  Prior to each ablation lesions, pacing was performed from the distal ablation electrode to insure that diaphragmatic stimulation was not observed to avoid phrenic nerve injury.  Diaphragmatic excursion was also observed during ablation.    The procedure was therefore considered completed.  All catheters were removed, and the sheaths were aspirated and flushed.  The patient was transferred to the recovery area for sheath removal per protocol. EBL<25ml.  A limited bedside transthoracic echocardiogram revealed no pericardial effusion.  There were no early apparent complications.  CONCLUSIONS: 1. Sinus rhythm upon presentation.   2. Rotational Angiography reveals a moderate sized left atrium with five separate pulmonary veins without evidence of pulmonary vein stenosis. 3. Successful electrical isolation and anatomical encircling of all five pulmonary veins with radiofrequency current. 4. No  inducible arrhythmias  following ablation both on and off of Isuprel 5. SVC ablation was also performed today 6. No early apparent complications.   Riannon Mukherjee,MD 10:13 AM 10/28/2013

## 2013-10-28 NOTE — Progress Notes (Signed)
Site area: rt groin Site Prior to Removal:  Level 0 Pressure Applied For: 20 Manual:   20 minutes Patient Status During Pull:  stable Post Pull Site:  Level 0 Post Pull Instructions Given:  yes Post Pull Pulses Present: yes Dressing Applied:  yes Bedrest begins @ 1110 Comments: no complications

## 2013-10-28 NOTE — Anesthesia Postprocedure Evaluation (Signed)
  Anesthesia Post-op Note  Patient: Kiara [name]  Procedure(s) Performed: Procedure(s): ATRIAL FIBRILLATION ABLATION (N/A)  Patient Location: PACU  Anesthesia Type:General  Level of Consciousness: awake  Airway and Oxygen Therapy: Patient Spontanous Breathing  Post-op Pain: mild  Post-op Assessment: Post-op Vital signs reviewed  Post-op Vital Signs: Reviewed  Last Vitals:  Filed Vitals:   10/28/13 1241  BP: 118/70  Pulse: 57  Temp:   Resp: 18    Complications: No apparent anesthesia complications

## 2013-10-28 NOTE — H&P (Signed)
Kiara [name] is a 53 y.o. female with a h/o atrial fibrillation who presents for EPS and RFA of her afib. The patient was initially diagnosed with atrial fibrillation after presenting with symptoms of dyspnea/ CHF with RVR and echo revealing EF of 20-30%, probable tachycardia mediated cardiomyopathy, in June. Cardiac catherization revealed minimal CAD. She was loaded with tikosyn and failed to convert with DCCV. Amiodarone was started along with metoprolol and some bradycardia has been present, but is staying in SR and tolerating the bradycardia.. She is here to pursue catheter ablation to avoid possible long term side effects of amiodarone and better quality of life.  Today, she denies symptoms of palpitations, chest pain, shortness of breath, orthopnea, PND, lower extremity edema, dizziness, presyncope, syncope, snoring, daytime somnolence, bleeding, or neurologic sequela. The patient is tolerating medications without difficulties and is otherwise without complaint today.   Atrial Fibrillation Risk Factors:  she does not have symptoms or diagnosis of sleep apnea.  she does not have a history of rheumatic fever.  she does not have a history of alcohol use.  she has a BMI of Body mass index is 30.69 kg/(m^2).Marland Kitchen  Filed Weights    09/24/13 1446   Weight:  88.905 kg (196 lb)   LA size: 61mm   Atrial Fibrillation Management history:  Previous antiarrhythmic drugs: Tikosyn  TEE - 6/5 small Left atrial thrombus/ Noac started   Previous cardioversions: 7/6 and 7/17  Previous ablations: None  CHADS2VASC score: 2 (Female and HF)  Anticoagulation history: Eliquis 5mg  bid  Past Medical History   Diagnosis  Date   .  Asthma    .  Hyperlipemia    .  Obesity    .  Osteopenia    .  Hypothyroidism      past hx   .  Depression    .  Atrial fibrillation      a. admx with AFib with RVR and acute systolic CHF 07/2013; TEE with LAA clot and no DCCV done;   .  NICM (nonischemic cardiomyopathy)      a.  Echo (07/17/13): EF 25-30%, diff HK, basal and mid anteroseptum/ basal inferoseptum and apical AK, trivial AI, mod MR, mod LAE, mild reduced RVSF, mild RAE   .  Chronic systolic heart failure    .  Coronary artery disease      a. LHC (07/17/13): LM 20%, ostial CFX 20-30%   .  Nephrolithiasis  2013     "passed them"    Past Surgical History   Procedure  Laterality  Date   .  Appendectomy   1978   .  Abdominal hysterectomy   ~ 2001   .  Tee without cardioversion  N/A  07/18/2013     Procedure: TRANSESOPHAGEAL ECHOCARDIOGRAM (TEE); Surgeon: Lewayne Bunting, MD; Location: [facility] ENDOSCOPY; Service: Cardiovascular; Laterality: N/A;   .  Tee without cardioversion  N/A  08/18/2013     Procedure: TRANSESOPHAGEAL ECHOCARDIOGRAM (TEE); Surgeon: Donato Schultz, MD; Location: [facility] ENDOSCOPY; Service: Cardiovascular; Laterality: N/A;   .  Cardioversion  N/A  08/18/2013     Procedure: CARDIOVERSION; Surgeon: Donato Schultz, MD; Location: [facility] ENDOSCOPY; Service: Cardiovascular; Laterality: N/A;   .  Cardiac catheterization   07/2013   .  Cardioversion  N/A  08/29/2013     Procedure: CARDIOVERSION; Surgeon: Pricilla Riffle, MD; Location: [facility] OR; Service: Cardiovascular; Laterality: N/A;    Current Outpatient Prescriptions   Medication  Sig  Dispense  Refill   .  acetaminophen (TYLENOL) 325 MG tablet  Take 650 mg by mouth every 6 (six) hours as needed for mild pain.     Marland Kitchen  amiodarone (PACERONE) 400 MG tablet  Take 400 mg daily for 10 days. Then take 200 mg daily thereafter.  60 tablet  3   .  apixaban (ELIQUIS) 5 MG TABS tablet  Take 5 mg by mouth 2 (two) times daily.     Marland Kitchen  atorvastatin (LIPITOR) 40 MG tablet  Take 40 mg by mouth daily at 6 PM.     .  furosemide (LASIX) 20 MG tablet  Take 20 mg by mouth daily.     Marland Kitchen  lisinopril (PRINIVIL,ZESTRIL) 5 MG tablet  Take 5 mg by mouth daily.     .  metoprolol tartrate (LOPRESSOR) 25 MG tablet  Take 25 mg by mouth daily.     .  potassium chloride (K-DUR) 10 MEQ tablet  Take 10 mEq by  mouth daily.     .  sertraline (ZOLOFT) 100 MG tablet  Take 200 mg by mouth every morning.      No current facility-administered medications for this visit.    Allergies   Allergen  Reactions   .  Azithromycin  Diarrhea and Nausea And Vomiting    History    Social History   .  Marital Status:  Married     Spouse Name:  N/A     Number of Children:  N/A   .  Years of Education:  N/A    Occupational History   .  Not on file.    Social History Main Topics   .  Smoking status:  Never Smoker   .  Smokeless tobacco:  Never Used   .  Alcohol Use:  0.6 oz/week     1 Cans of beer per week   .  Drug Use:  No   .  Sexual Activity:  Yes    Other Topics  Concern   .  Not on file    Social History Narrative   .  No narrative on file    Family History   Problem  Relation  Age of Onset   .  Hypertension  Mother    .  Hypertension  Father    .  Lymphoma  Mother    The patient does not have a history of early familial atrial fibrillation or other arrhythmias.  ROS- All systems are reviewed and negative except as per the HPI above.  Physical Exam:  Filed Vitals:    09/24/13 1446   BP:  112/69   Pulse:  45   Height:   (1.702 m)   Weight:  88.905 kg (196 lb)   GEN- The patient is well appearing, alert and oriented x 3 today.  Head- normocephalic, atraumatic  Eyes- Sclera clear, conjunctiva pink  Ears- hearing intact  Oropharynx- clear  Neck- supple, no JVP  Lymph- no cervical lymphadenopathy  Lungs- Clear to ausculation bilaterally, normal work of breathing  Heart- Regular bradycardic rhythm,   GI- soft, NT, ND, + BS  Extremities- no clubbing, cyanosis, or edema  MS- no significant deformity or atrophy  Skin- no rash or lesion  Psych- euthymic mood, full affect  Neuro- strength and sensation are intact   TEE is reviewed  Assessment and Plan:  1. Atrial fibrillation  The patient has symptomatic persistent atrial fibrillation. The patients CHAD2VASC score is 2 she is  appropriately anticoagulated at this time.  Therapeutic  strategies for afib including medicine and ablation were discussed in detail with the patient today. Risk, benefits, and alternatives to EP study and radiofrequency ablation for afib were also discussed in detail today. These risks include but are not limited to stroke, bleeding, vascular damage, tamponade, perforation, damage to the esophagus, lungs, and other structures, pulmonary vein stenosis, worsening renal function, and death. The patient understands these risk and wishes to proceed.

## 2013-10-28 NOTE — Anesthesia Preprocedure Evaluation (Addendum)
Anesthesia Evaluation  Patient identified by MRN, date of birth, ID band Patient awake    Reviewed: Allergy & Precautions, H&P , NPO status , Patient's Chart, lab work & pertinent test results, reviewed documented beta blocker date and time   Airway Mallampati: II TM Distance: >3 FB Neck ROM: Full    Dental  (+) Dental Advisory Given, Teeth Intact   Pulmonary asthma ,          Cardiovascular hypertension, Pt. on medications and Pt. on home beta blockers + CAD + dysrhythmias Atrial Fibrillation Rhythm:Regular Rate:Normal     Neuro/Psych PSYCHIATRIC DISORDERS Depression    GI/Hepatic negative GI ROS, Neg liver ROS,   Endo/Other  Hypothyroidism   Renal/GU Renal disease     Musculoskeletal   Abdominal   Peds  Hematology   Anesthesia Other Findings   Reproductive/Obstetrics                         Anesthesia Physical Anesthesia Plan  ASA: III  Anesthesia Plan: General   Post-op Pain Management:    Induction: Intravenous  Airway Management Planned: LMA  Additional Equipment:   Intra-op Plan:   Post-operative Plan: Extubation in OR  Informed Consent: I have reviewed the patients History and Physical, chart, labs and discussed the procedure including the risks, benefits and alternatives for the proposed anesthesia with the patient or authorized representative who has indicated his/her understanding and acceptance.   Dental advisory given  Plan Discussed with: Anesthesiologist, CRNA and Surgeon  Anesthesia Plan Comments:         Anesthesia Quick Evaluation

## 2013-10-28 NOTE — Anesthesia Procedure Notes (Signed)
Procedure Name: LMA Insertion Date/Time: 10/28/2013 7:45 AM Performed by: Vita Barley E Pre-anesthesia Checklist: Patient identified, Emergency Drugs available, Suction available and Patient being monitored Patient Re-evaluated:Patient Re-evaluated prior to inductionOxygen Delivery Method: Circle system utilized Preoxygenation: Pre-oxygenation with 100% oxygen Intubation Type: IV induction Ventilation: Mask ventilation without difficulty LMA: LMA inserted LMA Size: 4.0 Number of attempts: 1 Placement Confirmation: positive ETCO2 and breath sounds checked- equal and bilateral Tube secured with: Tape Dental Injury: Teeth and Oropharynx as per pre-operative assessment

## 2013-10-28 NOTE — Progress Notes (Signed)
CARE MANAGEMENT NOTE 10/28/2013  Patient:  Kiara [name], Kiara [name]   Account Number:  [id number]  Date Initiated:  10/28/2013  Documentation initiated by:  DAVIS,RHONDA  Subjective/Objective Assessment:   Kiara [name] is a 53 y.o. female with a history of persistent atrial fibrillation who now presents for EP study and radiofrequency ablation.  Medical modality have failed to control a.Fib     Action/Plan:   home when stable   Anticipated DC Date:  10/31/2013   Anticipated DC Plan:  HOME/SELF CARE  In-house referral  NA      DC Planning Services  CM consult      PAC Choice  NA   Choice offered to / List presented to:  NA      DME agency  NA        HH agency  NA   Status of service:  In process, will continue to follow Medicare Important Message given?   (If response is "NO", the following Medicare IM given date fields will be blank) Date Medicare IM given:   Medicare IM given by:   Date Additional Medicare IM given:   Additional Medicare IM given by:    Discharge Disposition:    Per UR Regulation:  Reviewed for med. necessity/level of care/duration of stay  If discussed at Long Length of Stay Meetings, dates discussed:    Comments:  [id number]/Rhonda Davis,RN,BSN,CCM

## 2013-10-29 ENCOUNTER — Encounter (HOSPITAL_COMMUNITY): Payer: Self-pay | Admitting: *Deleted

## 2013-10-29 DIAGNOSIS — I428 Other cardiomyopathies: Secondary | ICD-10-CM

## 2013-10-29 DIAGNOSIS — I4891 Unspecified atrial fibrillation: Secondary | ICD-10-CM

## 2013-10-29 DIAGNOSIS — J45909 Unspecified asthma, uncomplicated: Secondary | ICD-10-CM | POA: Diagnosis not present

## 2013-10-29 DIAGNOSIS — E669 Obesity, unspecified: Secondary | ICD-10-CM | POA: Diagnosis not present

## 2013-10-29 DIAGNOSIS — E785 Hyperlipidemia, unspecified: Secondary | ICD-10-CM | POA: Diagnosis not present

## 2013-10-29 LAB — BASIC METABOLIC PANEL
Anion gap: 8 (ref 5–15)
BUN: 10 mg/dL (ref 6–23)
CO2: 27 mEq/L (ref 19–32)
Calcium: 8.4 mg/dL (ref 8.4–10.5)
Chloride: 105 mEq/L (ref 96–112)
Creatinine, Ser: 0.83 mg/dL (ref 0.50–1.10)
GFR calc Af Amer: >90 mL/min (ref >90)
GFR calc non Af Amer: 78 mL/min — ABNORMAL LOW (ref >90)
Glucose, Bld: 104 mg/dL — ABNORMAL HIGH (ref 70–99)
Potassium: 3.7 mEq/L (ref 3.7–5.3)
Sodium: 140 mEq/L (ref 137–147)

## 2013-10-29 MED ORDER — PANTOPRAZOLE SODIUM 40 MG PO TBEC: 40.0000 mg | DELAYED_RELEASE_TABLET | Freq: Every day | ORAL | Status: DC

## 2013-10-29 NOTE — Discharge Instructions (Signed)
No driving for 5 days. No lifting over 5 lbs for 1 week. No sexual activity for 1 week. You may return to work in 1 week. Keep procedure site clean & dry. If you notice increased pain, swelling, bleeding or pus, call/return!  You may shower, but no soaking baths/hot tubs/pools for 1 week.  ° ° °

## 2013-10-30 ENCOUNTER — Telehealth: Payer: Self-pay | Admitting: Internal Medicine

## 2013-10-30 NOTE — Telephone Encounter (Signed)
°  Patient had an ablation done on 9/15 and she is scheduled for a routine dental appt on 11/11/13 does she need antibotics prior to teeth cleaning?

## 2013-10-30 NOTE — Telephone Encounter (Signed)
lmtcb

## 2013-11-03 ENCOUNTER — Telehealth: Payer: Self-pay

## 2013-11-03 NOTE — Telephone Encounter (Signed)
Informed patient that she does not need antibiotics before her teeth cleaning next week.  Instructed patient to call the office with any further questions or concerns.

## 2013-11-17 ENCOUNTER — Other Ambulatory Visit: Payer: Self-pay | Admitting: Internal Medicine

## 2013-11-17 ENCOUNTER — Encounter: Payer: Self-pay | Admitting: Internal Medicine

## 2013-11-17 DIAGNOSIS — E041 Nontoxic single thyroid nodule: Secondary | ICD-10-CM

## 2013-11-24 ENCOUNTER — Telehealth: Payer: Self-pay | Admitting: Internal Medicine

## 2013-11-24 ENCOUNTER — Other Ambulatory Visit: Payer: Self-pay

## 2013-11-24 NOTE — Telephone Encounter (Addendum)
Last 10 days to 2 weeks has been SOB and has no energy.  Was feeling better then all of a sudden started having symptoms and it has progressively gotten worse.  We will add her on Wed morning at 8:45 per Dr Johney Frame.  Patient aware

## 2013-11-24 NOTE — Telephone Encounter (Signed)
New Message  Pt called states that she had an ablation completed.. was doing well until about 1 weeks ago.. where she began to have SOB that worsened. Pt went out of town and was miserable. Pt reports she has headaches and it feels as if, someone has punched her in the chest.. Please call back to discuss

## 2013-11-25 ENCOUNTER — Ambulatory Visit
Admission: RE | Admit: 2013-11-25 | Discharge: 2013-11-25 | Disposition: A | Discharge status: Home / Self Care | Payer: BC Managed Care – PPO | Source: Ambulatory Visit | Attending: Internal Medicine | Admitting: Internal Medicine

## 2013-11-25 DIAGNOSIS — E041 Nontoxic single thyroid nodule: Secondary | ICD-10-CM

## 2013-11-26 ENCOUNTER — Ambulatory Visit (INDEPENDENT_AMBULATORY_CARE_PROVIDER_SITE_OTHER)
Admission: RE | Admit: 2013-11-26 | Discharge: 2013-11-26 | Disposition: A | Discharge status: Home / Self Care | Payer: BC Managed Care – PPO | Source: Ambulatory Visit | Attending: Internal Medicine | Admitting: Internal Medicine

## 2013-11-26 ENCOUNTER — Other Ambulatory Visit: Payer: Self-pay | Admitting: Internal Medicine

## 2013-11-26 ENCOUNTER — Ambulatory Visit (INDEPENDENT_AMBULATORY_CARE_PROVIDER_SITE_OTHER): Payer: BC Managed Care – PPO | Admitting: Internal Medicine

## 2013-11-26 ENCOUNTER — Encounter: Payer: Self-pay | Admitting: Internal Medicine

## 2013-11-26 VITALS — BP 110/72 | HR 52 | Ht 67.0 in | Wt 193.4 lb

## 2013-11-26 DIAGNOSIS — I480 Paroxysmal atrial fibrillation: Secondary | ICD-10-CM

## 2013-11-26 DIAGNOSIS — I428 Other cardiomyopathies: Secondary | ICD-10-CM

## 2013-11-26 DIAGNOSIS — R0602 Shortness of breath: Secondary | ICD-10-CM

## 2013-11-26 DIAGNOSIS — R609 Edema, unspecified: Secondary | ICD-10-CM

## 2013-11-26 DIAGNOSIS — E041 Nontoxic single thyroid nodule: Secondary | ICD-10-CM

## 2013-11-26 DIAGNOSIS — R001 Bradycardia, unspecified: Secondary | ICD-10-CM

## 2013-11-26 DIAGNOSIS — I429 Cardiomyopathy, unspecified: Secondary | ICD-10-CM

## 2013-11-26 DIAGNOSIS — I5022 Chronic systolic (congestive) heart failure: Secondary | ICD-10-CM

## 2013-11-26 MED ORDER — FUROSEMIDE 20 MG PO TABS: 20.0000 mg | ORAL_TABLET | Freq: Every day | ORAL | Status: DC

## 2013-11-26 MED ORDER — POTASSIUM CHLORIDE ER 10 MEQ PO TBCR: 10.0000 meq | EXTENDED_RELEASE_TABLET | Freq: Every day | ORAL | Status: DC

## 2013-11-26 MED ORDER — COLCHICINE 0.6 MG PO TABS: ORAL_TABLET | ORAL | Status: DC

## 2013-11-26 NOTE — Progress Notes (Signed)
PCP:  Londell Moh, MD  The patient presents today for electrophysiology followup.  Since her recent ablation, the patient reports doing reasonably well.  She has been having progressive SOB with moderate activity as well as pleuritic chest pain.  She has a dry cough.  She denies fevers, chills, odynophagia, bleeding, or other concerns.  Today, she denies symptoms of palpitations, orthopnea, PND, lower extremity edema, dizziness, presyncope, syncope, or neurologic sequela.  The patient feels that she is tolerating medications without difficulties and is otherwise without complaint today.   Past Medical History  Diagnosis Date  . Asthma   . Hyperlipemia   . Obesity   . Osteopenia   . Hypothyroidism     past hx  . Depression   . Atrial fibrillation     a. admx with AFib with RVR and acute systolic CHF 07/2013; TEE with LAA clot and no DCCV done;    . NICM (nonischemic cardiomyopathy)     a. Echo (07/17/13):  EF 25-30%, diff HK, basal and mid anteroseptum/ basal inferoseptum and apical AK, trivial AI, mod MR, mod LAE, mild reduced RVSF, mild RAE  . Chronic systolic heart failure   . Coronary artery disease     a.  LHC (07/17/13):  LM 20%, ostial CFX 20-30%  . Nephrolithiasis 2013    "passed them"   Past Surgical History  Procedure Laterality Date  . Appendectomy  1978  . Abdominal hysterectomy  ~ 2001  . Tee without cardioversion N/A 07/18/2013    Procedure: TRANSESOPHAGEAL ECHOCARDIOGRAM (TEE);  Surgeon: Lewayne Bunting, MD;  Location: [facility] ENDOSCOPY;  Service: Cardiovascular;  Laterality: N/A;  . Tee without cardioversion N/A 08/18/2013    Procedure: TRANSESOPHAGEAL ECHOCARDIOGRAM (TEE);  Surgeon: Donato Schultz, MD;  Location: [facility] ENDOSCOPY;  Service: Cardiovascular;  Laterality: N/A;  . Cardioversion N/A 08/18/2013    Procedure: CARDIOVERSION;  Surgeon: Donato Schultz, MD;  Location: [facility] ENDOSCOPY;  Service: Cardiovascular;  Laterality: N/A;  . Cardiac catheterization  07/2013  .  Cardioversion N/A 08/29/2013    Procedure: CARDIOVERSION;  Surgeon: Pricilla Riffle, MD;  Location: [facility] OR;  Service: Cardiovascular;  Laterality: N/A;  . Tee without cardioversion N/A 10/27/2013    Procedure: TRANSESOPHAGEAL ECHOCARDIOGRAM (TEE);  Surgeon: Pricilla Riffle, MD;  Location: [facility] ENDOSCOPY;  Service: Cardiovascular;  Laterality: N/A;  . Ablation  10/28/13    PVI by Dr Johney Frame    Current Outpatient Prescriptions  Medication Sig Dispense Refill  . acetaminophen (TYLENOL) 325 MG tablet Take 650-975 mg by mouth every 6 (six) hours as needed for mild pain.       Marland Kitchen apixaban (ELIQUIS) 5 MG TABS tablet Take 5 mg by mouth 2 (two) times daily.      Marland Kitchen atorvastatin (LIPITOR) 40 MG tablet Take 40 mg by mouth daily at 6 PM.      . lisinopril (PRINIVIL,ZESTRIL) 5 MG tablet Take 5 mg by mouth daily.      . metoprolol tartrate (LOPRESSOR) 25 MG tablet Take 25 mg by mouth daily.      . pantoprazole (PROTONIX) 40 MG tablet Take 1 tablet (40 mg total) by mouth daily.  60 tablet  0  . sertraline (ZOLOFT) 100 MG tablet Take 200 mg by mouth every morning.       . colchicine 0.6 MG tablet Take one by mouth twice daily for 3 days then decrease to one daily  35 tablet  0  . furosemide (LASIX) 20 MG tablet Take 1 tablet (20 mg  total) by mouth daily.  90 tablet  3  . potassium chloride (K-DUR) 10 MEQ tablet Take 1 tablet (10 mEq total) by mouth daily.  90 tablet  3   No current facility-administered medications for this visit.    Allergies  Allergen Reactions  . Azithromycin Diarrhea and Nausea And Vomiting    History   Social History  . Marital Status: Married    Spouse Name: N/A    Number of Children: N/A  . Years of Education: N/A   Occupational History  . Not on file.   Social History Main Topics  . Smoking status: Never Smoker   . Smokeless tobacco: Never Used  . Alcohol Use: 0.6 oz/week    1 Cans of beer per week  . Drug Use: No  . Sexual Activity: Yes   Other Topics Concern  . Not on  file   Social History Narrative  . No narrative on file    Family History  Problem Relation Age of Onset  . Hypertension Mother   . Hypertension Father   . Lymphoma Mother     ROS-  All systems are reviewed and are negative except as outlined in the HPI above  Physical Exam: Filed Vitals:   11/26/13 0907  BP: 110/72  Pulse: 52  Height: 5\' 7"  (1.702 m)  Weight: 193 lb 6.4 oz (87.726 kg)  SpO2: 93%    GEN- The patient is well appearing, alert and oriented x 3 today.   Head- normocephalic, atraumatic Eyes-  Sclera clear, conjunctiva pink Ears- hearing intact Oropharynx- clear Neck- supple,   Lungs- bibasilar rales, normal work of breathing Heart- Regular rate and rhythm, no murmurs, rubs or gallops, PMI not laterally displaced GI- soft, NT, ND, + BS Extremities- no clubbing, cyanosis, or edema MS- no significant deformity or atrophy Skin- no rash or lesion Psych- euthymic mood, full affect Neuro- strength and sensation are intact  ekg today reveals sinus rhythm 52 bpm, PR 158, QRS 88, QTc 429, otherwise normal ekg Limited echo today reveal no pericardial effusion  Assessment and Plan:  1. afib Recovering post ablation She likely has pericardial inflammation post ablation.  I will obtain a cxr today.  Cath 6/15 is reviewed and revealed nonobstructive CAD.  My suspicion for ischemia is therefore reduced.  Given anticoagulation, I think that PTE would also be unlikely. At this point I will restart lasix 20mg  daily  (which I stopped at the hospital). Also start colchicine 0.6mg  BID x 3 days then 0.6mg  daily thereafter   2. Nonischemic CM/ acute on chronic systolic dysfunction Restart lasix as above  Return next week for follow-up

## 2013-11-26 NOTE — Patient Instructions (Signed)
Your physician recommends that you schedule a follow-up appointment on Monday with Rudi Coco, NP and Dr Johney Frame at 11:15am  A chest x-ray takes a picture of the organs and structures inside the chest, including the heart, lungs, and blood vessels. This test can show several things, including, whether the heart is enlarges; whether fluid is building up in the lungs; and whether pacemaker / defibrillator leads are still in place.  Your physician has recommended you make the following change in your medication:  1) Start Furosemide 20mg  daily 2) Start potassium daily 3) Start Colchicine 0.6mg  twice daily for 3 days then decrease to once daily (1 month only)

## 2013-12-01 ENCOUNTER — Ambulatory Visit (INDEPENDENT_AMBULATORY_CARE_PROVIDER_SITE_OTHER): Payer: BC Managed Care – PPO | Admitting: Internal Medicine

## 2013-12-01 ENCOUNTER — Encounter: Payer: Self-pay | Admitting: Internal Medicine

## 2013-12-01 VITALS — BP 110/78 | HR 60 | Ht 67.0 in | Wt 193.4 lb

## 2013-12-01 DIAGNOSIS — I4891 Unspecified atrial fibrillation: Secondary | ICD-10-CM

## 2013-12-01 DIAGNOSIS — I5022 Chronic systolic (congestive) heart failure: Secondary | ICD-10-CM

## 2013-12-01 MED ORDER — METOPROLOL TARTRATE 25 MG PO TABS: 12.5000 mg | ORAL_TABLET | Freq: Two times a day (BID) | ORAL | Status: DC

## 2013-12-01 NOTE — Patient Instructions (Signed)
Your physician recommends that you schedule a follow-up appointment with Dr Johney Frame week of 12/29/13   Your physician has recommended you make the following change in your medication:  1) Stop Furosemide  2) Stop Potassium 3) Decrease Metoprolol to 12.5mg  twice daily

## 2013-12-01 NOTE — Progress Notes (Signed)
PCP:  Londell MohPHARR,WALTER DAVIDSON, MD  The patient presents today for electrophysiology followup.  Since her recent visit, her SOB is a little better.  She has a dry cough.  She denies fevers, chills, odynophagia, bleeding, or other concerns.  Her chest pain is also improving. On last visit, chest xray was done and negative for significant findings. Last cardiac cath was in June with insignificant disease. Low  probability for PE being on eliquis. She was prescribed colchicine,and lasix that she reports helped marginally. Chest discomfort is worse with deep breathing and movement of upper torso. Has had two episodes of Afib (ERAF) both for 2-3 hours in the early am. Suggested that she take her usual metoprolol tartrate 25 mg qd, 12.5 mg bid instead, may give her better coverage for arrythmia.   Today, she denies symptoms of palpitations, orthopnea, PND, lower extremity edema, dizziness, presyncope, syncope, or neurologic sequela.  The patient feels that she is tolerating medications without difficulties and is otherwise without complaint today.   Past Medical History  Diagnosis Date  . Asthma   . Hyperlipemia   . Obesity   . Osteopenia   . Hypothyroidism     past hx  . Depression   . Atrial fibrillation     a. admx with AFib with RVR and acute systolic CHF 07/2013; TEE with LAA clot and no DCCV done;    . NICM (nonischemic cardiomyopathy)     a. Echo (07/17/13):  EF 25-30%, diff HK, basal and mid anteroseptum/ basal inferoseptum and apical AK, trivial AI, mod MR, mod LAE, mild reduced RVSF, mild RAE  . Chronic systolic heart failure   . Coronary artery disease     a.  LHC (07/17/13):  LM 20%, ostial CFX 20-30%  . Nephrolithiasis 2013    "passed them"   Past Surgical History  Procedure Laterality Date  . Appendectomy  1978  . Abdominal hysterectomy  ~ 2001  . Tee without cardioversion N/A 07/18/2013    Procedure: TRANSESOPHAGEAL ECHOCARDIOGRAM (TEE);  Surgeon: Lewayne BuntingBrian S Crenshaw, MD;  [facility];  Service: Cardiovascular;  Laterality: N/A;  . Tee without cardioversion N/A 08/18/2013    Procedure: TRANSESOPHAGEAL ECHOCARDIOGRAM (TEE);  Surgeon: Donato SchultzMark Skains, MD;  [facility];  Service: Cardiovascular;  Laterality: N/A;  . Cardioversion N/A 08/18/2013    Procedure: CARDIOVERSION;  Surgeon: Donato SchultzMark Skains, MD;  [facility];  Service: Cardiovascular;  Laterality: N/A;  . Cardiac catheterization  07/2013  . Cardioversion N/A 08/29/2013    Procedure: CARDIOVERSION;  Surgeon: Pricilla RifflePaula Ross V, MD;  [facility];  Service: Cardiovascular;  Laterality: N/A;  . Tee without cardioversion N/A 10/27/2013    Procedure: TRANSESOPHAGEAL ECHOCARDIOGRAM (TEE);  Surgeon: Pricilla RifflePaula Ross V, MD;  [facility];  Service: Cardiovascular;  Laterality: N/A;  . Ablation  10/28/13    PVI by Dr Johney FrameAllred    Current Outpatient Prescriptions  Medication Sig Dispense Refill  . acetaminophen (TYLENOL) 325 MG tablet Take 650-975 mg by mouth every 6 (six) hours as needed for mild pain.       Marland Kitchen. apixaban (ELIQUIS) 5 MG TABS tablet Take 5 mg by mouth 2 (two) times daily.      Marland Kitchen. atorvastatin (LIPITOR) 40 MG tablet Take 40 mg by mouth daily at 6 PM.      . colchicine 0.6 MG tablet Take 0.6 mg by mouth daily.      Marland Kitchen. lisinopril (PRINIVIL,ZESTRIL) 5 MG tablet Take 5 mg by mouth daily.      .Marland Kitchen  metoprolol tartrate (LOPRESSOR) 25 MG tablet Take 0.5 tablets (12.5 mg total) by mouth 2 (two) times daily.      . pantoprazole (PROTONIX) 40 MG tablet Take 1 tablet (40 mg total) by mouth daily.  60 tablet  0  . sertraline (ZOLOFT) 100 MG tablet Take 200 mg by mouth every morning.        No current facility-administered medications for this visit.    Allergies  Allergen Reactions  . Azithromycin Diarrhea and Nausea And Vomiting    History   Social History  . Marital Status: Married    Spouse Name: N/A    Number of Children: N/A  . Years of Education: N/A   Occupational History  . Not on file.    Social History Main Topics  . Smoking status: Never Smoker   . Smokeless tobacco: Never Used  . Alcohol Use: 0.6 oz/week    1 Cans of beer per week  . Drug Use: No  . Sexual Activity: Yes   Other Topics Concern  . Not on file   Social History Narrative  . No narrative on file    Family History  Problem Relation Age of Onset  . Hypertension Mother   . Hypertension Father   . Lymphoma Mother     ROS-  All systems are reviewed and are negative except as outlined in the HPI above  Physical Exam: Filed Vitals:   12/01/13 1134  BP: 110/78  Pulse: 60  Height: 5\' 7"  (1.702 m)  Weight: 193 lb 6.4 oz (87.726 kg)    GEN- The patient is well appearing, alert and oriented x 3 today.   Head- normocephalic, atraumatic Eyes-  Sclera clear, conjunctiva pink Ears- hearing intact Oropharynx- clear Neck- supple,   Lungs- clear, normal work of breathing Heart- Regular rate and rhythm, no murmurs, rubs or gallops, PMI not laterally displaced GI- soft, NT, ND, + BS Extremities- no clubbing, cyanosis, or edema MS- no significant deformity or atrophy Skin- no rash or lesion Psych- euthymic mood, full affect Neuro- strength and sensation are intact  ekg today reveals sinus rhythm 60 bpm, PR 152, QRS 86, QTc 428,  normal ekg Limited echo from 10/14 revealed no pericardial effusion  Assessment and Plan:  1. afib Recovering post ablation She likely has pericardial inflammation post ablation. She has improved somewhat over the last week, symptoms are definitely no worse. She can stop lasix 20mg  daily . Continue colchicine 0.6mg  daily for at least the next month.  2. Nonischemic CM/ acute on chronic systolic dysfunction Stable. No fluid retention. Can stop lasix but if daily weight goes up, she can take to maintain normovolemia.  Return 3 weeks, sooner if symptoms worsen.

## 2013-12-23 ENCOUNTER — Other Ambulatory Visit: Payer: Self-pay | Admitting: Internal Medicine

## 2013-12-31 ENCOUNTER — Ambulatory Visit: Payer: BC Managed Care – PPO | Admitting: Internal Medicine

## 2014-01-13 ENCOUNTER — Telehealth: Payer: Self-pay | Admitting: *Deleted

## 2014-01-13 NOTE — Telephone Encounter (Signed)
New Message  Pt with ablation in Sept, 3 month appt was supposed to be on the dec 23rd but it was resch to Nov 18. She wants to see Dr. Johney Frame before the end of the year. Please call and discuss

## 2014-01-16 ENCOUNTER — Other Ambulatory Visit: Payer: Self-pay

## 2014-01-16 ENCOUNTER — Ambulatory Visit (INDEPENDENT_AMBULATORY_CARE_PROVIDER_SITE_OTHER): Payer: BC Managed Care – PPO | Admitting: Internal Medicine

## 2014-01-16 ENCOUNTER — Encounter: Payer: Self-pay | Admitting: Internal Medicine

## 2014-01-16 VITALS — BP 106/76 | HR 50 | Ht 67.0 in | Wt 200.6 lb

## 2014-01-16 DIAGNOSIS — I5022 Chronic systolic (congestive) heart failure: Secondary | ICD-10-CM

## 2014-01-16 DIAGNOSIS — I429 Cardiomyopathy, unspecified: Secondary | ICD-10-CM

## 2014-01-16 DIAGNOSIS — I428 Other cardiomyopathies: Secondary | ICD-10-CM

## 2014-01-16 DIAGNOSIS — I4891 Unspecified atrial fibrillation: Secondary | ICD-10-CM

## 2014-01-16 NOTE — Patient Instructions (Signed)
Your physician recommends that you schedule a follow-up appointment in: 3 months with Dr. Johney FrameAllred  Your physician has requested that you have an echocardiogram. Echocardiography is a painless test that uses sound waves to create images of your heart. It provides your doctor with information about the size and shape of your heart and how well your heart's chambers and valves are working. This procedure takes approximately one hour. There are no restrictions for this procedure.   Your physician has recommended you make the following change in your medication:  1) Stop Colchicine Low-Sodium Eating Plan Sodium raises blood pressure and causes water to be held in the body. Getting less sodium from food will help lower your blood pressure, reduce any swelling, and protect your heart, liver, and kidneys. We get sodium by adding salt (sodium chloride) to food. Most of our sodium comes from canned, boxed, and frozen foods. Restaurant foods, fast foods, and pizza are also very high in sodium. Even if you take medicine to lower your blood pressure or to reduce fluid in your body, getting less sodium from your food is important. WHAT IS MY PLAN? Most people should limit their sodium intake to 2,300 mg a day. Your health care provider recommends that you limit your sodium intake to 4 grams a day.  WHAT DO I NEED TO KNOW ABOUT THIS EATING PLAN? For the low-sodium eating plan, you will follow these general guidelines:  Choose foods with a % Daily Value for sodium of less than 5% (as listed on the food label).   Use salt-free seasonings or herbs instead of table salt or sea salt.   Check with your health care provider or pharmacist before using salt substitutes.   Eat fresh foods.  Eat more vegetables and fruits.  Limit canned vegetables. If you do use them, rinse them well to decrease the sodium.   Limit cheese to 1 oz (28 g) per day.   Eat lower-sodium products, often labeled as "lower sodium" or  "no salt added."  Avoid foods that contain monosodium glutamate (MSG). MSG is sometimes added to [place] food and some canned foods.  Check food labels (Nutrition Facts labels) on foods to learn how much sodium is in one serving.  Eat more home-cooked food and less restaurant, buffet, and fast food.  When eating at a restaurant, ask that your food be prepared with less salt or none, if possible.  HOW DO I READ FOOD LABELS FOR SODIUM INFORMATION? The Nutrition Facts label lists the amount of sodium in one serving of the food. If you eat more than one serving, you must multiply the listed amount of sodium by the number of servings. Food labels may also identify foods as:  Sodium free--Less than 5 mg in a serving.  Very low sodium--35 mg or less in a serving.  Low sodium--140 mg or less in a serving.  Light in sodium--50% less sodium in a serving. For example, if a food that usually has 300 mg of sodium is changed to become light in sodium, it will have 150 mg of sodium.  Reduced sodium--25% less sodium in a serving. For example, if a food that usually has 400 mg of sodium is changed to reduced sodium, it will have 300 mg of sodium. WHAT FOODS CAN I EAT? Grains Low-sodium cereals, including oats, puffed wheat and rice, and shredded wheat cereals. Low-sodium crackers. Unsalted rice and pasta. Lower-sodium bread.  Vegetables Frozen or fresh vegetables. Low-sodium or reduced-sodium canned vegetables. Low-sodium or reduced-sodium  tomato sauce and paste. Low-sodium or reduced-sodium tomato and vegetable juices.  Fruits Fresh, frozen, and canned fruit. Fruit juice.  Meat and Other Protein Products Low-sodium canned tuna and salmon. Fresh or frozen meat, poultry, seafood, and fish. Lamb. Unsalted nuts. Dried beans, peas, and lentils without added salt. Unsalted canned beans. Homemade soups without salt. Eggs.  Dairy Milk. Soy milk. Ricotta cheese. Low-sodium or reduced-sodium cheeses.  Yogurt.  Condiments Fresh and dried herbs and spices. Salt-free seasonings. Onion and garlic powders. Low-sodium varieties of mustard and ketchup. Lemon juice.  Fats and Oils Reduced-sodium salad dressings. Unsalted butter.  Other Unsalted popcorn and pretzels.  The items listed above may not be a complete list of recommended foods or beverages. Contact your dietitian for more options. WHAT FOODS ARE NOT RECOMMENDED? Grains Instant hot cereals. Bread stuffing, pancake, and biscuit mixes. Croutons. Seasoned rice or pasta mixes. Noodle soup cups. Boxed or frozen macaroni and cheese. Self-rising flour. Regular salted crackers. Vegetables Regular canned vegetables. Regular canned tomato sauce and paste. Regular tomato and vegetable juices. Frozen vegetables in sauces. Salted french fries. Olives. Rosita Fire. Relishes. Sauerkraut. Salsa. Meat and Other Protein Products Salted, canned, smoked, spiced, or pickled meats, seafood, or fish. Bacon, ham, sausage, hot dogs, corned beef, chipped beef, and packaged luncheon meats. Salt pork. Jerky. Pickled herring. Anchovies, regular canned tuna, and sardines. Salted nuts. Dairy Processed cheese and cheese spreads. Cheese curds. Blue cheese and cottage cheese. Buttermilk.  Condiments Onion and garlic salt, seasoned salt, table salt, and sea salt. Canned and packaged gravies. Worcestershire sauce. Tartar sauce. Barbecue sauce. Teriyaki sauce. Soy sauce, including reduced sodium. Steak sauce. Fish sauce. Oyster sauce. Cocktail sauce. Horseradish. Regular ketchup and mustard. Meat flavorings and tenderizers. Bouillon cubes. Hot sauce. Tabasco sauce. Marinades. Taco seasonings. Relishes. Fats and Oils Regular salad dressings. Salted butter. Margarine. Ghee. Bacon fat.  Other Potato and tortilla chips. Corn chips and puffs. Salted popcorn and pretzels. Canned or dried soups. Pizza. Frozen entrees and pot pies.  The items listed above may not be a  complete list of foods and beverages to avoid. Contact your dietitian for more information. Document Released: 07/22/2001 Document Revised: 02/04/2013 Document Reviewed: 12/04/2012 [facility] Patient Information 2015 [place], [place]. This information is not intended to replace advice given to you by your health care provider. Make sure you discuss any questions you have with your health care provider.

## 2014-01-18 NOTE — Progress Notes (Signed)
PCP:  Londell MohPHARR,WALTER DAVIDSON, MD  The patient presents today for electrophysiology followup.  Since her recent visit, she has done "much better".  Her cough and SOB are resolved.  She has occasional abdominal fullness/ rib tightness.  Today, she denies symptoms of palpitations, orthopnea, PND, lower extremity edema, dizziness, presyncope, syncope, or neurologic sequela.  The patient feels that she is tolerating medications without difficulties and is otherwise without complaint today.   Past Medical History  Diagnosis Date  . Asthma   . Hyperlipemia   . Obesity   . Osteopenia   . Hypothyroidism     past hx  . Depression   . Atrial fibrillation     a. admx with AFib with RVR and acute systolic CHF 07/2013; TEE with LAA clot and no DCCV done;    . NICM (nonischemic cardiomyopathy)     a. Echo (07/17/13):  EF 25-30%, diff HK, basal and mid anteroseptum/ basal inferoseptum and apical AK, trivial AI, mod MR, mod LAE, mild reduced RVSF, mild RAE  . Chronic systolic heart failure   . Coronary artery disease     a.  LHC (07/17/13):  LM 20%, ostial CFX 20-30%  . Nephrolithiasis 2013    "passed them"   Past Surgical History  Procedure Laterality Date  . Appendectomy  1978  . Abdominal hysterectomy  ~ 2001  . Tee without cardioversion N/A 07/18/2013    Procedure: TRANSESOPHAGEAL ECHOCARDIOGRAM (TEE);  Surgeon: Lewayne BuntingBrian S Crenshaw, MD;  [facility];  Service: Cardiovascular;  Laterality: N/A;  . Tee without cardioversion N/A 08/18/2013    Procedure: TRANSESOPHAGEAL ECHOCARDIOGRAM (TEE);  Surgeon: Donato SchultzMark Skains, MD;  [facility];  Service: Cardiovascular;  Laterality: N/A;  . Cardioversion N/A 08/18/2013    Procedure: CARDIOVERSION;  Surgeon: Donato SchultzMark Skains, MD;  [facility];  Service: Cardiovascular;  Laterality: N/A;  . Cardiac catheterization  07/2013  . Cardioversion N/A 08/29/2013    Procedure: CARDIOVERSION;  Surgeon: Pricilla RifflePaula Ross V, MD;  [facility];  Service:  Cardiovascular;  Laterality: N/A;  . Tee without cardioversion N/A 10/27/2013    Procedure: TRANSESOPHAGEAL ECHOCARDIOGRAM (TEE);  Surgeon: Pricilla RifflePaula Ross V, MD;  [facility];  Service: Cardiovascular;  Laterality: N/A;  . Ablation  10/28/13    PVI by Dr Johney FrameAllred    Current Outpatient Prescriptions  Medication Sig Dispense Refill  . acetaminophen (TYLENOL) 325 MG tablet Take 650-975 mg by mouth every 6 (six) hours as needed for mild pain.     Marland Kitchen. apixaban (ELIQUIS) 5 MG TABS tablet Take 5 mg by mouth 2 (two) times daily.    Marland Kitchen. atorvastatin (LIPITOR) 40 MG tablet Take 40 mg by mouth daily at 6 PM.    . furosemide (LASIX) 20 MG tablet Take 20 mg by mouth daily.    Marland Kitchen. lisinopril (PRINIVIL,ZESTRIL) 5 MG tablet Take 5 mg by mouth daily.    . metoprolol tartrate (LOPRESSOR) 25 MG tablet Take 0.5 tablets (12.5 mg total) by mouth 2 (two) times daily.    . pantoprazole (PROTONIX) 40 MG tablet TAKE 1 TABLET BY MOUTH EVERY DAY 60 tablet 0  . potassium chloride (K-DUR,KLOR-CON) 10 MEQ tablet Take 10 mEq by mouth daily.    . sertraline (ZOLOFT) 100 MG tablet Take 200 mg by mouth every morning.      No current facility-administered medications for this visit.    Allergies  Allergen Reactions  . Azithromycin Diarrhea and Nausea And Vomiting    History   Social History  .  Marital Status: Married    Spouse Name: N/A    Number of Children: N/A  . Years of Education: N/A   Occupational History  . Not on file.   Social History Main Topics  . Smoking status: Never Smoker   . Smokeless tobacco: Never Used  . Alcohol Use: 0.6 oz/week    1 Cans of beer per week  . Drug Use: No  . Sexual Activity: Yes   Other Topics Concern  . Not on file   Social History Narrative    Family History  Problem Relation Age of Onset  . Hypertension Mother   . Hypertension Father   . Lymphoma Mother     ROS-  All systems are reviewed and are negative except as outlined in the HPI above  Physical  Exam: Filed Vitals:   01/16/14 1307  BP: 106/76  Pulse: 50  Height: 5\' 7"  (1.702 m)  Weight: 200 lb 9.6 oz (90.992 kg)    GEN- The patient is well appearing, alert and oriented x 3 today.   Head- normocephalic, atraumatic Eyes-  Sclera clear, conjunctiva pink Ears- hearing intact Oropharynx- clear Neck- supple,   Lungs- clear, normal work of breathing Heart- Regular rate and rhythm, no murmurs, rubs or gallops, PMI not laterally displaced GI- soft, NT, ND, + BS Extremities- no clubbing, cyanosis, or edema MS- no significant deformity or atrophy Skin- no rash or lesion Psych- euthymic mood, full affect Neuro- strength and sensation are intact  ekg today reveals sinus rhythm   Assessment and Plan:  1. afib No afib post ablation off of AAD therapy Stop colchocine Continue anticoagulation  2. Nonischemic CM/ acute on chronic systolic dysfunction Stable. No fluid retention. She has restarted lasix 20mg  daily and feels better with this Will repeat echo to see if EF has recovered with sinus rhythm. If EF remains depressed, will refer to cardiology for management.  I am optimistic that her EF will recover.  Return in 3 months

## 2014-01-20 ENCOUNTER — Ambulatory Visit (HOSPITAL_COMMUNITY): Payer: BC Managed Care – PPO | Attending: Internal Medicine | Admitting: Radiology

## 2014-01-20 ENCOUNTER — Other Ambulatory Visit: Payer: Self-pay

## 2014-01-20 DIAGNOSIS — E785 Hyperlipidemia, unspecified: Secondary | ICD-10-CM | POA: Diagnosis not present

## 2014-01-20 DIAGNOSIS — I5022 Chronic systolic (congestive) heart failure: Secondary | ICD-10-CM

## 2014-01-20 DIAGNOSIS — I428 Other cardiomyopathies: Secondary | ICD-10-CM

## 2014-01-20 DIAGNOSIS — J45909 Unspecified asthma, uncomplicated: Secondary | ICD-10-CM | POA: Insufficient documentation

## 2014-01-20 DIAGNOSIS — I429 Cardiomyopathy, unspecified: Secondary | ICD-10-CM

## 2014-01-20 DIAGNOSIS — R001 Bradycardia, unspecified: Secondary | ICD-10-CM | POA: Insufficient documentation

## 2014-01-22 ENCOUNTER — Encounter (HOSPITAL_COMMUNITY): Payer: Self-pay | Admitting: Cardiovascular Disease

## 2014-02-03 NOTE — Progress Notes (Signed)
Echocardiogram performed by Kwong Chung RDCS  

## 2014-02-04 ENCOUNTER — Encounter: Payer: BC Managed Care – PPO | Admitting: Internal Medicine

## 2014-02-23 ENCOUNTER — Other Ambulatory Visit: Payer: Self-pay | Admitting: Internal Medicine

## 2014-02-25 ENCOUNTER — Other Ambulatory Visit: Payer: Self-pay | Admitting: Obstetrics and Gynecology

## 2014-02-26 LAB — CYTOLOGY - PAP

## 2014-04-03 ENCOUNTER — Telehealth: Payer: Self-pay | Admitting: Internal Medicine

## 2014-04-03 NOTE — Telephone Encounter (Signed)
New Message  Pt called to make 6 mo ablation f/u (4/14) and wanted to check if this appt is okay or if she should come in sooner. Please call back and discuss.

## 2014-04-03 NOTE — Telephone Encounter (Signed)
Calling stating she was given an appointment with Dr. Johney Frame for her 3 mo check on 05/28/14 and wanted to know if that was OK.  Advised that was fine unless she was having symptoms.  States she has been having some fast heart rate and occ CP.  Advised could work her in the first of the week with a NP or PA or she can be worked in to see Dr. Johney Frame on 2/29 at 1:30.  States she wanted to see how she felt over the weekend but for now will take the appointment with Dr. Johney Frame on 2/29 at 1:30.  She understands to call us back on Monday if symptoms become worse or more consistent.

## 2014-04-03 NOTE — Telephone Encounter (Signed)
lmtcb

## 2014-04-13 ENCOUNTER — Other Ambulatory Visit: Payer: Self-pay

## 2014-04-13 ENCOUNTER — Encounter: Payer: Self-pay | Admitting: Internal Medicine

## 2014-04-13 ENCOUNTER — Ambulatory Visit (INDEPENDENT_AMBULATORY_CARE_PROVIDER_SITE_OTHER): Payer: BLUE CROSS/BLUE SHIELD | Admitting: Internal Medicine

## 2014-04-13 VITALS — BP 108/70 | HR 49 | Ht 67.0 in | Wt 198.6 lb

## 2014-04-13 DIAGNOSIS — I4891 Unspecified atrial fibrillation: Secondary | ICD-10-CM

## 2014-04-13 DIAGNOSIS — I5022 Chronic systolic (congestive) heart failure: Secondary | ICD-10-CM

## 2014-04-13 DIAGNOSIS — R5383 Other fatigue: Secondary | ICD-10-CM

## 2014-04-13 DIAGNOSIS — I429 Cardiomyopathy, unspecified: Secondary | ICD-10-CM

## 2014-04-13 DIAGNOSIS — I428 Other cardiomyopathies: Secondary | ICD-10-CM

## 2014-04-13 DIAGNOSIS — R001 Bradycardia, unspecified: Secondary | ICD-10-CM

## 2014-04-13 HISTORY — DX: Other fatigue: R53.83

## 2014-04-13 MED ORDER — METOPROLOL TARTRATE 25 MG PO TABS: 12.5000 mg | ORAL_TABLET | ORAL | Status: DC | PRN

## 2014-04-13 NOTE — Progress Notes (Signed)
PCP:  Londell Moh, MD  The patient presents today for electrophysiology followup. Her primary concern today is with fatigue.  She does not feel well rested upon waking.  She notices some irregularity in her heart beat but seems to be mostly maintaining sinus rhythm.  Today, she denies symptoms of orthopnea, PND, lower extremity edema, dizziness, presyncope, syncope, or neurologic sequela.  The patient feels that she is tolerating medications without difficulties and is otherwise without complaint today.   Past Medical History  Diagnosis Date  . Asthma   . Hyperlipemia   . Obesity   . Osteopenia   . Hypothyroidism     past hx  . Depression   . Atrial fibrillation     a. admx with AFib with RVR and acute systolic CHF 07/2013; TEE with LAA clot and no DCCV done;    . NICM (nonischemic cardiomyopathy)     a. Echo (07/17/13):  EF 25-30%, diff HK, basal and mid anteroseptum/ basal inferoseptum and apical AK, trivial AI, mod MR, mod LAE, mild reduced RVSF, mild RAE  . Chronic systolic heart failure   . Coronary artery disease     a.  LHC (07/17/13):  LM 20%, ostial CFX 20-30%  . Nephrolithiasis 2013    "passed them"   Past Surgical History  Procedure Laterality Date  . Appendectomy  1978  . Abdominal hysterectomy  ~ 2001  . Tee without cardioversion N/A 07/18/2013    Procedure: TRANSESOPHAGEAL ECHOCARDIOGRAM (TEE);  Surgeon: Lewayne Bunting, MD;  Location: [facility] ENDOSCOPY;  Service: Cardiovascular;  Laterality: N/A;  . Tee without cardioversion N/A 08/18/2013    Procedure: TRANSESOPHAGEAL ECHOCARDIOGRAM (TEE);  Surgeon: Donato Schultz, MD;  Location: [facility] ENDOSCOPY;  Service: Cardiovascular;  Laterality: N/A;  . Cardioversion N/A 08/18/2013    Procedure: CARDIOVERSION;  Surgeon: Donato Schultz, MD;  Location: [facility] ENDOSCOPY;  Service: Cardiovascular;  Laterality: N/A;  . Cardiac catheterization  07/2013  . Cardioversion N/A 08/29/2013    Procedure: CARDIOVERSION;  Surgeon: Pricilla Riffle, MD;   Location: [facility] OR;  Service: Cardiovascular;  Laterality: N/A;  . Tee without cardioversion N/A 10/27/2013    Procedure: TRANSESOPHAGEAL ECHOCARDIOGRAM (TEE);  Surgeon: Pricilla Riffle, MD;  Location: [facility] ENDOSCOPY;  Service: Cardiovascular;  Laterality: N/A;  . Ablation  10/28/13    PVI by Dr Johney Frame  . Left heart catheterization with coronary angiogram N/A 07/17/2013    Procedure: LEFT HEART CATHETERIZATION WITH CORONARY ANGIOGRAM;  Surgeon: Micheline Chapman, MD;  Location: [facility] CATH LAB;  Service: Cardiovascular;  Laterality: N/A;  . Atrial fibrillation ablation N/A 10/28/2013    Procedure: ATRIAL FIBRILLATION ABLATION;  Surgeon: Gardiner Rhyme, MD;  Location: [facility] CATH LAB;  Service: Cardiovascular;  Laterality: N/A;    Current Outpatient Prescriptions  Medication Sig Dispense Refill  . acetaminophen (TYLENOL) 325 MG tablet Take 650-975 mg by mouth every 6 (six) hours as needed for mild pain.     Marland Kitchen apixaban (ELIQUIS) 5 MG TABS tablet Take 5 mg by mouth 2 (two) times daily.    Marland Kitchen atorvastatin (LIPITOR) 40 MG tablet Take 40 mg by mouth daily at 6 PM.    . furosemide (LASIX) 20 MG tablet Take 20 mg by mouth daily.    Marland Kitchen lisinopril (PRINIVIL,ZESTRIL) 5 MG tablet Take 5 mg by mouth daily.    . metoprolol tartrate (LOPRESSOR) 25 MG tablet Take 0.5 tablets (12.5 mg total) by mouth as needed.    . potassium chloride (K-DUR,KLOR-CON) 10 MEQ tablet Take 10 mEq  by mouth daily.    . sertraline (ZOLOFT) 100 MG tablet Take 200 mg by mouth every morning.      No current facility-administered medications for this visit.    Allergies  Allergen Reactions  . Azithromycin Diarrhea and Nausea And Vomiting    History   Social History  . Marital Status: Married    Spouse Name: N/A  . Number of Children: N/A  . Years of Education: N/A   Occupational History  . Not on file.   Social History Main Topics  . Smoking status: Never Smoker   . Smokeless tobacco: Never Used  . Alcohol Use: 0.6 oz/week    1 Cans of  beer per week  . Drug Use: No  . Sexual Activity: Yes   Other Topics Concern  . Not on file   Social History Narrative    Family History  Problem Relation Age of Onset  . Hypertension Mother   . Hypertension Father   . Lymphoma Mother     ROS-  All systems are reviewed and are negative except as outlined in the HPI above  Physical Exam: Filed Vitals:   04/13/14 1403  BP: 108/70  Pulse: 49  Height:  (1.702 m)  Weight: 198 lb 9.6 oz (90.084 kg)    GEN- The patient is well appearing, alert and oriented x 3 today.   Head- normocephalic, atraumatic Eyes-  Sclera clear, conjunctiva pink Ears- hearing intact Oropharynx- clear Neck- supple,   Lungs- clear, normal work of breathing Heart- Regular rate and rhythm, no murmurs, rubs or gallops, PMI not laterally displaced GI- soft, NT, ND, + BS Extremities- no clubbing, cyanosis, or edema MS- no significant deformity or atrophy Skin- no rash or lesion Psych- euthymic mood, full affect Neuro- strength and sensation are intact  ekg today reveals sinus bradycardia  Assessment and Plan:  1. afib Doing well off of AAD therapy Continue anticoagulation for now To better evaluate her palpitations, I have encouraged implantable loop recorder placement.  At this time, she would like to avoid this.  2. Nonischemic CM/ acute on chronic systolic dysfunction EF has recovered 2 gram sodium restriction She takes lasix M,W,F only at this time  3. Fatigue I would like for Dr Renne Crigler to order a sleep study I will also switch metoprolol to prn  Return in 3 months

## 2014-04-13 NOTE — Patient Instructions (Signed)
Your physician recommends that you schedule a follow-up appointment in: 3 months with Dr. Johney Frame  Your physician has recommended you make the following change in your medication:  1)stop protonix 2) change metoprolol to as needed only

## 2014-05-27 ENCOUNTER — Telehealth: Payer: Self-pay | Admitting: Internal Medicine

## 2014-05-27 NOTE — Telephone Encounter (Signed)
WAS DISCUSSED WITH DR Johney Frame. PT WILL SEE DONNA CARROLL, NP AT AFIB CLINIC TOMORROW AT 2:30 PM

## 2014-05-27 NOTE — Telephone Encounter (Signed)
New Message  Pt wanted to speak w/ rn about recent episodes of 'heart racing'- pt thinks she is in A-fib, some CP. Please call back and discuss.

## 2014-05-27 NOTE — Telephone Encounter (Signed)
approx week and half ago --rapid irregular heartbeat, 130s-140s making her feel awful. Has happened every day since. No energy, headache Still taking Eliquis. Taking metoprolol 1/2 tab about twice daily--slows some to about 90 but still irregular, still feeling bad. Thought she had upcoming appt with Dr. Johney Frame this week but does not.   Currently BP 100/68, HR 100--took 12.5 metoprolol 45 min ago.  Considering having loop recorder.  She is aware I will discuss with Dr. Johney Frame and will call her back with any new recommendations.

## 2014-05-28 ENCOUNTER — Encounter (HOSPITAL_COMMUNITY): Payer: Self-pay | Admitting: Nurse Practitioner

## 2014-05-28 ENCOUNTER — Ambulatory Visit (HOSPITAL_COMMUNITY)
Admission: RE | Admit: 2014-05-28 | Discharge: 2014-05-28 | Disposition: A | Discharge status: Home / Self Care | Payer: BLUE CROSS/BLUE SHIELD | Source: Ambulatory Visit | Attending: Nurse Practitioner | Admitting: Nurse Practitioner

## 2014-05-28 ENCOUNTER — Ambulatory Visit: Payer: Self-pay | Admitting: Internal Medicine

## 2014-05-28 VITALS — BP 138/96 | HR 52 | Ht 67.0 in | Wt 195.8 lb

## 2014-05-28 DIAGNOSIS — I429 Cardiomyopathy, unspecified: Secondary | ICD-10-CM | POA: Diagnosis not present

## 2014-05-28 DIAGNOSIS — I481 Persistent atrial fibrillation: Secondary | ICD-10-CM | POA: Diagnosis present

## 2014-05-28 DIAGNOSIS — I4819 Other persistent atrial fibrillation: Secondary | ICD-10-CM

## 2014-05-28 DIAGNOSIS — I5023 Acute on chronic systolic (congestive) heart failure: Secondary | ICD-10-CM | POA: Insufficient documentation

## 2014-05-28 NOTE — Patient Instructions (Signed)
Your physician recommends that you schedule a follow-up appointment in: device clinic May 4th  Linq implant scheduled for April 22nd @ 730am. Arrive at [facility] and go to admitting.

## 2014-05-28 NOTE — Progress Notes (Signed)
PCP:  Londell Moh, MD  The patient presents today to afib clinic for complaints of increase in irregular heart beat and fluid retention. She did well for a period of time following ablation(9/15), but over the last several weeks symptoms have worsened. Last saturday she was aware of shortness of breath, chest pressure and aware that her ankles were very swollen. She started back on prn BB and diuretic and today is the first day that she has felt back to baseline. EKG shows s.brady. Does not exercise due to elevation of heart rate afterward,which can be sustained to 140-150 for a day. She denies snoring/alcohol history. She failed tikosyn in the past and took amiodarone for a short period of time, stopped post ablation. Dr. Johney Frame has dicussed LINQ with her in the past and she would like to proceed with this procedure to help manage arrythmia.  Today, she denies symptoms of orthopnea, PND, lower extremity edema, dizziness, presyncope, syncope, or neurologic sequela.  The patient feels that she is tolerating medications without difficulties and is otherwise without complaint today.   Past Medical History  Diagnosis Date  . Asthma   . Hyperlipemia   . Obesity   . Osteopenia   . Hypothyroidism     past hx  . Depression   . Atrial fibrillation     a. admx with AFib with RVR and acute systolic CHF 07/2013; TEE with LAA clot and no DCCV done;    . NICM (nonischemic cardiomyopathy)     a. Echo (07/17/13):  EF 25-30%, diff HK, basal and mid anteroseptum/ basal inferoseptum and apical AK, trivial AI, mod MR, mod LAE, mild reduced RVSF, mild RAE  . Chronic systolic heart failure   . Coronary artery disease     a.  LHC (07/17/13):  LM 20%, ostial CFX 20-30%  . Nephrolithiasis 2013    "passed them"   Past Surgical History  Procedure Laterality Date  . Appendectomy  1978  . Abdominal hysterectomy  ~ 2001  . Tee without cardioversion N/A 07/18/2013    Procedure: TRANSESOPHAGEAL  ECHOCARDIOGRAM (TEE);  Surgeon: Lewayne Bunting, MD;  Location: [facility] ENDOSCOPY;  Service: Cardiovascular;  Laterality: N/A;  . Tee without cardioversion N/A 08/18/2013    Procedure: TRANSESOPHAGEAL ECHOCARDIOGRAM (TEE);  Surgeon: Donato Schultz, MD;  Location: [facility] ENDOSCOPY;  Service: Cardiovascular;  Laterality: N/A;  . Cardioversion N/A 08/18/2013    Procedure: CARDIOVERSION;  Surgeon: Donato Schultz, MD;  Location: [facility] ENDOSCOPY;  Service: Cardiovascular;  Laterality: N/A;  . Cardiac catheterization  07/2013  . Cardioversion N/A 08/29/2013    Procedure: CARDIOVERSION;  Surgeon: Pricilla Riffle, MD;  Location: [facility] OR;  Service: Cardiovascular;  Laterality: N/A;  . Tee without cardioversion N/A 10/27/2013    Procedure: TRANSESOPHAGEAL ECHOCARDIOGRAM (TEE);  Surgeon: Pricilla Riffle, MD;  Location: [facility] ENDOSCOPY;  Service: Cardiovascular;  Laterality: N/A;  . Ablation  10/28/13    PVI by Dr Johney Frame  . Left heart catheterization with coronary angiogram N/A 07/17/2013    Procedure: LEFT HEART CATHETERIZATION WITH CORONARY ANGIOGRAM;  Surgeon: Micheline Chapman, MD;  Location: [facility] CATH LAB;  Service: Cardiovascular;  Laterality: N/A;  . Atrial fibrillation ablation N/A 10/28/2013    Procedure: ATRIAL FIBRILLATION ABLATION;  Surgeon: Gardiner Rhyme, MD;  Location: [facility] CATH LAB;  Service: Cardiovascular;  Laterality: N/A;    Current Outpatient Prescriptions  Medication Sig Dispense Refill  . acetaminophen (TYLENOL) 325 MG tablet Take 650-975 mg by mouth every 6 (six)  hours as needed for mild pain.     Marland Kitchen apixaban (ELIQUIS) 5 MG TABS tablet Take 5 mg by mouth 2 (two) times daily.    Marland Kitchen atorvastatin (LIPITOR) 40 MG tablet Take 40 mg by mouth daily at 6 PM.    . furosemide (LASIX) 20 MG tablet Take 20 mg by mouth daily as needed for fluid.     Marland Kitchen lisinopril (PRINIVIL,ZESTRIL) 5 MG tablet Take 5 mg by mouth daily.    . metoprolol tartrate (LOPRESSOR) 25 MG tablet Take 0.5 tablets (12.5 mg total) by mouth as needed.    . potassium  chloride (K-DUR,KLOR-CON) 10 MEQ tablet Take 10 mEq by mouth daily as needed (when taking lasix).     Marland Kitchen sertraline (ZOLOFT) 100 MG tablet Take 200 mg by mouth every morning.      No current facility-administered medications for this encounter.    Allergies  Allergen Reactions  . Azithromycin Diarrhea and Nausea And Vomiting    History   Social History  . Marital Status: Married    Spouse Name: N/A  . Number of Children: N/A  . Years of Education: N/A   Occupational History  . Not on file.   Social History Main Topics  . Smoking status: Never Smoker   . Smokeless tobacco: Never Used  . Alcohol Use: 0.6 oz/week    1 Cans of beer per week  . Drug Use: No  . Sexual Activity: Yes   Other Topics Concern  . Not on file   Social History Narrative    Family History  Problem Relation Age of Onset  . Hypertension Mother   . Hypertension Father   . Lymphoma Mother     ROS-  All systems are reviewed and are negative except as outlined in the HPI above  Physical Exam: Filed Vitals:   05/28/14 1441  BP: 138/96  Pulse: 52  Height: 5\' 7"  (1.702 m)  Weight: 195 lb 12.8 oz (88.814 kg)    GEN- The patient is well appearing, alert and oriented x 3 today.   Head- normocephalic, atraumatic Eyes-  Sclera clear, conjunctiva pink Ears- hearing intact Oropharynx- clear Neck- supple,   Lungs- clear, normal work of breathing Heart- Regular rate and rhythm, no murmurs, rubs or gallops, PMI not laterally displaced GI- soft, NT, ND, + BS Extremities- no clubbing, cyanosis, or edema MS- no significant deformity or atrophy Skin- no rash or lesion Psych- euthymic mood, full affect Neuro- strength and sensation are intact  ekg today reveals sinus bradycardia 52 bpm.  Assessment and Plan:  1. Persistent symptomatic afib Take BB as needed but with heart rate is the 40's if takes moe than 12.5 mg a day. Pt would like to proceed with LINQ to further help manage symptoms, pending  4/22.   2. Nonischemic CM/ acute on chronic systolic dysfunction EF has recovered 2 gram sodium restriction She takes lasix as needed Weigh daily to stay ahead of fluid  F/u with Dr. Johney Frame s/p linq   Return in 3 monthsPatient ID: Kiara [name], female   DOB: 27-Oct-1960, 54 y.o.   MRN: [id number]

## 2014-05-30 NOTE — Addendum Note (Signed)
Encounter addended by: Ihor Gully, NT on: 05/30/2014 11:48 AM<BR>     Documentation filed: Charges VN

## 2014-06-02 ENCOUNTER — Telehealth: Payer: Self-pay | Admitting: Internal Medicine

## 2014-06-02 NOTE — Telephone Encounter (Signed)
I spoke with the pt and she does not know for sure if she fractured her ribs but she did hear and feel a pop. I advised the pt that she can take extra strength tylenol for discomfort.  I made her aware that she is correct about trying to avoid NSAIDS but can take a short term (3 days or less) course of Aleve as needed for pain.  The pt will also apply cold compresses to her chest area.

## 2014-06-02 NOTE — Telephone Encounter (Signed)
New problem   Pt has fractured her ribs and was told because of her heart condition she couldn't take Motrin or Aleve, pt want to speak to nurse to see what she can take or if something can be called in. Please advise.

## 2014-06-05 ENCOUNTER — Encounter (HOSPITAL_COMMUNITY): Payer: Self-pay | Admitting: Internal Medicine

## 2014-06-05 ENCOUNTER — Encounter (HOSPITAL_COMMUNITY)
Admission: RE | Disposition: A | Discharge status: Home / Self Care | Payer: Self-pay | Source: Ambulatory Visit | Attending: Internal Medicine

## 2014-06-05 ENCOUNTER — Other Ambulatory Visit: Payer: Self-pay | Admitting: Nurse Practitioner

## 2014-06-05 ENCOUNTER — Ambulatory Visit (HOSPITAL_COMMUNITY)
Admission: RE | Admit: 2014-06-05 | Discharge: 2014-06-05 | Disposition: A | Discharge status: Home / Self Care | Payer: BLUE CROSS/BLUE SHIELD | Source: Ambulatory Visit | Attending: Internal Medicine | Admitting: Internal Medicine

## 2014-06-05 DIAGNOSIS — Z90710 Acquired absence of both cervix and uterus: Secondary | ICD-10-CM | POA: Diagnosis not present

## 2014-06-05 DIAGNOSIS — J45909 Unspecified asthma, uncomplicated: Secondary | ICD-10-CM | POA: Diagnosis not present

## 2014-06-05 DIAGNOSIS — Z6830 Body mass index (BMI) 30.0-30.9, adult: Secondary | ICD-10-CM | POA: Insufficient documentation

## 2014-06-05 DIAGNOSIS — E785 Hyperlipidemia, unspecified: Secondary | ICD-10-CM | POA: Diagnosis not present

## 2014-06-05 DIAGNOSIS — I2510 Atherosclerotic heart disease of native coronary artery without angina pectoris: Secondary | ICD-10-CM | POA: Insufficient documentation

## 2014-06-05 DIAGNOSIS — I429 Cardiomyopathy, unspecified: Secondary | ICD-10-CM | POA: Diagnosis not present

## 2014-06-05 DIAGNOSIS — I5022 Chronic systolic (congestive) heart failure: Secondary | ICD-10-CM | POA: Insufficient documentation

## 2014-06-05 DIAGNOSIS — E669 Obesity, unspecified: Secondary | ICD-10-CM | POA: Insufficient documentation

## 2014-06-05 DIAGNOSIS — I481 Persistent atrial fibrillation: Secondary | ICD-10-CM | POA: Insufficient documentation

## 2014-06-05 DIAGNOSIS — Z79899 Other long term (current) drug therapy: Secondary | ICD-10-CM | POA: Diagnosis not present

## 2014-06-05 DIAGNOSIS — F329 Major depressive disorder, single episode, unspecified: Secondary | ICD-10-CM | POA: Insufficient documentation

## 2014-06-05 DIAGNOSIS — Z7901 Long term (current) use of anticoagulants: Secondary | ICD-10-CM | POA: Diagnosis not present

## 2014-06-05 DIAGNOSIS — I4891 Unspecified atrial fibrillation: Secondary | ICD-10-CM | POA: Diagnosis not present

## 2014-06-05 DIAGNOSIS — E039 Hypothyroidism, unspecified: Secondary | ICD-10-CM | POA: Insufficient documentation

## 2014-06-05 DIAGNOSIS — M8580 Other specified disorders of bone density and structure, unspecified site: Secondary | ICD-10-CM | POA: Insufficient documentation

## 2014-06-05 HISTORY — PX: LOOP RECORDER IMPLANT: SHX5477

## 2014-06-05 SURGERY — LOOP RECORDER IMPLANT
Anesthesia: LOCAL

## 2014-06-05 MED ORDER — LIDOCAINE-EPINEPHRINE 1 %-1:100000 IJ SOLN
INTRAMUSCULAR | Status: AC
  Filled 2014-06-05: qty 1

## 2014-06-05 NOTE — H&P (View-Only) (Signed)
     PCP:  PHARR,WALTER DAVIDSON, MD  The patient presents today to afib clinic for complaints of increase in irregular heart beat and fluid retention. She did well for a period of time following ablation(9/15), but over the last several weeks symptoms have worsened. Last saturday she was aware of shortness of breath, chest pressure and aware that her ankles were very swollen. She started back on prn BB and diuretic and today is the first day that she has felt back to baseline. EKG shows s.brady. Does not exercise due to elevation of heart rate afterward,which can be sustained to 140-150 for a day. She denies snoring/alcohol history. She failed tikosyn in the past and took amiodarone for a short period of time, stopped post ablation. Dr. Allred has dicussed LINQ with her in the past and she would like to proceed with this procedure to help manage arrythmia.  Today, she denies symptoms of orthopnea, PND, lower extremity edema, dizziness, presyncope, syncope, or neurologic sequela.  The patient feels that she is tolerating medications without difficulties and is otherwise without complaint today.   Past Medical History  Diagnosis Date  . Asthma   . Hyperlipemia   . Obesity   . Osteopenia   . Hypothyroidism     past hx  . Depression   . Atrial fibrillation     a. admx with AFib with RVR and acute systolic CHF 07/2013; TEE with LAA clot and no DCCV done;    . NICM (nonischemic cardiomyopathy)     a. Echo (07/17/13):  EF 25-30%, diff HK, basal and mid anteroseptum/ basal inferoseptum and apical AK, trivial AI, mod MR, mod LAE, mild reduced RVSF, mild RAE  . Chronic systolic heart failure   . Coronary artery disease     a.  LHC (07/17/13):  LM 20%, ostial CFX 20-30%  . Nephrolithiasis 2013    "passed them"   Past Surgical History  Procedure Laterality Date  . Appendectomy  1978  . Abdominal hysterectomy  ~ 2001  . Tee without cardioversion N/A 07/18/2013    Procedure: TRANSESOPHAGEAL  ECHOCARDIOGRAM (TEE);  Surgeon: Brian S Crenshaw, MD;  Location: [facility] ENDOSCOPY;  Service: Cardiovascular;  Laterality: N/A;  . Tee without cardioversion N/A 08/18/2013    Procedure: TRANSESOPHAGEAL ECHOCARDIOGRAM (TEE);  Surgeon: Mark Skains, MD;  Location: [facility] ENDOSCOPY;  Service: Cardiovascular;  Laterality: N/A;  . Cardioversion N/A 08/18/2013    Procedure: CARDIOVERSION;  Surgeon: Mark Skains, MD;  Location: [facility] ENDOSCOPY;  Service: Cardiovascular;  Laterality: N/A;  . Cardiac catheterization  07/2013  . Cardioversion N/A 08/29/2013    Procedure: CARDIOVERSION;  Surgeon: Paula Ross V, MD;  Location: [facility] OR;  Service: Cardiovascular;  Laterality: N/A;  . Tee without cardioversion N/A 10/27/2013    Procedure: TRANSESOPHAGEAL ECHOCARDIOGRAM (TEE);  Surgeon: Paula Ross V, MD;  Location: [facility] ENDOSCOPY;  Service: Cardiovascular;  Laterality: N/A;  . Ablation  10/28/13    PVI by Dr Allred  . Left heart catheterization with coronary angiogram N/A 07/17/2013    Procedure: LEFT HEART CATHETERIZATION WITH CORONARY ANGIOGRAM;  Surgeon: Michael D Cooper, MD;  Location: [facility] CATH LAB;  Service: Cardiovascular;  Laterality: N/A;  . Atrial fibrillation ablation N/A 10/28/2013    Procedure: ATRIAL FIBRILLATION ABLATION;  Surgeon: James D Allred, MD;  Location: [facility] CATH LAB;  Service: Cardiovascular;  Laterality: N/A;    Current Outpatient Prescriptions  Medication Sig Dispense Refill  . acetaminophen (TYLENOL) 325 MG tablet Take 650-975 mg by mouth every 6 (six)   hours as needed for mild pain.     . apixaban (ELIQUIS) 5 MG TABS tablet Take 5 mg by mouth 2 (two) times daily.    . atorvastatin (LIPITOR) 40 MG tablet Take 40 mg by mouth daily at 6 PM.    . furosemide (LASIX) 20 MG tablet Take 20 mg by mouth daily as needed for fluid.     . lisinopril (PRINIVIL,ZESTRIL) 5 MG tablet Take 5 mg by mouth daily.    . metoprolol tartrate (LOPRESSOR) 25 MG tablet Take 0.5 tablets (12.5 mg total) by mouth as needed.    . potassium  chloride (K-DUR,KLOR-CON) 10 MEQ tablet Take 10 mEq by mouth daily as needed (when taking lasix).     . sertraline (ZOLOFT) 100 MG tablet Take 200 mg by mouth every morning.      No current facility-administered medications for this encounter.    Allergies  Allergen Reactions  . Azithromycin Diarrhea and Nausea And Vomiting    History   Social History  . Marital Status: Married    Spouse Name: N/A  . Number of Children: N/A  . Years of Education: N/A   Occupational History  . Not on file.   Social History Main Topics  . Smoking status: Never Smoker   . Smokeless tobacco: Never Used  . Alcohol Use: 0.6 oz/week    1 Cans of beer per week  . Drug Use: No  . Sexual Activity: Yes   Other Topics Concern  . Not on file   Social History Narrative    Family History  Problem Relation Age of Onset  . Hypertension Mother   . Hypertension Father   . Lymphoma Mother     ROS-  All systems are reviewed and are negative except as outlined in the HPI above  Physical Exam: Filed Vitals:   05/28/14 1441  BP: 138/96  Pulse: 52  Height: 5' 7" (1.702 m)  Weight: 195 lb 12.8 oz (88.814 kg)    GEN- The patient is well appearing, alert and oriented x 3 today.   Head- normocephalic, atraumatic Eyes-  Sclera clear, conjunctiva pink Ears- hearing intact Oropharynx- clear Neck- supple,   Lungs- clear, normal work of breathing Heart- Regular rate and rhythm, no murmurs, rubs or gallops, PMI not laterally displaced GI- soft, NT, ND, + BS Extremities- no clubbing, cyanosis, or edema MS- no significant deformity or atrophy Skin- no rash or lesion Psych- euthymic mood, full affect Neuro- strength and sensation are intact  ekg today reveals sinus bradycardia 52 bpm.  Assessment and Plan:  1. Persistent symptomatic afib Take BB as needed but with heart rate is the 40's if takes moe than 12.5 mg a day. Pt would like to proceed with LINQ to further help manage symptoms, pending  4/22.   2. Nonischemic CM/ acute on chronic systolic dysfunction EF has recovered 2 gram sodium restriction She takes lasix as needed Weigh daily to stay ahead of fluid  F/u with Dr. Allred s/p linq   Return in 3 monthsPatient ID: Kiara [name], female   DOB: 06/15/1960, 53 y.o.   MRN: [id number]  

## 2014-06-05 NOTE — Procedures (Signed)
SURGEON:  Hillis Range, MD     PREPROCEDURE DIAGNOSIS:  Atrial fibrillation, palpitations post ablation    POSTPROCEDURE DIAGNOSIS:  Atrial fibrillation, palpitations post ablation     PROCEDURES:   1. Implantable loop recorder implantation    INTRODUCTION:  Kiara [name] is a 54 y.o. female with a history of persistent atrial fibrillation.  She is s/p atrial fibrillation ablation.  She did very well initially following ablation.  Unfortunately, she has begun having tachypalpitations.  The patient therefore presents today for implantable loop implantation for atrial fibrillation management post ablation and for further evaluation of her palpitations.     DESCRIPTION OF PROCEDURE:  Informed written consent was obtained, and the patient was brought to the electrophysiology lab in a fasting state.  The patient required no sedation for the procedure today.  Mapping over the patient's chest was performed by the EP lab staff to identify the area where electrograms were most prominent for ILR recording.  This area was found to be the left parasternal region over the 3rd-4th intercostal space. The patients left chest was therefore prepped and draped in the usual sterile fashion by the EP lab staff. The skin overlying the left parasternal region was infiltrated with lidocaine for local analgesia.  A 0.5-cm incision was made over the left parasternal region over the 3rd intercostal space.  A subcutaneous ILR pocket was fashioned using a combination of sharp and blunt dissection.  A Medtronic Reveal [place] model [id number] implantable loop recorder was then placed into the pocket  R waves were very prominent and measured 0.55mV. EBL<1 ml.  Steri- Strips and a sterile dressing were then applied.  There were no early apparent complications.     CONCLUSIONS:   1. Successful implantation of a Medtronic Reveal LINQ implantable loop recorder for afib management post ablation and for further evaluation of  palpitations  2. No early apparent complications.   Hillis Range MD, [facility] 06/05/2014 8:31 AM

## 2014-06-05 NOTE — Interval H&P Note (Signed)
History and Physical Interval Note:  06/05/2014 7:23 AM  Kiara [name]  has presented today for surgery, with the diagnosis of afib  The various methods of treatment have been discussed with the patient and family. After consideration of risks, benefits and other options for treatment, the patient has consented to  Procedure(s): LOOP RECORDER IMPLANT (N/A) as a surgical intervention .  The patient's history has been reviewed, patient examined, no change in status, stable for surgery.  I have reviewed the patient's chart and labs.  Questions were answered to the patient's satisfaction.      Hillis Range

## 2014-06-15 ENCOUNTER — Ambulatory Visit: Payer: BLUE CROSS/BLUE SHIELD | Admitting: Internal Medicine

## 2014-06-17 ENCOUNTER — Ambulatory Visit (INDEPENDENT_AMBULATORY_CARE_PROVIDER_SITE_OTHER): Payer: BLUE CROSS/BLUE SHIELD | Admitting: *Deleted

## 2014-06-17 ENCOUNTER — Encounter: Payer: Self-pay | Admitting: Internal Medicine

## 2014-06-17 DIAGNOSIS — I481 Persistent atrial fibrillation: Secondary | ICD-10-CM

## 2014-06-17 DIAGNOSIS — I4819 Other persistent atrial fibrillation: Secondary | ICD-10-CM

## 2014-06-17 LAB — CUP PACEART INCLINIC DEVICE CHECK
Date Time Interrogation Session: 20160504150844
Zone Setting Detection Interval: 2000 ms
Zone Setting Detection Interval: 340 ms
Zone Setting Detection Interval: 4500 ms

## 2014-06-17 NOTE — Progress Notes (Signed)
Wound check in clinic s/p ILR implant.  Steri strips removed. Wound well healed without redness or edema. Pt with 0 tachy episodes; 0 brady episodes; 0 asystole; 0 symptom episodes; 23 AF episodes (41.4%)---max dur. 24 hrs 2 mins, Max Avg V 150bpm + Eliquis---V. rate >=100bpm ~57% during AF. Episodes were appropriate.  Plan to continue [facility] f/u QMO and f/u w/ JA on 6/6 @ 1345.

## 2014-06-26 ENCOUNTER — Encounter: Payer: Self-pay | Admitting: Internal Medicine

## 2014-06-30 ENCOUNTER — Encounter: Payer: Self-pay | Admitting: Internal Medicine

## 2014-07-06 ENCOUNTER — Encounter: Payer: Self-pay | Admitting: Internal Medicine

## 2014-07-06 ENCOUNTER — Ambulatory Visit (INDEPENDENT_AMBULATORY_CARE_PROVIDER_SITE_OTHER): Payer: BLUE CROSS/BLUE SHIELD | Admitting: *Deleted

## 2014-07-06 ENCOUNTER — Ambulatory Visit (INDEPENDENT_AMBULATORY_CARE_PROVIDER_SITE_OTHER): Payer: BLUE CROSS/BLUE SHIELD | Admitting: Internal Medicine

## 2014-07-06 VITALS — BP 110/70 | HR 56 | Ht 67.0 in | Wt 191.6 lb

## 2014-07-06 DIAGNOSIS — I428 Other cardiomyopathies: Secondary | ICD-10-CM

## 2014-07-06 DIAGNOSIS — I4819 Other persistent atrial fibrillation: Secondary | ICD-10-CM

## 2014-07-06 DIAGNOSIS — I429 Cardiomyopathy, unspecified: Secondary | ICD-10-CM | POA: Diagnosis not present

## 2014-07-06 DIAGNOSIS — I481 Persistent atrial fibrillation: Secondary | ICD-10-CM

## 2014-07-06 DIAGNOSIS — I4891 Unspecified atrial fibrillation: Secondary | ICD-10-CM | POA: Diagnosis not present

## 2014-07-06 LAB — CUP PACEART INCLINIC DEVICE CHECK
Date Time Interrogation Session: 20160523145850
Zone Setting Detection Interval: 2000 ms
Zone Setting Detection Interval: 340 ms
Zone Setting Detection Interval: 4500 ms

## 2014-07-06 MED ORDER — DILTIAZEM HCL ER COATED BEADS 120 MG PO CP24: 120.0000 mg | ORAL_CAPSULE | Freq: Every day | ORAL | Status: DC

## 2014-07-06 MED ORDER — FLECAINIDE ACETATE 50 MG PO TABS: 50.0000 mg | ORAL_TABLET | Freq: Two times a day (BID) | ORAL | Status: DC

## 2014-07-06 NOTE — Progress Notes (Signed)
Electrophysiology Office Note   Date:  07/06/2014   ID:  Kiara [name], Kiara [name] 1960-04-01, MRN [id number]  PCP:  Londell Moh, MD   Primary Electrophysiologist: Hillis Range, MD    Chief Complaint  Patient presents with  . Persistent AFIB     History of Present Illness: Kiara [name] is a 54 y.o. female who presents today for electrophysiology evaluation.   Doing reasonably well.  Continues to have some afib (19%).  Unaware of triggers though episodes are more frequent at night.  Denies symptoms of OSA.   Today, she denies symptoms of palpitations, chest pain, shortness of breath, orthopnea, PND, lower extremity edema, claudication, dizziness, presyncope, syncope, bleeding, or neurologic sequela. The patient is tolerating medications without difficulties and is otherwise without complaint today.    Past Medical History  Diagnosis Date  . Asthma   . Hyperlipemia   . Obesity   . Osteopenia   . Hypothyroidism     past hx  . Depression   . Atrial fibrillation     a. admx with AFib with RVR and acute systolic CHF 07/2013; TEE with LAA clot and no DCCV done;    . NICM (nonischemic cardiomyopathy)     a. Echo (07/17/13):  EF 25-30%, diff HK, basal and mid anteroseptum/ basal inferoseptum and apical AK, trivial AI, mod MR, mod LAE, mild reduced RVSF, mild RAE  . Chronic systolic heart failure   . Coronary artery disease     a.  LHC (07/17/13):  LM 20%, ostial CFX 20-30%  . Nephrolithiasis 2013    "passed them"   Past Surgical History  Procedure Laterality Date  . Appendectomy  1978  . Abdominal hysterectomy  ~ 2001  . Tee without cardioversion N/A 07/18/2013    Procedure: TRANSESOPHAGEAL ECHOCARDIOGRAM (TEE);  Surgeon: Lewayne Bunting, MD;  Location: [facility] ENDOSCOPY;  Service: Cardiovascular;  Laterality: N/A;  . Tee without cardioversion N/A 08/18/2013    Procedure: TRANSESOPHAGEAL ECHOCARDIOGRAM (TEE);  Surgeon: Donato Schultz, MD;  Location: [facility] ENDOSCOPY;  Service:  Cardiovascular;  Laterality: N/A;  . Cardioversion N/A 08/18/2013    Procedure: CARDIOVERSION;  Surgeon: Donato Schultz, MD;  Location: [facility] ENDOSCOPY;  Service: Cardiovascular;  Laterality: N/A;  . Cardiac catheterization  07/2013  . Cardioversion N/A 08/29/2013    Procedure: CARDIOVERSION;  Surgeon: Pricilla Riffle, MD;  Location: [facility] OR;  Service: Cardiovascular;  Laterality: N/A;  . Tee without cardioversion N/A 10/27/2013    Procedure: TRANSESOPHAGEAL ECHOCARDIOGRAM (TEE);  Surgeon: Pricilla Riffle, MD;  Location: [facility] ENDOSCOPY;  Service: Cardiovascular;  Laterality: N/A;  . Ablation  10/28/13    PVI by Dr Johney Frame  . Left heart catheterization with coronary angiogram N/A 07/17/2013    Procedure: LEFT HEART CATHETERIZATION WITH CORONARY ANGIOGRAM;  Surgeon: Micheline Chapman, MD;  Location: [facility] CATH LAB;  Service: Cardiovascular;  Laterality: N/A;  . Atrial fibrillation ablation N/A 10/28/2013    Procedure: ATRIAL FIBRILLATION ABLATION;  Surgeon: Gardiner Rhyme, MD;  Location: [facility] CATH LAB;  Service: Cardiovascular;  Laterality: N/A;  . Loop recorder implant N/A 06/05/2014    Procedure: LOOP RECORDER IMPLANT;  Surgeon: Hillis Range, MD;  Location: [facility] CATH LAB;  Service: Cardiovascular;  Laterality: N/A;     Current Outpatient Prescriptions  Medication Sig Dispense Refill  . acetaminophen (TYLENOL) 325 MG tablet Take 650-975 mg by mouth every 6 (six) hours as needed for mild pain.     Marland Kitchen apixaban (ELIQUIS) 5 MG TABS tablet Take 5 mg by  mouth 2 (two) times daily.    Marland Kitchen atorvastatin (LIPITOR) 40 MG tablet Take 40 mg by mouth daily at 6 PM.    . furosemide (LASIX) 20 MG tablet Take 20 mg by mouth every other day.     . lisinopril (PRINIVIL,ZESTRIL) 5 MG tablet Take 5 mg by mouth daily.    . metoprolol tartrate (LOPRESSOR) 25 MG tablet Take 0.5 tablets (12.5 mg total) by mouth as needed. (Patient taking differently: Take 12.5 mg by mouth daily as needed (Palpitations/AFIB). )    . potassium chloride (K-DUR,KLOR-CON) 10  MEQ tablet Take 10 mEq by mouth daily as needed (when taking lasix).     Marland Kitchen sertraline (ZOLOFT) 100 MG tablet Take 200 mg by mouth every morning.     . diltiazem (CARDIZEM CD) 120 MG 24 hr capsule Take 1 capsule (120 mg total) by mouth daily. 90 capsule 3  . flecainide (TAMBOCOR) 50 MG tablet Take 1 tablet (50 mg total) by mouth 2 (two) times daily. 180 tablet 3   No current facility-administered medications for this visit.    Allergies:   Azithromycin   Social History:  The patient  reports that she has never smoked. She has never used smokeless tobacco. She reports that she drinks about 0.6 oz of alcohol per week. She reports that she does not use illicit drugs.   Family History:  The patient's  family history includes Hypertension in her father and mother; Lymphoma in her mother.    ROS:  Please see the history of present illness.   All other systems are reviewed and negative.    PHYSICAL EXAM: VS:  BP 110/70 mmHg  Pulse 56  Ht  (1.702 m)  Wt 86.909 kg (191 lb 9.6 oz)  BMI 30.00 kg/m2 , BMI Body mass index is 30 kg/(m^2). GEN: Well nourished, well developed, in no acute distress HEENT: normal Neck: no JVD, carotid bruits, or masses Cardiac: RRR; no murmurs, rubs, or gallops,no edema  Respiratory:  clear to auscultation bilaterally, normal work of breathing GI: soft, nontender, nondistended, + BS MS: no deformity or atrophy Skin: warm and dry  Neuro:  Strength and sensation are intact Psych: euthymic mood, full affect  ILR interrogation today is reviewed- AF burden 19%, no other arrhythmias   Recent Labs: 07/16/2013: TSH 1.450 08/14/2013: Pro B Natriuretic peptide (BNP) 118.0* 08/29/2013: Magnesium 2.2 10/21/2013: Hemoglobin 13.1; Platelets 179.0 10/29/2013: BUN 10; Creatinine 0.83; Potassium 3.7; Sodium 140    Lipid Panel     Component Value Date/Time   CHOL 198 07/17/2013 0720   TRIG 69 07/17/2013 0720   HDL 59 07/17/2013 0720   CHOLHDL 3.4 07/17/2013 0720   VLDL  14 07/17/2013 0720   LDLCALC 125* 07/17/2013 0720     Wt Readings from Last 3 Encounters:  07/06/14 86.909 kg (191 lb 9.6 oz)  06/05/14 88.451 kg (195 lb)  05/28/14 88.814 kg (195 lb 12.8 oz)      ASSESSMENT AND PLAN:  1.  Persistent afib Now activing more paroxysmal s/p ablation.  Very difficult to control.  Previously failed medical therapy with tikosyn and amidoarone.  Therapeutic strategies for afib including medicine and ablation were discussed in detail with the patient today. Risk, benefits, and alternatives to EP study and radiofrequency ablation for afib were also discussed in detail today. These risks include but are not limited to stroke, bleeding, vascular damage, tamponade, perforation, damage to the esophagus, lungs, and other structures, pulmonary vein stenosis, worsening renal function, and death.  The patient understands these risk and wishes continue to contemplate this as an option.  For now, we will start diltiazem  daily due to RVR and also start flecainide  BID.   Return in 6 weeks Could consider ablation if her afib continues.  2. Nonischemic CM EF has previously normalized We should try to keep in sinus long term.   Current medicines are reviewed at length with the patient today.   The patient does not have concerns regarding her medicines.  The following changes were made today:  none  Labs/ tests ordered today include:  Orders Placed This Encounter  Procedures  . Implantable device check    Follow-up:   Signed, Hillis Range, MD  07/06/2014 10:34 PM     [facility] [address] [place] [address] [phone] (office) [phone] (fax)

## 2014-07-06 NOTE — Patient Instructions (Signed)
Medication Instructions:  Your physician has recommended you make the following change in your medication:  1) Start Diltiazem 120 mg daily 2) Start Flecainide 50 mg twice daily    Labwork: None ordered  Testing/Procedures: None ordered  Follow-Up: Your physician wants you to follow-up in: 6 weeks with Dr Johney Frame    Any Other Special Instructions Will Be Listed Below (If Applicable).

## 2014-07-10 NOTE — Progress Notes (Signed)
Loop recorder 

## 2014-07-14 LAB — CUP PACEART REMOTE DEVICE CHECK
Date Time Interrogation Session: 20160523041915
Zone Setting Detection Interval: 2000 ms
Zone Setting Detection Interval: 340 ms
Zone Setting Detection Interval: 4500 ms

## 2014-07-15 ENCOUNTER — Encounter: Payer: Self-pay | Admitting: Internal Medicine

## 2014-07-16 ENCOUNTER — Encounter: Payer: Self-pay | Admitting: Internal Medicine

## 2014-07-18 ENCOUNTER — Encounter: Payer: Self-pay | Admitting: Internal Medicine

## 2014-07-20 ENCOUNTER — Encounter: Payer: BLUE CROSS/BLUE SHIELD | Admitting: Internal Medicine

## 2014-07-27 ENCOUNTER — Encounter: Payer: Self-pay | Admitting: Internal Medicine

## 2014-07-28 ENCOUNTER — Encounter: Payer: Self-pay | Admitting: Internal Medicine

## 2014-07-29 ENCOUNTER — Encounter: Payer: Self-pay | Admitting: Internal Medicine

## 2014-08-01 ENCOUNTER — Encounter: Payer: Self-pay | Admitting: Internal Medicine

## 2014-08-04 ENCOUNTER — Ambulatory Visit (INDEPENDENT_AMBULATORY_CARE_PROVIDER_SITE_OTHER): Payer: BLUE CROSS/BLUE SHIELD | Admitting: *Deleted

## 2014-08-04 ENCOUNTER — Other Ambulatory Visit: Payer: Self-pay | Admitting: Cardiology

## 2014-08-04 ENCOUNTER — Encounter: Payer: Self-pay | Admitting: Internal Medicine

## 2014-08-04 DIAGNOSIS — I4891 Unspecified atrial fibrillation: Secondary | ICD-10-CM

## 2014-08-04 NOTE — Progress Notes (Signed)
Loop recorder 

## 2014-08-05 ENCOUNTER — Encounter: Payer: Self-pay | Admitting: Internal Medicine

## 2014-08-06 ENCOUNTER — Telehealth: Payer: Self-pay | Admitting: *Deleted

## 2014-08-06 DIAGNOSIS — I4891 Unspecified atrial fibrillation: Secondary | ICD-10-CM

## 2014-08-06 MED ORDER — FLECAINIDE ACETATE 100 MG PO TABS: 100.0000 mg | ORAL_TABLET | Freq: Two times a day (BID) | ORAL | Status: DC

## 2014-08-06 MED ORDER — DILTIAZEM HCL ER COATED BEADS 180 MG PO CP24: 180.0000 mg | ORAL_CAPSULE | Freq: Every day | ORAL | Status: DC

## 2014-08-06 NOTE — Telephone Encounter (Signed)
Spoke with patient about tachy episodes on LINQ transmission- Dr. Johney Frame recommend increasing Cardizem CD from 120mg  daily to 180mg  daily as well as increasing flecainide from 50mg  twice a day to 100mg  twice a day. Patient verbalizes understanding. Pharmacy confirmed as well as appointment on 08/10/14 with Dr. Johney Frame.  Patient reported increased stress x 2 weeks- dog was sick and she had to have him put down on Monday.

## 2014-08-07 LAB — CUP PACEART REMOTE DEVICE CHECK
Date Time Interrogation Session: 20160621041957
Zone Setting Detection Interval: 2000 ms
Zone Setting Detection Interval: 340 ms
Zone Setting Detection Interval: 4500 ms

## 2014-08-10 ENCOUNTER — Encounter: Payer: Self-pay | Admitting: Internal Medicine

## 2014-08-10 ENCOUNTER — Ambulatory Visit (INDEPENDENT_AMBULATORY_CARE_PROVIDER_SITE_OTHER): Payer: BLUE CROSS/BLUE SHIELD | Admitting: Internal Medicine

## 2014-08-10 VITALS — BP 124/66 | HR 59 | Ht 67.0 in | Wt 192.8 lb

## 2014-08-10 DIAGNOSIS — I480 Paroxysmal atrial fibrillation: Secondary | ICD-10-CM | POA: Diagnosis not present

## 2014-08-10 DIAGNOSIS — I5022 Chronic systolic (congestive) heart failure: Secondary | ICD-10-CM | POA: Diagnosis not present

## 2014-08-10 LAB — CUP PACEART INCLINIC DEVICE CHECK
Date Time Interrogation Session: 20160627171707
Zone Setting Detection Interval: 2000 ms
Zone Setting Detection Interval: 340 ms
Zone Setting Detection Interval: 4500 ms

## 2014-08-10 NOTE — Patient Instructions (Signed)
Medication Instructions:  Your physician recommends that you continue on your current medications as directed. Please refer to the Current Medication list given to you today.   Labwork: None ordered  Testing/Procedures: None ordered  Follow-Up: Your physician recommends that you schedule a follow-up appointment in: 6 weeks with Dr Allred   Any Other Special Instructions Will Be Listed Below (If Applicable).   

## 2014-08-10 NOTE — Progress Notes (Signed)
Electrophysiology Office Note   Date:  08/10/2014   ID:  Gaetana, Kawahara 1961-02-06, MRN [id number]  PCP:  Londell Moh, MD   Primary Electrophysiologist: Hillis Range, MD    Chief Complaint  Patient presents with  . Persistent AFIB     History of Present Illness: Kiara [name] is a 54 y.o. female who presents today for electrophysiology evaluation.   Doing reasonably well.  Continues to have some afib but less.  Unaware of triggers though episodes are more frequent at night.  Denies symptoms of OSA. Episodes are decreased with flecainide.  I increased flecainide to 100mg  BID last week.  She thinks this may be causing mild headaches.  Today, she denies symptoms of palpitations, chest pain, shortness of breath, orthopnea, PND, lower extremity edema, claudication, dizziness, presyncope, syncope, bleeding, or neurologic sequela. The patient is tolerating medications without difficulties and is otherwise without complaint today.    Past Medical History  Diagnosis Date  . Asthma   . Hyperlipemia   . Obesity   . Osteopenia   . Hypothyroidism     past hx  . Depression   . Atrial fibrillation     a. admx with AFib with RVR and acute systolic CHF 07/2013; TEE with LAA clot and no DCCV done;    . NICM (nonischemic cardiomyopathy)     a. Echo (07/17/13):  EF 25-30%, diff HK, basal and mid anteroseptum/ basal inferoseptum and apical AK, trivial AI, mod MR, mod LAE, mild reduced RVSF, mild RAE  . Chronic systolic heart failure   . Coronary artery disease     a.  LHC (07/17/13):  LM 20%, ostial CFX 20-30%  . Nephrolithiasis 2013    "passed them"   Past Surgical History  Procedure Laterality Date  . Appendectomy  1978  . Abdominal hysterectomy  ~ 2001  . Tee without cardioversion N/A 07/18/2013    Procedure: TRANSESOPHAGEAL ECHOCARDIOGRAM (TEE);  Surgeon: Lewayne Bunting, MD;  Location: [facility] ENDOSCOPY;  Service: Cardiovascular;  Laterality: N/A;  . Tee without cardioversion  N/A 08/18/2013    Procedure: TRANSESOPHAGEAL ECHOCARDIOGRAM (TEE);  Surgeon: Donato Schultz, MD;  Location: [facility] ENDOSCOPY;  Service: Cardiovascular;  Laterality: N/A;  . Cardioversion N/A 08/18/2013    Procedure: CARDIOVERSION;  Surgeon: Donato Schultz, MD;  Location: [facility] ENDOSCOPY;  Service: Cardiovascular;  Laterality: N/A;  . Cardiac catheterization  07/2013  . Cardioversion N/A 08/29/2013    Procedure: CARDIOVERSION;  Surgeon: Pricilla Riffle, MD;  Location: [facility] OR;  Service: Cardiovascular;  Laterality: N/A;  . Tee without cardioversion N/A 10/27/2013    Procedure: TRANSESOPHAGEAL ECHOCARDIOGRAM (TEE);  Surgeon: Pricilla Riffle, MD;  Location: [facility] ENDOSCOPY;  Service: Cardiovascular;  Laterality: N/A;  . Ablation  10/28/13    PVI by Dr Johney Frame  . Left heart catheterization with coronary angiogram N/A 07/17/2013    Procedure: LEFT HEART CATHETERIZATION WITH CORONARY ANGIOGRAM;  Surgeon: Micheline Chapman, MD;  Location: [facility] CATH LAB;  Service: Cardiovascular;  Laterality: N/A;  . Atrial fibrillation ablation N/A 10/28/2013    Procedure: ATRIAL FIBRILLATION ABLATION;  Surgeon: Gardiner Rhyme, MD;  Location: [facility] CATH LAB;  Service: Cardiovascular;  Laterality: N/A;  . Loop recorder implant N/A 06/05/2014    Procedure: LOOP RECORDER IMPLANT;  Surgeon: Hillis Range, MD;  Location: [facility] CATH LAB;  Service: Cardiovascular;  Laterality: N/A;     Current Outpatient Prescriptions  Medication Sig Dispense Refill  . acetaminophen (TYLENOL) 325 MG tablet Take 650-975 mg by mouth every  6 (six) hours as needed for mild pain.     Marland Kitchen apixaban (ELIQUIS) 5 MG TABS tablet Take 5 mg by mouth 2 (two) times daily.    Marland Kitchen atorvastatin (LIPITOR) 40 MG tablet Take 40 mg by mouth daily at 6 PM.    . diltiazem (CARDIZEM CD) 180 MG 24 hr capsule Take 1 capsule (180 mg total) by mouth daily. 90 capsule 3  . flecainide (TAMBOCOR) 100 MG tablet Take 1 tablet (100 mg total) by mouth 2 (two) times daily. 180 tablet 3  . furosemide (LASIX) 20 MG tablet  Take 20 mg by mouth every other day.     . lisinopril (PRINIVIL,ZESTRIL) 5 MG tablet Take 5 mg by mouth daily.    . metoprolol tartrate (LOPRESSOR) 25 MG tablet Take 12.5 mg by mouth daily as needed (palpitations/AFIB).    Marland Kitchen potassium chloride (K-DUR,KLOR-CON) 10 MEQ tablet Take 10 mEq by mouth daily as needed (when taking lasix).     Marland Kitchen sertraline (ZOLOFT) 100 MG tablet Take 200 mg by mouth every morning.      No current facility-administered medications for this visit.    Allergies:   Azithromycin   Social History:  The patient  reports that she has never smoked. She has never used smokeless tobacco. She reports that she drinks about 0.6 oz of alcohol per week. She reports that she does not use illicit drugs.   Family History:  The patient's  family history includes Hypertension in her father and mother; Lymphoma in her mother.    ROS:  Please see the history of present illness.   All other systems are reviewed and negative.    PHYSICAL EXAM: VS:  BP 124/66 mmHg  Pulse 59  Ht 5\' 7"  (1.702 m)  Wt 87.454 kg (192 lb 12.8 oz)  BMI 30.19 kg/m2 , BMI Body mass index is 30.19 kg/(m^2). GEN: Well nourished, well developed, in no acute distress HEENT: normal Neck: no JVD, carotid bruits, or masses Cardiac: RRR; no murmurs, rubs, or gallops,no edema  Respiratory:  clear to auscultation bilaterally, normal work of breathing GI: soft, nontender, nondistended, + BS MS: no deformity or atrophy Skin: warm and dry  Neuro:  Strength and sensation are intact Psych: euthymic mood, full affect  ILR interrogation today is reviewed- AF burden 19%, no other arrhythmias   Recent Labs: 08/14/2013: Pro B Natriuretic peptide (BNP) 118.0* 08/29/2013: Magnesium 2.2 10/21/2013: Hemoglobin 13.1; Platelets 179.0 10/29/2013: BUN 10; Creatinine, Ser 0.83; Potassium 3.7; Sodium 140    Lipid Panel     Component Value Date/Time   CHOL 198 07/17/2013 0720   TRIG 69 07/17/2013 0720   HDL 59 07/17/2013 0720    CHOLHDL 3.4 07/17/2013 0720   VLDL 14 07/17/2013 0720   LDLCALC 125* 07/17/2013 0720     Wt Readings from Last 3 Encounters:  08/10/14 87.454 kg (192 lb 12.8 oz)  07/06/14 86.909 kg (191 lb 9.6 oz)  06/05/14 88.451 kg (195 lb)      ASSESSMENT AND PLAN:  1.  Persistent afib She wishes to continue her current plan.  Hold off on ablation for now. Return in 6 weeks Could consider ablation if her afib continues.  2. Nonischemic CM EF has previously normalized We should try to keep in sinus long term.   Current medicines are reviewed at length with the patient today.   The patient does not have concerns regarding her medicines.  The following changes were made today:  none  Follow-up:  Randolm Idol, MD  08/10/2014 4:23 PM     [facility] [address] [place] [address] [phone] (office) [phone] (fax)

## 2014-08-12 ENCOUNTER — Encounter: Payer: Self-pay | Admitting: Internal Medicine

## 2014-08-19 ENCOUNTER — Encounter: Payer: Self-pay | Admitting: Internal Medicine

## 2014-08-24 ENCOUNTER — Encounter: Payer: Self-pay | Admitting: Internal Medicine

## 2014-08-25 ENCOUNTER — Encounter: Payer: Self-pay | Admitting: Internal Medicine

## 2014-08-28 ENCOUNTER — Other Ambulatory Visit: Payer: Self-pay | Admitting: Cardiology

## 2014-08-28 ENCOUNTER — Other Ambulatory Visit: Payer: Self-pay | Admitting: Internal Medicine

## 2014-08-28 NOTE — Telephone Encounter (Signed)
REFILL 

## 2014-09-03 ENCOUNTER — Ambulatory Visit (INDEPENDENT_AMBULATORY_CARE_PROVIDER_SITE_OTHER): Payer: BLUE CROSS/BLUE SHIELD | Admitting: *Deleted

## 2014-09-03 DIAGNOSIS — I480 Paroxysmal atrial fibrillation: Secondary | ICD-10-CM | POA: Diagnosis not present

## 2014-09-03 NOTE — Progress Notes (Signed)
Loop recorder 

## 2014-09-23 ENCOUNTER — Other Ambulatory Visit: Payer: Self-pay | Admitting: Internal Medicine

## 2014-09-24 ENCOUNTER — Telehealth: Payer: Self-pay | Admitting: Internal Medicine

## 2014-09-24 ENCOUNTER — Other Ambulatory Visit: Payer: Self-pay

## 2014-09-24 MED ORDER — APIXABAN 5 MG PO TABS: 5.0000 mg | ORAL_TABLET | Freq: Two times a day (BID) | ORAL | Status: DC

## 2014-09-24 NOTE — Telephone Encounter (Signed)
Pt requesting refill of Eliquis to [facility] in [place] on [address] and [address]

## 2014-09-25 LAB — CUP PACEART REMOTE DEVICE CHECK: Date Time Interrogation Session: 20160812122659

## 2014-09-28 ENCOUNTER — Ambulatory Visit (INDEPENDENT_AMBULATORY_CARE_PROVIDER_SITE_OTHER): Payer: BLUE CROSS/BLUE SHIELD | Admitting: Internal Medicine

## 2014-09-28 ENCOUNTER — Encounter: Payer: Self-pay | Admitting: *Deleted

## 2014-09-28 ENCOUNTER — Encounter: Payer: Self-pay | Admitting: Internal Medicine

## 2014-09-28 VITALS — BP 138/74 | HR 103 | Ht 67.0 in | Wt 190.6 lb

## 2014-09-28 DIAGNOSIS — I480 Paroxysmal atrial fibrillation: Secondary | ICD-10-CM

## 2014-09-28 DIAGNOSIS — I4891 Unspecified atrial fibrillation: Secondary | ICD-10-CM

## 2014-09-28 DIAGNOSIS — I429 Cardiomyopathy, unspecified: Secondary | ICD-10-CM | POA: Diagnosis not present

## 2014-09-28 DIAGNOSIS — I428 Other cardiomyopathies: Secondary | ICD-10-CM

## 2014-09-28 LAB — CUP PACEART INCLINIC DEVICE CHECK
Date Time Interrogation Session: 20160815170558
Zone Setting Detection Interval: 2000 ms
Zone Setting Detection Interval: 340 ms
Zone Setting Detection Interval: 4500 ms

## 2014-09-28 MED ORDER — LISINOPRIL 5 MG PO TABS: 5.0000 mg | ORAL_TABLET | Freq: Every day | ORAL | Status: DC

## 2014-09-28 MED ORDER — DILTIAZEM HCL ER COATED BEADS 120 MG PO CP24: 120.0000 mg | ORAL_CAPSULE | Freq: Two times a day (BID) | ORAL | Status: DC

## 2014-09-28 NOTE — Patient Instructions (Addendum)
Medication Instructions:  Your physician has recommended you make the following change in your medication:  1) Change the Diltiazem to 120 mg twice daily   Labwork: Your physician recommends that you return for lab work on 10/29/14 at 10:00am You do not have to fast   Testing/Procedures: Your physician has recommended that you have an ablation. Catheter ablation is a medical procedure used to treat some cardiac arrhythmias (irregular heartbeats). During catheter ablation, a long, thin, flexible tube is put into a blood vessel in your groin (upper thigh), or neck. This tube is called an ablation catheter. It is then guided to your heart through the blood vessel. Radio frequency waves destroy small areas of heart tissue where abnormal heartbeats may cause an arrhythmia to start. Please see the instruction sheet given to you today.    Follow-Up: Your physician recommends that you schedule a follow-up appointment in: 4 weeks from 11/05/14 with Rudi Coco, NP and 3 months from 11/05/14 with Dr Johney Frame   Any Other Special Instructions Will Be Listed Below (If Applicable).

## 2014-09-28 NOTE — Progress Notes (Signed)
Electrophysiology Office Note   Date:  09/28/2014   ID:  Kiara [name], Kiara [name] 04-Oct-1960, MRN [id number]  PCP:  Londell Moh, MD   Primary Electrophysiologist: Hillis Range, MD    Chief Complaint  Patient presents with  . PAF     History of Present Illness: Kiara [name] is a 54 y.o. female who presents today for electrophysiology evaluation.   Doing reasonably well.  Continues to have some afib.  Unaware of triggers though episodes are more frequent at night.  Denies symptoms of OSA. She continues to have RVR during episodes.  Today, she denies symptoms of palpitations, chest pain, shortness of breath, orthopnea, PND, lower extremity edema, claudication, dizziness, presyncope, syncope, bleeding, or neurologic sequela. The patient is tolerating medications without difficulties and is otherwise without complaint today.    Past Medical History  Diagnosis Date  . Asthma   . Hyperlipemia   . Obesity   . Osteopenia   . Hypothyroidism     past hx  . Depression   . Atrial fibrillation     a. admx with AFib with RVR and acute systolic CHF 07/2013; TEE with LAA clot and no DCCV done;    . NICM (nonischemic cardiomyopathy)     a. Echo (07/17/13):  EF 25-30%, diff HK, basal and mid anteroseptum/ basal inferoseptum and apical AK, trivial AI, mod MR, mod LAE, mild reduced RVSF, mild RAE  . Chronic systolic heart failure   . Coronary artery disease     a.  LHC (07/17/13):  LM 20%, ostial CFX 20-30%  . Nephrolithiasis 2013    "passed them"   Past Surgical History  Procedure Laterality Date  . Appendectomy  1978  . Abdominal hysterectomy  ~ 2001  . Tee without cardioversion N/A 07/18/2013    Procedure: TRANSESOPHAGEAL ECHOCARDIOGRAM (TEE);  Surgeon: Lewayne Bunting, MD;  Location: [facility] ENDOSCOPY;  Service: Cardiovascular;  Laterality: N/A;  . Tee without cardioversion N/A 08/18/2013    Procedure: TRANSESOPHAGEAL ECHOCARDIOGRAM (TEE);  Surgeon: Donato Schultz, MD;  Location: [facility]  ENDOSCOPY;  Service: Cardiovascular;  Laterality: N/A;  . Cardioversion N/A 08/18/2013    Procedure: CARDIOVERSION;  Surgeon: Donato Schultz, MD;  Location: [facility] ENDOSCOPY;  Service: Cardiovascular;  Laterality: N/A;  . Cardiac catheterization  07/2013  . Cardioversion N/A 08/29/2013    Procedure: CARDIOVERSION;  Surgeon: Pricilla Riffle, MD;  Location: [facility] OR;  Service: Cardiovascular;  Laterality: N/A;  . Tee without cardioversion N/A 10/27/2013    Procedure: TRANSESOPHAGEAL ECHOCARDIOGRAM (TEE);  Surgeon: Pricilla Riffle, MD;  Location: [facility] ENDOSCOPY;  Service: Cardiovascular;  Laterality: N/A;  . Ablation  10/28/13    PVI by Dr Johney Frame  . Left heart catheterization with coronary angiogram N/A 07/17/2013    Procedure: LEFT HEART CATHETERIZATION WITH CORONARY ANGIOGRAM;  Surgeon: Micheline Chapman, MD;  Location: [facility] CATH LAB;  Service: Cardiovascular;  Laterality: N/A;  . Atrial fibrillation ablation N/A 10/28/2013    Procedure: ATRIAL FIBRILLATION ABLATION;  Surgeon: Gardiner Rhyme, MD;  Location: [facility] CATH LAB;  Service: Cardiovascular;  Laterality: N/A;  . Loop recorder implant N/A 06/05/2014    Procedure: LOOP RECORDER IMPLANT;  Surgeon: Hillis Range, MD;  Location: [facility] CATH LAB;  Service: Cardiovascular;  Laterality: N/A;     Current Outpatient Prescriptions  Medication Sig Dispense Refill  . acetaminophen (TYLENOL) 325 MG tablet Take 650-975 mg by mouth every 6 (six) hours as needed for mild pain.     Marland Kitchen apixaban (ELIQUIS) 5 MG TABS tablet  Take 1 tablet (5 mg total) by mouth 2 (two) times daily. 60 tablet 6  . atorvastatin (LIPITOR) 40 MG tablet TAKE 1 TABLET BY MOUTH EVERY DAY AT 6PM 30 tablet 6  . diltiazem (CARDIZEM CD) 120 MG 24 hr capsule Take 1 capsule (120 mg total) by mouth 2 (two) times daily. 180 capsule 3  . flecainide (TAMBOCOR) 100 MG tablet Take 1 tablet (100 mg total) by mouth 2 (two) times daily. 180 tablet 3  . furosemide (LASIX) 20 MG tablet Take 20 mg by mouth every other day.     .  metoprolol tartrate (LOPRESSOR) 25 MG tablet Take 12.5 mg by mouth daily as needed (palpitations/AFIB).    Marland Kitchen potassium chloride (K-DUR,KLOR-CON) 10 MEQ tablet Take 10 mEq by mouth daily as needed (when taking lasix).     Marland Kitchen sertraline (ZOLOFT) 100 MG tablet Take 200 mg by mouth every morning.     Marland Kitchen lisinopril (PRINIVIL,ZESTRIL) 5 MG tablet Take 1 tablet (5 mg total) by mouth daily. 30 tablet 6   No current facility-administered medications for this visit.    Allergies:   Azithromycin   Social History:  The patient  reports that she has never smoked. She has never used smokeless tobacco. She reports that she drinks about 0.6 oz of alcohol per week. She reports that she does not use illicit drugs.   Family History:  The patient's  family history includes Hypertension in her father and mother; Lymphoma in her mother.    ROS:  Please see the history of present illness.   All other systems are reviewed and negative.    PHYSICAL EXAM: VS:  BP 138/74 mmHg  Pulse 103  Ht  (1.702 m)  Wt 86.456 kg (190 lb 9.6 oz)  BMI 29.85 kg/m2 , BMI Body mass index is 29.85 kg/(m^2). GEN: Well nourished, well developed, in no acute distress HEENT: normal Neck: no JVD, carotid bruits, or masses Cardiac: RRR; no murmurs, rubs, or gallops,no edema  Respiratory:  clear to auscultation bilaterally, normal work of breathing GI: soft, nontender, nondistended, + BS MS: no deformity or atrophy Skin: warm and dry  Neuro:  Strength and sensation are intact Psych: euthymic mood, full affect  ILR interrogation today is reviewed- AF burden 6%, no other arrhythmias   Recent Labs: 10/21/2013: Hemoglobin 13.1; Platelets 179.0 10/29/2013: BUN 10; Creatinine, Ser 0.83; Potassium 3.7; Sodium 140    Lipid Panel     Component Value Date/Time   CHOL 198 07/17/2013 0720   TRIG 69 07/17/2013 0720   HDL 59 07/17/2013 0720   CHOLHDL 3.4 07/17/2013 0720   VLDL 14 07/17/2013 0720   LDLCALC 125* 07/17/2013 0720      Wt Readings from Last 3 Encounters:  09/28/14 86.456 kg (190 lb 9.6 oz)  08/10/14 87.454 kg (192 lb 12.8 oz)  07/06/14 86.909 kg (191 lb 9.6 oz)      ASSESSMENT AND PLAN:  1.  Persistent afib She has failed medical therapy with flecainide Therapeutic strategies for afib including medicine and repeat ablation were discussed in detail with the patient today. Risk, benefits, and alternatives to EP study and radiofrequency ablation for afib were also discussed in detail today. These risks include but are not limited to stroke, bleeding, vascular damage, tamponade, perforation, damage to the esophagus, lungs, and other structures, pulmonary vein stenosis, worsening renal function, and death. The patient understands these risk and wishes to proceed.  We will therefore proceed with catheter ablation once the patient has  been adequately anticoagulated.   2. Nonischemic CM EF has previously normalized We should try to keep in sinus long term.   Current medicines are reviewed at length with the patient today.   The patient does not have concerns regarding her medicines.  The following changes were made today:  none    Signed, Hillis Range, MD  09/28/2014 5:25 PM     [facility] [address] [place] [address] [phone] (office) [phone] (fax)

## 2014-10-02 ENCOUNTER — Ambulatory Visit: Payer: BLUE CROSS/BLUE SHIELD | Admitting: *Deleted

## 2014-10-02 ENCOUNTER — Encounter: Payer: Self-pay | Admitting: Internal Medicine

## 2014-10-02 DIAGNOSIS — I480 Paroxysmal atrial fibrillation: Secondary | ICD-10-CM

## 2014-10-12 LAB — CUP PACEART REMOTE DEVICE CHECK: Date Time Interrogation Session: 20160829123532

## 2014-10-12 NOTE — Progress Notes (Signed)
Carelink summary report received. Battery status OK. Normal device function. No new symptom, tachy, brady, or pause episodes. 22 AF episodes, burden 6.7%, +Eliquis and diltiazem, avg V rate controlled. AF alerts in Carelink changed to AF managment alerts. Monthly summary reports and ROV on 12/03/2014 at 10:00am in AF Clinic and ROV with JA on 02/11/2015 at 10:00am.

## 2014-10-29 ENCOUNTER — Other Ambulatory Visit (INDEPENDENT_AMBULATORY_CARE_PROVIDER_SITE_OTHER): Payer: BLUE CROSS/BLUE SHIELD

## 2014-10-29 DIAGNOSIS — I4891 Unspecified atrial fibrillation: Secondary | ICD-10-CM | POA: Diagnosis not present

## 2014-10-29 LAB — BASIC METABOLIC PANEL
BUN: 14 mg/dL (ref 6–23)
CO2: 27 mEq/L (ref 19–32)
Calcium: 9.3 mg/dL (ref 8.4–10.5)
Chloride: 106 mEq/L (ref 96–112)
Creatinine, Ser: 0.66 mg/dL (ref 0.40–1.20)
GFR: 98.36 mL/min (ref >60.00)
Glucose, Bld: 81 mg/dL (ref 70–99)
Potassium: 4 mEq/L (ref 3.5–5.1)
Sodium: 140 mEq/L (ref 135–145)

## 2014-10-29 LAB — CBC WITH DIFFERENTIAL/PLATELET
Basophils Absolute: 0 10*3/uL (ref 0.0–0.1)
Basophils Relative: 0.5 % (ref 0.0–3.0)
Eosinophils Absolute: 0.2 10*3/uL (ref 0.0–0.7)
Eosinophils Relative: 2.4 % (ref 0.0–5.0)
HCT: 39.8 % (ref 36.0–46.0)
Hemoglobin: 13.6 g/dL (ref 12.0–15.0)
Lymphocytes Relative: 19.8 % (ref 12.0–46.0)
Lymphs Abs: 1.3 10*3/uL (ref 0.7–4.0)
MCHC: 34.1 g/dL (ref 30.0–36.0)
MCV: 90.2 fl (ref 78.0–100.0)
Monocytes Absolute: 0.6 10*3/uL (ref 0.1–1.0)
Monocytes Relative: 8.9 % (ref 3.0–12.0)
Neutro Abs: 4.4 10*3/uL (ref 1.4–7.7)
Neutrophils Relative %: 68.4 % (ref 43.0–77.0)
Platelets: 172 10*3/uL (ref 150.0–400.0)
RBC: 4.41 Mil/uL (ref 3.87–5.11)
RDW: 13.5 % (ref 11.5–15.5)
WBC: 6.5 10*3/uL (ref 4.0–10.5)

## 2014-11-02 ENCOUNTER — Encounter: Payer: Self-pay | Admitting: Internal Medicine

## 2014-11-02 ENCOUNTER — Ambulatory Visit (INDEPENDENT_AMBULATORY_CARE_PROVIDER_SITE_OTHER): Payer: BLUE CROSS/BLUE SHIELD | Admitting: *Deleted

## 2014-11-02 DIAGNOSIS — I480 Paroxysmal atrial fibrillation: Secondary | ICD-10-CM | POA: Diagnosis not present

## 2014-11-04 ENCOUNTER — Ambulatory Visit (HOSPITAL_COMMUNITY)
Admission: RE | Admit: 2014-11-04 | Discharge: 2014-11-04 | Disposition: A | Discharge status: Home / Self Care | Payer: BLUE CROSS/BLUE SHIELD | Source: Ambulatory Visit | Attending: Internal Medicine | Admitting: Internal Medicine

## 2014-11-04 ENCOUNTER — Ambulatory Visit (HOSPITAL_BASED_OUTPATIENT_CLINIC_OR_DEPARTMENT_OTHER): Payer: BLUE CROSS/BLUE SHIELD

## 2014-11-04 ENCOUNTER — Encounter (HOSPITAL_COMMUNITY)
Admission: RE | Disposition: A | Discharge status: Home / Self Care | Payer: Self-pay | Source: Ambulatory Visit | Attending: Internal Medicine

## 2014-11-04 ENCOUNTER — Encounter (HOSPITAL_COMMUNITY): Payer: Self-pay | Admitting: *Deleted

## 2014-11-04 DIAGNOSIS — I340 Nonrheumatic mitral (valve) insufficiency: Secondary | ICD-10-CM | POA: Diagnosis not present

## 2014-11-04 DIAGNOSIS — I4891 Unspecified atrial fibrillation: Secondary | ICD-10-CM

## 2014-11-04 HISTORY — PX: TEE WITHOUT CARDIOVERSION: SHX5443

## 2014-11-04 SURGERY — ECHOCARDIOGRAM, TRANSESOPHAGEAL
Anesthesia: Moderate Sedation

## 2014-11-04 MED ORDER — MIDAZOLAM HCL 10 MG/2ML IJ SOLN
INTRAMUSCULAR | Status: DC | PRN
  Administered 2014-11-04: 1 mg via INTRAVENOUS
  Administered 2014-11-04 (×2): 2 mg via INTRAVENOUS
  Administered 2014-11-04: 1 mg via INTRAVENOUS

## 2014-11-04 MED ORDER — SODIUM CHLORIDE 0.9 % IV SOLN
INTRAVENOUS | Status: DC
  Administered 2014-11-04: 500 mL via INTRAVENOUS

## 2014-11-04 MED ORDER — BUTAMBEN-TETRACAINE-BENZOCAINE 2-2-14 % EX AERO
INHALATION_SPRAY | CUTANEOUS | Status: DC | PRN
  Administered 2014-11-04: 2 via TOPICAL

## 2014-11-04 MED ORDER — LIDOCAINE VISCOUS 2 % MT SOLN
OROMUCOSAL | Status: AC
  Filled 2014-11-04: qty 15

## 2014-11-04 MED ORDER — MIDAZOLAM HCL 5 MG/ML IJ SOLN
INTRAMUSCULAR | Status: AC
  Filled 2014-11-04: qty 2

## 2014-11-04 MED ORDER — FENTANYL CITRATE (PF) 100 MCG/2ML IJ SOLN
INTRAMUSCULAR | Status: AC
  Filled 2014-11-04: qty 2

## 2014-11-04 MED ORDER — LIDOCAINE VISCOUS 2 % MT SOLN
OROMUCOSAL | Status: DC | PRN
Start: 2014-11-04 — End: 2014-11-04
  Administered 2014-11-04: 1 via OROMUCOSAL

## 2014-11-04 MED ORDER — FENTANYL CITRATE (PF) 100 MCG/2ML IJ SOLN
INTRAMUSCULAR | Status: DC | PRN
  Administered 2014-11-04: 25 ug via INTRAVENOUS
  Administered 2014-11-04: 50 ug via INTRAVENOUS
  Administered 2014-11-04: 25 ug via INTRAVENOUS

## 2014-11-04 NOTE — Progress Notes (Signed)
*  PRELIMINARY RESULTS* Echocardiogram Echocardiogram Transesophageal has been performed.  Kiara [name] 11/04/2014, 10:11 AM

## 2014-11-04 NOTE — Discharge Instructions (Signed)
Conscious Sedation, Adult, Care After °Refer to this sheet in the next few weeks. These instructions provide you with information on caring for yourself after your procedure. Your health care provider may also give you more specific instructions. Your treatment has been planned according to current medical practices, but problems sometimes occur. Call your health care provider if you have any problems or questions after your procedure. °WHAT TO EXPECT AFTER THE PROCEDURE  °After your procedure: °· You may feel sleepy, clumsy, and have poor balance for several hours. °· Vomiting may occur if you eat too soon after the procedure. °HOME CARE INSTRUCTIONS °· Do not participate in any activities where you could become injured for at least 24 hours. Do not: °¨ Drive. °¨ Swim. °¨ Ride a bicycle. °¨ Operate heavy machinery. °¨ Cook. °¨ Use power tools. °¨ Climb ladders. °¨ Work from a high place. °· Do not make important decisions or sign legal documents until you are improved. °· If you vomit, drink water, juice, or soup when you can drink without vomiting. Make sure you have little or no nausea before eating solid foods. °· Only take over-the-counter or prescription medicines for pain, discomfort, or fever as directed by your health care provider. °· Make sure you and your family fully understand everything about the medicines given to you, including what side effects may occur. °· You should not drink alcohol, take sleeping pills, or take medicines that cause drowsiness for at least 24 hours. °· If you smoke, do not smoke without supervision. °· If you are feeling better, you may resume normal activities 24 hours after you were sedated. °· Keep all appointments with your health care provider. °SEEK MEDICAL CARE IF: °· Your skin is pale or bluish in color. °· You continue to feel nauseous or vomit. °· Your pain is getting worse and is not helped by medicine. °· You have bleeding or swelling. °· You are still sleepy or  feeling clumsy after 24 hours. °SEEK IMMEDIATE MEDICAL CARE IF: °· You develop a rash. °· You have difficulty breathing. °· You develop any type of allergic problem. °· You have a fever. °MAKE SURE YOU: °· Understand these instructions. °· Will watch your condition. °· Will get help right away if you are not doing well or get worse. °Document Released: 11/20/2012 Document Reviewed: 11/20/2012 °ExitCare® Patient Information ©2015 [employer]. This information is not intended to replace advice given to you by your health care provider. Make sure you discuss any questions you have with your health care provider. °  °

## 2014-11-04 NOTE — H&P (Signed)
     INTERVAL PROCEDURE H&P  History and Physical Interval Note:  11/04/2014 8:28 AM  Kiara [name] has presented today for their planned procedure. The various methods of treatment have been discussed with the patient and family. After consideration of risks, benefits and other options for treatment, the patient has consented to the procedure.  The patients' outpatient history has been reviewed, patient examined, and no change in status from most recent office note within the past 30 days. I have reviewed the patients' chart and labs and will proceed as planned. Questions were answered to the patient's satisfaction.   Chrystie Nose, MD, [facility] Attending Cardiologist [facility]  [facility] 11/04/2014, 8:28 AM

## 2014-11-04 NOTE — CV Procedure (Signed)
    TRANSESOPHAGEAL ECHOCARDIOGRAM (TEE) NOTE  INDICATIONS: atrial fibrillation, pre-ablation  PROCEDURE:   Informed consent was obtained prior to the procedure. The risks, benefits and alternatives for the procedure were discussed and the patient comprehended these risks.  Risks include, but are not limited to, cough, sore throat, vomiting, nausea, somnolence, esophageal and stomach trauma or perforation, bleeding, low blood pressure, aspiration, pneumonia, infection, trauma to the teeth and death.    After a procedural time-out, the patient was given 6 mg versed and 100 mcg fentanyl for moderate sedation.  The oropharynx was anesthetized 10 cc of topical 1% viscous lidocaine and 2 cetacaine sprays.  The transesophageal probe was inserted in the esophagus and stomach without difficulty and multiple views were obtained.  The patient was kept under observation until the patient left the procedure room.  The patient left the procedure room in stable condition.   Agitated microbubble saline contrast was not administered.  COMPLICATIONS:    There were no immediate complications.  Findings:  1. LEFT VENTRICLE: The left ventricular wall thickness is normal.  The left ventricular cavity is normal in size. Wall motion is normal.  LVEF is 55%.  2. RIGHT VENTRICLE:  The right ventricle is normal in structure and function without any thrombus or masses.    3. LEFT ATRIUM:  The left atrium is dilated in size without any thrombus or masses.  There is not spontaneous echo contrast ("smoke") in the left atrium consistent with a low flow state.  4. LEFT ATRIAL APPENDAGE:  The left atrial appendage is free of any thrombus or masses. The appendage has single lobes. Pulse doppler indicates high flow in the appendage.  5. ATRIAL SEPTUM:  The atrial septum appears intact and is free of thrombus and/or masses.  There is no evidence for interatrial shunting by color doppler and saline microbubble.  6. RIGHT  ATRIUM:  The right atrium is normal in size and function without any thrombus or masses.  7. MITRAL VALVE:  The mitral valve is normal in structure and function with Mild regurgitation and 2 distinct jets.  There were no vegetations or stenosis.  8. AORTIC VALVE:  The aortic valve is trileaflet, normal in structure and function with no regurgitation.  There were no vegetations or stenosis  9. TRICUSPID VALVE:  The tricuspid valve is normal in structure and function with trivial regurgitation.  There were no vegetations or stenosis  10.  PULMONIC VALVE:  The pulmonic valve is normal in structure and function with trivial regurgitation.  There were no vegetations or stenosis.   11. AORTIC ARCH, ASCENDING AND DESCENDING AORTA:  There was no Myrtis Ser et. Al, 1992) atherosclerosis of the ascending aorta, aortic arch, or proximal descending aorta.  12. PULMONARY VEINS: Anomalous pulmonary venous return was not noted.  13. PERICARDIUM: The pericardium appeared normal and non-thickened.  There is no pericardial effusion.  IMPRESSION:   1. No LAA thrombus 2. Negative for PFO by color doppler 3. LVEF 55% 4. Mild MR  RECOMMENDATIONS:    1.  Proceed with repeat catheter ablation tomorrow with Dr. Johney Frame.  Time Spent Directly with the Patient:  45 minutes   Chrystie Nose, MD, [facility] Attending Cardiologist [facility]  11/04/2014, 9:37 AM

## 2014-11-04 NOTE — Progress Notes (Signed)
Loop recorder 

## 2014-11-05 ENCOUNTER — Ambulatory Visit (HOSPITAL_COMMUNITY): Payer: BLUE CROSS/BLUE SHIELD | Admitting: Anesthesiology

## 2014-11-05 ENCOUNTER — Ambulatory Visit (HOSPITAL_COMMUNITY)
Admission: RE | Admit: 2014-11-05 | Discharge: 2014-11-06 | Disposition: A | Discharge status: Home / Self Care | Payer: BLUE CROSS/BLUE SHIELD | Source: Ambulatory Visit | Attending: Internal Medicine | Admitting: Internal Medicine

## 2014-11-05 ENCOUNTER — Encounter (HOSPITAL_COMMUNITY)
Admission: RE | Disposition: A | Discharge status: Home / Self Care | Payer: Self-pay | Source: Ambulatory Visit | Attending: Internal Medicine

## 2014-11-05 ENCOUNTER — Encounter (HOSPITAL_COMMUNITY): Payer: Self-pay | Admitting: Anesthesiology

## 2014-11-05 DIAGNOSIS — Z79899 Other long term (current) drug therapy: Secondary | ICD-10-CM | POA: Diagnosis not present

## 2014-11-05 DIAGNOSIS — I428 Other cardiomyopathies: Secondary | ICD-10-CM | POA: Insufficient documentation

## 2014-11-05 DIAGNOSIS — F329 Major depressive disorder, single episode, unspecified: Secondary | ICD-10-CM | POA: Diagnosis not present

## 2014-11-05 DIAGNOSIS — Z6829 Body mass index (BMI) 29.0-29.9, adult: Secondary | ICD-10-CM | POA: Diagnosis not present

## 2014-11-05 DIAGNOSIS — I4892 Unspecified atrial flutter: Secondary | ICD-10-CM | POA: Insufficient documentation

## 2014-11-05 DIAGNOSIS — Z7901 Long term (current) use of anticoagulants: Secondary | ICD-10-CM | POA: Diagnosis not present

## 2014-11-05 DIAGNOSIS — I5022 Chronic systolic (congestive) heart failure: Secondary | ICD-10-CM | POA: Diagnosis not present

## 2014-11-05 DIAGNOSIS — E785 Hyperlipidemia, unspecified: Secondary | ICD-10-CM | POA: Insufficient documentation

## 2014-11-05 DIAGNOSIS — I480 Paroxysmal atrial fibrillation: Secondary | ICD-10-CM | POA: Diagnosis not present

## 2014-11-05 DIAGNOSIS — I4891 Unspecified atrial fibrillation: Secondary | ICD-10-CM | POA: Diagnosis present

## 2014-11-05 DIAGNOSIS — E669 Obesity, unspecified: Secondary | ICD-10-CM | POA: Insufficient documentation

## 2014-11-05 DIAGNOSIS — I481 Persistent atrial fibrillation: Secondary | ICD-10-CM | POA: Diagnosis not present

## 2014-11-05 DIAGNOSIS — I2510 Atherosclerotic heart disease of native coronary artery without angina pectoris: Secondary | ICD-10-CM | POA: Diagnosis not present

## 2014-11-05 DIAGNOSIS — J45909 Unspecified asthma, uncomplicated: Secondary | ICD-10-CM | POA: Insufficient documentation

## 2014-11-05 HISTORY — PX: ELECTROPHYSIOLOGIC STUDY: SHX172A

## 2014-11-05 LAB — POCT ACTIVATED CLOTTING TIME
Activated Clotting Time: 306 s
Activated Clotting Time: 288 s
Activated Clotting Time: 140 seconds

## 2014-11-05 SURGERY — ATRIAL FIBRILLATION ABLATION
Anesthesia: General

## 2014-11-05 MED ORDER — PROTAMINE SULFATE 10 MG/ML IV SOLN
INTRAVENOUS | Status: DC | PRN
  Administered 2014-11-05: 30 mg via INTRAVENOUS

## 2014-11-05 MED ORDER — ADENOSINE 6 MG/2ML IV SOLN
INTRAVENOUS | Status: DC | PRN
  Administered 2014-11-05 (×3): 12 mg via INTRAVENOUS

## 2014-11-05 MED ORDER — ONDANSETRON HCL 4 MG/2ML IJ SOLN: 4.0000 mg | Freq: Four times a day (QID) | INTRAMUSCULAR | Status: DC | PRN

## 2014-11-05 MED ORDER — SODIUM CHLORIDE 0.9 % IJ SOLN
3.0000 mL | Freq: Two times a day (BID) | INTRAMUSCULAR | Status: DC
Start: 2014-11-05 — End: 2014-11-06
  Administered 2014-11-05: 3 mL via INTRAVENOUS

## 2014-11-05 MED ORDER — MIDAZOLAM HCL 5 MG/5ML IJ SOLN
INTRAMUSCULAR | Status: DC | PRN
  Administered 2014-11-05: 2 mg via INTRAVENOUS
  Administered 2014-11-05: 1 mg via INTRAVENOUS

## 2014-11-05 MED ORDER — APIXABAN 5 MG PO TABS
5.0000 mg | ORAL_TABLET | Freq: Two times a day (BID) | ORAL | Status: DC
  Administered 2014-11-05 – 2014-11-06 (×2): 5 mg via ORAL
  Filled 2014-11-05 (×2): qty 1

## 2014-11-05 MED ORDER — DOBUTAMINE IN D5W 4-5 MG/ML-% IV SOLN
INTRAVENOUS | Status: DC | PRN
  Administered 2014-11-05: 20 ug/kg/min via INTRAVENOUS

## 2014-11-05 MED ORDER — ADENOSINE 6 MG/2ML IV SOLN
INTRAVENOUS | Status: AC
  Filled 2014-11-05: qty 16

## 2014-11-05 MED ORDER — SODIUM CHLORIDE 0.9 % IJ SOLN: 3.0000 mL | INTRAMUSCULAR | Status: DC | PRN

## 2014-11-05 MED ORDER — PHENYLEPHRINE HCL 10 MG/ML IJ SOLN
INTRAMUSCULAR | Status: DC | PRN
Start: 2014-11-05 — End: 2014-11-05
  Administered 2014-11-05: 80 ug via INTRAVENOUS

## 2014-11-05 MED ORDER — FENTANYL CITRATE (PF) 100 MCG/2ML IJ SOLN
INTRAMUSCULAR | Status: DC | PRN
  Administered 2014-11-05: 50 ug via INTRAVENOUS
  Administered 2014-11-05 (×2): 25 ug via INTRAVENOUS

## 2014-11-05 MED ORDER — FENTANYL CITRATE (PF) 100 MCG/2ML IJ SOLN: 25.0000 ug | INTRAMUSCULAR | Status: DC | PRN

## 2014-11-05 MED ORDER — SERTRALINE HCL 100 MG PO TABS
200.0000 mg | ORAL_TABLET | ORAL | Status: DC
  Administered 2014-11-06: 200 mg via ORAL
  Filled 2014-11-05: qty 2

## 2014-11-05 MED ORDER — HEPARIN SODIUM (PORCINE) 1000 UNIT/ML IJ SOLN
INTRAMUSCULAR | Status: DC | PRN
  Administered 2014-11-05: 12000 [IU] via INTRAVENOUS

## 2014-11-05 MED ORDER — HEPARIN SODIUM (PORCINE) 1000 UNIT/ML IJ SOLN
INTRAMUSCULAR | Status: DC | PRN
  Administered 2014-11-05: 2000 [IU] via INTRAVENOUS
  Administered 2014-11-05: 1000 [IU] via INTRAVENOUS

## 2014-11-05 MED ORDER — BUPIVACAINE HCL (PF) 0.25 % IJ SOLN
INTRAMUSCULAR | Status: AC
  Filled 2014-11-05: qty 30

## 2014-11-05 MED ORDER — BUPIVACAINE HCL (PF) 0.25 % IJ SOLN
INTRAMUSCULAR | Status: DC | PRN
  Administered 2014-11-05: 20 mL

## 2014-11-05 MED ORDER — LIDOCAINE HCL (CARDIAC) 20 MG/ML IV SOLN
INTRAVENOUS | Status: DC | PRN
  Administered 2014-11-05: 20 mg via INTRAVENOUS

## 2014-11-05 MED ORDER — SODIUM CHLORIDE 0.9 % IV SOLN: 250.0000 mL | INTRAVENOUS | Status: DC | PRN

## 2014-11-05 MED ORDER — DOBUTAMINE IN D5W 4-5 MG/ML-% IV SOLN
INTRAVENOUS | Status: AC
  Filled 2014-11-05: qty 250

## 2014-11-05 MED ORDER — IOHEXOL 350 MG/ML SOLN
INTRAVENOUS | Status: DC | PRN
  Administered 2014-11-05: 105 mL via INTRACARDIAC

## 2014-11-05 MED ORDER — HYDROCODONE-ACETAMINOPHEN 5-325 MG PO TABS: 1.0000 | ORAL_TABLET | ORAL | Status: DC | PRN

## 2014-11-05 MED ORDER — ACETAMINOPHEN 325 MG PO TABS
650.0000 mg | ORAL_TABLET | ORAL | Status: DC | PRN
  Administered 2014-11-05: 650 mg via ORAL
  Filled 2014-11-05: qty 2

## 2014-11-05 MED ORDER — HEPARIN SODIUM (PORCINE) 1000 UNIT/ML IJ SOLN
INTRAMUSCULAR | Status: AC
  Filled 2014-11-05: qty 1

## 2014-11-05 MED ORDER — ONDANSETRON HCL 4 MG/2ML IJ SOLN
INTRAMUSCULAR | Status: DC | PRN
  Administered 2014-11-05: 4 mg via INTRAVENOUS

## 2014-11-05 MED ORDER — PROPOFOL 10 MG/ML IV BOLUS
INTRAVENOUS | Status: DC | PRN
  Administered 2014-11-05: 20 mg via INTRAVENOUS
  Administered 2014-11-05: 150 mg via INTRAVENOUS
  Administered 2014-11-05 (×2): 30 mg via INTRAVENOUS

## 2014-11-05 MED ORDER — FLECAINIDE ACETATE 100 MG PO TABS
100.0000 mg | ORAL_TABLET | Freq: Once | ORAL | Status: AC
  Administered 2014-11-05: 100 mg via ORAL
  Filled 2014-11-05: qty 1

## 2014-11-05 MED ORDER — SODIUM CHLORIDE 0.9 % IV SOLN
10.0000 mg | INTRAVENOUS | Status: DC | PRN
  Administered 2014-11-05: 5 ug/min via INTRAVENOUS

## 2014-11-05 MED ORDER — DILTIAZEM HCL ER COATED BEADS 120 MG PO CP24
120.0000 mg | ORAL_CAPSULE | Freq: Once | ORAL | Status: AC
  Administered 2014-11-05: 120 mg via ORAL
  Filled 2014-11-05: qty 1

## 2014-11-05 MED ORDER — DOBUTAMINE IN D5W 4-5 MG/ML-% IV SOLN: INTRAVENOUS | Status: DC | PRN

## 2014-11-05 MED ORDER — SODIUM CHLORIDE 0.9 % IV SOLN
INTRAVENOUS | Status: DC
  Administered 2014-11-05 (×2): via INTRAVENOUS

## 2014-11-05 SURGICAL SUPPLY — 22 items
BAG SNAP BAND KOVER 36X36 (MISCELLANEOUS) ×2 IMPLANT
BLANKET WARM UNDERBOD FULL ACC (MISCELLANEOUS) ×2 IMPLANT
CATH DIAG 6FR PIGTAIL (CATHETERS) ×2 IMPLANT
CATH NAVISTAR SMARTTOUCH DF (ABLATOR) ×2 IMPLANT
CATH SOUNDSTAR 3D IMAGING (CATHETERS) ×2 IMPLANT
CATH VARIABLE LASSO NAV 2515 (CATHETERS) ×1 IMPLANT
CATH WEBSTER BI DIR CS D-F CRV (CATHETERS) ×2 IMPLANT
COVER SWIFTLINK CONNECTOR (BAG) ×2 IMPLANT
NDL TRANSEP BRK 71CM 407200 (NEEDLE) ×1 IMPLANT
NEEDLE TRANSEP BRK 71CM 407200 (NEEDLE) ×2 IMPLANT
PACK EP LATEX FREE (CUSTOM PROCEDURE TRAY) ×2
PACK EP LF (CUSTOM PROCEDURE TRAY) ×1 IMPLANT
PAD DEFIB LIFELINK (PAD) ×2 IMPLANT
PATCH CARTO3 (PAD) ×2 IMPLANT
SHEATH AVANTI 11F 11CM (SHEATH) ×2 IMPLANT
SHEATH PINNACLE 7F 10CM (SHEATH) ×3 IMPLANT
SHEATH PINNACLE 9F 10CM (SHEATH) ×2 IMPLANT
SHEATH SWARTZ TS SL2 63CM 8.5F (SHEATH) ×2 IMPLANT
SHIELD RADPAD SCOOP 12X17 (MISCELLANEOUS) ×2 IMPLANT
SYR MEDRAD MARK V 150ML (SYRINGE) ×2 IMPLANT
TUBING CONTRAST HIGH PRESS 48 (TUBING) ×2 IMPLANT
TUBING SMART ABLATE COOLFLOW (TUBING) ×2 IMPLANT

## 2014-11-05 NOTE — H&P (Signed)
PCP: Londell Moh, MD  Primary Electrophysiologist: Hillis Range, MD   Chief Complaint  Patient presents with  . PAF    History of Present Illness: Kiara [name] is a 54 y.o. female who presents today for repeat afib ablation. Doing reasonably well. Continues to have afib. Unaware of triggers though episodes are more frequent at night. Denies symptoms of OSA. She continues to have RVR during episodes.  Today, she denies symptoms of palpitations, chest pain, shortness of breath, orthopnea, PND, lower extremity edema, claudication, dizziness, presyncope, syncope, bleeding, or neurologic sequela. The patient is tolerating medications without difficulties and is otherwise without complaint today.    Past Medical History  Diagnosis Date  . Asthma   . Hyperlipemia   . Obesity   . Osteopenia   . Hypothyroidism     past hx  . Depression   . Atrial fibrillation     a. admx with AFib with RVR and acute systolic CHF 07/2013; TEE with LAA clot and no DCCV done;   . NICM (nonischemic cardiomyopathy)     a. Echo (07/17/13): EF 25-30%, diff HK, basal and mid anteroseptum/ basal inferoseptum and apical AK, trivial AI, mod MR, mod LAE, mild reduced RVSF, mild RAE  . Chronic systolic heart failure   . Coronary artery disease     a. LHC (07/17/13): LM 20%, ostial CFX 20-30%  . Nephrolithiasis 2013    "passed them"   Past Surgical History  Procedure Laterality Date  . Appendectomy  1978  . Abdominal hysterectomy  ~ 2001  . Tee without cardioversion N/A 07/18/2013    Procedure: TRANSESOPHAGEAL ECHOCARDIOGRAM (TEE); Surgeon: Lewayne Bunting, MD; Location: [facility] ENDOSCOPY; Service: Cardiovascular; Laterality: N/A;  . Tee without cardioversion N/A 08/18/2013    Procedure: TRANSESOPHAGEAL ECHOCARDIOGRAM (TEE); Surgeon: Donato Schultz, MD; Location: [facility] ENDOSCOPY; Service: Cardiovascular;  Laterality: N/A;  . Cardioversion N/A 08/18/2013    Procedure: CARDIOVERSION; Surgeon: Donato Schultz, MD; Location: [facility] ENDOSCOPY; Service: Cardiovascular; Laterality: N/A;  . Cardiac catheterization  07/2013  . Cardioversion N/A 08/29/2013    Procedure: CARDIOVERSION; Surgeon: Pricilla Riffle, MD; Location: [facility] OR; Service: Cardiovascular; Laterality: N/A;  . Tee without cardioversion N/A 10/27/2013    Procedure: TRANSESOPHAGEAL ECHOCARDIOGRAM (TEE); Surgeon: Pricilla Riffle, MD; Location: [facility] ENDOSCOPY; Service: Cardiovascular; Laterality: N/A;  . Ablation  10/28/13    PVI by Dr Johney Frame  . Left heart catheterization with coronary angiogram N/A 07/17/2013    Procedure: LEFT HEART CATHETERIZATION WITH CORONARY ANGIOGRAM; Surgeon: Micheline Chapman, MD; Location: [facility] CATH LAB; Service: Cardiovascular; Laterality: N/A;  . Atrial fibrillation ablation N/A 10/28/2013    Procedure: ATRIAL FIBRILLATION ABLATION; Surgeon: Gardiner Rhyme, MD; Location: [facility] CATH LAB; Service: Cardiovascular; Laterality: N/A;  . Loop recorder implant N/A 06/05/2014    Procedure: LOOP RECORDER IMPLANT; Surgeon: Hillis Range, MD; Location: [facility] CATH LAB; Service: Cardiovascular; Laterality: N/A;     Current Outpatient Prescriptions  Medication Sig Dispense Refill  . acetaminophen (TYLENOL) 325 MG tablet Take 650-975 mg by mouth every 6 (six) hours as needed for mild pain.     Marland Kitchen apixaban (ELIQUIS) 5 MG TABS tablet Take 1 tablet (5 mg total) by mouth 2 (two) times daily. 60 tablet 6  . atorvastatin (LIPITOR) 40 MG tablet TAKE 1 TABLET BY MOUTH EVERY DAY AT 6PM 30 tablet 6  . diltiazem (CARDIZEM CD) 120 MG 24 hr capsule Take 1 capsule (120 mg total) by mouth 2 (two) times daily. 180 capsule 3  . flecainide (TAMBOCOR) 100 MG tablet  Take 1 tablet (100 mg total) by mouth 2 (two) times daily. 180 tablet 3  . furosemide (LASIX) 20 MG tablet  Take 20 mg by mouth every other day.     . metoprolol tartrate (LOPRESSOR) 25 MG tablet Take 12.5 mg by mouth daily as needed (palpitations/AFIB).    Marland Kitchen potassium chloride (K-DUR,KLOR-CON) 10 MEQ tablet Take 10 mEq by mouth daily as needed (when taking lasix).     Marland Kitchen sertraline (ZOLOFT) 100 MG tablet Take 200 mg by mouth every morning.     Marland Kitchen lisinopril (PRINIVIL,ZESTRIL) 5 MG tablet Take 1 tablet (5 mg total) by mouth daily. 30 tablet 6   No current facility-administered medications for this visit.    Allergies: Azithromycin   Social History: The patient  reports that she has never smoked. She has never used smokeless tobacco. She reports that she drinks about 0.6 oz of alcohol per week. She reports that she does not use illicit drugs.   Family History: The patient's family history includes Hypertension in her father and mother; Lymphoma in her mother.    ROS: Please see the history of present illness. All other systems are reviewed and negative.    PHYSICAL EXAM: VS: BP 138/74 mmHg  Pulse 103  Ht 5\' 7"  (1.702 m)  Wt 86.456 kg (190 lb 9.6 oz)  BMI 29.85 kg/m2 , BMI Body mass index is 29.85 kg/(m^2). GEN: Well nourished, well developed, in no acute distress  HEENT: normal  Neck: no JVD, carotid bruits, or masses Cardiac: RRR; no murmurs, rubs, or gallops,no edema  Respiratory: clear to auscultation bilaterally, normal work of breathing GI: soft, nontender, nondistended, + BS MS: no deformity or atrophy  Skin: warm and dry  Neuro: Strength and sensation are intact Psych: euthymic mood, full affect  ILR interrogation today is reviewed- AF burden 6%, no other arrhythmias   Recent Labs: 10/21/2013: Hemoglobin 13.1; Platelets 179.0 10/29/2013: BUN 10; Creatinine, Ser 0.83; Potassium 3.7; Sodium 140    Lipid Panel   Labs (Brief)       Component Value Date/Time   CHOL 198 07/17/2013 0720   TRIG 69 07/17/2013 0720   HDL 59  07/17/2013 0720   CHOLHDL 3.4 07/17/2013 0720   VLDL 14 07/17/2013 0720   LDLCALC 125* 07/17/2013 0720       Wt Readings from Last 3 Encounters:  09/28/14 86.456 kg (190 lb 9.6 oz)  08/10/14 87.454 kg (192 lb 12.8 oz)  07/06/14 86.909 kg (191 lb 9.6 oz)      ASSESSMENT AND PLAN:  1. Persistent afib She has failed medical therapy with flecainide Therapeutic strategies for afib including medicine and repeat ablation were discussed in detail with the patient today. Risk, benefits, and alternatives to EP study and radiofrequency ablation for afib were also discussed in detail today. These risks include but are not limited to stroke, bleeding, vascular damage, tamponade, perforation, damage to the esophagus, lungs, and other structures, pulmonary vein stenosis, worsening renal function, and death. The patient understands these risk and wishes to proceed. We will therefore proceed with catheter ablation at this time.  Hillis Range MD, [facility] 11/05/2014 7:25 AM

## 2014-11-05 NOTE — Progress Notes (Signed)
12 lead EKG done per order

## 2014-11-05 NOTE — Anesthesia Postprocedure Evaluation (Signed)
  Anesthesia Post-op Note  Patient: Kiara [name]  Procedure(s) Performed: Procedure(s): Atrial Fibrillation Ablation (N/A)  Patient Location: PACU  Anesthesia Type:General  Level of Consciousness: awake  Airway and Oxygen Therapy: Patient Spontanous Breathing  Post-op Pain: mild  Post-op Assessment: Post-op Vital signs reviewed              Post-op Vital Signs: Reviewed  Last Vitals:  Filed Vitals:   11/05/14 1200  BP: 108/65  Pulse: 65  Temp:   Resp: 20    Complications: No apparent anesthesia complications

## 2014-11-05 NOTE — Anesthesia Procedure Notes (Signed)
Procedure Name: LMA Insertion Date/Time: 11/05/2014 7:46 AM Performed by: Sharlene Dory E Pre-anesthesia Checklist: Patient identified, Emergency Drugs available, Suction available, Patient being monitored and Timeout performed Patient Re-evaluated:Patient Re-evaluated prior to inductionOxygen Delivery Method: Circle system utilized Preoxygenation: Pre-oxygenation with 100% oxygen Intubation Type: IV induction LMA: LMA inserted LMA Size: 4.0 Number of attempts: 1 Placement Confirmation: positive ETCO2 and breath sounds checked- equal and bilateral Tube secured with: Tape Dental Injury: Teeth and Oropharynx as per pre-operative assessment

## 2014-11-05 NOTE — Progress Notes (Signed)
Site area: rt groin 3 fv sheaths Site Prior to Removal:  Level  0 Pressure Applied For:  20 minutes Manual:   yes Patient Status During Pull:  stable Post Pull Site:  Level   0 Post Pull Instructions Given:  yes Post Pull Pulses Present: yes Dressing Applied:   Small tegaderm Bedrest begins @   1205 Comments:  IV saline locked

## 2014-11-05 NOTE — Progress Notes (Signed)
While pt was on the bedpan she began bleeding from right femoral artery/vein.  Direct manual pressure was held to site  15 minutes.  Hemostatsis was achieved within 2 minutes.  Right groin, at end of hold was a level 0. 2+ pedal pulse in the right DP.  Pt was educated on bleeding precaution and reminded to call the RN if she notices any bleeding.  VS HR 63, BP 115/58, RR 13, SPO2 at 96% on room air.

## 2014-11-05 NOTE — Anesthesia Preprocedure Evaluation (Addendum)
Anesthesia Evaluation  Patient identified by MRN, date of birth, ID band Patient awake    Reviewed: Allergy & Precautions, NPO status , Patient's Chart, lab work & pertinent test results  Airway Mallampati: I       Dental  (+) Teeth Intact   Pulmonary asthma ,    breath sounds clear to auscultation       Cardiovascular + CAD   Rhythm:Regular Rate:Normal     Neuro/Psych    GI/Hepatic negative GI ROS, Neg liver ROS,   Endo/Other  Hypothyroidism   Renal/GU Renal disease     Musculoskeletal   Abdominal   Peds  Hematology   Anesthesia Other Findings   Reproductive/Obstetrics                            Anesthesia Physical Anesthesia Plan  ASA: III  Anesthesia Plan: General   Post-op Pain Management:    Induction: Intravenous  Airway Management Planned: LMA  Additional Equipment:   Intra-op Plan:   Post-operative Plan: Extubation in OR  Informed Consent: I have reviewed the patients History and Physical, chart, labs and discussed the procedure including the risks, benefits and alternatives for the proposed anesthesia with the patient or authorized representative who has indicated his/her understanding and acceptance.   Dental advisory given  Plan Discussed with: Anesthesiologist  Anesthesia Plan Comments:        Anesthesia Quick Evaluation

## 2014-11-05 NOTE — Plan of Care (Signed)
Problem: Consults Goal: EP Study Patient Education (See Patient Education module for education specifics.) Outcome: Progressing 2nd ablation   Problem: Phase I Progression Outcomes Goal: Hemostasis of puncture sites Outcome: Progressing Brief rebleed post procedure. Hemostasis achieved. Groin stable Goal: Initial discharge plan identified Outcome: Completed/Met Date Met:  11/05/14 Home with husband in a.m.

## 2014-11-05 NOTE — Transfer of Care (Signed)
Immediate Anesthesia Transfer of Care Note  Patient: Kiara [name]  Procedure(s) Performed: Procedure(s): Atrial Fibrillation Ablation (N/A)  Patient Location: PACU   Anesthesia Type:General  Level of Consciousness: awake, alert  and oriented  Airway & Oxygen Therapy: Patient Spontanous Breathing and Patient connected to nasal cannula oxygen  Post-op Assessment: Report given to RN, Post -op Vital signs reviewed and stable and Patient moving all extremities X 4  Post vital signs: Reviewed and stable  Last Vitals:  Filed Vitals:   11/05/14 0604  BP: 129/80  Pulse: 105  Temp: 36.7 C  Resp: 18    Complications: No apparent anesthesia complications

## 2014-11-05 NOTE — Progress Notes (Signed)
Patient complaining of increased chest pressure with burning.  Rating pain 6/10.  EKG obtained, MD made aware.

## 2014-11-05 NOTE — Discharge Summary (Signed)
ELECTROPHYSIOLOGY PROCEDURE DISCHARGE SUMMARY    Patient ID: Kiara [name],  MRN: [id number], DOB/AGE: [birth year] 56 y.o.  Admit date: 11/05/2014 Discharge date: 11/06/2014  Primary Care Physician: Londell Moh, MD Electrophysiologist: Hillis Range, MD  Primary Discharge Diagnosis:  Persistent atrial fibrillation status post ablation this admission  Secondary Discharge Diagnosis:  1.  NICM 2.  Obesity 3.  Hyperlipidemia 4.  Depression  Procedures This Admission:  1.  Electrophysiology study and radiofrequency catheter ablation on 11/05/14 by Dr Hillis Range.  This study demonstrated atrial fibrillation upon presentation; rotational Angiography reveals a moderate sized left atrium with 5 separate pulmonary veins (R middle PV) without evidence of pulmonary vein stenosis; return of conduction within the left superior pulmonary vein. All other veins were quiescent from a prior ablation procedure. Successful re electrical isolation and anatomical encircling of the LSPV. As the prior procedure was an antral/ segmental procedure, I elected to perform WACA over all pulmonary veins with radiofrequency current. The patient was successfully ablated to sinus rhythm today. There was transient isthmus dependant right atrial flutter also observed; cavo-tricuspid isthmus ablation was performed with complete bidirectional isthmus block achieved; no inducible arrhythmias following ablation both on and off of dobutamine; no early apparent complications.  Brief HPI: Kiara [name] is a 54 y.o. female with a history of persistent atrial fibrillation.  They have failed medical therapy with Flecainide and has had previous PVI.  She did well initially following ablation but developed recurrent atrial fibrillation. Risks, benefits, and alternatives to catheter ablation of atrial fibrillation were reviewed with the patient who wished to proceed.  The patient underwent TEE prior to the procedure  which demonstrated normal LV function and no LAA thrombus.    Hospital Course:  The patient was admitted and underwent EPS/RFCA of atrial fibrillation with details as outlined above.  They were monitored on telemetry overnight which demonstrated sinus rhythm.  Groin was without complication on the day of discharge.  The patient was examined and considered to be stable for discharge.  Wound care and restrictions were reviewed with the patient.  The patient will be seen back by Dr Johney Frame in 12 weeks for post ablation follow up.   This patients CHA2DS2-VASc Score and unadjusted Ischemic Stroke Rate (% per year) is equal to 0.6 % stroke rate/year from a score of 1 Above score calculated as 1 point each if present [CHF, HTN, DM, Vascular=MI/PAD/Aortic Plaque, Age if 4-74, or Female] Above score calculated as 2 points each if present [Age > 75, or Stroke/TIA/TE]   Physical Exam: Filed Vitals:   11/06/14 0400 11/06/14 0500 11/06/14 0600 11/06/14 0700  BP: 101/53 113/60 105/56 95/41  Pulse: 59 56 59 58  Temp:    98.5 F (36.9 C)  TempSrc:    Oral  Resp: Height:      Weight:      SpO2: 91% 91% 95% 97%    GEN- The patient is well appearing, alert and oriented x 3 today.   HEENT: normocephalic, atraumatic; sclera clear, conjunctiva pink; hearing intact; oropharynx clear; neck supple  Lungs- Clear to ausculation bilaterally, normal work of breathing.  No wheezes, rales, rhonchi Heart- Regular rate and rhythm  GI- soft, non-tender, non-distended, bowel sounds present  Extremities- no clubbing, cyanosis, or edema; DP/PT/radial pulses 2+ bilaterally, groin without hematoma/bruit MS- no significant deformity or atrophy Skin- warm and dry, no rash or lesion Psych- euthymic mood, full affect Neuro- strength and sensation are intact  Labs:   Lab Results  Component Value Date   WBC 8.4 11/06/2014   HGB 12.2 11/06/2014   HCT 36.9 11/06/2014   MCV 93.2 11/06/2014   PLT 131*  11/06/2014     Recent Labs Lab 11/06/14 0230  NA 140  K 3.4*  CL 110  CO2 25  BUN 8  CREATININE 0.52  CALCIUM 8.7*  GLUCOSE 95     Discharge Medications:    Medication List    STOP taking these medications        diltiazem 120 MG 24 hr capsule  Commonly known as:  CARDIZEM CD     flecainide 100 MG tablet  Commonly known as:  TAMBOCOR      TAKE these medications        acetaminophen 325 MG tablet  Commonly known as:  TYLENOL  Take 650-975 mg by mouth every 6 (six) hours as needed for mild pain.     apixaban 5 MG Tabs tablet  Commonly known as:  ELIQUIS  Take 1 tablet (5 mg total) by mouth 2 (two) times daily.     atorvastatin 40 MG tablet  Commonly known as:  LIPITOR  TAKE 1 TABLET BY MOUTH EVERY DAY AT 6PM     furosemide 20 MG tablet  Commonly known as:  LASIX  Take 20 mg by mouth every other day.     lisinopril 5 MG tablet  Commonly known as:  PRINIVIL,ZESTRIL  Take 1 tablet (5 mg total) by mouth daily.     metoprolol tartrate 25 MG tablet  Commonly known as:  LOPRESSOR  Take 12.5 mg by mouth daily as needed (palpitations/AFIB).     pantoprazole 40 MG tablet  Commonly known as:  PROTONIX  Take 1 tablet (40 mg total) by mouth daily.     potassium chloride 10 MEQ tablet  Commonly known as:  K-DUR,KLOR-CON  Take 10 mEq by mouth daily as needed (when taking lasix).     sertraline 100 MG tablet  Commonly known as:  ZOLOFT  Take 200 mg by mouth every morning.        Disposition:  Discharge Instructions    Diet - low sodium heart healthy    Complete by:  As directed      Increase activity slowly    Complete by:  As directed           Follow-up Information    Follow up with [facility] On 12/03/2014.   Why:  at [employer] information:   [address] [place] [phone]      Follow up with Hillis Range, MD On 02/11/2015.   Specialty:  Cardiology   Why:  at [facility] information:   [address] [place] [address] [phone]       Duration of Discharge Encounter: Greater than 30 minutes including physician time.  Signed, Gypsy Balsam, NP 11/06/2014 7:34 AM   Hillis Range MD, [facility] 11/06/2014 9:13 AM

## 2014-11-05 NOTE — Discharge Instructions (Signed)
No driving for 4 days. No lifting over 5 lbs for 1 week. No sexual activity for 1 week. You may return to work in 1 week. Keep procedure site clean & dry. If you notice increased pain, swelling, bleeding or pus, call/return!  You may shower, but no soaking baths/hot tubs/pools for 1 week.  ° ° °

## 2014-11-05 NOTE — Progress Notes (Signed)
Spoke with Dr. Onalee Hua regarding continuation of patient's home medications after ablation. Orders given to give home dose of flecainide and cardizem once this  evening and have them re-order in the morning during rounds if warranted. Will monitor.

## 2014-11-06 ENCOUNTER — Encounter (HOSPITAL_COMMUNITY): Payer: Self-pay | Admitting: Internal Medicine

## 2014-11-06 DIAGNOSIS — I428 Other cardiomyopathies: Secondary | ICD-10-CM | POA: Diagnosis not present

## 2014-11-06 DIAGNOSIS — I4892 Unspecified atrial flutter: Secondary | ICD-10-CM | POA: Diagnosis not present

## 2014-11-06 DIAGNOSIS — I481 Persistent atrial fibrillation: Secondary | ICD-10-CM | POA: Diagnosis not present

## 2014-11-06 DIAGNOSIS — E669 Obesity, unspecified: Secondary | ICD-10-CM | POA: Diagnosis not present

## 2014-11-06 LAB — BASIC METABOLIC PANEL
Anion gap: 5 (ref 5–15)
BUN: 8 mg/dL (ref 6–20)
CO2: 25 mmol/L (ref 22–32)
Calcium: 8.7 mg/dL — ABNORMAL LOW (ref 8.9–10.3)
Chloride: 110 mmol/L (ref 101–111)
Creatinine, Ser: 0.52 mg/dL (ref 0.44–1.00)
GFR calc Af Amer: >60 mL/min (ref >60)
GFR calc non Af Amer: >60 mL/min (ref >60)
Glucose, Bld: 95 mg/dL (ref 65–99)
Potassium: 3.4 mmol/L — ABNORMAL LOW (ref 3.5–5.1)
Sodium: 140 mmol/L (ref 135–145)

## 2014-11-06 LAB — CBC
HCT: 36.9 % (ref 36.0–46.0)
Hemoglobin: 12.2 g/dL (ref 12.0–15.0)
MCH: 30.8 pg (ref 26.0–34.0)
MCHC: 33.1 g/dL (ref 30.0–36.0)
MCV: 93.2 fL (ref 78.0–100.0)
Platelets: 131 10*3/uL — ABNORMAL LOW (ref 150–400)
RBC: 3.96 MIL/uL (ref 3.87–5.11)
RDW: 13.5 % (ref 11.5–15.5)
WBC: 8.4 10*3/uL (ref 4.0–10.5)

## 2014-11-06 MED ORDER — PANTOPRAZOLE SODIUM 40 MG PO TBEC: 40.0000 mg | DELAYED_RELEASE_TABLET | Freq: Every day | ORAL | Status: DC

## 2014-11-07 ENCOUNTER — Telehealth: Payer: Self-pay | Admitting: Nurse Practitioner

## 2014-11-07 NOTE — Telephone Encounter (Signed)
   Pt is s/p AF RFCA/PVI on 9/22.  She was d/c'd on 9/23 in sinus rhythm and her flecainide and dilt were d/c'd at discharge.  She is currently only taking lopressor 12.5 prn palpitations/AF.  Since 11pm on 9/23, she has been back in AF.  This has been associated with fatigue.  HR's range from the 110's to 150's.  She has taken a total of 50 mg of metoprolol up to this point w/o much impact.  She still has dilt CD 120 mg @ home.  I've recommended that provided that her SBP (she has cuff @ home and will check) is >/= 110 mmHg, she may take a dose of dilt CD 120 mg now.  If she remains tachycardic throughout the day despite taking this, and/or she becomes more symptomatic, she knows to come into the ED for evaluation.  I've advised that I will forward this note to AF clinic in hopes of getting her in sooner than what is currently scheduled.  Caller verbalized understanding and was grateful for the call back.  Nicolasa Ducking, NP 11/07/2014, 10:12 AM

## 2014-11-08 NOTE — Telephone Encounter (Signed)
Stacy,  Please call patient to follow-up. May need earlier follow-up with Lupita Leash in still in AF.

## 2014-11-09 ENCOUNTER — Encounter (HOSPITAL_COMMUNITY): Payer: Self-pay | Admitting: Internal Medicine

## 2014-11-09 NOTE — Telephone Encounter (Signed)
Talked with patient today -- states she is feeling much better today.  After taking the cardizem per Ward Givens NP request she converted back into NSR and has been ever since.  Just felt really wiped out on Sunday but rested well last night and will take it easy over next few days.  Instructed patient to call back to clinic if the episodes become more frequent as medication adjustments may need to be made during her 3 month healing process after ablation.  Patient verbalized understanding.

## 2014-11-11 ENCOUNTER — Other Ambulatory Visit: Payer: Self-pay | Admitting: Physician Assistant

## 2014-11-11 NOTE — Telephone Encounter (Signed)
Please review for refill. Thanks!  

## 2014-11-25 ENCOUNTER — Encounter: Payer: Self-pay | Admitting: Internal Medicine

## 2014-11-29 LAB — CUP PACEART REMOTE DEVICE CHECK: Date Time Interrogation Session: 20160919130534

## 2014-11-29 NOTE — Progress Notes (Signed)
Carelink summary report received. Battery status OK. Normal device function. No new symptom, tachy, brady, or pause episodes. 24 AF episodes (burden 8.1%), +Eliquis, avg V rate controlled. Plan for AF ablation on 11/05/14, monthly summary reports, and ROV with AF clinic on 12/03/14 at 10:00am.

## 2014-12-01 ENCOUNTER — Telehealth: Payer: Self-pay | Admitting: Internal Medicine

## 2014-12-01 NOTE — Telephone Encounter (Signed)
°  1. Has your device fired? No  2. Is you device beeping? No  3. Are you experiencing draining or swelling at device site? No  4. Are you calling to see if we received your device transmission? Yes, is having problem seeing if device is working properly  5. Have you passed out? No

## 2014-12-01 NOTE — Telephone Encounter (Signed)
Informed pt that a remote transmission was received 11-26-14. Pt voiced concerns about home monitor not transmitting every night automatically. Informed pt to call tech services so they can help pt trouble shoot home monitor further. Pt verbalized understanding.

## 2014-12-02 ENCOUNTER — Ambulatory Visit (INDEPENDENT_AMBULATORY_CARE_PROVIDER_SITE_OTHER): Payer: BLUE CROSS/BLUE SHIELD | Admitting: *Deleted

## 2014-12-02 DIAGNOSIS — I4891 Unspecified atrial fibrillation: Secondary | ICD-10-CM | POA: Diagnosis not present

## 2014-12-03 ENCOUNTER — Ambulatory Visit (HOSPITAL_COMMUNITY)
Admission: RE | Admit: 2014-12-03 | Discharge: 2014-12-03 | Disposition: A | Discharge status: Home / Self Care | Payer: BLUE CROSS/BLUE SHIELD | Source: Ambulatory Visit | Attending: Nurse Practitioner | Admitting: Nurse Practitioner

## 2014-12-03 ENCOUNTER — Encounter (HOSPITAL_COMMUNITY): Payer: Self-pay | Admitting: Nurse Practitioner

## 2014-12-03 ENCOUNTER — Encounter: Payer: Self-pay | Admitting: Internal Medicine

## 2014-12-03 VITALS — BP 114/72 | HR 54 | Ht 67.0 in | Wt 192.8 lb

## 2014-12-03 DIAGNOSIS — I481 Persistent atrial fibrillation: Secondary | ICD-10-CM | POA: Diagnosis present

## 2014-12-03 DIAGNOSIS — I480 Paroxysmal atrial fibrillation: Secondary | ICD-10-CM | POA: Diagnosis not present

## 2014-12-03 NOTE — Progress Notes (Signed)
LOOP RECORDER  

## 2014-12-03 NOTE — Progress Notes (Signed)
Patient ID: Kiara [name], female   DOB: 07-23-1960, 54 y.o.   MRN: [id number]     Primary Care Physician: Londell Moh, MD Referring Physician: Dr. Johney Frame   Kiara [name] is a 54 y.o. female with a h/o afib ablation x 2, 9/15 and 9/16. She has been having some episodes of afib since procedure. No issues with swallowing or rt groin issues. Takes an extra metoprolol when has PAF. Has failed amiodarone, tikosyn and flecainide in the past. In SR today.  Today, she denies symptoms of palpitations, chest pain, shortness of breath, orthopnea, PND, lower extremity edema, dizziness, presyncope, syncope, or neurologic sequela. The patient is tolerating medications without difficulties and is otherwise without complaint today.   Past Medical History  Diagnosis Date  . Asthma   . Hyperlipemia   . Obesity   . Osteopenia   . Hypothyroidism     past hx  . Depression   . Atrial fibrillation (HCC)     a. admx with AFib with RVR and acute systolic CHF 07/2013; TEE with LAA clot and no DCCV done;    . NICM (nonischemic cardiomyopathy) (HCC)     a. Echo (07/17/13):  EF 25-30%, diff HK, basal and mid anteroseptum/ basal inferoseptum and apical AK, trivial AI, mod MR, mod LAE, mild reduced RVSF, mild RAE  . Chronic systolic heart failure (HCC)   . Coronary artery disease     a.  LHC (07/17/13):  LM 20%, ostial CFX 20-30%  . Nephrolithiasis 2013    "passed them"   Past Surgical History  Procedure Laterality Date  . Appendectomy  1978  . Abdominal hysterectomy  ~ 2001  . Tee without cardioversion N/A 07/18/2013    Procedure: TRANSESOPHAGEAL ECHOCARDIOGRAM (TEE);  Surgeon: Lewayne Bunting, MD;  Location: [facility] ENDOSCOPY;  Service: Cardiovascular;  Laterality: N/A;  . Tee without cardioversion N/A 08/18/2013    Procedure: TRANSESOPHAGEAL ECHOCARDIOGRAM (TEE);  Surgeon: Donato Schultz, MD;  Location: [facility] ENDOSCOPY;  Service: Cardiovascular;  Laterality: N/A;  . Cardioversion N/A 08/18/2013    Procedure:  CARDIOVERSION;  Surgeon: Donato Schultz, MD;  Location: [facility] ENDOSCOPY;  Service: Cardiovascular;  Laterality: N/A;  . Cardiac catheterization  07/2013  . Cardioversion N/A 08/29/2013    Procedure: CARDIOVERSION;  Surgeon: Pricilla Riffle, MD;  Location: [facility] OR;  Service: Cardiovascular;  Laterality: N/A;  . Tee without cardioversion N/A 10/27/2013    Procedure: TRANSESOPHAGEAL ECHOCARDIOGRAM (TEE);  Surgeon: Pricilla Riffle, MD;  Location: [facility] ENDOSCOPY;  Service: Cardiovascular;  Laterality: N/A;  . Ablation  10/28/13    PVI by Dr Johney Frame  . Left heart catheterization with coronary angiogram N/A 07/17/2013    Procedure: LEFT HEART CATHETERIZATION WITH CORONARY ANGIOGRAM;  Surgeon: Micheline Chapman, MD;  Location: [facility] CATH LAB;  Service: Cardiovascular;  Laterality: N/A;  . Atrial fibrillation ablation N/A 10/28/2013    Procedure: ATRIAL FIBRILLATION ABLATION;  Surgeon: Gardiner Rhyme, MD;  Location: [facility] CATH LAB;  Service: Cardiovascular;  Laterality: N/A;  . Loop recorder implant N/A 06/05/2014    Procedure: LOOP RECORDER IMPLANT;  Surgeon: Hillis Range, MD;  Location: [facility] CATH LAB;  Service: Cardiovascular;  Laterality: N/A;  . Tee without cardioversion N/A 11/04/2014    Procedure: TRANSESOPHAGEAL ECHOCARDIOGRAM (TEE);  Surgeon: Chrystie Nose, MD;  Location: [facility] ENDOSCOPY;  Service: Cardiovascular;  Laterality: N/A;  . Electrophysiologic study N/A 11/05/2014    Procedure: Atrial Fibrillation Ablation;  Surgeon: Hillis Range, MD;  Location: [facility] INVASIVE CV LAB;  Service: Cardiovascular;  Laterality: N/A;    Current Outpatient Prescriptions  Medication Sig Dispense Refill  . pantoprazole (PROTONIX) 40 MG tablet Take 1 tablet (40 mg total) by mouth daily. 45 tablet 0  . potassium chloride (K-DUR,KLOR-CON) 10 MEQ tablet Take 10 mEq by mouth daily as needed (when taking lasix).     Marland Kitchen sertraline (ZOLOFT) 100 MG tablet Take 200 mg by mouth every morning.     Marland Kitchen acetaminophen (TYLENOL) 325 MG tablet Take 650-975 mg by  mouth every 6 (six) hours as needed for mild pain.     Marland Kitchen apixaban (ELIQUIS) 5 MG TABS tablet Take 1 tablet (5 mg total) by mouth 2 (two) times daily. 60 tablet 6  . atorvastatin (LIPITOR) 40 MG tablet TAKE 1 TABLET BY MOUTH EVERY DAY AT 6PM 30 tablet 6  . furosemide (LASIX) 20 MG tablet Take 20 mg by mouth every other day.     . lisinopril (PRINIVIL,ZESTRIL) 5 MG tablet Take 1 tablet (5 mg total) by mouth daily. 30 tablet 6  . metoprolol tartrate (LOPRESSOR) 25 MG tablet Take 12.5 mg by mouth daily as needed (palpitations/AFIB).     No current facility-administered medications for this encounter.    Allergies  Allergen Reactions  . Azithromycin Diarrhea and Nausea And Vomiting    Social History   Social History  . Marital Status: Married    Spouse Name: N/A  . Number of Children: N/A  . Years of Education: N/A   Occupational History  . Not on file.   Social History Main Topics  . Smoking status: Never Smoker   . Smokeless tobacco: Never Used  . Alcohol Use: No  . Drug Use: No  . Sexual Activity: Yes   Other Topics Concern  . Not on file   Social History Narrative    Family History  Problem Relation Age of Onset  . Hypertension Mother   . Lymphoma Mother   . Hypertension Father     ROS- All systems are reviewed and negative except as per the HPI above  Physical Exam: Filed Vitals:   12/03/14 1023  BP: 114/72  Pulse: 54  Height:  (1.702 m)  Weight: 192 lb 12.8 oz (87.454 kg)    GEN- The patient is well appearing, alert and oriented x 3 today.   Head- normocephalic, atraumatic Eyes-  Sclera clear, conjunctiva pink Ears- hearing intact Oropharynx- clear Neck- supple, no JVP Lymph- no cervical lymphadenopathy Lungs- Clear to ausculation bilaterally, normal work of breathing Heart- Regular rate and rhythm, no murmurs, rubs or gallops, PMI not laterally displaced GI- soft, NT, ND, + BS Extremities- no clubbing, cyanosis, or edema MS- no significant  deformity or atrophy Skin- no rash or lesion Psych- euthymic mood, full affect Neuro- strength and sensation are intact  EKG- S brady at 54 bpm, , pr int 154 ms, qrs int, 80 ms, qtc 415 ms Epic records reviewed   Assessment and Plan: 1. Persistent afib S/p ablation x2, last one 9/16 Continue to use extra metoprolol if needed for breakthrough afib Continue apixaban  F/u with Dr. Johney Frame as scheduled in December Afib clinic as needed  Elvina Sidle. [facility] [address] [place], [place] [address] [phone]

## 2014-12-22 LAB — CUP PACEART REMOTE DEVICE CHECK: Date Time Interrogation Session: 20161019130737

## 2014-12-22 NOTE — Progress Notes (Signed)
Carelink summary report received. Battery status OK. Normal device function. No new symptom or brady episodes. 2 pause episodes, longest 7 sec, patient undergoing AF ablation at the time of the episodes. 3 tachy episodes--all AF w/RVR. 63 AF episodes (burden 8.5%), +Eliquis and metoprolol, avg V rate poorly controlled. Monthly summary reports and ROV with JA on 02/11/15 at 10:00am.

## 2014-12-25 ENCOUNTER — Other Ambulatory Visit: Payer: Self-pay | Admitting: Internal Medicine

## 2014-12-28 ENCOUNTER — Other Ambulatory Visit: Payer: Self-pay | Admitting: *Deleted

## 2014-12-28 MED ORDER — FUROSEMIDE 20 MG PO TABS: 20.0000 mg | ORAL_TABLET | ORAL | Status: DC

## 2015-01-01 ENCOUNTER — Ambulatory Visit (INDEPENDENT_AMBULATORY_CARE_PROVIDER_SITE_OTHER): Payer: BLUE CROSS/BLUE SHIELD | Admitting: *Deleted

## 2015-01-01 DIAGNOSIS — I480 Paroxysmal atrial fibrillation: Secondary | ICD-10-CM | POA: Diagnosis not present

## 2015-01-01 NOTE — Progress Notes (Signed)
Carelink Summary Report / Loop Recorder 

## 2015-01-24 ENCOUNTER — Other Ambulatory Visit: Payer: Self-pay | Admitting: Internal Medicine

## 2015-02-01 ENCOUNTER — Ambulatory Visit (INDEPENDENT_AMBULATORY_CARE_PROVIDER_SITE_OTHER): Payer: BLUE CROSS/BLUE SHIELD | Admitting: *Deleted

## 2015-02-01 DIAGNOSIS — I480 Paroxysmal atrial fibrillation: Secondary | ICD-10-CM

## 2015-02-01 LAB — CUP PACEART REMOTE DEVICE CHECK: Date Time Interrogation Session: 20161118133557

## 2015-02-02 NOTE — Progress Notes (Signed)
Carelink Summary Report / Loop Recorder 

## 2015-02-04 ENCOUNTER — Telehealth: Payer: Self-pay | Admitting: Internal Medicine

## 2015-02-04 NOTE — Telephone Encounter (Signed)
New Message    Pt calling because she has a head cold and she wants to know what over the counter drugs can she take

## 2015-02-04 NOTE — Telephone Encounter (Signed)
I called and spoke with the patient. I advised she could try plain claritin/ mucinex, coricidin, and nasal saline spray. She voices understanding.

## 2015-02-11 ENCOUNTER — Ambulatory Visit (INDEPENDENT_AMBULATORY_CARE_PROVIDER_SITE_OTHER): Payer: BLUE CROSS/BLUE SHIELD | Admitting: Internal Medicine

## 2015-02-11 ENCOUNTER — Encounter: Payer: Self-pay | Admitting: Internal Medicine

## 2015-02-11 VITALS — BP 124/74 | HR 70 | Ht 67.0 in

## 2015-02-11 DIAGNOSIS — I480 Paroxysmal atrial fibrillation: Secondary | ICD-10-CM

## 2015-02-11 DIAGNOSIS — I428 Other cardiomyopathies: Secondary | ICD-10-CM

## 2015-02-11 DIAGNOSIS — I429 Cardiomyopathy, unspecified: Secondary | ICD-10-CM

## 2015-02-11 LAB — CUP PACEART INCLINIC DEVICE CHECK: Date Time Interrogation Session: 20161229135009

## 2015-02-11 MED ORDER — METOPROLOL TARTRATE 25 MG PO TABS: ORAL_TABLET | ORAL | Status: DC

## 2015-02-11 NOTE — Patient Instructions (Signed)
Medication Instructions:  Your physician has recommended you make the following change in your medication:  1) Start taking the Metoprolol 12.5 mg twice daily and as needed   Labwork: None ordered   Testing/Procedures: None ordered   Follow-Up: Your physician recommends that you schedule a follow-up appointment in: 3 months with Dr Johney Frame   Any Other Special Instructions Will Be Listed Below (If Applicable).     If you need a refill on your cardiac medications before your next appointment, please call your pharmacy.

## 2015-02-13 NOTE — Progress Notes (Signed)
Electrophysiology Office Note   Date:  02/13/2015   ID:  Kiara, [name] 1960/10/01, MRN [id number]  PCP:  Londell Moh, MD   Primary Electrophysiologist: Hillis Range, MD    Chief Complaint  Patient presents with  . PAF  . NICM  . AFIB w/RVR     History of Present Illness: Kiara [name] is a 54 y.o. female who presents today for electrophysiology evaluation.   Doing reasonably well s/p ablation.  Continues to have some afib though overall burden seems improved.  Episodes seem worse with exercise and leave patient feeling "washed out" afterwards. Today, she denies symptoms of palpitations, chest pain, shortness of breath, orthopnea, PND, lower extremity edema, claudication, dizziness, presyncope, syncope, bleeding, or neurologic sequela. The patient is tolerating medications without difficulties and is otherwise without complaint today.    Past Medical History  Diagnosis Date  . Asthma   . Hyperlipemia   . Obesity   . Osteopenia   . Hypothyroidism     past hx  . Depression   . Atrial fibrillation (HCC)     a. admx with AFib with RVR and acute systolic CHF 07/2013; TEE with LAA clot and no DCCV done;    . NICM (nonischemic cardiomyopathy) (HCC)     a. Echo (07/17/13):  EF 25-30%, diff HK, basal and mid anteroseptum/ basal inferoseptum and apical AK, trivial AI, mod MR, mod LAE, mild reduced RVSF, mild RAE  . Chronic systolic heart failure (HCC)   . Coronary artery disease     a.  LHC (07/17/13):  LM 20%, ostial CFX 20-30%  . Nephrolithiasis 2013    "passed them"   Past Surgical History  Procedure Laterality Date  . Appendectomy  1978  . Abdominal hysterectomy  ~ 2001  . Tee without cardioversion N/A 07/18/2013    Procedure: TRANSESOPHAGEAL ECHOCARDIOGRAM (TEE);  Surgeon: Lewayne Bunting, MD;  Location: [facility] ENDOSCOPY;  Service: Cardiovascular;  Laterality: N/A;  . Tee without cardioversion N/A 08/18/2013    Procedure: TRANSESOPHAGEAL ECHOCARDIOGRAM (TEE);   Surgeon: Donato Schultz, MD;  Location: [facility] ENDOSCOPY;  Service: Cardiovascular;  Laterality: N/A;  . Cardioversion N/A 08/18/2013    Procedure: CARDIOVERSION;  Surgeon: Donato Schultz, MD;  Location: [facility] ENDOSCOPY;  Service: Cardiovascular;  Laterality: N/A;  . Cardiac catheterization  07/2013  . Cardioversion N/A 08/29/2013    Procedure: CARDIOVERSION;  Surgeon: Pricilla Riffle, MD;  Location: [facility] OR;  Service: Cardiovascular;  Laterality: N/A;  . Tee without cardioversion N/A 10/27/2013    Procedure: TRANSESOPHAGEAL ECHOCARDIOGRAM (TEE);  Surgeon: Pricilla Riffle, MD;  Location: [facility] ENDOSCOPY;  Service: Cardiovascular;  Laterality: N/A;  . Ablation  10/28/13    PVI by Dr Johney Frame  . Left heart catheterization with coronary angiogram N/A 07/17/2013    Procedure: LEFT HEART CATHETERIZATION WITH CORONARY ANGIOGRAM;  Surgeon: Micheline Chapman, MD;  Location: [facility] CATH LAB;  Service: Cardiovascular;  Laterality: N/A;  . Atrial fibrillation ablation N/A 10/28/2013    Procedure: ATRIAL FIBRILLATION ABLATION;  Surgeon: Gardiner Rhyme, MD;  Location: [facility] CATH LAB;  Service: Cardiovascular;  Laterality: N/A;  . Loop recorder implant N/A 06/05/2014    Procedure: LOOP RECORDER IMPLANT;  Surgeon: Hillis Range, MD;  Location: [facility] CATH LAB;  Service: Cardiovascular;  Laterality: N/A;  . Tee without cardioversion N/A 11/04/2014    Procedure: TRANSESOPHAGEAL ECHOCARDIOGRAM (TEE);  Surgeon: Chrystie Nose, MD;  Location: [facility] ENDOSCOPY;  Service: Cardiovascular;  Laterality: N/A;  . Electrophysiologic study N/A 11/05/2014  Procedure: Atrial Fibrillation Ablation;  Surgeon: Hillis Range, MD;  Location: [facility] INVASIVE CV LAB;  Service: Cardiovascular;  Laterality: N/A;     Current Outpatient Prescriptions  Medication Sig Dispense Refill  . sertraline (ZOLOFT) 100 MG tablet Take 200 mg by mouth every morning.     Marland Kitchen acetaminophen (TYLENOL) 325 MG tablet Take 650-975 mg by mouth every 6 (six) hours as needed for mild pain.     Marland Kitchen apixaban  (ELIQUIS) 5 MG TABS tablet Take 1 tablet (5 mg total) by mouth 2 (two) times daily. 60 tablet 6  . atorvastatin (LIPITOR) 40 MG tablet TAKE 1 TABLET BY MOUTH EVERY DAY AT 6PM 30 tablet 6  . furosemide (LASIX) 20 MG tablet Take 1 tablet (20 mg total) by mouth every other day. 45 tablet 1  . lisinopril (PRINIVIL,ZESTRIL) 5 MG tablet Take 1 tablet (5 mg total) by mouth daily. 30 tablet 6  . metoprolol tartrate (LOPRESSOR) 25 MG tablet Take 1/2 tablet by mouth twice daily and as needed 225 tablet 1  . pantoprazole (PROTONIX) 40 MG tablet Take 1 tablet (40 mg total) by mouth daily. 45 tablet 0  . potassium chloride (K-DUR,KLOR-CON) 10 MEQ tablet Take 10 mEq by mouth every other day. When taking Lasix     No current facility-administered medications for this visit.    Allergies:   Azithromycin   Social History:  The patient  reports that she has never smoked. She has never used smokeless tobacco. She reports that she does not drink alcohol or use illicit drugs.   Family History:  The patient's  family history includes Hypertension in her father and mother; Lymphoma in her mother.    ROS:  Please see the history of present illness.   All other systems are reviewed and negative.    PHYSICAL EXAM: VS:  BP 124/74 mmHg  Pulse 70  Ht  (1.702 m) , BMI There is no weight on file to calculate BMI. GEN: Well nourished, well developed, in no acute distress HEENT: normal Neck: no JVD, carotid bruits, or masses Cardiac: RRR; no murmurs, rubs, or gallops,no edema  Respiratory:  clear to auscultation bilaterally, normal work of breathing GI: soft, nontender, nondistended, + BS MS: no deformity or atrophy Skin: warm and dry  Neuro:  Strength and sensation are intact Psych: euthymic mood, full affect  ILR interrogation today is reviewed- AF burden 6%, no other arrhythmias   Recent Labs: 11/06/2014: BUN 8; Creatinine, Ser 0.52; Hemoglobin 12.2; Platelets 131*; Potassium 3.4*; Sodium 140     Lipid Panel     Component Value Date/Time   CHOL 198 07/17/2013 0720   TRIG 69 07/17/2013 0720   HDL 59 07/17/2013 0720   CHOLHDL 3.4 07/17/2013 0720   VLDL 14 07/17/2013 0720   LDLCALC 125* 07/17/2013 0720     Wt Readings from Last 3 Encounters:  12/03/14 192 lb 12.8 oz (87.454 kg)  11/05/14 191 lb (86.637 kg)  11/04/14 190 lb (86.183 kg)      ASSESSMENT AND PLAN:  1.  Persistent afib Appears to be improved post ablation though we are just now at the end of the 3 month healing phase Would start twice daily metoprolol and follow on ILR Could try flecainide in the future.  She does not feel that tikosyn provided her with any benefit previously.  2. Nonischemic CM EF has previously normalized We should try to keep in sinus long term.  Return to see me in 2-3 months  Current  medicines are reviewed at length with the patient today.   The patient does not have concerns regarding her medicines.  The following changes were made today:  none    Signed, Hillis Range, MD  02/13/2015 9:27 AM     [facility] [address] [place] [address] [phone] (office) [phone] (fax)

## 2015-02-26 ENCOUNTER — Telehealth: Payer: Self-pay | Admitting: Internal Medicine

## 2015-02-26 NOTE — Telephone Encounter (Signed)
Calling stating that she hurt her back and her PCP at [facility] gave her Meloxicam.  States she is on Eliquis and wanted to know if she could take the Meloxicam with Eliquis.  Advised she could take but would recommend that she take for a short time no more than 3-4 days. (per Audrie Lia, pharmacist).  She states they Rx the medication for 2 wks and she knows that it wouldn't take that long for it to help her back.  She verbalizes understanding.

## 2015-02-26 NOTE — Telephone Encounter (Signed)
New message  Pt c/o medication issue: 1. Name of Medication: Meloxicam 7.5 Mg   4. What is your medication issue? Received the medication from [facility].  Pt is on Eliquis and request a call back to discuss if the pt can  take the anti inflammatory for back pain. Please call back to discuss.

## 2015-03-02 ENCOUNTER — Ambulatory Visit (INDEPENDENT_AMBULATORY_CARE_PROVIDER_SITE_OTHER): Payer: BLUE CROSS/BLUE SHIELD | Admitting: *Deleted

## 2015-03-02 DIAGNOSIS — I480 Paroxysmal atrial fibrillation: Secondary | ICD-10-CM

## 2015-03-02 NOTE — Progress Notes (Signed)
Carelink Summary Report / Loop Recorder 

## 2015-03-20 LAB — CUP PACEART REMOTE DEVICE CHECK: Date Time Interrogation Session: 20161218133856

## 2015-04-01 ENCOUNTER — Ambulatory Visit (INDEPENDENT_AMBULATORY_CARE_PROVIDER_SITE_OTHER): Payer: BLUE CROSS/BLUE SHIELD | Admitting: *Deleted

## 2015-04-01 DIAGNOSIS — I481 Persistent atrial fibrillation: Secondary | ICD-10-CM | POA: Diagnosis not present

## 2015-04-01 DIAGNOSIS — I4819 Other persistent atrial fibrillation: Secondary | ICD-10-CM

## 2015-04-01 NOTE — Progress Notes (Signed)
Carelink Summary Report / Loop Recorder 

## 2015-04-04 ENCOUNTER — Other Ambulatory Visit: Payer: Self-pay | Admitting: Cardiology

## 2015-04-05 ENCOUNTER — Encounter: Payer: Self-pay | Admitting: Internal Medicine

## 2015-04-05 NOTE — Telephone Encounter (Signed)
REFILL 

## 2015-04-16 ENCOUNTER — Other Ambulatory Visit: Payer: Self-pay | Admitting: *Deleted

## 2015-04-16 MED ORDER — METOPROLOL TARTRATE 25 MG PO TABS: ORAL_TABLET | ORAL | Status: DC

## 2015-04-16 MED ORDER — LISINOPRIL 5 MG PO TABS: 5.0000 mg | ORAL_TABLET | Freq: Every day | ORAL | Status: DC

## 2015-04-16 MED ORDER — APIXABAN 5 MG PO TABS: 5.0000 mg | ORAL_TABLET | Freq: Two times a day (BID) | ORAL | Status: DC

## 2015-04-16 MED ORDER — POTASSIUM CHLORIDE CRYS ER 10 MEQ PO TBCR: 10.0000 meq | EXTENDED_RELEASE_TABLET | ORAL | Status: DC

## 2015-04-16 MED ORDER — ATORVASTATIN CALCIUM 40 MG PO TABS: ORAL_TABLET | ORAL | Status: DC

## 2015-04-16 MED ORDER — FUROSEMIDE 20 MG PO TABS: 20.0000 mg | ORAL_TABLET | ORAL | Status: DC

## 2015-04-22 LAB — CUP PACEART REMOTE DEVICE CHECK: Date Time Interrogation Session: 20170117133646

## 2015-05-03 ENCOUNTER — Ambulatory Visit (INDEPENDENT_AMBULATORY_CARE_PROVIDER_SITE_OTHER): Payer: BLUE CROSS/BLUE SHIELD | Admitting: *Deleted

## 2015-05-03 DIAGNOSIS — I480 Paroxysmal atrial fibrillation: Secondary | ICD-10-CM

## 2015-05-04 NOTE — Progress Notes (Signed)
Carelink Summary Report / Loop Recorder 

## 2015-05-12 ENCOUNTER — Encounter: Payer: BLUE CROSS/BLUE SHIELD | Admitting: Internal Medicine

## 2015-05-31 ENCOUNTER — Ambulatory Visit (INDEPENDENT_AMBULATORY_CARE_PROVIDER_SITE_OTHER): Payer: BLUE CROSS/BLUE SHIELD | Admitting: *Deleted

## 2015-05-31 DIAGNOSIS — I480 Paroxysmal atrial fibrillation: Secondary | ICD-10-CM | POA: Diagnosis not present

## 2015-05-31 NOTE — Progress Notes (Signed)
Carelink Summary Report / Loop Recorder 

## 2015-06-07 LAB — CUP PACEART REMOTE DEVICE CHECK: Date Time Interrogation Session: 20170216140902

## 2015-06-07 NOTE — Progress Notes (Signed)
Carelink summary report received. Battery status OK. Normal device function. No new symptom episodes, tachy episodes, brady, or pause episodes. 17 AF 2.9% +Eliquis. Good histograms. Monthly summary reports and ROV/PRN

## 2015-06-30 ENCOUNTER — Ambulatory Visit (INDEPENDENT_AMBULATORY_CARE_PROVIDER_SITE_OTHER): Payer: BLUE CROSS/BLUE SHIELD | Admitting: *Deleted

## 2015-06-30 DIAGNOSIS — I480 Paroxysmal atrial fibrillation: Secondary | ICD-10-CM

## 2015-06-30 NOTE — Progress Notes (Signed)
Carelink Summary Report / Loop Recorder 

## 2015-07-10 LAB — CUP PACEART REMOTE DEVICE CHECK: Date Time Interrogation Session: 20170318143556

## 2015-07-10 NOTE — Progress Notes (Signed)
Carelink summary report received. Battery status OK. Normal device function. No new symptom episodes, brady, or pause episodes. 0.9% AF burden, v rates elevated. +Eliquis. 3 tachy episodes are AF with RVR.  Monthly summary reports and ROV/PRN

## 2015-07-12 LAB — CUP PACEART REMOTE DEVICE CHECK: Date Time Interrogation Session: 20170417143645

## 2015-07-12 NOTE — Progress Notes (Signed)
Carelink summary report received. Battery status OK. Normal device function. No new symptom episodes, tachy episodes, brady, or pause episodes. 3.9% AF, v rates elevated, +Eliquis, Metoprolol 25mg  bid.  Monthly summary reports and ROV/PRN

## 2015-07-14 ENCOUNTER — Other Ambulatory Visit: Payer: Self-pay

## 2015-07-14 ENCOUNTER — Ambulatory Visit (INDEPENDENT_AMBULATORY_CARE_PROVIDER_SITE_OTHER): Payer: BLUE CROSS/BLUE SHIELD | Admitting: Internal Medicine

## 2015-07-14 ENCOUNTER — Encounter: Payer: Self-pay | Admitting: Internal Medicine

## 2015-07-14 VITALS — BP 110/72 | HR 45 | Ht 67.0 in | Wt 186.0 lb

## 2015-07-14 DIAGNOSIS — Z79899 Other long term (current) drug therapy: Secondary | ICD-10-CM

## 2015-07-14 DIAGNOSIS — I480 Paroxysmal atrial fibrillation: Secondary | ICD-10-CM | POA: Diagnosis not present

## 2015-07-14 MED ORDER — FLECAINIDE ACETATE 50 MG PO TABS: 50.0000 mg | ORAL_TABLET | Freq: Two times a day (BID) | ORAL | Status: DC

## 2015-07-14 NOTE — Patient Instructions (Addendum)
Medication Instructions:  Your physician has recommended you make the following change in your medication:  1) CHANGE the way you take Metoprolol -- take it daily as needed for AFib 2) START Flecainide 50 mg twice a day  Labwork: None ordered  Testing/Procedures: None  ordered  Follow-Up: Your physician recommends that you schedule a follow-up appointment in: 3 months with Dr. Johney Frame  If you need a refill on your cardiac medications before your next appointment, please call your pharmacy.  Thank you for choosing [facility]!!    Any Other Special Instructions Will Be Listed Below (If Applicable). Flecainide tablets What is this medicine? FLECAINIDE (FLEK a nide) is an antiarrhythmic drug. This medicine is used to prevent irregular heart rhythm. It can also slow down fast heartbeats called tachycardia. This medicine may be used for other purposes; ask your health care provider or pharmacist if you have questions. What should I tell my health care provider before I take this medicine? They need to know if you have any of these conditions: -abnormal levels of potassium in the blood -heart disease including heart rhythm and heart rate problems -kidney or liver disease -recent heart attack -an unusual or allergic reaction to flecainide, local anesthetics, other medicines, foods, dyes, or preservatives -pregnant or trying to get pregnant -breast-feeding How should I use this medicine? Take this medicine by mouth with a glass of water. Follow the directions on the prescription label. You can take this medicine with or without food. Take your doses at regular intervals. Do not take your medicine more often than directed. Do not stop taking this medicine suddenly. This may cause serious, heart-related side effects. If your doctor wants you to stop the medicine, the dose may be slowly lowered over time to avoid any side effects. Talk to your pediatrician regarding the use of this  medicine in children. While this drug may be prescribed for children as young as 1 year of age for selected conditions, precautions do apply. Overdosage: If you think you have taken too much of this medicine contact a poison control center or emergency room at once. NOTE: This medicine is only for you. Do not share this medicine with others. What if I miss a dose? If you miss a dose, take it as soon as you can. If it is almost time for your next dose, take only that dose. Do not take double or extra doses. What may interact with this medicine? Do not take this medicine with any of the following medications: -amoxapine -arsenic trioxide -certain antibiotics like clarithromycin, erythromycin, gatifloxacin, gemifloxacin, levofloxacin, moxifloxacin, sparfloxacin, or troleandomycin -certain antidepressants called tricyclic antidepressants like amitriptyline, imipramine, or nortriptyline -certain medicines to control heart rhythm like disopyramide, dofetilide, encainide, moricizine, procainamide, propafenone, and quinidine -cisapride -cyclobenzaprine -delavirdine -droperidol -haloperidol -hawthorn -imatinib -levomethadyl -maprotiline -medicines for malaria like chloroquine and halofantrine -pentamidine -phenothiazines like chlorpromazine, mesoridazine, prochlorperazine, thioridazine -pimozide -quinine -ranolazine -ritonavir -sertindole -ziprasidone This medicine may also interact with the following medications: -cimetidine -medicines for angina or high blood pressure -medicines to control heart rhythm like amiodarone and digoxin This list may not describe all possible interactions. Give your health care provider a list of all the medicines, herbs, non-prescription drugs, or dietary supplements you use. Also tell them if you smoke, drink alcohol, or use illegal drugs. Some items may interact with your medicine. What should I watch for while using this medicine? Visit your doctor or  health care professional for regular checks on your progress. Because your condition and the  use of this medicine carries some risk, it is a good idea to carry an identification card, necklace or bracelet with details of your condition, medications and doctor or health care professional. Check your blood pressure and pulse rate regularly. Ask your health care professional what your blood pressure and pulse rate should be, and when you should contact him or her. Your doctor or health care professional also may schedule regular blood tests and electrocardiograms to check your progress. You may get drowsy or dizzy. Do not drive, use machinery, or do anything that needs mental alertness until you know how this medicine affects you. Do not stand or sit up quickly, especially if you are an older patient. This reduces the risk of dizzy or fainting spells. Alcohol can make you more dizzy, increase flushing and rapid heartbeats. Avoid alcoholic drinks. What side effects may I notice from receiving this medicine? Side effects that you should report to your doctor or health care professional as soon as possible: -chest pain, continued irregular heartbeats -difficulty breathing -swelling of the legs or feet -trembling, shaking -unusually weak or tired Side effects that usually do not require medical attention (report to your doctor or health care professional if they continue or are bothersome): -blurred vision -constipation -headache -nausea, vomiting -stomach pain This list may not describe all possible side effects. Call your doctor for medical advice about side effects. You may report side effects to FDA at 1-800-FDA-1088. Where should I keep my medicine? Keep out of the reach of children. Store at room temperature between 15 and 30 degrees C (59 and 86 degrees F). Protect from light. Keep container tightly closed. Throw away any unused medicine after the expiration date. NOTE: This sheet is a summary.  It may not cover all possible information. If you have questions about this medicine, talk to your doctor, pharmacist, or health care provider.    2016, Elsevier/Gold Standard. (2007-06-05 16:46:09)

## 2015-07-14 NOTE — Progress Notes (Signed)
Electrophysiology Office Note   Date:  07/14/2015   ID:  Kiara, [name] [birth year], MRN [id number]  PCP:  Kiara Moh, MD   Primary Electrophysiologist: Kiara Range, MD    Chief Complaint  Patient presents with  . Atrial Fibrillation     History of Present Illness: Kiara [name] is a 55 y.o. female who presents today for electrophysiology evaluation.   Doing reasonably well s/p ablation.  AF burden is 2.4% but improved from last visit.  She has fatigue with metoprolol.  She is not exercising due to concerns of afib.  Denies CHF symptoms.  Daughter Kiara [name] is leaving for [place] this week for 8 weeks).  Husband is also going to [place] for 10 days.  She has concerns regarding these issues as well as her house which has not sold.  Today, she denies symptoms of palpitations, chest pain, shortness of breath, orthopnea, PND, lower extremity edema, claudication, dizziness, presyncope, syncope, bleeding, or neurologic sequela. The patient is tolerating medications without difficulties and is otherwise without complaint today.    Past Medical History  Diagnosis Date  . Asthma   . Hyperlipemia   . Obesity   . Osteopenia   . Hypothyroidism     past hx  . Depression   . Atrial fibrillation (HCC)     a. admx with AFib with RVR and acute systolic CHF 07/2013; TEE with LAA clot and no DCCV done;    . NICM (nonischemic cardiomyopathy) (HCC)     a. Echo (07/17/13):  EF 25-30%, diff HK, basal and mid anteroseptum/ basal inferoseptum and apical AK, trivial AI, mod MR, mod LAE, mild reduced RVSF, mild RAE  . Chronic systolic heart failure (HCC)   . Coronary artery disease     a.  LHC (07/17/13):  LM 20%, ostial CFX 20-30%  . Nephrolithiasis 2013    "passed them"   Past Surgical History  Procedure Laterality Date  . Appendectomy  1978  . Abdominal hysterectomy  ~ 2001  . Tee without cardioversion N/A 07/18/2013    Procedure: TRANSESOPHAGEAL ECHOCARDIOGRAM (TEE);  Surgeon: Kiara Bunting, MD;  Location: [facility] ENDOSCOPY;  Service: Cardiovascular;  Laterality: N/A;  . Tee without cardioversion N/A 08/18/2013    Procedure: TRANSESOPHAGEAL ECHOCARDIOGRAM (TEE);  Surgeon: Kiara Schultz, MD;  Location: [facility] ENDOSCOPY;  Service: Cardiovascular;  Laterality: N/A;  . Cardioversion N/A 08/18/2013    Procedure: CARDIOVERSION;  Surgeon: Kiara Schultz, MD;  Location: [facility] ENDOSCOPY;  Service: Cardiovascular;  Laterality: N/A;  . Cardiac catheterization  07/2013  . Cardioversion N/A 08/29/2013    Procedure: CARDIOVERSION;  Surgeon: Kiara Riffle, MD;  Location: [facility] OR;  Service: Cardiovascular;  Laterality: N/A;  . Tee without cardioversion N/A 10/27/2013    Procedure: TRANSESOPHAGEAL ECHOCARDIOGRAM (TEE);  Surgeon: Kiara Riffle, MD;  Location: [facility] ENDOSCOPY;  Service: Cardiovascular;  Laterality: N/A;  . Ablation  10/28/13    PVI by Dr Johney [name]  . Left heart catheterization with coronary angiogram N/A 07/17/2013    Procedure: LEFT HEART CATHETERIZATION WITH CORONARY ANGIOGRAM;  Surgeon: Kiara Chapman, MD;  Location: [facility] CATH LAB;  Service: Cardiovascular;  Laterality: N/A;  . Atrial fibrillation ablation N/A 10/28/2013    Procedure: ATRIAL FIBRILLATION ABLATION;  Surgeon: Kiara Rhyme, MD;  Location: [facility] CATH LAB;  Service: Cardiovascular;  Laterality: N/A;  . Loop recorder implant N/A 06/05/2014    Procedure: LOOP RECORDER IMPLANT;  Surgeon: Kiara Range, MD;  Location: [facility] CATH LAB;  Service: Cardiovascular;  Laterality:  N/A;  Kiara [name] without cardioversion N/A 11/04/2014    Procedure: TRANSESOPHAGEAL ECHOCARDIOGRAM (TEE);  Surgeon: Kiara Nose, MD;  Location: [facility] ENDOSCOPY;  Service: Cardiovascular;  Laterality: N/A;  . Electrophysiologic study N/A 11/05/2014    Procedure: Atrial Fibrillation Ablation;  Surgeon: Kiara Range, MD;  Location: [facility] INVASIVE CV LAB;  Service: Cardiovascular;  Laterality: N/A;     Current Outpatient Prescriptions  Medication Sig Dispense Refill  . acetaminophen (TYLENOL) 325  MG tablet Take 650-975 mg by mouth every 6 (six) hours as needed for mild pain.     Marland Kitchen apixaban (ELIQUIS) 5 MG TABS tablet Take 1 tablet (5 mg total) by mouth 2 (two) times daily. 180 tablet 2  . atorvastatin (LIPITOR) 40 MG tablet TAKE 1 TABLET BY MOUTH EVERY DAY AT 6 PM 90 tablet 2  . furosemide (LASIX) 20 MG tablet Take 20 mg by mouth 5 days a week    . lisinopril (PRINIVIL,ZESTRIL) 5 MG tablet Take 1 tablet (5 mg total) by mouth daily. 90 tablet 2  . metoprolol tartrate (LOPRESSOR) 25 MG tablet Take 12.5 mg by mouth 2 (two) times daily. Take extra 1/2-1 tablet daily as needed for AFIB    . potassium chloride (K-DUR,KLOR-CON) 10 MEQ tablet Take 1 tablet (10 mEq total) by mouth every other day. When taking Lasix 45 tablet 2  . sertraline (ZOLOFT) 100 MG tablet Take 200 mg by mouth every morning.      No current facility-administered medications for this visit.    Allergies:   Azithromycin   Social History:  The patient  reports that she has never smoked. She has never used smokeless tobacco. She reports that she does not drink alcohol or use illicit drugs.   Family History:  The patient's  family history includes Hypertension in her father and mother; Lymphoma in her mother.    ROS:  Please see the history of present illness.   All other systems are reviewed and negative.    PHYSICAL EXAM: VS:  BP 110/72 mmHg  Pulse 45  Ht  (1.702 m)  Wt 186 lb (84.369 kg)  BMI 29.12 kg/m2 , BMI Body mass index is 29.12 kg/(m^2). GEN: Well nourished, well developed, in no acute distress HEENT: normal Neck: no JVD, carotid bruits, or masses Cardiac: RRR; no murmurs, rubs, or gallops,no edema  Respiratory:  clear to auscultation bilaterally, normal work of breathing GI: soft, nontender, nondistended, + BS MS: no deformity or atrophy Skin: warm and dry  Neuro:  Strength and sensation are intact Psych: euthymic mood, full affect  ILR interrogation today is reviewed- AF burden 2.4% (previously  6%), no other arrhythmias   Recent Labs: 11/06/2014: BUN 8; Creatinine, Ser 0.52; Hemoglobin 12.2; Platelets 131*; Potassium 3.4*; Sodium 140    Lipid Panel     Component Value Date/Time   CHOL 198 07/17/2013 0720   TRIG 69 07/17/2013 0720   HDL 59 07/17/2013 0720   CHOLHDL 3.4 07/17/2013 0720   VLDL 14 07/17/2013 0720   LDLCALC 125* 07/17/2013 0720     Wt Readings from Last 3 Encounters:  07/14/15 186 lb (84.369 kg)  12/03/14 192 lb 12.8 oz (87.454 kg)  11/05/14 191 lb (86.637 kg)      ASSESSMENT AND PLAN:  1.  Persistent afib Appears to be improved post ablation though not resolved Will continue eliquis Stop metoprolol due to fatigue.  She can take prn. Add flecainide  BID. Consider ETT upon return if tolerating this.  2. Nonischemic  CM EF has previously normalized We should try to keep in sinus long term.  Return to see me in 3 months  Current medicines are reviewed at length with the patient today.   The patient does not have concerns regarding her medicines.  The following changes were made today:  none    Signed, Kiara Range, MD  07/14/2015 8:34 AM     [facility] [address] [place] [address] [phone] (office) [phone] (fax)

## 2015-07-15 LAB — CUP PACEART INCLINIC DEVICE CHECK: Date Time Interrogation Session: 20170531113942

## 2015-07-30 ENCOUNTER — Ambulatory Visit (INDEPENDENT_AMBULATORY_CARE_PROVIDER_SITE_OTHER): Payer: BLUE CROSS/BLUE SHIELD | Admitting: *Deleted

## 2015-07-30 DIAGNOSIS — I480 Paroxysmal atrial fibrillation: Secondary | ICD-10-CM

## 2015-07-30 NOTE — Progress Notes (Signed)
Carelink Summary Report / Loop Recorder 

## 2015-08-03 LAB — CUP PACEART REMOTE DEVICE CHECK: Date Time Interrogation Session: 20170517150917

## 2015-08-13 ENCOUNTER — Telehealth: Payer: Self-pay | Admitting: Cardiology

## 2015-08-13 NOTE — Telephone Encounter (Signed)
LMOVM requesting that pt send manual transmission b/c home monitor has not updated in at least 14 days.    

## 2015-08-27 LAB — CUP PACEART REMOTE DEVICE CHECK: Date Time Interrogation Session: 20170616153759

## 2015-08-30 ENCOUNTER — Ambulatory Visit (INDEPENDENT_AMBULATORY_CARE_PROVIDER_SITE_OTHER): Payer: BLUE CROSS/BLUE SHIELD | Admitting: *Deleted

## 2015-08-30 DIAGNOSIS — I480 Paroxysmal atrial fibrillation: Secondary | ICD-10-CM | POA: Diagnosis not present

## 2015-08-31 NOTE — Progress Notes (Signed)
Carelink Summary Report / Loop Recorder 

## 2015-09-22 ENCOUNTER — Encounter: Payer: Self-pay | Admitting: Internal Medicine

## 2015-09-28 ENCOUNTER — Ambulatory Visit (INDEPENDENT_AMBULATORY_CARE_PROVIDER_SITE_OTHER): Payer: BLUE CROSS/BLUE SHIELD | Admitting: *Deleted

## 2015-09-28 DIAGNOSIS — I480 Paroxysmal atrial fibrillation: Secondary | ICD-10-CM | POA: Diagnosis not present

## 2015-09-28 NOTE — Progress Notes (Signed)
Carelink Summary Report / Loop Recorder 

## 2015-09-30 LAB — CUP PACEART REMOTE DEVICE CHECK: Date Time Interrogation Session: 20170716163727

## 2015-10-07 ENCOUNTER — Encounter: Payer: Self-pay | Admitting: Internal Medicine

## 2015-10-07 ENCOUNTER — Ambulatory Visit (INDEPENDENT_AMBULATORY_CARE_PROVIDER_SITE_OTHER): Payer: BLUE CROSS/BLUE SHIELD | Admitting: Internal Medicine

## 2015-10-07 VITALS — BP 109/68 | HR 57 | Ht 67.0 in | Wt 175.6 lb

## 2015-10-07 DIAGNOSIS — I480 Paroxysmal atrial fibrillation: Secondary | ICD-10-CM

## 2015-10-07 DIAGNOSIS — I5022 Chronic systolic (congestive) heart failure: Secondary | ICD-10-CM | POA: Diagnosis not present

## 2015-10-07 NOTE — Progress Notes (Signed)
Electrophysiology Office Note   Date:  10/07/2015   ID:  Kiara [name], DOB 01/19/1961, MRN [id number]  PCP:  Londell MohPHARR,WALTER DAVIDSON, MD   Primary Electrophysiologist: Hillis RangeJames Brand Siever, MD    Chief Complaint  Patient presents with  . Atrial Fibrillation     History of Present Illness: Kiara [name] is a 55 y.o. female who presents today for electrophysiology follow-up.   She continues to have stress related to selling her house.  Today, she denies symptoms of palpitations, chest pain, shortness of breath, orthop  Her daughter has returned from [place] weeks in [place] and has returned to college.  Her AF seems well controlled.  She is making significant lifestyle change and has lost 20 lbs!  She denies CP, SOB, lower extremity edema, claudication, dizziness, presyncope, syncope, bleeding, or neurologic sequela. The patient is tolerating medications without difficulties and is otherwise without complaint today.    Past Medical History:  Diagnosis Date  . Asthma   . Atrial fibrillation (HCC)    a. admx with AFib with RVR and acute systolic CHF 07/2013; TEE with LAA clot and no DCCV done;    Marland Kitchen. Chronic systolic heart failure (HCC)   . Coronary artery disease    a.  LHC (07/17/13):  LM 20%, ostial CFX 20-30%  . Depression   . Hyperlipemia   . Hypothyroidism    past hx  . Nephrolithiasis 2013   "passed them"  . NICM (nonischemic cardiomyopathy) (HCC)    a. Echo (07/17/13):  EF 25-30%, diff HK, basal and mid anteroseptum/ basal inferoseptum and apical AK, trivial AI, mod MR, mod LAE, mild reduced RVSF, mild RAE  . Obesity   . Osteopenia    Past Surgical History:  Procedure Laterality Date  . ABDOMINAL HYSTERECTOMY  ~ 2001  . ABLATION  10/28/13   PVI by Dr Johney FrameAllred  . APPENDECTOMY  1978  . ATRIAL FIBRILLATION ABLATION N/A 10/28/2013   Procedure: ATRIAL FIBRILLATION ABLATION;  Surgeon: Gardiner RhymeJames D Saprina Chuong, MD;  [facility];  Service: Cardiovascular;  Laterality: N/A;  . CARDIAC  CATHETERIZATION  07/2013  . CARDIOVERSION N/A 08/18/2013   Procedure: CARDIOVERSION;  Surgeon: Donato SchultzMark Skains, MD;  [facility];  Service: Cardiovascular;  Laterality: N/A;  . CARDIOVERSION N/A 08/29/2013   Procedure: CARDIOVERSION;  Surgeon: Pricilla RifflePaula Ross V, MD;  [facility];  Service: Cardiovascular;  Laterality: N/A;  . ELECTROPHYSIOLOGIC STUDY N/A 11/05/2014   Procedure: Atrial Fibrillation Ablation;  Surgeon: Hillis RangeJames Adelai Achey, MD;  [facility];  Service: Cardiovascular;  Laterality: N/A;  . LEFT HEART CATHETERIZATION WITH CORONARY ANGIOGRAM N/A 07/17/2013   Procedure: LEFT HEART CATHETERIZATION WITH CORONARY ANGIOGRAM;  Surgeon: Micheline ChapmanMichael D Cooper, MD;  [facility];  Service: Cardiovascular;  Laterality: N/A;  . LOOP RECORDER IMPLANT N/A 06/05/2014   Procedure: LOOP RECORDER IMPLANT;  Surgeon: Hillis RangeJames Mumtaz Lovins, MD;  [facility];  Service: Cardiovascular;  Laterality: N/A;  . TEE WITHOUT CARDIOVERSION N/A 07/18/2013   Procedure: TRANSESOPHAGEAL ECHOCARDIOGRAM (TEE);  Surgeon: Lewayne BuntingBrian S Crenshaw, MD;  [facility];  Service: Cardiovascular;  Laterality: N/A;  . TEE WITHOUT CARDIOVERSION N/A 08/18/2013   Procedure: TRANSESOPHAGEAL ECHOCARDIOGRAM (TEE);  Surgeon: Donato SchultzMark Skains, MD;  [facility];  Service: Cardiovascular;  Laterality: N/A;  . TEE WITHOUT CARDIOVERSION N/A 10/27/2013   Procedure: TRANSESOPHAGEAL ECHOCARDIOGRAM (TEE);  Surgeon: Pricilla RifflePaula Ross V, MD;  [facility];  Service: Cardiovascular;  Laterality: N/A;  . TEE WITHOUT CARDIOVERSION N/A 11/04/2014   Procedure:  TRANSESOPHAGEAL ECHOCARDIOGRAM (TEE);  Surgeon: Chrystie Nose, MD;  Location: [facility] ENDOSCOPY;  Service: Cardiovascular;  Laterality: N/A;     Current Outpatient Prescriptions  Medication Sig Dispense Refill  . acetaminophen (TYLENOL) 325 MG tablet Take 650-975 mg by mouth every 6 (six) hours as needed for mild pain.     Marland Kitchen apixaban (ELIQUIS) 5 MG TABS tablet Take 1 tablet (5  mg total) by mouth 2 (two) times daily. 180 tablet 2  . atorvastatin (LIPITOR) 40 MG tablet TAKE 1 TABLET BY MOUTH EVERY DAY AT 6 PM 90 tablet 2  . buPROPion (WELLBUTRIN XL) 150 MG 24 hr tablet Take 150 mg by mouth daily.    . flecainide (TAMBOCOR) 50 MG tablet Take 1 tablet (50 mg total) by mouth 2 (two) times daily. 180 tablet 1  . furosemide (LASIX) 20 MG tablet Take 20 mg by mouth 5 days a week    . lisinopril (PRINIVIL,ZESTRIL) 5 MG tablet Take 1 tablet (5 mg total) by mouth daily. 90 tablet 2  . metoprolol tartrate (LOPRESSOR) 25 MG tablet Take 12.5 mg by mouth as directed.     . potassium chloride (K-DUR,KLOR-CON) 10 MEQ tablet Take 1 tablet (10 mEq total) by mouth every other day. When taking Lasix 45 tablet 2  . sertraline (ZOLOFT) 100 MG tablet Take 200 mg by mouth every morning.      No current facility-administered medications for this visit.     Allergies:   Azithromycin   Social History:  The patient  reports that she has never smoked. She has never used smokeless tobacco. She reports that she does not drink alcohol or use drugs.   Family History:  The patient's  family history includes Hypertension in her father and mother; Lymphoma in her mother.    ROS:  Please see the history of present illness.   All other systems are reviewed and negative.    PHYSICAL EXAM: VS:  BP 109/68 (BP Location: Left Arm, Patient Position: Sitting, Cuff Size: Normal)   Pulse (!) 57   Ht 5\' 7"  (1.702 m)   Wt 175 lb 9.6 oz (79.7 kg)   SpO2 98%   BMI 27.50 kg/m  , BMI Body mass index is 27.5 kg/m. GEN: Well nourished, well developed, in no acute distress  HEENT: normal  Neck: no JVD, carotid bruits, or masses Cardiac: RRR; no murmurs, rubs, or gallops,no edema  Respiratory:  clear to auscultation bilaterally, normal work of breathing GI: soft, nontender, nondistended, + BS MS: no deformity or atrophy  Skin: warm and dry  Neuro:  Strength and sensation are intact Psych: euthymic mood,  full affect  ILR interrogation today is reviewed- AF burden 2.8% (previously 6%), no other arrhythmias   Recent Labs: 11/06/2014: BUN 8; Creatinine, Ser 0.52; Hemoglobin 12.2; Platelets 131; Potassium 3.4; Sodium 140    Lipid Panel     Component Value Date/Time   CHOL 198 07/17/2013 0720   TRIG 69 07/17/2013 0720   HDL 59 07/17/2013 0720   CHOLHDL 3.4 07/17/2013 0720   VLDL 14 07/17/2013 0720   LDLCALC 125 (H) 07/17/2013 0720     Wt Readings from Last 3 Encounters:  10/07/15 175 lb 9.6 oz (79.7 kg)  07/14/15 186 lb (84.4 kg)  12/03/14 192 lb 12.8 oz (87.5 kg)      ASSESSMENT AND PLAN:  1.  Persistent afib Appears to be improved post ablation though not resolved Will continue eliquis Continue flecainide.  Will obtain ETT to evaluate for  exercise induced arrhythmias. I am encouraged with her lifestyle change.  She has lost 20 lbs!  2. Nonischemic CM EF has previously normalized We should try to keep in sinus long term.  Return to see me in 4 months  Current medicines are reviewed at length with the patient today.   The patient does not have concerns regarding her medicines.  The following changes were made today:  none    Signed, Hillis Range, MD  10/07/2015 3:46 PM     [facility] [address] [place] [address] [phone] (office) [phone] (fax)

## 2015-10-07 NOTE — Patient Instructions (Signed)
Medication Instructions:  Your physician recommends that you continue on your current medications as directed. Please refer to the Current Medication list given to you today.   Labwork: None ordered   Testing/Procedures:  Your physician has requested that you have an exercise tolerance test. For further information please visit [email or web address] Please also follow instruction sheet, as given.    Follow-Up:  Your physician recommends that you schedule a follow-up appointment in: 4 months with Dr Johney Frame    Any Other Special Instructions Will Be Listed Below (If Applicable).     If you need a refill on your cardiac medications before your next appointment, please call your pharmacy.

## 2015-10-11 LAB — CUP PACEART INCLINIC DEVICE CHECK: Date Time Interrogation Session: 20170828080712

## 2015-10-20 ENCOUNTER — Ambulatory Visit (INDEPENDENT_AMBULATORY_CARE_PROVIDER_SITE_OTHER): Payer: BLUE CROSS/BLUE SHIELD

## 2015-10-20 DIAGNOSIS — I480 Paroxysmal atrial fibrillation: Secondary | ICD-10-CM

## 2015-10-20 LAB — EXERCISE TOLERANCE TEST
Estimated workload: 10.1 METS
Exercise duration (min): 9 min
Exercise duration (sec): 0 s
MPHR: 163 {beats}/min
Peak HR: 122 {beats}/min
Percent HR: 74 %
RPE: 15
Rest HR: 57 {beats}/min

## 2015-10-26 LAB — CUP PACEART REMOTE DEVICE CHECK: Date Time Interrogation Session: 20170815163821

## 2015-10-28 ENCOUNTER — Ambulatory Visit (INDEPENDENT_AMBULATORY_CARE_PROVIDER_SITE_OTHER): Payer: BLUE CROSS/BLUE SHIELD | Admitting: *Deleted

## 2015-10-28 DIAGNOSIS — I480 Paroxysmal atrial fibrillation: Secondary | ICD-10-CM | POA: Diagnosis not present

## 2015-10-29 NOTE — Progress Notes (Signed)
Carelink Summary Report / Loop Recorder 

## 2015-11-19 ENCOUNTER — Encounter: Payer: Self-pay | Admitting: Internal Medicine

## 2015-11-20 LAB — CUP PACEART REMOTE DEVICE CHECK: Date Time Interrogation Session: 20170914170736

## 2015-11-20 NOTE — Progress Notes (Signed)
Carelink summary report received. Battery status OK. Normal device function. No new symptom episodes, brady, or pause episodes. 3.1% AF, +Eliquis. At next office visit, change AF collection to longest only and extend tachy detection.

## 2015-11-29 ENCOUNTER — Ambulatory Visit (INDEPENDENT_AMBULATORY_CARE_PROVIDER_SITE_OTHER): Payer: BLUE CROSS/BLUE SHIELD | Admitting: *Deleted

## 2015-11-29 DIAGNOSIS — I480 Paroxysmal atrial fibrillation: Secondary | ICD-10-CM

## 2015-11-29 NOTE — Progress Notes (Signed)
Carelink Summary Report / Loop Recorder 

## 2015-11-30 IMAGING — CR DG CHEST 1V PORT
1 series · 1 of 1 positions shown · non-contrast
Comparison: None.

CLINICAL DATA: AFib with RVR

EXAM:
PORTABLE CHEST - 1 VIEW

[AP]
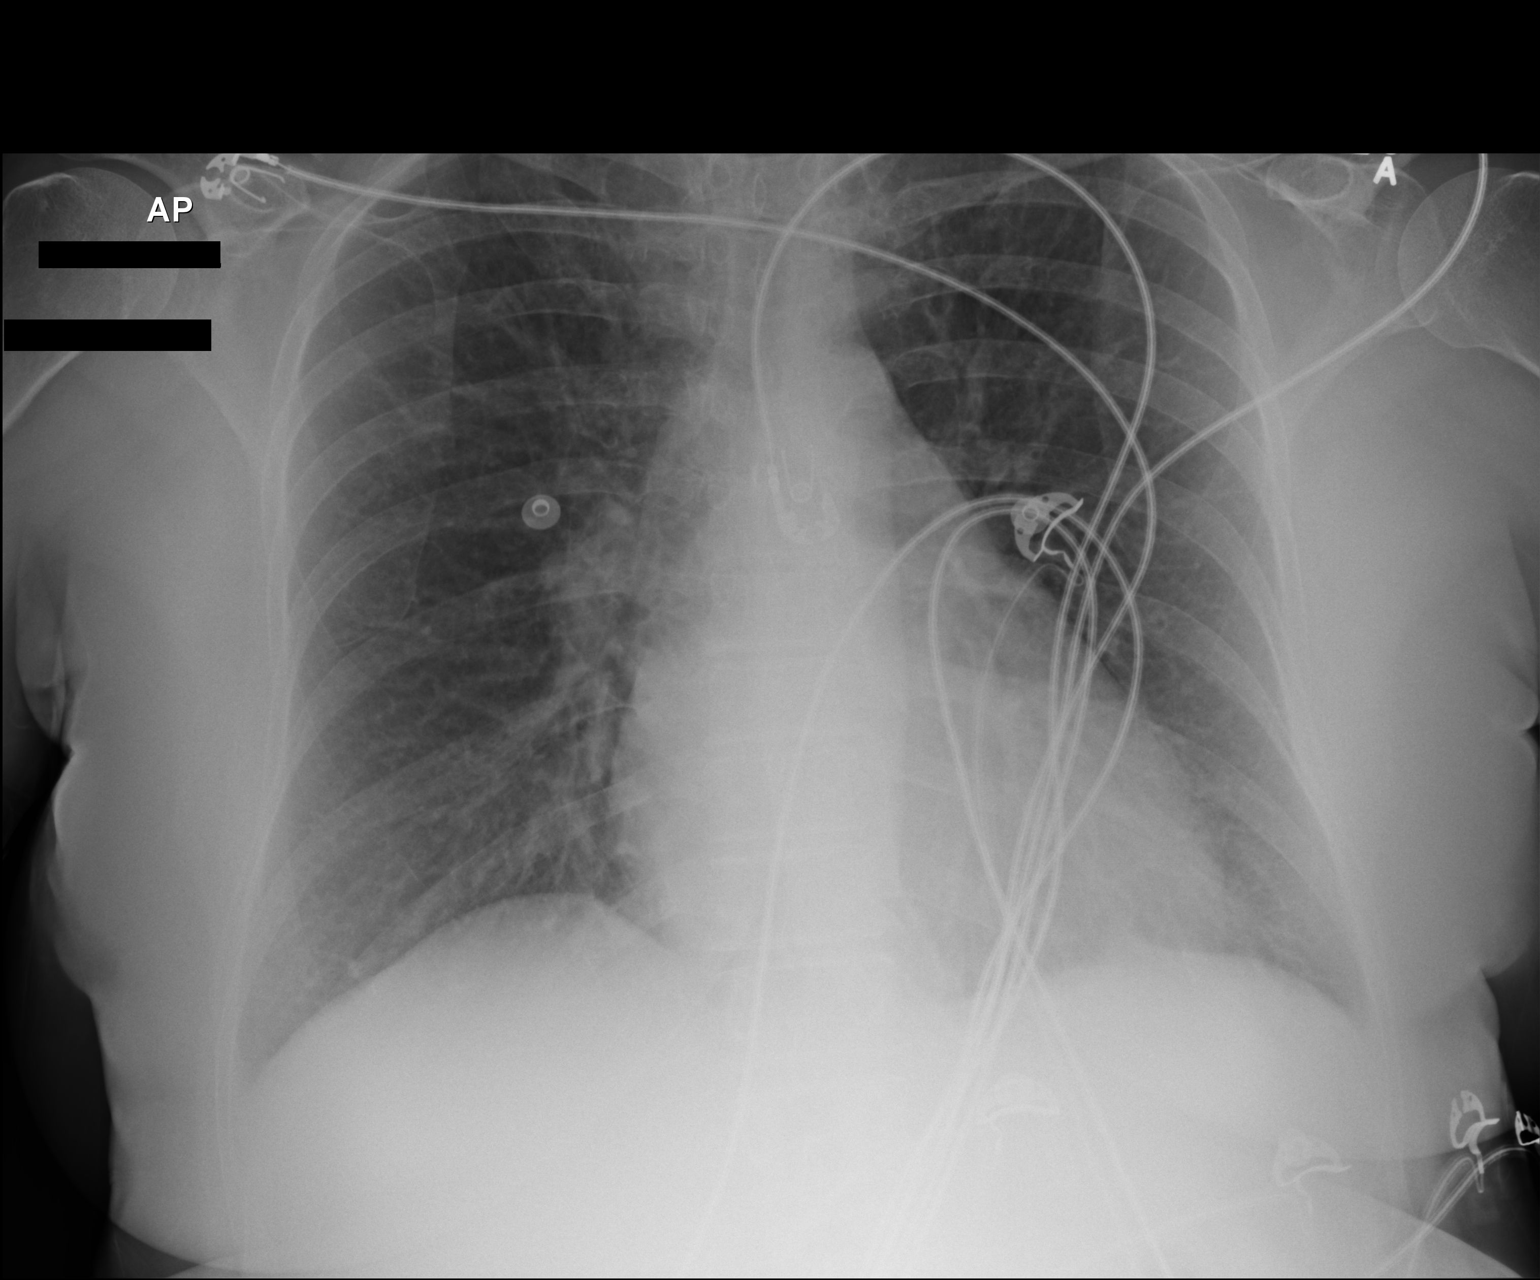

[1 of 1 positions shown; findings below may reference images not displayed]

FINDINGS: Chronic interstitial markings. Mild left basilar opacity, likely
atelectasis. No pleural effusion or pneumothorax.

The heart is normal in size.
IMPRESSION: No evidence of acute cardiopulmonary disease.

## 2015-12-27 ENCOUNTER — Ambulatory Visit (INDEPENDENT_AMBULATORY_CARE_PROVIDER_SITE_OTHER): Payer: BLUE CROSS/BLUE SHIELD | Admitting: *Deleted

## 2015-12-27 DIAGNOSIS — I480 Paroxysmal atrial fibrillation: Secondary | ICD-10-CM

## 2015-12-28 NOTE — Progress Notes (Signed)
Carelink Summary Report / Loop Recorder 

## 2016-01-01 LAB — CUP PACEART REMOTE DEVICE CHECK
Date Time Interrogation Session: 20171014174041
Implantable Pulse Generator Implant Date: 20160422

## 2016-01-01 NOTE — Progress Notes (Signed)
Carelink summary report received. Battery status OK. Normal device function. No new symptom episodes, brady, or pause episodes. 2.8% AF, +Eliquis, V rates elevated. 1 tachy episode, AF with RVR. Monthly summary reports and ROV/PRN

## 2016-01-08 ENCOUNTER — Other Ambulatory Visit: Payer: Self-pay | Admitting: Internal Medicine

## 2016-01-26 ENCOUNTER — Ambulatory Visit (INDEPENDENT_AMBULATORY_CARE_PROVIDER_SITE_OTHER): Payer: BLUE CROSS/BLUE SHIELD | Admitting: *Deleted

## 2016-01-26 DIAGNOSIS — I480 Paroxysmal atrial fibrillation: Secondary | ICD-10-CM

## 2016-01-27 NOTE — Progress Notes (Signed)
Carelink Summary Report / Loop Recorder 

## 2016-02-09 ENCOUNTER — Encounter: Payer: BLUE CROSS/BLUE SHIELD | Admitting: Internal Medicine

## 2016-02-09 LAB — CUP PACEART REMOTE DEVICE CHECK
Date Time Interrogation Session: 20171113184450
Implantable Pulse Generator Implant Date: 20160422

## 2016-02-25 ENCOUNTER — Ambulatory Visit (INDEPENDENT_AMBULATORY_CARE_PROVIDER_SITE_OTHER): Payer: BLUE CROSS/BLUE SHIELD | Admitting: *Deleted

## 2016-02-25 DIAGNOSIS — I480 Paroxysmal atrial fibrillation: Secondary | ICD-10-CM

## 2016-02-28 NOTE — Progress Notes (Signed)
Carelink Summary Report / Loop Recorder 

## 2016-03-13 ENCOUNTER — Ambulatory Visit (INDEPENDENT_AMBULATORY_CARE_PROVIDER_SITE_OTHER): Payer: BLUE CROSS/BLUE SHIELD | Admitting: Internal Medicine

## 2016-03-13 ENCOUNTER — Encounter: Payer: Self-pay | Admitting: Internal Medicine

## 2016-03-13 VITALS — BP 117/72 | HR 56 | Ht 67.0 in | Wt 172.4 lb

## 2016-03-13 DIAGNOSIS — I480 Paroxysmal atrial fibrillation: Secondary | ICD-10-CM

## 2016-03-13 LAB — CUP PACEART INCLINIC DEVICE CHECK
Date Time Interrogation Session: 20180129160552
Implantable Pulse Generator Implant Date: 20160422

## 2016-03-13 NOTE — Progress Notes (Signed)
Electrophysiology Office Note   Date:  03/13/2016   ID:  Kiara, [name] 09-Toby-1962, MRN [id number]  PCP:  Londell Moh, MD   Primary Electrophysiologist: Hillis Range, MD    Chief Complaint  Patient presents with  . Pacemaker Check    PAF/Loop recorder     History of Present Illness: Kiara [name] is a 56 y.o. female who presents today for electrophysiology follow-up.  She is pleased to have sold her house!  Af is much better.  Her daughter graduates from college in May and will then move to [place].  She is making significant lifestyle change and has lost 27 lbs!  She denies CP, SOB, lower extremity edema, claudication, dizziness, presyncope, syncope, bleeding, or neurologic sequela. The patient is tolerating medications without difficulties and is otherwise without complaint today.    Past Medical History:  Diagnosis Date  . Asthma   . Atrial fibrillation (HCC)    a. admx with AFib with RVR and acute systolic CHF 07/2013; TEE with LAA clot and no DCCV done;    Marland Kitchen Chronic systolic heart failure (HCC)   . Coronary artery disease    a.  LHC (07/17/13):  LM 20%, ostial CFX 20-30%  . Depression   . Hyperlipemia   . Hypothyroidism    past hx  . Nephrolithiasis 2013   "passed them"  . NICM (nonischemic cardiomyopathy) (HCC)    a. Echo (07/17/13):  EF 25-30%, diff HK, basal and mid anteroseptum/ basal inferoseptum and apical AK, trivial AI, mod MR, mod LAE, mild reduced RVSF, mild RAE  . Obesity   . Osteopenia    Past Surgical History:  Procedure Laterality Date  . ABDOMINAL HYSTERECTOMY  ~ 2001  . ABLATION  10/28/13   PVI by Dr Johney Frame  . APPENDECTOMY  1978  . ATRIAL FIBRILLATION ABLATION N/A 10/28/2013   Procedure: ATRIAL FIBRILLATION ABLATION;  Surgeon: Gardiner Rhyme, MD;  [facility]: [facility] C[facility];  Service: Cardiovascular;  Laterality: N/A;  . CARDIAC CATHETERIZATION  07/2013  . CARDIOVERSION N/A 08/18/2013   Procedure: CARDIOVERSION;  Surgeon: Donato Schultz, MD;   Location[facility] ENDOSCOPY;  Service: Cardiovascular;  Laterality: N/A;  . CARDIOVERSION N/A 08/29/2013   Procedure: CARDIOVERSION;  Surgeon: Pricilla Riffle, MD;  Location: [facility] OR;  Service: Cardiovascular;  Laterality: N/A;  . ELECTROPHYSIOLOGIC STUDY N/A 11/05/2014   Procedure: Atrial Fibrillation Ablation;  Surgeon: Hillis Range, MD;  Location: [facility] INVASIVE CV LAB;  Service: Cardiovascular;  Laterality: N/A;  . LEFT HEART CATHETERIZATION WITH CORONARY ANGIOGRAM N/A 07/17/2013   Procedure: LEFT HEART CATHETERIZATION WITH CORONARY ANGIOGRAM;  Surgeon: Micheline Chapman, MD;  Location: [facility] CATH LAB;  Service: Cardiovascular;  Laterality: N/A;  . LOOP RECORDER IMPLANT N/A 06/05/2014   Procedure: LOOP RECORDER IMPLANT;  Surgeon: Hillis Range, MD;  Location: [facility] CATH LAB;  Service: Cardiovascular;  Laterality: N/A;  . TEE WITHOUT CARDIOVERSION N/A 07/18/2013   Procedure: TRANSESOPHAGEAL ECHOCARDIOGRAM (TEE);  Surgeon: Lewayne Bunting, MD;  Location: [facility] ENDOSCOPY;  Service: Cardiovascular;  Laterality: N/A;  . TEE WITHOUT CARDIOVERSION N/A 08/18/2013   Procedure: TRANSESOPHAGEAL ECHOCARDIOGRAM (TEE);  Surgeon: Donato Schultz, MD;  Location: [facility] ENDOSCOPY;  Service: Cardiovascular;  Laterality: N/A;  . TEE WITHOUT CARDIOVERSION N/A 10/27/2013   Procedure: TRANSESOPHAGEAL ECHOCARDIOGRAM (TEE);  Surgeon: Pricilla Riffle, MD;  Location: [facility] ENDOSCOPY;  Service: Cardiovascular;  Laterality: N/A;  . TEE WITHOUT CARDIOVERSION N/A 11/04/2014   Procedure: TRANSESOPHAGEAL ECHOCARDIOGRAM (TEE);  Surgeon: Chrystie Nose, MD;  Location: [facility] ENDOSCOPY;  Service: Cardiovascular;  Laterality: N/A;     Current Outpatient Prescriptions  Medication Sig Dispense Refill  . acetaminophen (TYLENOL) 325 MG tablet Take 650-975 mg by mouth every 6 (six) hours as needed for mild pain.     Marland Kitchen atorvastatin (LIPITOR) 40 MG tablet TAKE 1 TABLET OP EVERY DAY AT 6PM 90 tablet 2  . buPROPion (WELLBUTRIN XL) 150 MG 24 hr tablet Take 150 mg by mouth daily.      Marland Kitchen ELIQUIS 5 MG TABS tablet TAKE 1 TABLET BY MOUTH TWICE A DAY 180 tablet 2  . flecainide (TAMBOCOR) 50 MG tablet TAKE 1 TABLET (50 MG TOTAL) BY MOUTH 2 (TWO) TIMES DAILY. 180 tablet 1  . furosemide (LASIX) 20 MG tablet TAKE 1 TABLET BY MOUTH EVERY OTHER DAY 45 tablet 2  . lisinopril (PRINIVIL,ZESTRIL) 5 MG tablet TAKE 1 TABLET BY MOUTH EVERY DAY 90 tablet 2  . metoprolol tartrate (LOPRESSOR) 25 MG tablet Take 12.5 mg by mouth as directed.     . potassium chloride (K-DUR,KLOR-CON) 10 MEQ tablet Take 1 tablet (10 mEq total) by mouth every other day. When taking Lasix 45 tablet 2  . sertraline (ZOLOFT) 100 MG tablet Take 200 mg by mouth every morning.      No current facility-administered medications for this visit.     Allergies:   Azithromycin   Social History:  The patient  reports that she has never smoked. She has never used smokeless tobacco. She reports that she does not drink alcohol or use drugs.   Family History:  The patient's  family history includes Hypertension in her father and mother; Lymphoma in her mother.    ROS:  Please see the history of present illness.   All other systems are reviewed and negative.    PHYSICAL EXAM: VS:  BP 117/72   Pulse (!) 56   Ht 5\' 7"  (1.702 m)   Wt 172 lb 6.4 oz (78.2 kg)   BMI 27.00 kg/m  , BMI Body mass index is 27 kg/m. GEN: Well nourished, well developed, in no acute distress  HEENT: normal  Neck: no JVD, carotid bruits, or masses Cardiac: RRR; no murmurs, rubs, or gallops,no edema  Respiratory:  clear to auscultation bilaterally, normal work of breathing GI: soft, nontender, nondistended, + BS MS: no deformity or atrophy  Skin: warm and dry  Neuro:  Strength and sensation are intact Psych: euthymic mood, full affect  ILR interrogation today is reviewed- AF burden 1.7%, last visit 2.8% (previously 6%), no other arrhythmias  ETT reviewed  Lipid Panel     Component Value Date/Time   CHOL 198 07/17/2013 0720   TRIG 69  07/17/2013 0720   HDL 59 07/17/2013 0720   CHOLHDL 3.4 07/17/2013 0720   VLDL 14 07/17/2013 0720   LDLCALC 125 (H) 07/17/2013 0720     Wt Readings from Last 3 Encounters:  03/13/16 172 lb 6.4 oz (78.2 kg)  10/07/15 175 lb 9.6 oz (79.7 kg)  07/14/15 186 lb (84.4 kg)      ASSESSMENT AND PLAN:  1.  Persistent afib Appears to be improved  Will continue eliquis Continue flecainide.  We discussed increasing the dose but she wishes to continue her current dosing for now.  She has prn metoprolol for RVR. I am encouraged with her lifestyle change.  She has lost 27 lbs! (7 since last visit)  2. Nonischemic CM EF has previously normalized We should try to keep in sinus long term.  Return to see  me in 6 months Repeat echo on return    Signed, Hillis Range, MD  03/13/2016 2:27 PM     [facility] [address] [place] [address] [phone] (office) [phone] (fax)

## 2016-03-13 NOTE — Patient Instructions (Addendum)
Medication Instructions:  Your physician recommends that you continue on your current medications as directed. Please refer to the Current Medication list given to you today.   Labwork: None ordered   Testing/Procedures: None ordered   Follow-Up: Your physician wants you to follow-up in: 6 months with Hillis Range You will receive a reminder letter in the mail two months in advance. If you don't receive a letter, please call our office to schedule the follow-up appointment.  Remote monitoring is used to monitor your Pacemaker from home. This monitoring reduces the number of office visits required to check your device to one time per year. It allows [place] to keep an eye on the functioning of your device to ensure it is working properly. You are scheduled for a device check from home on 06/12/16. You may send your transmission at any time that day. If you have a wireless device, the transmission will be sent automatically. After your physician reviews your transmission, you will receive a postcard with your next transmission date.     Any Other Special Instructions Will Be Listed Below (If Applicable).     If you need a refill on your cardiac medications before your next appointment, please call your pharmacy.

## 2016-03-16 ENCOUNTER — Other Ambulatory Visit: Payer: Self-pay | Admitting: Obstetrics and Gynecology

## 2016-03-16 DIAGNOSIS — E041 Nontoxic single thyroid nodule: Secondary | ICD-10-CM

## 2016-03-18 LAB — CUP PACEART REMOTE DEVICE CHECK
Date Time Interrogation Session: 20171213190757
Implantable Pulse Generator Implant Date: 20160422

## 2016-03-18 NOTE — Progress Notes (Signed)
Carelink summary report received. Battery status OK. Normal device function. No new symptom episodes, tachy episodes, brady, or pause episodes. 5 AF episodes 0.8% +Eliquis. Monthly summary reports and ROV/PRN

## 2016-03-20 ENCOUNTER — Ambulatory Visit
Admission: RE | Admit: 2016-03-20 | Discharge: 2016-03-20 | Disposition: A | Discharge status: Home / Self Care | Payer: BLUE CROSS/BLUE SHIELD | Source: Ambulatory Visit | Attending: Obstetrics and Gynecology | Admitting: Obstetrics and Gynecology

## 2016-03-20 DIAGNOSIS — E041 Nontoxic single thyroid nodule: Secondary | ICD-10-CM

## 2016-03-27 ENCOUNTER — Ambulatory Visit (INDEPENDENT_AMBULATORY_CARE_PROVIDER_SITE_OTHER): Payer: BLUE CROSS/BLUE SHIELD | Admitting: *Deleted

## 2016-03-27 DIAGNOSIS — I480 Paroxysmal atrial fibrillation: Secondary | ICD-10-CM | POA: Diagnosis not present

## 2016-03-28 NOTE — Progress Notes (Signed)
Carelink Summary Report / Loop Recorder 

## 2016-04-06 LAB — CUP PACEART REMOTE DEVICE CHECK
Date Time Interrogation Session: 20180112194114
Implantable Pulse Generator Implant Date: 20160422

## 2016-04-10 IMAGING — US US SOFT TISSUE HEAD/NECK
1 series · 13 of 25 positions shown · non-contrast
Comparison: Most recent prior thyroid ultrasound 08/27/2012

CLINICAL DATA: 53-year-old female with thyroid nodules. Annual
followup evaluation.

EXAM:
THYROID ULTRASOUND
TECHNIQUE: Ultrasound examination of the thyroid gland and adjacent soft
tissues was performed.

[Series 1: us soft tissue head/neck · 0.07mm/px · 13 of 52 slices shown]
[im 1/52]
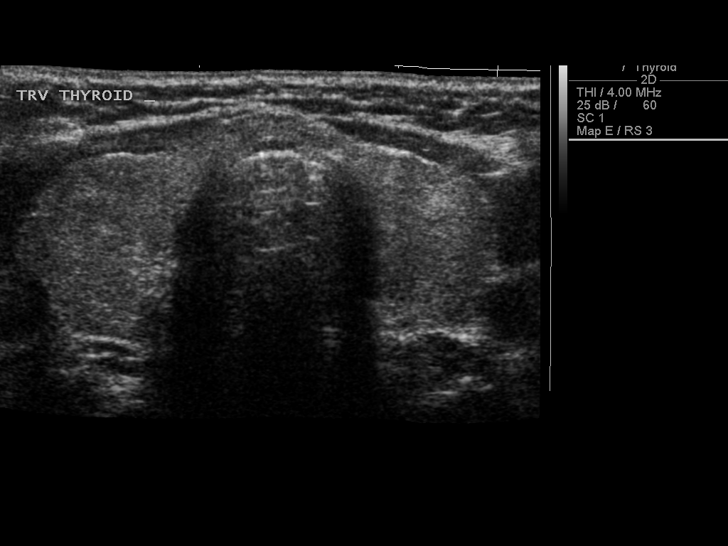
[im 5/52]
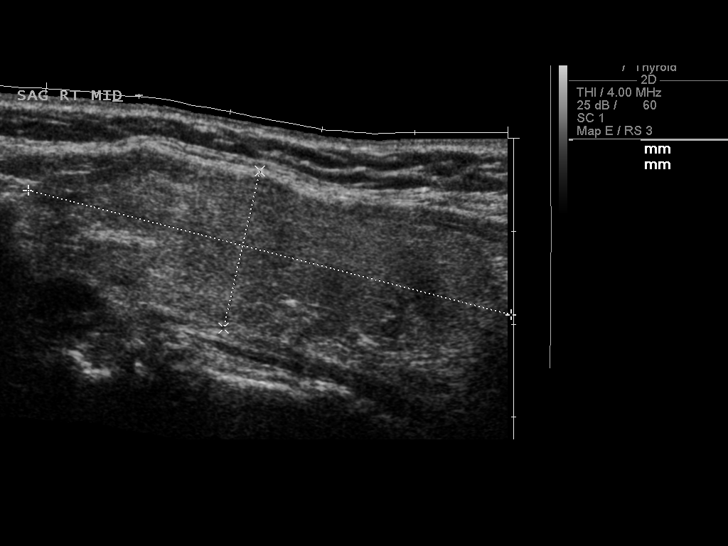
[im 9/52]
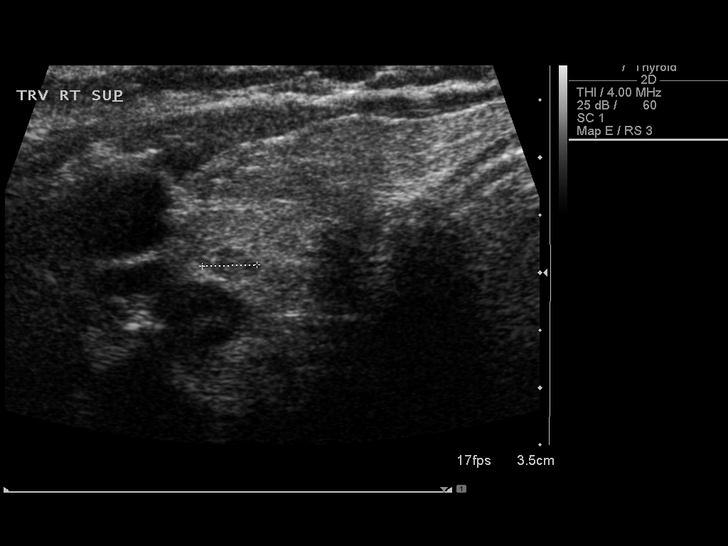
[im 13/52]
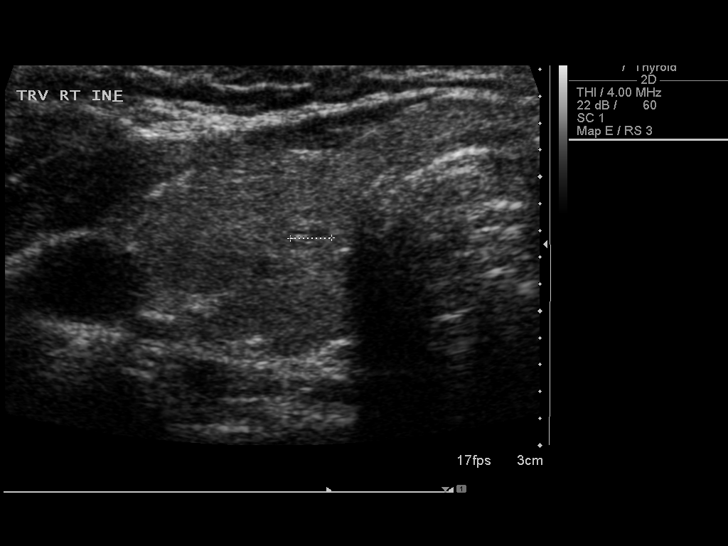
[im 18/52]
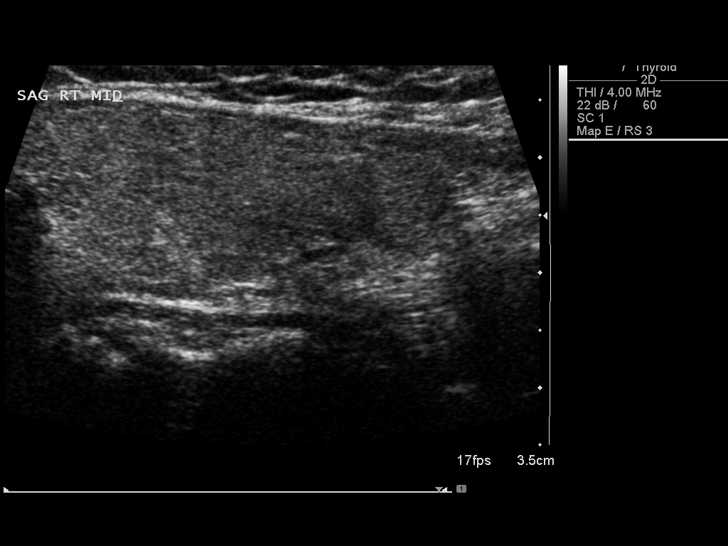
[im 22/52]
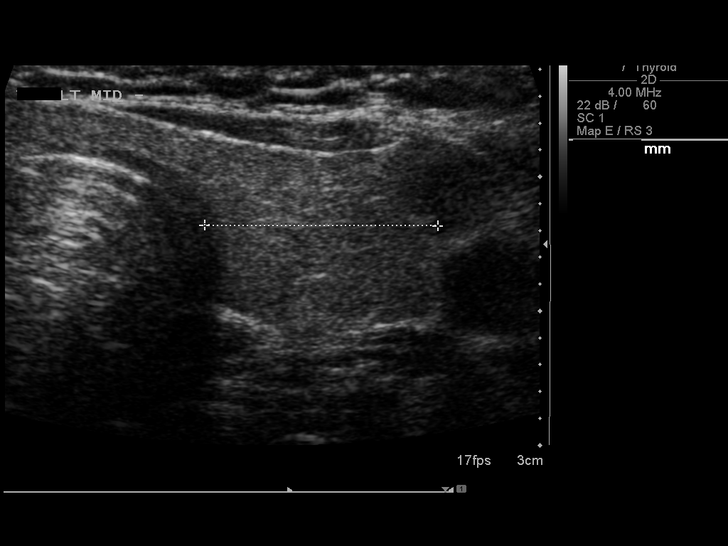
[im 26/52]
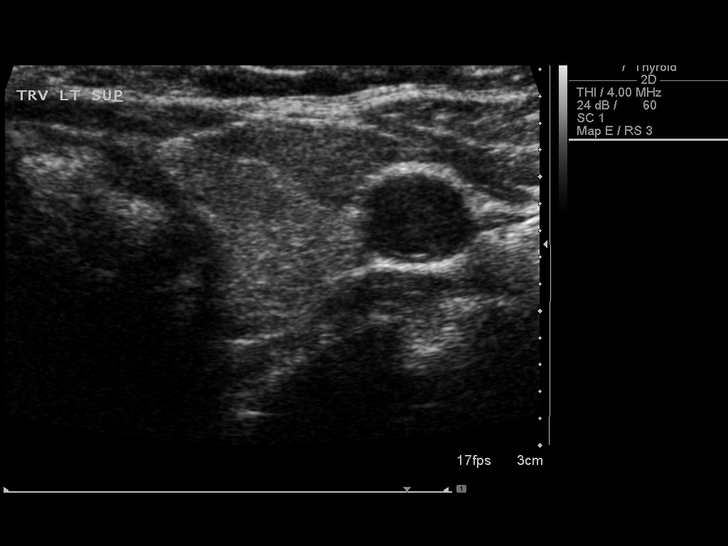
[im 30/52]
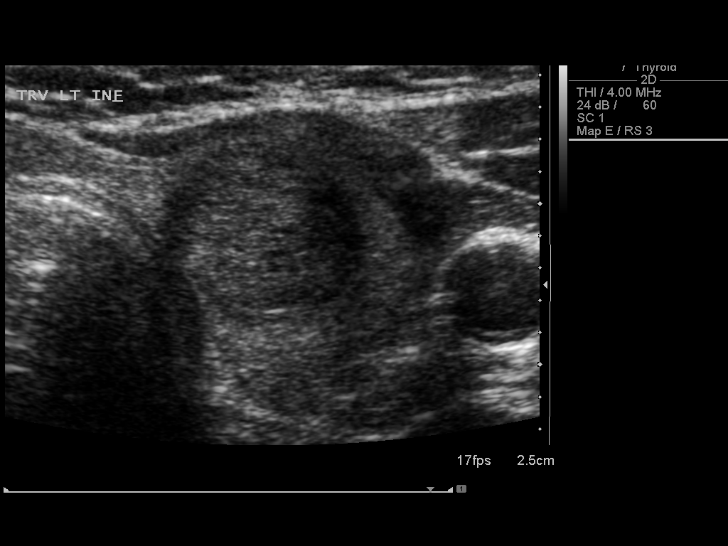
[im 35/52]
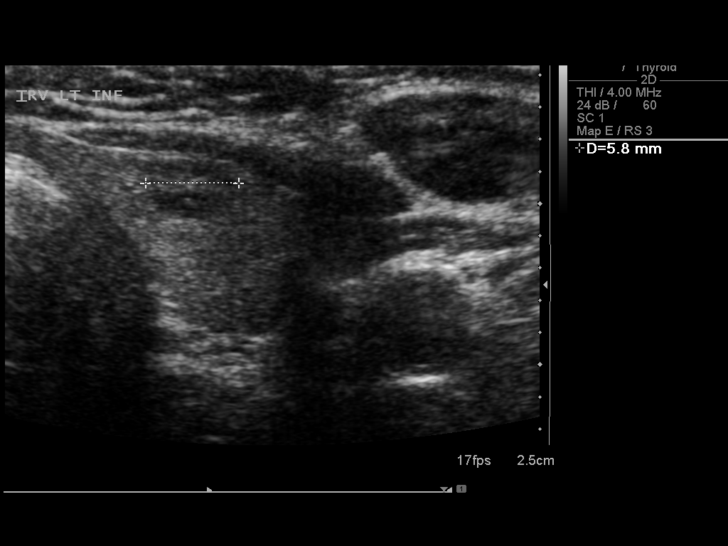
[im 39/52]
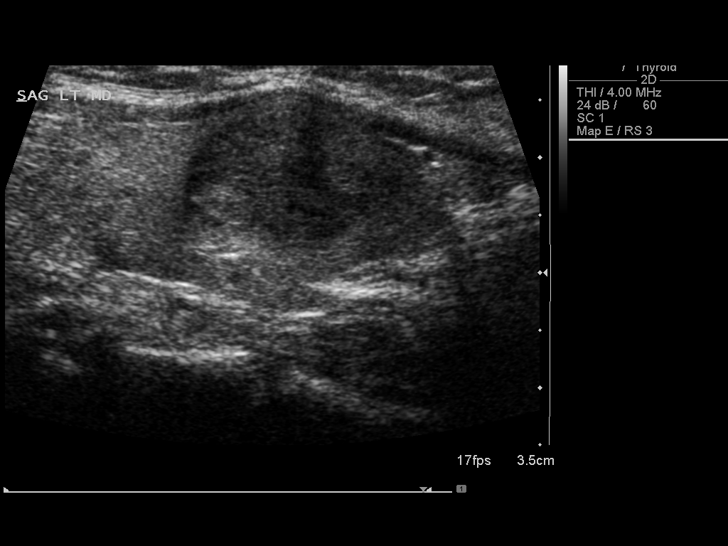
[im 43/52]
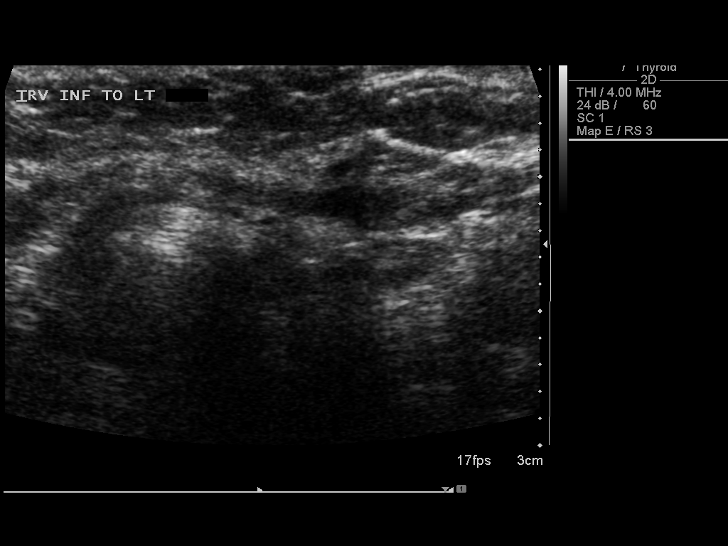
[im 47/52]
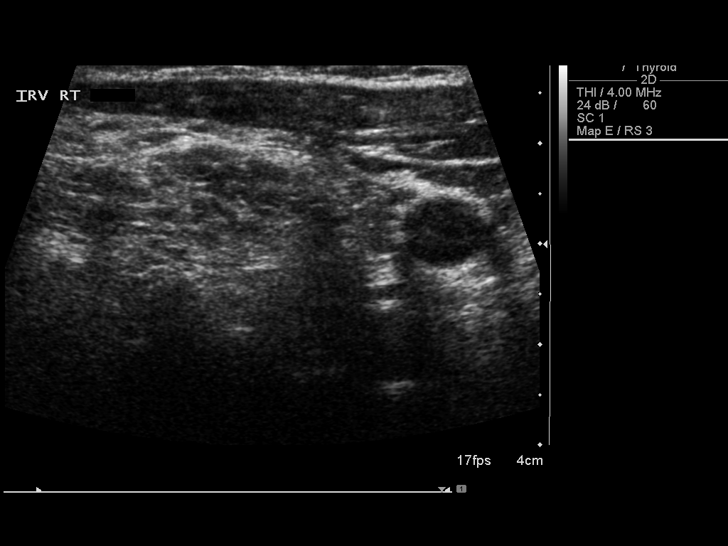
[im 52/52]
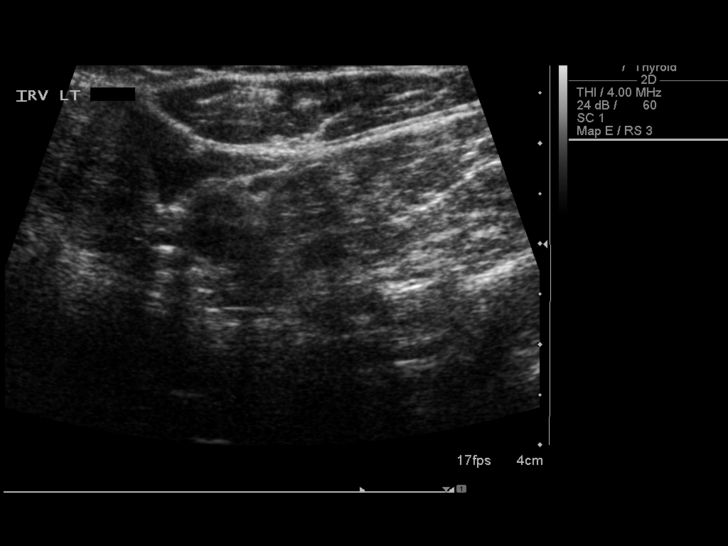

[13 of 25 positions shown; findings below may reference images not displayed]

FINDINGS: Right thyroid lobe

Measurements: 5.4 x 1.7 x 1.9 cm. Diffusely heterogeneous thyroid
gland. Small focal hypoechoic solid nodules in the upper and lower
aspect of the gland measure between 3 and 5 mm.

Left thyroid lobe

Measurements: 5.1 x 1.8 x 1.7 cm. The dominant solid slightly
hypoechoic nodule in the mid to lower aspect of the left gland
measures 1.2 x 1.2 x 1.8 cm compared to 1.4 x 1.3 x 1.8 cm. There is
no significant interval change. There is an adjacent 6 mm hypoechoic
solid nodule more inferiorly within the left gland which was not
discretely measured on the prior study.

Isthmus

Thickness: 0.4 cm.  No nodules visualized.

Lymphadenopathy

None visualized.
IMPRESSION: 1. No significant interval change in the size or appearance of the
1.8 cm solid nodule in the mid to lower aspect of the left thyroid
gland compared to 08/27/2012.
2. Small (3-6 mm) solid nodules identified bilaterally which were
not discretely measured on the prior study. Findings do not meet
current SRU consensus criteria for biopsy. Follow-up by clinical
exam is recommended. If patient has known risk factors for thyroid
carcinoma, consider follow-up ultrasound in 12 months. If patient is
clinically hyperthyroid, consider nuclear medicine thyroid uptake
and scan.

Reference: Management of Thyroid Nodules Detected at US: Society of
Radiologists in Ultrasound Consensus Conference Statement. Radiology

## 2016-04-18 LAB — CUP PACEART REMOTE DEVICE CHECK
Date Time Interrogation Session: 20180211203552
Implantable Pulse Generator Implant Date: 20160422

## 2016-04-18 NOTE — Progress Notes (Signed)
Carelink summary report received. Battery status OK. Normal device function. No new symptom episodes, brady, or pause episodes. 6 tachy- ECGs appear AF w/ RVR. 3 AF 0.8% +Eliquis +metoprolol +flecainide ECGs appear AF w/ RVR. Histograms ok overall Monthly summary reports and ROV/PRN

## 2016-04-25 ENCOUNTER — Ambulatory Visit (INDEPENDENT_AMBULATORY_CARE_PROVIDER_SITE_OTHER): Payer: BLUE CROSS/BLUE SHIELD | Admitting: *Deleted

## 2016-04-25 DIAGNOSIS — I480 Paroxysmal atrial fibrillation: Secondary | ICD-10-CM

## 2016-04-26 NOTE — Progress Notes (Signed)
Carelink Summary Report / Loop Recorder 

## 2016-04-30 LAB — CUP PACEART REMOTE DEVICE CHECK
Date Time Interrogation Session: 20180313203614
Implantable Pulse Generator Implant Date: 20160422

## 2016-04-30 NOTE — Progress Notes (Signed)
Carelink summary report received. Battery status OK. Normal device function. No new symptom episodes, tachy episodes, brady, or pause episodes.9 AF 1.8% +Eliquis. Monthly summary reports and ROV/PRN

## 2016-05-01 ENCOUNTER — Telehealth: Payer: Self-pay | Admitting: Cardiology

## 2016-05-01 NOTE — Telephone Encounter (Signed)
Spoke with patient and her husband regarding their billing concerns.  Billing issue from 03/13/16 OV is in the process of being resolved between [facility] and [facility].  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  Patient has not met her deductible this calendar year per her husband.  Advised I will forward to Dr. Rayann Heman for additional recommendations regarding ILR monitoring.

## 2016-05-01 NOTE — Telephone Encounter (Signed)
I think the best option may be for her to [facility] her transmitter and only follow ILR when we see her in the office.  We can follow her AF in the office and not remotely.  If she prefers to continue remote monitoring then she will likely have ongoing copays.

## 2016-05-01 NOTE — Telephone Encounter (Signed)
Pt called and stated that since October she has been receiving a bill for $195. Before October she was paying $86.00. She wants to discuss having loop recorder removed. She stated that she has had the loop recorder for almost 3 years and she feels better. She wants to discuss d/c of remote monitoring or having loop recorder removed.

## 2016-05-02 NOTE — Telephone Encounter (Signed)
LMOVM for pt to return call 

## 2016-05-03 NOTE — Telephone Encounter (Signed)
2nd attempt  LMOVM for pt to return call.  

## 2016-05-05 NOTE — Telephone Encounter (Signed)
Spoke w/ pt and informed pt of MD recommendations. Pt stated that her and her husband has been discussing the home monitor issue and may decide to not unplug it. Pt is aware that if this is her decision to call the office and make [place] aware.

## 2016-05-12 ENCOUNTER — Other Ambulatory Visit: Payer: Self-pay | Admitting: Internal Medicine

## 2016-05-12 MED ORDER — FLECAINIDE ACETATE 50 MG PO TABS
50.0000 mg | ORAL_TABLET | Freq: Two times a day (BID) | ORAL | 2 refills | Status: DC
Start: 2016-05-12 — End: 2016-10-09

## 2016-05-18 ENCOUNTER — Telehealth: Payer: Self-pay | Admitting: Cardiology

## 2016-05-18 NOTE — Telephone Encounter (Signed)
LMOVM requesting that pt send manual transmission b/c home monitor has not updated in at least 14 days.    

## 2016-05-25 ENCOUNTER — Ambulatory Visit: Payer: BLUE CROSS/BLUE SHIELD | Admitting: *Deleted

## 2016-05-25 ENCOUNTER — Encounter: Payer: Self-pay | Admitting: Cardiology

## 2016-05-26 NOTE — Progress Notes (Signed)
Carelink Summary Report / Loop Recorder 

## 2016-06-08 ENCOUNTER — Encounter: Payer: Self-pay | Admitting: Cardiology

## 2016-06-26 ENCOUNTER — Encounter: Payer: BLUE CROSS/BLUE SHIELD | Admitting: *Deleted

## 2016-07-12 ENCOUNTER — Other Ambulatory Visit: Payer: Self-pay | Admitting: Internal Medicine

## 2016-07-12 NOTE — Telephone Encounter (Signed)
Medication Detail    Disp Refills Start End   flecainide (TAMBOCOR) 50 MG tablet 180 tablet 2 05/12/2016    Sig - Route: Take 1 tablet (50 mg total) by mouth 2 (two) times daily. - Oral   E-Prescribing Status: Receipt confirmed by pharmacy (05/12/2016 1:56 PM EDT)   Pharmacy   [id number] - [place], [place] - [address] AT [address]OSS FROM [address]

## 2016-09-06 ENCOUNTER — Other Ambulatory Visit: Payer: Self-pay | Admitting: Internal Medicine

## 2016-09-06 MED ORDER — POTASSIUM CHLORIDE CRYS ER 10 MEQ PO TBCR: 10.0000 meq | EXTENDED_RELEASE_TABLET | ORAL | 1 refills | Status: DC

## 2016-09-23 ENCOUNTER — Other Ambulatory Visit: Payer: Self-pay | Admitting: Internal Medicine

## 2016-10-09 ENCOUNTER — Telehealth: Payer: Self-pay | Admitting: Internal Medicine

## 2016-10-09 MED ORDER — FLECAINIDE ACETATE 50 MG PO TABS: 50.0000 mg | ORAL_TABLET | Freq: Two times a day (BID) | ORAL | 1 refills | Status: DC

## 2016-10-09 MED ORDER — ATORVASTATIN CALCIUM 40 MG PO TABS: ORAL_TABLET | ORAL | 1 refills | Status: DC

## 2016-10-09 MED ORDER — APIXABAN 5 MG PO TABS: 5.0000 mg | ORAL_TABLET | Freq: Two times a day (BID) | ORAL | 1 refills | Status: DC

## 2016-10-09 NOTE — Telephone Encounter (Signed)
New message    *STAT* If patient is at the pharmacy, call can be transferred to refill team.   1. Which medications need to be refilled? (please list name of each medication and dose if known) flecainide (TAMBOCOR) 50 MG tablet, ELIQUIS 5 MG TABS tablet, atorvastatin (LIPITOR) 40 MG tablet  2. Which pharmacy/location (including street and city if local pharmacy) is medication to be sent to? [facility] [address] [facility] [place]  3. Do they need a 30 day or 90 day supply? 30 day supply

## 2016-10-09 NOTE — Addendum Note (Signed)
Addended by: Demetrios Loll on: 10/09/2016 03:35 PM   Modules accepted: Orders

## 2016-11-03 ENCOUNTER — Encounter: Payer: Self-pay | Admitting: Internal Medicine

## 2016-11-03 ENCOUNTER — Ambulatory Visit (INDEPENDENT_AMBULATORY_CARE_PROVIDER_SITE_OTHER): Payer: BLUE CROSS/BLUE SHIELD | Admitting: Internal Medicine

## 2016-11-03 VITALS — BP 128/60 | HR 67 | Ht 67.0 in | Wt 172.4 lb

## 2016-11-03 DIAGNOSIS — I480 Paroxysmal atrial fibrillation: Secondary | ICD-10-CM

## 2016-11-03 MED ORDER — FLECAINIDE ACETATE 100 MG PO TABS: 100.0000 mg | ORAL_TABLET | Freq: Two times a day (BID) | ORAL | 3 refills | Status: DC

## 2016-11-03 NOTE — Patient Instructions (Addendum)
Medication Instructions:  Your physician has recommended you make the following change in your medication:  1) Increase Flecainide to 100 mg twice daily   Labwork: None ordered   Testing/Procedures: None ordered   Follow-Up:   Your physician recommends that you schedule a follow-up EKG Mon at our [facility] office at 1:30.  [address] [place], [address] [address] [phone]   Your physician wants you to follow-up in: 6 months with Dr Jacquiline Doe will receive a reminder letter in the mail two months in advance. If you don't receive a letter, please call our office to schedule the follow-up appointment.   Any Other Special Instructions Will Be Listed Below (If Applicable).     If you need a refill on your cardiac medications before your next appointment, please call your pharmacy.

## 2016-11-03 NOTE — Progress Notes (Signed)
PCP: [name], MD  Primary EP: Dr Johney Frame  Kiara [name] is a 56 y.o. female who presents today for routine electrophysiology followup.  She feels that she continues to have occasional afib episodes.  Since last being seen in our clinic, the patient reports doing very well.  Her daughter went to [place].  She plans to go and visit in December.  Today, she denies symptoms of chest pain, shortness of breath,  lower extremity edema, dizziness, presyncope, or syncope.  The patient is otherwise without complaint today.   Past Medical History:  Diagnosis Date  . Asthma   . Atrial fibrillation (HCC)    a. admx with AFib with RVR and acute systolic CHF 07/2013; TEE with LAA clot and no DCCV done;    Marland Kitchen Chronic systolic heart failure (HCC)   . Coronary artery disease    a.  LHC (07/17/13):  LM 20%, ostial CFX 20-30%  . Depression   . Hyperlipemia   . Hypothyroidism    past hx  . Nephrolithiasis 2013   "passed them"  . NICM (nonischemic cardiomyopathy) (HCC)    a. Echo (07/17/13):  EF 25-30%, diff HK, basal and mid anteroseptum/ basal inferoseptum and apical AK, trivial AI, mod MR, mod LAE, mild reduced RVSF, mild RAE  . Obesity   . Osteopenia    Past Surgical History:  Procedure Laterality Date  . ABDOMINAL HYSTERECTOMY  ~ 2001  . ABLATION  10/28/13   PVI by Dr Johney Frame  . APPENDECTOMY  1978  . ATRIAL FIBRILLATION ABLATION N/A 10/28/2013   Procedure: ATRIAL FIBRILLATION ABLATION;  Surgeon: Gardiner Rhyme, MD;  Location: [facility] CATH LAB;  Service: Cardiovascular;  Laterality: N/A;  . CARDIAC CATHETERIZATION  07/2013  . CARDIOVERSION N/A 08/18/2013   Procedure: CARDIOVERSION;  Surgeon: Donato Schultz, MD;  Location: [facility] ENDOSCOPY;  Service: Cardiovascular;  Laterality: N/A;  . CARDIOVERSION N/A 08/29/2013   Procedure: CARDIOVERSION;  Surgeon: Pricilla Riffle, MD;  Location: [facility] OR;  Service: Cardiovascular;  Laterality: N/A;  . ELECTROPHYSIOLOGIC STUDY N/A 11/05/2014   Procedure: Atrial Fibrillation  Ablation;  Surgeon: Hillis Range, MD;  Location: [facility] INVASIVE CV LAB;  Service: Cardiovascular;  Laterality: N/A;  . LEFT HEART CATHETERIZATION WITH CORONARY ANGIOGRAM N/A 07/17/2013   Procedure: LEFT HEART CATHETERIZATION WITH CORONARY ANGIOGRAM;  Surgeon: Micheline Chapman, MD;  Location: [facility] CATH LAB;  Service: Cardiovascular;  Laterality: N/A;  . LOOP RECORDER IMPLANT N/A 06/05/2014   Procedure: LOOP RECORDER IMPLANT;  Surgeon: Hillis Range, MD;  Location: [facility] CATH LAB;  Service: Cardiovascular;  Laterality: N/A;  . TEE WITHOUT CARDIOVERSION N/A 07/18/2013   Procedure: TRANSESOPHAGEAL ECHOCARDIOGRAM (TEE);  Surgeon: Lewayne Bunting, MD;  Location: [facility] ENDOSCOPY;  Service: Cardiovascular;  Laterality: N/A;  . TEE WITHOUT CARDIOVERSION N/A 08/18/2013   Procedure: TRANSESOPHAGEAL ECHOCARDIOGRAM (TEE);  Surgeon: Donato Schultz, MD;  Location: [facility] ENDOSCOPY;  Service: Cardiovascular;  Laterality: N/A;  . TEE WITHOUT CARDIOVERSION N/A 10/27/2013   Procedure: TRANSESOPHAGEAL ECHOCARDIOGRAM (TEE);  Surgeon: Pricilla Riffle, MD;  Location: [facility] ENDOSCOPY;  Service: Cardiovascular;  Laterality: N/A;  . TEE WITHOUT CARDIOVERSION N/A 11/04/2014   Procedure: TRANSESOPHAGEAL ECHOCARDIOGRAM (TEE);  Surgeon: Chrystie Nose, MD;  Location: [facility] ENDOSCOPY;  Service: Cardiovascular;  Laterality: N/A;    ROS- all systems are reviewed and negatives except as per HPI above  Current Outpatient Prescriptions  Medication Sig Dispense Refill  . acetaminophen (TYLENOL) 325 MG tablet Take 650-975 mg by mouth every 6 (six) hours as needed for mild  pain.     . apixaban (ELIQUIS) 5 MG TABS tablet Take 1 tablet (5 mg total) by mouth 2 (two) times daily. 60 tablet 1  . atorvastatin (LIPITOR) 40 MG tablet TAKE 1 TABLET OP EVERY DAY AT 6PM 90 tablet 1  . buPROPion (WELLBUTRIN XL) 150 MG 24 hr tablet Take 150 mg by mouth daily.    . flecainide (TAMBOCOR) 50 MG tablet Take 1 tablet (50 mg total) by mouth 2 (two) times daily. 180 tablet 1  .  furosemide (LASIX) 20 MG tablet TAKE 1 TABLET BY MOUTH EVERY OTHER DAY 15 tablet 1  . hydrOXYzine (VISTARIL) 25 MG capsule Take 25 mg by mouth at bedtime.    Marland Kitchen lisinopril (PRINIVIL,ZESTRIL) 5 MG tablet TAKE 1 TABLET BY MOUTH EVERY DAY 30 tablet 1  . lisinopril (PRINIVIL,ZESTRIL) 5 MG tablet Take 5 mg by mouth 3 (three) times a week.    . metoprolol tartrate (LOPRESSOR) 25 MG tablet Take 12.5 mg by mouth as directed.     . potassium chloride (K-DUR,KLOR-CON) 10 MEQ tablet Take 1 tablet (10 mEq total) by mouth every other day. When taking Lasix 45 tablet 1  . sertraline (ZOLOFT) 100 MG tablet Take 200 mg by mouth every morning.      No current facility-administered medications for this visit.     Physical Exam: Vitals:   11/03/16 1509  BP: 128/60  Pulse: 67  SpO2: 97%  Weight: 172 lb 6.4 oz (78.2 kg)  Height: 5\' 7"  (1.702 m)    GEN- The patient is well appearing, alert and oriented x 3 today.   Head- normocephalic, atraumatic Eyes-  Sclera clear, conjunctiva pink Ears- hearing intact Oropharynx- clear Lungs- Clear to ausculation bilaterally, normal work of breathing Heart- Regular rate and rhythm, no murmurs, rubs or gallops, PMI not laterally displaced GI- soft, NT, ND, + BS Extremities- no clubbing, cyanosis, or edema  EKG tracing ordered today is personally reviewed and shows sinus rhythm 67 bpm, otherwise normal ekg  ILR reviewed, AF burden is 2%  Assessment and Plan:  1. Persistent afib reasonably well controlled post ablation with flecainide (AF burden by ILR is 2%).  At her request, we will increase flecainide to 100mg  BID. Return for EKG on Monday Continued lifestyle change is encouraged  2. Nonischemic CM EF has recovered previously Will repeat echo upon return  Return to see me in 6 months [facility]  Hillis Range MD, [facility] 11/03/2016 3:26 PM

## 2016-11-03 NOTE — Addendum Note (Signed)
Addended by: Dennis Bast F on: 11/03/2016 03:56 PM   Modules accepted: Orders

## 2016-11-06 ENCOUNTER — Ambulatory Visit (INDEPENDENT_AMBULATORY_CARE_PROVIDER_SITE_OTHER): Payer: BLUE CROSS/BLUE SHIELD | Admitting: Cardiology

## 2016-11-06 DIAGNOSIS — I4891 Unspecified atrial fibrillation: Secondary | ICD-10-CM

## 2016-11-06 LAB — CUP PACEART INCLINIC DEVICE CHECK
Date Time Interrogation Session: 20180921194718
Implantable Pulse Generator Implant Date: 20160422

## 2016-11-06 NOTE — Progress Notes (Signed)
Patient arrived for EKG per Dr. Johney Frame after increasing flecainide to 100 mg twice daily. Yesterday patient states she became dizzy after moving her head fast while in the car, but vision came back very quickly. No other issues. EKG reviewed by Dr. Dulce Sellar. Advised patient EKG stable. Dr. Johney Frame and nurse made aware EKG is scanned to the chart for review.

## 2016-11-11 DIAGNOSIS — Z713 Dietary counseling and surveillance: Secondary | ICD-10-CM | POA: Diagnosis not present

## 2016-11-11 DIAGNOSIS — Z131 Encounter for screening for diabetes mellitus: Secondary | ICD-10-CM | POA: Diagnosis not present

## 2016-11-11 DIAGNOSIS — Z23 Encounter for immunization: Secondary | ICD-10-CM | POA: Diagnosis not present

## 2016-11-11 DIAGNOSIS — Z13220 Encounter for screening for lipoid disorders: Secondary | ICD-10-CM | POA: Diagnosis not present

## 2016-11-11 DIAGNOSIS — Z136 Encounter for screening for cardiovascular disorders: Secondary | ICD-10-CM | POA: Diagnosis not present

## 2016-11-19 ENCOUNTER — Other Ambulatory Visit: Payer: Self-pay | Admitting: Internal Medicine

## 2016-11-20 ENCOUNTER — Other Ambulatory Visit: Payer: Self-pay

## 2016-11-20 MED ORDER — METOPROLOL TARTRATE 25 MG PO TABS: 12.5000 mg | ORAL_TABLET | ORAL | 3 refills | Status: DC

## 2016-11-21 ENCOUNTER — Other Ambulatory Visit: Payer: Self-pay | Admitting: Internal Medicine

## 2016-11-22 NOTE — Telephone Encounter (Signed)
This is Dr. Munley's pt 

## 2016-12-05 DIAGNOSIS — E7800 Pure hypercholesterolemia, unspecified: Secondary | ICD-10-CM | POA: Diagnosis not present

## 2016-12-05 DIAGNOSIS — E559 Vitamin D deficiency, unspecified: Secondary | ICD-10-CM | POA: Diagnosis not present

## 2016-12-05 DIAGNOSIS — Z0000 Encounter for general adult medical examination without abnormal findings: Secondary | ICD-10-CM | POA: Diagnosis not present

## 2016-12-07 DIAGNOSIS — I4891 Unspecified atrial fibrillation: Secondary | ICD-10-CM | POA: Diagnosis not present

## 2016-12-07 DIAGNOSIS — I2510 Atherosclerotic heart disease of native coronary artery without angina pectoris: Secondary | ICD-10-CM | POA: Diagnosis not present

## 2016-12-07 DIAGNOSIS — E7800 Pure hypercholesterolemia, unspecified: Secondary | ICD-10-CM | POA: Diagnosis not present

## 2016-12-07 DIAGNOSIS — Z0000 Encounter for general adult medical examination without abnormal findings: Secondary | ICD-10-CM | POA: Diagnosis not present

## 2017-02-13 HISTORY — PX: BACK SURGERY: SHX140

## 2017-02-21 ENCOUNTER — Ambulatory Visit (HOSPITAL_COMMUNITY)
Admission: RE | Admit: 2017-02-21 | Discharge: 2017-02-21 | Disposition: A | Discharge status: Home / Self Care | Payer: BLUE CROSS/BLUE SHIELD | Source: Ambulatory Visit | Attending: Nurse Practitioner | Admitting: Nurse Practitioner

## 2017-02-21 ENCOUNTER — Telehealth: Payer: Self-pay | Admitting: Internal Medicine

## 2017-02-21 ENCOUNTER — Encounter (HOSPITAL_COMMUNITY): Payer: Self-pay | Admitting: Nurse Practitioner

## 2017-02-21 VITALS — BP 118/82 | HR 118 | Ht 67.0 in | Wt 175.6 lb

## 2017-02-21 DIAGNOSIS — I4892 Unspecified atrial flutter: Secondary | ICD-10-CM | POA: Diagnosis not present

## 2017-02-21 DIAGNOSIS — I5022 Chronic systolic (congestive) heart failure: Secondary | ICD-10-CM | POA: Insufficient documentation

## 2017-02-21 DIAGNOSIS — Z79899 Other long term (current) drug therapy: Secondary | ICD-10-CM | POA: Diagnosis not present

## 2017-02-21 DIAGNOSIS — F329 Major depressive disorder, single episode, unspecified: Secondary | ICD-10-CM | POA: Diagnosis not present

## 2017-02-21 DIAGNOSIS — I2510 Atherosclerotic heart disease of native coronary artery without angina pectoris: Secondary | ICD-10-CM | POA: Diagnosis not present

## 2017-02-21 DIAGNOSIS — Z7901 Long term (current) use of anticoagulants: Secondary | ICD-10-CM | POA: Insufficient documentation

## 2017-02-21 DIAGNOSIS — I480 Paroxysmal atrial fibrillation: Secondary | ICD-10-CM | POA: Diagnosis not present

## 2017-02-21 DIAGNOSIS — I429 Cardiomyopathy, unspecified: Secondary | ICD-10-CM | POA: Diagnosis not present

## 2017-02-21 DIAGNOSIS — M8580 Other specified disorders of bone density and structure, unspecified site: Secondary | ICD-10-CM | POA: Diagnosis not present

## 2017-02-21 DIAGNOSIS — J45909 Unspecified asthma, uncomplicated: Secondary | ICD-10-CM | POA: Insufficient documentation

## 2017-02-21 DIAGNOSIS — E785 Hyperlipidemia, unspecified: Secondary | ICD-10-CM | POA: Diagnosis not present

## 2017-02-21 DIAGNOSIS — E039 Hypothyroidism, unspecified: Secondary | ICD-10-CM | POA: Insufficient documentation

## 2017-02-21 NOTE — Telephone Encounter (Signed)
Kiara [name] is calling because Kiara [name] has been in and out of Afib and have been having some tightness in her chest for about a week . [place] .  Would like to come in to see someone today , Please call Kiara [name] at [phone].   Thanks

## 2017-02-21 NOTE — Patient Instructions (Signed)
Take metoprolol 1/2 tablet twice a day

## 2017-02-21 NOTE — Telephone Encounter (Signed)
Pt returned my call and she has been advised to call over to the [facility] Clinic, that [place] felt she could be seen faster there today than we had availability here in the office. Pt was provided with their phone #. Pt thankful for the helpfulness.

## 2017-02-21 NOTE — Telephone Encounter (Signed)
Called pt re: Afib, per Dennis Bast, RN, pt needs to call the [facility] [phone], that pt can be seen sooner there than here. Left a message for pt to call back.

## 2017-02-21 NOTE — Progress Notes (Signed)
Primary Care Physician: [name], MD EP: Dr.Allred Referring provider: [facility] triage   Kiara [name] is a 57 y.o. female with a h/o paroxysmal atrial fibrillation, s/p ablations x 2, 2015 and 2016. She has recently traveled to [place] and got home New Years Eve. She noted one short episode of afib while in [place], but seen she got home she has had paroxysmal afib with fatigue.  She is in aflutter today with v rate of 118 bpm. She continues on flecainide 100 mg bid but  also takes metoprolol as needed, took some last night but has not used much with recent afib issues. She has not missed any eliquis issues with her travels. She has noted some chest aching with afib episodes.   Today, she denies symptoms of palpitations, chest pain, shortness of breath, orthopnea, PND, lower extremity edema, dizziness, presyncope, syncope, or neurologic sequela. The patient is tolerating medications without difficulties and is otherwise without complaint today.   Past Medical History:  Diagnosis Date  . Asthma   . Atrial fibrillation (HCC)    a. admx with AFib with RVR and acute systolic CHF 07/2013; TEE with LAA clot and no DCCV done;    Marland Kitchen Chronic systolic heart failure (HCC)   . Coronary artery disease    a.  LHC (07/17/13):  LM 20%, ostial CFX 20-30%  . Depression   . Hyperlipemia   . Hypothyroidism    past hx  . Nephrolithiasis 2013   "passed them"  . NICM (nonischemic cardiomyopathy) (HCC)    a. Echo (07/17/13):  EF 25-30%, diff HK, basal and mid anteroseptum/ basal inferoseptum and apical AK, trivial AI, mod MR, mod LAE, mild reduced RVSF, mild RAE  . Obesity   . Osteopenia    Past Surgical History:  Procedure Laterality Date  . ABDOMINAL HYSTERECTOMY  ~ 2001  . ABLATION  10/28/13   PVI by Dr Johney Frame  . APPENDECTOMY  1978  . ATRIAL FIBRILLATION ABLATION N/A 10/28/2013   Procedure: ATRIAL FIBRILLATION ABLATION;  Surgeon: Gardiner Rhyme, MD;  Location: [facility] CATH LAB;  Service:  Cardiovascular;  Laterality: N/A;  . CARDIAC CATHETERIZATION  07/2013  . CARDIOVERSION N/A 08/18/2013   Procedure: CARDIOVERSION;  Surgeon: Donato Schultz, MD;  Location: [facility] ENDOSCOPY;  Service: Cardiovascular;  Laterality: N/A;  . CARDIOVERSION N/A 08/29/2013   Procedure: CARDIOVERSION;  Surgeon: Pricilla Riffle, MD;  Location: [facility] OR;  Service: Cardiovascular;  Laterality: N/A;  . ELECTROPHYSIOLOGIC STUDY N/A 11/05/2014   Procedure: Atrial Fibrillation Ablation;  Surgeon: Hillis Range, MD;  Location: [facility] INVASIVE CV LAB;  Service: Cardiovascular;  Laterality: N/A;  . LEFT HEART CATHETERIZATION WITH CORONARY ANGIOGRAM N/A 07/17/2013   Procedure: LEFT HEART CATHETERIZATION WITH CORONARY ANGIOGRAM;  Surgeon: Micheline Chapman, MD;  Location: [facility] CATH LAB;  Service: Cardiovascular;  Laterality: N/A;  . LOOP RECORDER IMPLANT N/A 06/05/2014   Procedure: LOOP RECORDER IMPLANT;  Surgeon: Hillis Range, MD;  Location: [facility] CATH LAB;  Service: Cardiovascular;  Laterality: N/A;  . TEE WITHOUT CARDIOVERSION N/A 07/18/2013   Procedure: TRANSESOPHAGEAL ECHOCARDIOGRAM (TEE);  Surgeon: Lewayne Bunting, MD;  Location: [facility] ENDOSCOPY;  Service: Cardiovascular;  Laterality: N/A;  . TEE WITHOUT CARDIOVERSION N/A 08/18/2013   Procedure: TRANSESOPHAGEAL ECHOCARDIOGRAM (TEE);  Surgeon: Donato Schultz, MD;  Location: [facility] ENDOSCOPY;  Service: Cardiovascular;  Laterality: N/A;  . TEE WITHOUT CARDIOVERSION N/A 10/27/2013   Procedure: TRANSESOPHAGEAL ECHOCARDIOGRAM (TEE);  Surgeon: Pricilla Riffle, MD;  Location: [facility] ENDOSCOPY;  Service: Cardiovascular;  Laterality: N/A;  .  TEE WITHOUT CARDIOVERSION N/A 11/04/2014   Procedure: TRANSESOPHAGEAL ECHOCARDIOGRAM (TEE);  Surgeon: Chrystie Nose, MD;  Location: [facility] ENDOSCOPY;  Service: Cardiovascular;  Laterality: N/A;    Current Outpatient Medications  Medication Sig Dispense Refill  . acetaminophen (TYLENOL) 325 MG tablet Take 650-975 mg by mouth every 6 (six) hours as needed for mild pain.     Marland Kitchen  atorvastatin (LIPITOR) 40 MG tablet TAKE 1 TABLET OP EVERY DAY AT 6PM 90 tablet 1  . buPROPion (WELLBUTRIN XL) 150 MG 24 hr tablet Take 150 mg by mouth daily.    Marland Kitchen ELIQUIS 5 MG TABS tablet TAKE 1 TABLET BY MOUTH TWICE A DAY 180 tablet 1  . flecainide (TAMBOCOR) 100 MG tablet Take 1 tablet (100 mg total) by mouth 2 (two) times daily. 90 tablet 3  . furosemide (LASIX) 20 MG tablet TAKE 1 TABLET BY MOUTH EVERY OTHER DAY 45 tablet 3  . hydrOXYzine (VISTARIL) 25 MG capsule Take 25 mg by mouth at bedtime.    Marland Kitchen lisinopril (PRINIVIL,ZESTRIL) 5 MG tablet TAKE 1 TABLET BY MOUTH EVERY DAY 90 tablet 1  . metoprolol tartrate (LOPRESSOR) 25 MG tablet Take 0.5 tablets (12.5 mg total) by mouth as directed. 90 tablet 3  . potassium chloride (K-DUR,KLOR-CON) 10 MEQ tablet Take 1 tablet (10 mEq total) by mouth every other day. When taking Lasix 45 tablet 1  . sertraline (ZOLOFT) 100 MG tablet Take 200 mg by mouth every morning.      No current facility-administered medications for this encounter.     Allergies  Allergen Reactions  . Azithromycin Diarrhea and Nausea And Vomiting    Social History   Socioeconomic History  . Marital status: Married    Spouse name: Not on file  . Number of children: Not on file  . Years of education: Not on file  . Highest education level: Not on file  Social Needs  . Financial resource strain: Not on file  . Food insecurity - worry: Not on file  . Food insecurity - inability: Not on file  . Transportation needs - medical: Not on file  . Transportation needs - non-medical: Not on file  Occupational History  . Not on file  Tobacco Use  . Smoking status: Never Smoker  . Smokeless tobacco: Never Used  Substance and Sexual Activity  . Alcohol use: No  . Drug use: No  . Sexual activity: Yes  Other Topics Concern  . Not on file  Social History Narrative  . Not on file    Family History  Problem Relation Age of Onset  . Hypertension Mother   . Lymphoma Mother     . Hypertension Father     ROS- All systems are reviewed and negative except as per the HPI above  Physical Exam: Vitals:   02/21/17 1359  BP: 118/82  Pulse: (!) 118  Weight: 175 lb 9.6 oz (79.7 kg)  Height: 5\' 7"  (1.702 m)   Wt Readings from Last 3 Encounters:  02/21/17 175 lb 9.6 oz (79.7 kg)  11/03/16 172 lb 6.4 oz (78.2 kg)  03/13/16 172 lb 6.4 oz (78.2 kg)    Labs: Lab Results  Component Value Date   NA 140 11/06/2014   K 3.4 (L) 11/06/2014   CL 110 11/06/2014   CO2 25 11/06/2014   GLUCOSE 95 11/06/2014   BUN 8 11/06/2014   CREATININE 0.52 11/06/2014   CALCIUM 8.7 (L) 11/06/2014   MG 2.2 08/29/2013   Lab Results  Component  Value Date   INR 1.04 07/16/2013   Lab Results  Component Value Date   CHOL 198 07/17/2013   HDL 59 07/17/2013   LDLCALC 125 (H) 07/17/2013   TRIG 69 07/17/2013     GEN- The patient is well appearing, alert and oriented x 3 today.   Head- normocephalic, atraumatic Eyes-  Sclera clear, conjunctiva pink Ears- hearing intact Oropharynx- clear Neck- supple, no JVP Lymph- no cervical lymphadenopathy Lungs- Clear to ausculation bilaterally, normal work of breathing Heart- irregular rate and rhythm, no murmurs, rubs or gallops, PMI not laterally displaced GI- soft, NT, ND, + BS Extremities- no clubbing, cyanosis, or edema MS- no significant deformity or atrophy Skin- no rash or lesion Psych- euthymic mood, full affect Neuro- strength and sensation are intact  EKG- aflutter with variable block, qrs int 100 ms, qtc 454 ms Epic records reviewed    Assessment and Plan: 1. Aflutter S/p ablation x 2 for afib EKG shows flutter today Coming in and out since return of trip from [place]  Continue flecainide 100 mg bid  Take metoprolol 12.5 mg bid on a regular basis and see if this won't encourage restoration of SR Continue Eliquis 5 mg bid, states no missed doses, for chadsvasc score of at least 2  F/u in one week, sooner if any  issues  Lupita Leash C. [facility] [address] [place], [place] [address] [phone]

## 2017-02-21 NOTE — Telephone Encounter (Signed)
Lpmtcb/md 1/9

## 2017-02-27 NOTE — Telephone Encounter (Signed)
Pt cld stating that she is feeling better and that she does not feel it necessary to keep her appt with afib clinic.  Pt canceled appt.

## 2017-02-28 ENCOUNTER — Ambulatory Visit (HOSPITAL_COMMUNITY): Payer: BLUE CROSS/BLUE SHIELD | Admitting: Nurse Practitioner

## 2017-04-05 DIAGNOSIS — Z6828 Body mass index (BMI) 28.0-28.9, adult: Secondary | ICD-10-CM | POA: Diagnosis not present

## 2017-04-05 DIAGNOSIS — Z1231 Encounter for screening mammogram for malignant neoplasm of breast: Secondary | ICD-10-CM | POA: Diagnosis not present

## 2017-04-05 DIAGNOSIS — Z01419 Encounter for gynecological examination (general) (routine) without abnormal findings: Secondary | ICD-10-CM | POA: Diagnosis not present

## 2017-04-06 ENCOUNTER — Other Ambulatory Visit: Payer: Self-pay | Admitting: Obstetrics and Gynecology

## 2017-04-06 DIAGNOSIS — E041 Nontoxic single thyroid nodule: Secondary | ICD-10-CM

## 2017-04-30 ENCOUNTER — Ambulatory Visit
Admission: RE | Admit: 2017-04-30 | Discharge: 2017-04-30 | Disposition: A | Discharge status: Home / Self Care | Payer: BLUE CROSS/BLUE SHIELD | Source: Ambulatory Visit | Attending: Obstetrics and Gynecology | Admitting: Obstetrics and Gynecology

## 2017-04-30 DIAGNOSIS — E041 Nontoxic single thyroid nodule: Secondary | ICD-10-CM | POA: Diagnosis not present

## 2017-08-08 ENCOUNTER — Ambulatory Visit: Payer: BLUE CROSS/BLUE SHIELD | Admitting: Internal Medicine

## 2017-08-08 ENCOUNTER — Other Ambulatory Visit: Payer: Self-pay | Admitting: Internal Medicine

## 2017-08-11 ENCOUNTER — Other Ambulatory Visit: Payer: Self-pay | Admitting: Internal Medicine

## 2017-08-13 ENCOUNTER — Ambulatory Visit: Payer: BLUE CROSS/BLUE SHIELD | Admitting: Internal Medicine

## 2017-08-13 ENCOUNTER — Encounter: Payer: Self-pay | Admitting: Internal Medicine

## 2017-08-13 VITALS — BP 122/68 | HR 61 | Ht 67.0 in | Wt 181.0 lb

## 2017-08-13 DIAGNOSIS — I480 Paroxysmal atrial fibrillation: Secondary | ICD-10-CM

## 2017-08-13 LAB — CUP PACEART INCLINIC DEVICE CHECK
Date Time Interrogation Session: 20190701121115
Implantable Pulse Generator Implant Date: 20160422

## 2017-08-13 NOTE — Patient Instructions (Addendum)
Medication Instructions:  Your physician recommends that you continue on your current medications as directed. Please refer to the Current Medication list given to you today.  Labwork: None ordered  Testing/Procedures: Your physician has requested that you have an echocardiogram in 6 months, prior to your follow up with Dr. Johney Frame. Echocardiography is a painless test that uses sound waves to create images of your heart. It provides your doctor with information about the size and shape of your heart and how well your heart's chambers and valves are working. This procedure takes approximately one hour. There are no restrictions for this procedure.  Follow-Up: Your physician wants you to follow-up in: 6 months with Dr. Johney Frame - after you have completed your echocardiogram.  You will receive a reminder letter in the mail two months in advance. If you don't receive a letter, please call our office to schedule the follow-up appointment.   * If you need a refill on your cardiac medications before your next appointment, please call your pharmacy.   *Please note that any paperwork needing to be filled out by the provider will need to be addressed at the front desk prior to seeing the provider. Please note that any FMLA, disability or other documents regarding health condition is subject to a $25.00 charge that must be received prior to completion of paperwork in the form of a money order or check.  Thank you for choosing [facility]!!

## 2017-08-13 NOTE — Progress Notes (Signed)
PCP: [name], MD   Primary EP: Dr Johney Frame  Kiara [name] is a 57 y.o. female who presents today for routine electrophysiology followup.  Since last being seen in our clinic, the patient reports doing very well.  Today, she denies symptoms of palpitations, chest pain, shortness of breath,  lower extremity edema, dizziness, presyncope, or syncope.  The patient is otherwise without complaint today.   Past Medical History:  Diagnosis Date  . Asthma   . Atrial fibrillation (HCC)    a. admx with AFib with RVR and acute systolic CHF 07/2013; TEE with LAA clot and no DCCV done;    Marland Kitchen Chronic systolic heart failure (HCC)   . Coronary artery disease    a.  LHC (07/17/13):  LM 20%, ostial CFX 20-30%  . Depression   . Hyperlipemia   . Hypothyroidism    past hx  . Nephrolithiasis 2013   "passed them"  . NICM (nonischemic cardiomyopathy) (HCC)    a. Echo (07/17/13):  EF 25-30%, diff HK, basal and mid anteroseptum/ basal inferoseptum and apical AK, trivial AI, mod MR, mod LAE, mild reduced RVSF, mild RAE  . Obesity   . Osteopenia    Past Surgical History:  Procedure Laterality Date  . ABDOMINAL HYSTERECTOMY  ~ 2001  . ABLATION  10/28/13   PVI by Dr Johney Frame  . APPENDECTOMY  1978  . ATRIAL FIBRILLATION ABLATION N/A 10/28/2013   Procedure: ATRIAL FIBRILLATION ABLATION;  Surgeon: Gardiner Rhyme, MD;  Location: [facility] CATH LAB;  Service: Cardiovascular;  Laterality: N/A;  . CARDIAC CATHETERIZATION  07/2013  . CARDIOVERSION N/A 08/18/2013   Procedure: CARDIOVERSION;  Surgeon: Donato Schultz, MD;  Location: [facility] ENDOSCOPY;  Service: Cardiovascular;  Laterality: N/A;  . CARDIOVERSION N/A 08/29/2013   Procedure: CARDIOVERSION;  Surgeon: Pricilla Riffle, MD;  Location: [facility] OR;  Service: Cardiovascular;  Laterality: N/A;  . ELECTROPHYSIOLOGIC STUDY N/A 11/05/2014   Procedure: Atrial Fibrillation Ablation;  Surgeon: Hillis Range, MD;  Location: [facility] INVASIVE CV LAB;  Service: Cardiovascular;  Laterality: N/A;  . LEFT  HEART CATHETERIZATION WITH CORONARY ANGIOGRAM N/A 07/17/2013   Procedure: LEFT HEART CATHETERIZATION WITH CORONARY ANGIOGRAM;  Surgeon: Micheline Chapman, MD;  Location: [facility] CATH LAB;  Service: Cardiovascular;  Laterality: N/A;  . LOOP RECORDER IMPLANT N/A 06/05/2014   Procedure: LOOP RECORDER IMPLANT;  Surgeon: Hillis Range, MD;  Location: [facility] CATH LAB;  Service: Cardiovascular;  Laterality: N/A;  . TEE WITHOUT CARDIOVERSION N/A 07/18/2013   Procedure: TRANSESOPHAGEAL ECHOCARDIOGRAM (TEE);  Surgeon: Lewayne Bunting, MD;  Location: [facility] ENDOSCOPY;  Service: Cardiovascular;  Laterality: N/A;  . TEE WITHOUT CARDIOVERSION N/A 08/18/2013   Procedure: TRANSESOPHAGEAL ECHOCARDIOGRAM (TEE);  Surgeon: Donato Schultz, MD;  Location: [facility] ENDOSCOPY;  Service: Cardiovascular;  Laterality: N/A;  . TEE WITHOUT CARDIOVERSION N/A 10/27/2013   Procedure: TRANSESOPHAGEAL ECHOCARDIOGRAM (TEE);  Surgeon: Pricilla Riffle, MD;  Location: [facility] ENDOSCOPY;  Service: Cardiovascular;  Laterality: N/A;  . TEE WITHOUT CARDIOVERSION N/A 11/04/2014   Procedure: TRANSESOPHAGEAL ECHOCARDIOGRAM (TEE);  Surgeon: Chrystie Nose, MD;  Location: [facility] ENDOSCOPY;  Service: Cardiovascular;  Laterality: N/A;    ROS- all systems are reviewed and negatives except as per HPI above  Current Outpatient Medications  Medication Sig Dispense Refill  . acetaminophen (TYLENOL) 325 MG tablet Take 650-975 mg by mouth every 6 (six) hours as needed for mild pain.     Marland Kitchen buPROPion (WELLBUTRIN XL) 150 MG 24 hr tablet Take 150 mg by mouth daily.    Marland Kitchen  ELIQUIS 5 MG TABS tablet TAKE 1 TABLET BY MOUTH TWICE A DAY 180 tablet 1  . flecainide (TAMBOCOR) 100 MG tablet Take 1 tablet (100 mg total) by mouth 2 (two) times daily. Please keep upcoming appt in July for future refills. Thank you 180 tablet 0  . furosemide (LASIX) 20 MG tablet TAKE 1 TABLET BY MOUTH EVERY OTHER DAY 45 tablet 3  . hydrOXYzine (VISTARIL) 25 MG capsule Take 12.5 mg by mouth at bedtime.     Marland Kitchen lisinopril  (PRINIVIL,ZESTRIL) 5 MG tablet TAKE 1 TABLET BY MOUTH EVERY DAY 90 tablet 1  . metoprolol tartrate (LOPRESSOR) 25 MG tablet Take 0.5 tablets (12.5 mg total) by mouth as directed. (Patient taking differently: Take 25 mg by mouth as directed. ) 90 tablet 3  . potassium chloride (K-DUR,KLOR-CON) 10 MEQ tablet Take 1 tablet (10 mEq total) by mouth every other day. When taking Lasix 45 tablet 1  . sertraline (ZOLOFT) 100 MG tablet Take 200 mg by mouth every morning.      No current facility-administered medications for this visit.     Physical Exam: Vitals:   08/13/17 1054  BP: 122/68  Pulse: 61  Weight: 181 lb (82.1 kg)  Height: 5\' 7"  (1.702 m)    GEN- The patient is well appearing, alert and oriented x 3 today.   Head- normocephalic, atraumatic Eyes-  Sclera clear, conjunctiva pink Ears- hearing intact Oropharynx- clear Lungs- Clear to ausculation bilaterally, normal work of breathing Heart- Regular rate and rhythm, no murmurs, rubs or gallops, PMI not laterally displaced GI- soft, NT, ND, + BS Extremities- no clubbing, cyanosis, or edema  Wt Readings from Last 3 Encounters:  08/13/17 181 lb (82.1 kg)  02/21/17 175 lb 9.6 oz (79.7 kg)  11/03/16 172 lb 6.4 oz (78.2 kg)    EKG tracing ordered today is personally reviewed and shows sinus rhythm 61 bpm, PR 162 msec, QRS 98 msec, Qtc 422 msec, nonspecific St/T changes  Assessment and Plan:  1. Persistent afib/ atrial flutter She presented to AF clinic 02/21/17 with atypical atrial flutter Doing well with flecainide (AF burden is 3% by ILR--> 2% on last visit 9/18) ILR is at EOS.  We discussed options of removal and replacement at length today.  She is leaning towards have a new device implanted upon return but will continue to think about this in the interim. No changes today  2. Nonischemic CM EF has normalized with sinus rhythm update echo on return   Return in 6 months  Hillis Range MD, [facility] 08/13/2017 11:36 AM

## 2017-09-18 ENCOUNTER — Other Ambulatory Visit: Payer: Self-pay | Admitting: Internal Medicine

## 2017-10-13 DIAGNOSIS — M62830 Muscle spasm of back: Secondary | ICD-10-CM | POA: Diagnosis not present

## 2017-10-13 DIAGNOSIS — M545 Low back pain: Secondary | ICD-10-CM | POA: Diagnosis not present

## 2017-10-17 DIAGNOSIS — M62830 Muscle spasm of back: Secondary | ICD-10-CM | POA: Diagnosis not present

## 2017-10-17 DIAGNOSIS — M545 Low back pain: Secondary | ICD-10-CM | POA: Diagnosis not present

## 2017-10-30 ENCOUNTER — Other Ambulatory Visit: Payer: Self-pay | Admitting: Internal Medicine

## 2017-10-30 DIAGNOSIS — I428 Other cardiomyopathies: Secondary | ICD-10-CM | POA: Insufficient documentation

## 2017-10-30 DIAGNOSIS — M5136 Other intervertebral disc degeneration, lumbar region: Secondary | ICD-10-CM

## 2017-10-30 DIAGNOSIS — M5416 Radiculopathy, lumbar region: Secondary | ICD-10-CM | POA: Diagnosis not present

## 2017-10-30 DIAGNOSIS — M4150 Other secondary scoliosis, site unspecified: Secondary | ICD-10-CM

## 2017-10-30 DIAGNOSIS — Z7901 Long term (current) use of anticoagulants: Secondary | ICD-10-CM | POA: Insufficient documentation

## 2017-10-30 DIAGNOSIS — M4186 Other forms of scoliosis, lumbar region: Secondary | ICD-10-CM | POA: Diagnosis not present

## 2017-10-30 DIAGNOSIS — M5442 Lumbago with sciatica, left side: Secondary | ICD-10-CM | POA: Diagnosis not present

## 2017-10-30 DIAGNOSIS — M5116 Intervertebral disc disorders with radiculopathy, lumbar region: Secondary | ICD-10-CM | POA: Diagnosis not present

## 2017-10-30 DIAGNOSIS — M51369 Other intervertebral disc degeneration, lumbar region without mention of lumbar back pain or lower extremity pain: Secondary | ICD-10-CM

## 2017-10-30 DIAGNOSIS — M4316 Spondylolisthesis, lumbar region: Secondary | ICD-10-CM | POA: Diagnosis not present

## 2017-10-30 HISTORY — DX: Spondylolisthesis, lumbar region: M43.16

## 2017-10-30 HISTORY — DX: Other secondary scoliosis, site unspecified: M41.50

## 2017-10-30 HISTORY — DX: Other intervertebral disc degeneration, lumbar region without mention of lumbar back pain or lower extremity pain: M51.369

## 2017-11-13 DIAGNOSIS — Z79891 Long term (current) use of opiate analgesic: Secondary | ICD-10-CM | POA: Diagnosis not present

## 2017-11-13 DIAGNOSIS — M5416 Radiculopathy, lumbar region: Secondary | ICD-10-CM | POA: Diagnosis not present

## 2017-11-13 DIAGNOSIS — M4156 Other secondary scoliosis, lumbar region: Secondary | ICD-10-CM | POA: Diagnosis not present

## 2017-11-13 DIAGNOSIS — M5136 Other intervertebral disc degeneration, lumbar region: Secondary | ICD-10-CM | POA: Diagnosis not present

## 2017-11-25 DIAGNOSIS — M5416 Radiculopathy, lumbar region: Secondary | ICD-10-CM | POA: Diagnosis not present

## 2017-11-25 DIAGNOSIS — M545 Low back pain: Secondary | ICD-10-CM | POA: Diagnosis not present

## 2017-11-25 DIAGNOSIS — M5442 Lumbago with sciatica, left side: Secondary | ICD-10-CM | POA: Diagnosis not present

## 2017-11-25 DIAGNOSIS — M5136 Other intervertebral disc degeneration, lumbar region: Secondary | ICD-10-CM | POA: Diagnosis not present

## 2017-11-25 DIAGNOSIS — M4150 Other secondary scoliosis, site unspecified: Secondary | ICD-10-CM | POA: Diagnosis not present

## 2017-11-28 ENCOUNTER — Telehealth: Payer: Self-pay | Admitting: Internal Medicine

## 2017-11-28 DIAGNOSIS — M5416 Radiculopathy, lumbar region: Secondary | ICD-10-CM

## 2017-11-28 DIAGNOSIS — M48061 Spinal stenosis, lumbar region without neurogenic claudication: Secondary | ICD-10-CM | POA: Diagnosis not present

## 2017-11-28 DIAGNOSIS — R32 Unspecified urinary incontinence: Secondary | ICD-10-CM

## 2017-11-28 DIAGNOSIS — R29898 Other symptoms and signs involving the musculoskeletal system: Secondary | ICD-10-CM | POA: Insufficient documentation

## 2017-11-28 HISTORY — DX: Radiculopathy, lumbar region: M54.16

## 2017-11-28 HISTORY — DX: Unspecified urinary incontinence: R32

## 2017-11-28 HISTORY — DX: Other symptoms and signs involving the musculoskeletal system: R29.898

## 2017-11-28 NOTE — Telephone Encounter (Signed)
No message needed °

## 2017-11-29 DIAGNOSIS — Z131 Encounter for screening for diabetes mellitus: Secondary | ICD-10-CM | POA: Diagnosis not present

## 2017-11-29 DIAGNOSIS — Z13220 Encounter for screening for lipoid disorders: Secondary | ICD-10-CM | POA: Diagnosis not present

## 2017-11-29 DIAGNOSIS — Z713 Dietary counseling and surveillance: Secondary | ICD-10-CM | POA: Diagnosis not present

## 2017-11-29 DIAGNOSIS — Z136 Encounter for screening for cardiovascular disorders: Secondary | ICD-10-CM | POA: Diagnosis not present

## 2017-11-30 DIAGNOSIS — E7800 Pure hypercholesterolemia, unspecified: Secondary | ICD-10-CM | POA: Diagnosis not present

## 2017-11-30 DIAGNOSIS — E559 Vitamin D deficiency, unspecified: Secondary | ICD-10-CM | POA: Diagnosis not present

## 2017-12-03 ENCOUNTER — Telehealth: Payer: Self-pay

## 2017-12-03 ENCOUNTER — Other Ambulatory Visit: Payer: Self-pay | Admitting: Neurological Surgery

## 2017-12-03 DIAGNOSIS — M48062 Spinal stenosis, lumbar region with neurogenic claudication: Secondary | ICD-10-CM | POA: Diagnosis not present

## 2017-12-03 NOTE — Telephone Encounter (Signed)
   [name] Pre-operative Risk Assessment    Request for surgical clearance:  1. What type of surgery is being performed?  Laminectomy/foraminotomy   2. When is this surgery scheduled?  12/12/2017   3. What type of clearance is required (medical clearance vs. Pharmacy clearance to hold med vs. Both)?  Both  4. Are there any medications that need to be held prior to surgery and how long? Eliquis   5. Practice name and name of physician performing surgery?  [facility] B[facility]   6. What is your office phone [phone]    7.   What is your office fax number [phone] Attn Janett Billow  8.   Anesthesia type (None, local, MAC, general) ? General   Kiara [name] 12/03/2017, 4:40 PM  _________________________________________________________________   (provider comments below)

## 2017-12-04 NOTE — Telephone Encounter (Signed)
Pt takes Eliquis for afib with CHADSVASc score of 3 (sex, CHF, CAD). Renal function is normal. Recommend holding Eliquis for 3 days prior to spinal procedure per protocol.

## 2017-12-04 NOTE — Telephone Encounter (Signed)
   Primary Cardiologist: Hillis Range, MD  Chart reviewed as part of pre-operative protocol coverage. Will forward to pharmD for input given surgery date 10/20 then pt will need call.  Laurann Montana, PA-C 12/04/2017, 4:04 PM

## 2017-12-04 NOTE — Pre-Procedure Instructions (Signed)
Kiara [name]  12/04/2017      [id number] [place], [place] - [address]CROSS [address] [place] [address] Phone: [phone] Fax: [phone]    Your procedure is scheduled on Wednesday October 30.  Report to [facility] Admitting at 5:30 A.M.  Call this number if you have problems the morning of surgery:  [phone]   Remember:  Do not eat or drink after midnight.    Take these medicines the morning of surgery with A SIP OF WATER:   Flecainide (Tambocor) Bupropion (Wellbutrin) Sertraline (Zoloft) Metoprolol (Lopressor) if needed Acetaminophen (tylenol) if needed Tramadol (ultram) if needed  7 days prior to surgery STOP taking any Aspirin(unless otherwise instructed by your surgeon), Aleve, Naproxen, Ibuprofen, Motrin, Advil, Goody's, BC's, all herbal medications, fish oil, and all vitamins  FOLLOW your surgeons instructions on when to stop taking Eliquis    Do not wear jewelry, make-up or nail polish.  Do not wear lotions, powders, or perfumes, or deodorant.  Do not shave 48 hours prior to surgery.  Men may shave face and neck.  Do not bring valuables to the hospital.  [facility] is not responsible for any belongings or valuables.  Contacts, dentures or bridgework may not be worn into surgery.  Leave your suitcase in the car.  After surgery it may be brought to your room.  For patients admitted to the hospital, discharge time will be determined by your treatment team.  Patients discharged the day of surgery will not be allowed to drive home.   Special instructions:    [place]- Preparing For Surgery  Before surgery, you can play an important role. Because skin is not sterile, your skin needs to be as free of germs as possible. You can reduce the number of germs on your skin by washing with CHG (chlorahexidine gluconate) Soap before surgery.  CHG is an antiseptic cleaner which kills germs  and bonds with the skin to continue killing germs even after washing.    Oral Hygiene is also important to reduce your risk of infection.  Remember - BRUSH YOUR TEETH THE MORNING OF SURGERY WITH YOUR REGULAR TOOTHPASTE  Please do not use if you have an allergy to CHG or antibacterial soaps. If your skin becomes reddened/irritated stop using the CHG.  Do not shave (including legs and underarms) for at least 48 hours prior to first CHG shower. It is OK to shave your face.  Please follow these instructions carefully.   1. Shower the NIGHT BEFORE SURGERY and the MORNING OF SURGERY with CHG.   2. If you chose to wash your hair, wash your hair first as usual with your normal shampoo.  3. After you shampoo, rinse your hair and body thoroughly to remove the shampoo.  4. Use CHG as you would any other liquid soap. You can apply CHG directly to the skin and wash gently with a scrungie or a clean washcloth.   5. Apply the CHG Soap to your body ONLY FROM THE NECK DOWN.  Do not use on open wounds or open sores. Avoid contact with your eyes, ears, mouth and genitals (private parts). Wash Face and genitals (private parts)  with your normal soap.  6. Wash thoroughly, paying special attention to the area where your surgery will be performed.  7. Thoroughly rinse your body with warm water from the neck down.  8. DO NOT shower/wash with your normal  soap after using and rinsing off the CHG Soap.  9. Pat yourself dry with a CLEAN TOWEL.  10. Wear CLEAN PAJAMAS to bed the night before surgery, wear comfortable clothes the morning of surgery  11. Place CLEAN SHEETS on your bed the night of your first shower and DO NOT SLEEP WITH PETS.    Day of Surgery:  Do not apply any deodorants/lotions.  Please wear clean clothes to the hospital/surgery center.   Remember to brush your teeth WITH YOUR REGULAR TOOTHPASTE.    Please read over the following fact sheets that you were given. Coughing and Deep  Breathing, MRSA Information and Surgical Site Infection Prevention

## 2017-12-05 DIAGNOSIS — Z23 Encounter for immunization: Secondary | ICD-10-CM | POA: Diagnosis not present

## 2017-12-05 DIAGNOSIS — E7800 Pure hypercholesterolemia, unspecified: Secondary | ICD-10-CM | POA: Diagnosis not present

## 2017-12-05 DIAGNOSIS — Z0000 Encounter for general adult medical examination without abnormal findings: Secondary | ICD-10-CM | POA: Diagnosis not present

## 2017-12-05 DIAGNOSIS — Z7901 Long term (current) use of anticoagulants: Secondary | ICD-10-CM | POA: Diagnosis not present

## 2017-12-05 DIAGNOSIS — I4891 Unspecified atrial fibrillation: Secondary | ICD-10-CM | POA: Diagnosis not present

## 2017-12-05 NOTE — Telephone Encounter (Signed)
   Primary Cardiologist: Hillis Range, MD  Chart reviewed as part of pre-operative protocol coverage. Patient was contacted 12/05/2017 in reference to pre-operative risk assessment for pending surgery as outlined below.  Kiara [name] was last seen on 08/13/2017 by Dr. Johney Frame.  Since that day, Kiara [name] has done well without any chest pain or SOB. Her functional ability is limited by severe back pain  Therefore, based on ACC/AHA guidelines, the patient would be at acceptable risk for the planned procedure without further cardiovascular testing.   I will route this recommendation to the requesting party via Epic fax function and remove from pre-op pool.  Please call with questions.  Per recommendation by our clinical pharmacist: "Pt takes Eliquis for afib with CHADSVASc score of 3 (sex, CHF, CAD). Renal function is normal. Recommend holding Eliquis for 3 days prior to spinal procedure per protocol."   Azalee Course, PA 12/05/2017, 11:47 AM

## 2017-12-05 NOTE — Pre-Procedure Instructions (Signed)
TOMMIE DEJOSEPH  12/05/2017      [id number] - [place], [place] - [address]OSS [address] [place] [address] Phone: [phone] Fax: [phone]    Your procedure is scheduled on Wednesday October 30th   Report to [facility] Admitting at 5:30 A.M.             (posted surgery time 7:30a - 9:09a)   Call this number if you have problems the morning of surgery:  [phone]   Remember:   Do not eat any foods  or drink any liquids after midnight, Tuesday.    Take these medicines the morning of surgery with A SIP OF WATER:   Flecainide (Tambocor) Bupropion (Wellbutrin) Sertraline (Zoloft) Metoprolol (Lopressor) if needed Acetaminophen (tylenol) if needed Tramadol (ultram) if needed  7 days prior to surgery STOP taking any Aspirin(unless otherwise instructed by your surgeon), Aleve, Naproxen, Ibuprofen, Motrin, Advil, Goody's, BC's, all herbal medications, fish oil, and all vitamins  FOLLOW your surgeons instructions on when to stop taking Eliquis    Do not wear jewelry, make-up or nail polish.  Do not wear lotions, powders, or perfumes, or deodorant.  Do not shave 48 hours prior to surgery.    Do not bring valuables to the hospital.  [facility] is not responsible for any belongings or valuables.  Contacts, dentures or bridgework may not be worn into surgery.  Leave your suitcase in the car.  After surgery it may be brought to your room.  For patients admitted to the hospital, discharge time will be determined by your treatment team.     Special instructions:    [place]- Preparing For Surgery  Before surgery, you can play an important role. Because skin is not sterile, your skin needs to be as free of germs as possible. You can reduce the number of germs on your skin by washing with CHG (chlorahexidine gluconate) Soap before surgery.  CHG is an antiseptic cleaner which kills germs and bonds  with the skin to continue killing germs even after washing.    Oral Hygiene is also important to reduce your risk of infection.    Remember - BRUSH YOUR TEETH THE MORNING OF SURGERY WITH YOUR REGULAR TOOTHPASTE  Please do not use if you have an allergy to CHG or antibacterial soaps. If your skin becomes reddened/irritated stop using the CHG.  Do not shave (including legs and underarms) for at least 48 hours prior to first CHG shower. It is OK to shave your face.  Please follow these instructions carefully.   1. Shower the NIGHT BEFORE SURGERY and the MORNING OF SURGERY with CHG.   2. If you chose to wash your hair, wash your hair first as usual with your normal shampoo.  3. After you shampoo, rinse your hair and body thoroughly to remove the shampoo.  4. Use CHG as you would any other liquid soap. You can apply CHG directly to the skin and wash gently with a scrungie or a clean washcloth.   5. Apply the CHG Soap to your body ONLY FROM THE NECK DOWN.  Do not use on open wounds or open sores. Avoid contact with your eyes, ears, mouth and genitals (private parts). Wash Face and genitals (private parts)  with your normal soap.  6. Wash thoroughly, paying special attention to the area where your surgery will be performed.  7. Thoroughly rinse your body with warm water  from the neck down.  8. DO NOT shower/wash with your normal soap after using and rinsing off the CHG Soap.  9. Pat yourself dry with a CLEAN TOWEL.  10. Wear CLEAN PAJAMAS to bed the night before surgery, wear comfortable clothes the morning of surgery  11. Place CLEAN SHEETS on your bed the night of your first shower and DO NOT SLEEP WITH PETS.  Day of Surgery:  Do not apply any deodorants/lotions.  Please wear clean clothes to the hospital/surgery center.    Remember to brush your teeth WITH YOUR REGULAR TOOTHPASTE.  Please read over the following fact sheets that you were given. MRSA Information and Surgical Site  Infection Prevention

## 2017-12-06 ENCOUNTER — Inpatient Hospital Stay (HOSPITAL_COMMUNITY)
Admission: RE | Admit: 2017-12-06 | Discharge: 2017-12-06 | Disposition: A | Discharge status: Home / Self Care | Payer: BLUE CROSS/BLUE SHIELD | Source: Ambulatory Visit

## 2017-12-12 ENCOUNTER — Ambulatory Visit (HOSPITAL_COMMUNITY)
Admission: RE | Admit: 2017-12-12 | Payer: BLUE CROSS/BLUE SHIELD | Source: Ambulatory Visit | Admitting: Neurological Surgery

## 2017-12-12 ENCOUNTER — Encounter (HOSPITAL_COMMUNITY): Admission: RE | Payer: Self-pay | Source: Ambulatory Visit

## 2017-12-12 DIAGNOSIS — M48061 Spinal stenosis, lumbar region without neurogenic claudication: Secondary | ICD-10-CM | POA: Diagnosis not present

## 2017-12-12 DIAGNOSIS — M48062 Spinal stenosis, lumbar region with neurogenic claudication: Secondary | ICD-10-CM | POA: Diagnosis not present

## 2017-12-12 DIAGNOSIS — M4316 Spondylolisthesis, lumbar region: Secondary | ICD-10-CM | POA: Diagnosis not present

## 2017-12-12 SURGERY — LUMBAR LAMINECTOMY/DECOMPRESSION MICRODISCECTOMY 1 LEVEL
Anesthesia: General | Site: Back

## 2018-01-22 DIAGNOSIS — M4316 Spondylolisthesis, lumbar region: Secondary | ICD-10-CM | POA: Diagnosis not present

## 2018-02-11 ENCOUNTER — Other Ambulatory Visit: Payer: Self-pay

## 2018-02-11 ENCOUNTER — Ambulatory Visit (HOSPITAL_COMMUNITY): Payer: BLUE CROSS/BLUE SHIELD | Attending: Cardiology

## 2018-02-11 DIAGNOSIS — I480 Paroxysmal atrial fibrillation: Secondary | ICD-10-CM | POA: Insufficient documentation

## 2018-02-18 ENCOUNTER — Encounter: Payer: Self-pay | Admitting: Internal Medicine

## 2018-02-18 ENCOUNTER — Ambulatory Visit: Payer: BLUE CROSS/BLUE SHIELD | Admitting: Internal Medicine

## 2018-02-18 VITALS — BP 138/84 | HR 66 | Ht 67.0 in | Wt 186.2 lb

## 2018-02-18 DIAGNOSIS — I480 Paroxysmal atrial fibrillation: Secondary | ICD-10-CM | POA: Diagnosis not present

## 2018-02-18 DIAGNOSIS — I4892 Unspecified atrial flutter: Secondary | ICD-10-CM

## 2018-02-18 NOTE — Patient Instructions (Addendum)

## 2018-02-18 NOTE — Progress Notes (Signed)
PCP: [name], MD   Primary EP: Dr Johney Frame  Kiara [name] is a 58 y.o. female who presents today for routine electrophysiology followup.  Since last being seen in our clinic, the patient reports doing very well.  She is recovering from back surgery which she had in October.  Her AF burden is stable.  Today, she denies symptoms of palpitations, chest pain, shortness of breath,  lower extremity edema, dizziness, presyncope, or syncope.  The patient is otherwise without complaint today.   Past Medical History:  Diagnosis Date  . Asthma   . Atrial fibrillation (HCC)    a. admx with AFib with RVR and acute systolic CHF 07/2013; TEE with LAA clot and no DCCV done;    Marland Kitchen Chronic systolic heart failure (HCC)   . Coronary artery disease    a.  LHC (07/17/13):  LM 20%, ostial CFX 20-30%  . Depression   . Hyperlipemia   . Hypothyroidism    past hx  . Nephrolithiasis 2013   "passed them"  . NICM (nonischemic cardiomyopathy) (HCC)    a. Echo (07/17/13):  EF 25-30%, diff HK, basal and mid anteroseptum/ basal inferoseptum and apical AK, trivial AI, mod MR, mod LAE, mild reduced RVSF, mild RAE  . Obesity   . Osteopenia    Past Surgical History:  Procedure Laterality Date  . ABDOMINAL HYSTERECTOMY  ~ 2001  . ABLATION  10/28/13   PVI by Dr Johney Frame  . APPENDECTOMY  1978  . ATRIAL FIBRILLATION ABLATION N/A 10/28/2013   Procedure: ATRIAL FIBRILLATION ABLATION;  Surgeon: Gardiner Rhyme, MD;  Location: [facility] CATH LAB;  Service: Cardiovascular;  Laterality: N/A;  . CARDIAC CATHETERIZATION  07/2013  . CARDIOVERSION N/A 08/18/2013   Procedure: CARDIOVERSION;  Surgeon: Donato Schultz, MD;  Location: [facility] ENDOSCOPY;  Service: Cardiovascular;  Laterality: N/A;  . CARDIOVERSION N/A 08/29/2013   Procedure: CARDIOVERSION;  Surgeon: Pricilla Riffle, MD;  Location: [facility] OR;  Service: Cardiovascular;  Laterality: N/A;  . ELECTROPHYSIOLOGIC STUDY N/A 11/05/2014   Procedure: Atrial Fibrillation Ablation;  Surgeon: Hillis Range, MD;   Location: [facility] INVASIVE CV LAB;  Service: Cardiovascular;  Laterality: N/A;  . LEFT HEART CATHETERIZATION WITH CORONARY ANGIOGRAM N/A 07/17/2013   Procedure: LEFT HEART CATHETERIZATION WITH CORONARY ANGIOGRAM;  Surgeon: Micheline Chapman, MD;  Location: [facility] CATH LAB;  Service: Cardiovascular;  Laterality: N/A;  . LOOP RECORDER IMPLANT N/A 06/05/2014   Procedure: LOOP RECORDER IMPLANT;  Surgeon: Hillis Range, MD;  Location: [facility] CATH LAB;  Service: Cardiovascular;  Laterality: N/A;  . TEE WITHOUT CARDIOVERSION N/A 07/18/2013   Procedure: TRANSESOPHAGEAL ECHOCARDIOGRAM (TEE);  Surgeon: Lewayne Bunting, MD;  Location: [facility] ENDOSCOPY;  Service: Cardiovascular;  Laterality: N/A;  . TEE WITHOUT CARDIOVERSION N/A 08/18/2013   Procedure: TRANSESOPHAGEAL ECHOCARDIOGRAM (TEE);  Surgeon: Donato Schultz, MD;  Location: [facility] ENDOSCOPY;  Service: Cardiovascular;  Laterality: N/A;  . TEE WITHOUT CARDIOVERSION N/A 10/27/2013   Procedure: TRANSESOPHAGEAL ECHOCARDIOGRAM (TEE);  Surgeon: Pricilla Riffle, MD;  Location: [facility] ENDOSCOPY;  Service: Cardiovascular;  Laterality: N/A;  . TEE WITHOUT CARDIOVERSION N/A 11/04/2014   Procedure: TRANSESOPHAGEAL ECHOCARDIOGRAM (TEE);  Surgeon: Chrystie Nose, MD;  Location: [facility] ENDOSCOPY;  Service: Cardiovascular;  Laterality: N/A;    ROS- all systems are reviewed and negatives except as per HPI above  Current Outpatient Medications  Medication Sig Dispense Refill  . acetaminophen (TYLENOL) 500 MG tablet Take 1,500 mg by mouth every 6 (six) hours as needed for mild pain or headache.     Marland Kitchen  atorvastatin (LIPITOR) 40 MG tablet Take 40 mg by mouth every evening.    Marland Kitchen buPROPion (WELLBUTRIN XL) 150 MG 24 hr tablet Take 150 mg by mouth daily.    Marland Kitchen ELIQUIS 5 MG TABS tablet TAKE 1 TABLET BY MOUTH TWICE A DAY (Patient taking differently: Take 5 mg by mouth 2 (two) times daily. ) 180 tablet 1  . flecainide (TAMBOCOR) 100 MG tablet Take 1 tablet (100 mg total) by mouth 2 (two) times daily. 180 tablet 3  .  furosemide (LASIX) 20 MG tablet TAKE 1 TABLET BY MOUTH EVERY OTHER DAY (Patient taking differently: Take 20 mg by mouth every other day. ) 45 tablet 3  . gabapentin (NEURONTIN) 300 MG capsule Take 300 mg by mouth at bedtime.    Marland Kitchen lisinopril (PRINIVIL,ZESTRIL) 5 MG tablet TAKE 1 TABLET BY MOUTH EVERY DAY (Patient taking differently: Take 5 mg by mouth daily. ) 90 tablet 3  . metoprolol tartrate (LOPRESSOR) 25 MG tablet Take 0.5 tablets (12.5 mg total) by mouth as directed. (Patient taking differently: Take 25 mg by mouth daily as needed (for AFIB >120). ) 90 tablet 3  . potassium chloride (K-DUR,KLOR-CON) 10 MEQ tablet Take 1 tablet (10 mEq total) by mouth every other day. When taking Lasix 45 tablet 1  . sertraline (ZOLOFT) 100 MG tablet Take 200 mg by mouth every morning.     Marland Kitchen tiZANidine (ZANAFLEX) 4 MG tablet Take 4 mg by mouth at bedtime.    . traMADol (ULTRAM) 50 MG tablet Take 50 mg by mouth every 8 (eight) hours as needed for moderate pain.     No current facility-administered medications for this visit.     Physical Exam: Vitals:   02/18/18 1431  BP: 138/84  Pulse: 66  SpO2: 99%  Weight: 186 lb 3.2 oz (84.5 kg)  Height: 5\' 7"  (1.702 m)    GEN- The patient is well appearing, alert and oriented x 3 today.   Head- normocephalic, atraumatic Eyes-  Sclera clear, conjunctiva pink Ears- hearing intact Oropharynx- clear Lungs- Clear to ausculation bilaterally, normal work of breathing Heart- Regular rate and rhythm, no murmurs, rubs or gallops, PMI not laterally displaced GI- soft, NT, ND, + BS Extremities- no clubbing, cyanosis, or edema  Wt Readings from Last 3 Encounters:  02/18/18 186 lb 3.2 oz (84.5 kg)  08/13/17 181 lb (82.1 kg)  02/21/17 175 lb 9.6 oz (79.7 kg)    EKG tracing ordered today is personally reviewed and shows sinus rhythm  Assessment and Plan:  1. Persistent afib/ atrial flutter Doing very well with flecainide ILR is at EOS and cannot be interrogated  today She is considering new LINQ We discussed her Echo results today at length  Return in 6 months to see me If she decides to have new ILR placed, we will schedule at [facility].  Hillis Range MD, [facility] 02/18/2018 2:56 PM

## 2018-03-29 ENCOUNTER — Other Ambulatory Visit: Payer: Self-pay | Admitting: Internal Medicine

## 2018-04-09 DIAGNOSIS — Z01419 Encounter for gynecological examination (general) (routine) without abnormal findings: Secondary | ICD-10-CM | POA: Diagnosis not present

## 2018-04-09 DIAGNOSIS — Z6830 Body mass index (BMI) 30.0-30.9, adult: Secondary | ICD-10-CM | POA: Diagnosis not present

## 2018-04-09 DIAGNOSIS — Z1231 Encounter for screening mammogram for malignant neoplasm of breast: Secondary | ICD-10-CM | POA: Diagnosis not present

## 2018-04-18 DIAGNOSIS — M4316 Spondylolisthesis, lumbar region: Secondary | ICD-10-CM | POA: Diagnosis not present

## 2018-05-04 ENCOUNTER — Other Ambulatory Visit: Payer: Self-pay | Admitting: Internal Medicine

## 2018-05-28 ENCOUNTER — Other Ambulatory Visit: Payer: Self-pay | Admitting: Cardiology

## 2018-06-27 ENCOUNTER — Other Ambulatory Visit: Payer: Self-pay | Admitting: Internal Medicine

## 2018-06-27 NOTE — Telephone Encounter (Signed)
Eliquis 5mg  refill request received.  Age:39yr  Weight: 84.5 kg (02-18-2018) Crea:0.8 (11-30-2017) Per scanned labs [facility].  Last OV: Dr. Johney Frame (02-18-2018), Next OV 08-26-2018 with Dr. Johney Frame

## 2018-07-25 DIAGNOSIS — M4316 Spondylolisthesis, lumbar region: Secondary | ICD-10-CM | POA: Diagnosis not present

## 2018-08-12 ENCOUNTER — Telehealth: Payer: Self-pay | Admitting: Internal Medicine

## 2018-08-12 NOTE — Telephone Encounter (Signed)
  Patient has an appt 08/26/18 and she had discussed having her loop recorder removed in the office since it has expired. She would like to know if that can be done at her 7/13 appointment. If so, she states her insurance may require approval for that.

## 2018-08-26 ENCOUNTER — Ambulatory Visit: Payer: BLUE CROSS/BLUE SHIELD | Admitting: Internal Medicine

## 2018-08-29 ENCOUNTER — Telehealth: Payer: Self-pay | Admitting: Internal Medicine

## 2018-08-29 DIAGNOSIS — G2581 Restless legs syndrome: Secondary | ICD-10-CM | POA: Diagnosis not present

## 2018-08-29 NOTE — Telephone Encounter (Signed)
New message    Pt was scheduled on June 13th with Dr. Rayann Heman for a loop removal/6 month f/u visit. Pt rescheduled appt to August 27th and wants to make sure she will have loop removed that day. Please call.

## 2018-08-29 NOTE — Telephone Encounter (Signed)
Asked scheduling to change appt to a DOD day to ensure Pt can have loop removed.  Scheduling to call Pt.

## 2018-09-30 ENCOUNTER — Telehealth: Payer: Self-pay

## 2018-09-30 NOTE — Telephone Encounter (Signed)
Spoke with pt regarding covid-19 screening prior to appt. Pt stated she has not been in contact with anyone who may have covid-19 and has no symptoms. 

## 2018-10-02 ENCOUNTER — Encounter: Payer: Self-pay | Admitting: Internal Medicine

## 2018-10-02 ENCOUNTER — Ambulatory Visit: Payer: BLUE CROSS/BLUE SHIELD | Admitting: Internal Medicine

## 2018-10-02 ENCOUNTER — Other Ambulatory Visit: Payer: Self-pay

## 2018-10-02 VITALS — BP 126/74 | HR 56 | Ht 67.0 in | Wt 188.0 lb

## 2018-10-02 DIAGNOSIS — I480 Paroxysmal atrial fibrillation: Secondary | ICD-10-CM | POA: Diagnosis not present

## 2018-10-02 HISTORY — PX: OTHER SURGICAL HISTORY: SHX169

## 2018-10-02 LAB — CUP PACEART INCLINIC DEVICE CHECK
Date Time Interrogation Session: 20200819132334
Implantable Pulse Generator Implant Date: 20160422

## 2018-10-02 MED ORDER — POTASSIUM CHLORIDE CRYS ER 10 MEQ PO TBCR: 10.0000 meq | EXTENDED_RELEASE_TABLET | ORAL | 3 refills | Status: DC

## 2018-10-02 NOTE — Patient Instructions (Addendum)
Medication Instructions:  Your physician recommends that you continue on your current medications as directed. Please refer to the Current Medication list given to you today.  Labwork: None ordered.  Testing/Procedures: None ordered.  Follow-Up:  Your physician wants you to follow-up in: 6 months with Dr. Rayann Heman.   You will receive a reminder letter in the mail two months in advance. If you don't receive a letter, please call our office to schedule the follow-up appointment.    Implantable Loop Recorder Placement, Care After Refer to this sheet in the next few weeks. These instructions provide you with information about caring for yourself after your procedure. Your health care provider may also give you more specific instructions. Your treatment has been planned according to current medical practices, but problems sometimes occur. Call your health care provider if you have any problems or questions after your procedure. What can I expect after the procedure? After the procedure, it is common to have:  Soreness or pain near the cut from surgery (incision).  Some swelling or bruising near the incision.  Follow these instructions at home:  Medicines  Take over-the-counter and prescription medicines only as told by your health care provider.  If you were prescribed an antibiotic medicine, take it as told by your health care provider. Do not stop taking the antibiotic even if you start to feel better.  Bathing Do not take baths, swim, or use a hot tub until your health care provider approves. You may shower 24 hours after removal of your monitor.  Incision care  Follow instructions from your health care provider about how to take care of your incision. Make sure you: ? Remove your top dressing after 24 hours (before you shower) ? Leave stitches (sutures), skin glue, or adhesive strips in place. These skin closures may need to stay in place for 2 weeks or longer. If adhesive strip  edges start to loosen and curl up, you may trim the loose edges. Do not remove adhesive strips completely unless your health care provider tells you to do that.  Check your incision area every day for signs of infection. Check for: ? More redness, swelling, or pain. ? Fluid or blood. ? Warmth. ? Pus or a bad smell.  Contact a health care provider if:  You have more redness, swelling, or pain around your incision.  You have more fluid or blood coming from your incision.  Your incision feels warm to the touch.  You have pus or a bad smell coming from your incision.  You have a fever.  You have pain that is not relieved by your pain medicine.  You have triggered your device because of fainting (syncope) or because of a heartbeat that feels like it is racing, slow, fluttering, or skipping (palpitations).

## 2018-10-02 NOTE — Progress Notes (Signed)
PCP: Deland Pretty, MD   Primary EP: Dr Rayann Heman  Kiara [name] is a 58 y.o. female who presents today for routine electrophysiology followup.  Since last being seen in our clinic, the patient reports doing very well.  She continues to have occasional episodes of AF.  Today, she denies symptoms of palpitations, chest pain, shortness of breath,  lower extremity edema, dizziness, presyncope, or syncope.  The patient is otherwise without complaint today.   Past Medical History:  Diagnosis Date  . Asthma   . Atrial fibrillation ([place])    a. admx with AFib with RVR and acute systolic CHF 0/2542; TEE with LAA clot and no DCCV done;    Marland Kitchen Chronic systolic heart failure ([place])   . Coronary artery disease    a.  LHC (07/17/13):  LM 20%, ostial CFX 20-30%  . Depression   . Hyperlipemia   . Hypothyroidism    past hx  . Nephrolithiasis 2013   "passed them"  . NICM (nonischemic cardiomyopathy) ([place])    a. Echo (07/17/13):  EF 25-30%, diff HK, basal and mid anteroseptum/ basal inferoseptum and apical AK, trivial AI, mod MR, mod LAE, mild reduced RVSF, mild RAE  . Obesity   . Osteopenia    Past Surgical History:  Procedure Laterality Date  . ABDOMINAL HYSTERECTOMY  ~ 2001  . ABLATION  10/28/13   PVI by Dr Rayann Heman  . APPENDECTOMY  1978  . ATRIAL FIBRILLATION ABLATION N/A 10/28/2013   Procedure: ATRIAL FIBRILLATION ABLATION;  Surgeon: Coralyn Mark, MD;  Location: [place] CATH LAB;  Service: Cardiovascular;  Laterality: N/A;  . CARDIAC CATHETERIZATION  07/2013  . CARDIOVERSION N/A 08/18/2013   Procedure: CARDIOVERSION;  Surgeon: Candee Furbish, MD;  Location: [facility] ENDOSCOPY;  Service: Cardiovascular;  Laterality: N/A;  . CARDIOVERSION N/A 08/29/2013   Procedure: CARDIOVERSION;  Surgeon: Fay Records, MD;  Location: [place];  Service: Cardiovascular;  Laterality: N/A;  . ELECTROPHYSIOLOGIC STUDY N/A 11/05/2014   Procedure: Atrial Fibrillation Ablation;  Surgeon: Thompson Grayer, MD;  Location: [place] CV LAB;   Service: Cardiovascular;  Laterality: N/A;  . LEFT HEART CATHETERIZATION WITH CORONARY ANGIOGRAM N/A 07/17/2013   Procedure: LEFT HEART CATHETERIZATION WITH CORONARY ANGIOGRAM;  Surgeon: Blane Ohara, MD;  Location: [facility] CATH LAB;  Service: Cardiovascular;  Laterality: N/A;  . LOOP RECORDER IMPLANT N/A 06/05/2014   Procedure: LOOP RECORDER IMPLANT;  Surgeon: Thompson Grayer, MD;  Location: [facility] CATH LAB;  Service: Cardiovascular;  Laterality: N/A;  . TEE WITHOUT CARDIOVERSION N/A 07/18/2013   Procedure: TRANSESOPHAGEAL ECHOCARDIOGRAM (TEE);  Surgeon: Lelon Perla, MD;  Location: [facility] ENDOSCOPY;  Service: Cardiovascular;  Laterality: N/A;  . TEE WITHOUT CARDIOVERSION N/A 08/18/2013   Procedure: TRANSESOPHAGEAL ECHOCARDIOGRAM (TEE);  Surgeon: Candee Furbish, MD;  Location: [facility] ENDOSCOPY;  Service: Cardiovascular;  Laterality: N/A;  . TEE WITHOUT CARDIOVERSION N/A 10/27/2013   Procedure: TRANSESOPHAGEAL ECHOCARDIOGRAM (TEE);  Surgeon: Fay Records, MD;  Location: [place];  Service: Cardiovascular;  Laterality: N/A;  . TEE WITHOUT CARDIOVERSION N/A 11/04/2014   Procedure: TRANSESOPHAGEAL ECHOCARDIOGRAM (TEE);  Surgeon: Pixie Casino, MD;  Location: [facility] ENDOSCOPY;  Service: Cardiovascular;  Laterality: N/A;    ROS- all systems are reviewed and negatives except as per HPI above  Current Outpatient Medications  Medication Sig Dispense Refill  . acetaminophen (TYLENOL) 500 MG tablet Take 1,500 mg by mouth every 6 (six) hours as needed for mild pain or headache.     Marland Kitchen atorvastatin (LIPITOR) 40 MG tablet TAKE 1  TABLET BY MOUTH EVERY DAY AT 6PM 90 tablet 1  . buPROPion (WELLBUTRIN XL) 150 MG 24 hr tablet Take 150 mg by mouth daily.    Marland Kitchen ELIQUIS 5 MG TABS tablet TAKE 1 TABLET BY MOUTH TWICE A DAY 180 tablet 1  . flecainide (TAMBOCOR) 100 MG tablet Take 1 tablet (100 mg total) by mouth 2 (two) times daily. 180 tablet 3  . furosemide (LASIX) 20 MG tablet TAKE 1 TABLET BY MOUTH EVERY OTHER DAY 45 tablet 3  .  gabapentin (NEURONTIN) 300 MG capsule Take 300 mg by mouth at bedtime.    Marland Kitchen lisinopril (PRINIVIL,ZESTRIL) 5 MG tablet TAKE 1 TABLET BY MOUTH EVERY DAY (Patient taking differently: Take 5 mg by mouth daily. ) 90 tablet 3  . metoprolol tartrate (LOPRESSOR) 25 MG tablet TAKE 0.5 TABLETS (12.5 MG TOTAL) BY MOUTH AS DIRECTED. 45 tablet 2  . potassium chloride (K-DUR) 10 MEQ tablet Take 1 tablet (10 mEq total) by mouth every other day. When taking Lasix 45 tablet 3  . sertraline (ZOLOFT) 100 MG tablet Take 200 mg by mouth every morning.     Marland Kitchen tiZANidine (ZANAFLEX) 4 MG tablet Take 4 mg by mouth at bedtime.    . traMADol (ULTRAM) 50 MG tablet Take 50 mg by mouth every 8 (eight) hours as needed for moderate pain.     No current facility-administered medications for this visit.     Physical Exam: Vitals:   10/02/18 1237  BP: 126/74  Pulse: (!) 56  SpO2: 98%  Weight: 188 lb (85.3 kg)  Height: 5\' 7"  (1.702 m)    GEN- The patient is well appearing, alert and oriented x 3 today.   Head- normocephalic, atraumatic Eyes-  Sclera clear, conjunctiva pink Ears- hearing intact Oropharynx- clear Lungs- Clear to ausculation bilaterally, normal work of breathing Heart- Regular rate and rhythm, no murmurs, rubs or gallops, PMI not laterally displaced GI- soft, NT, ND, + BS Extremities- no clubbing, cyanosis, or edema  Echo 12/19 is reviewed  Wt Readings from Last 3 Encounters:  10/02/18 188 lb (85.3 kg)  02/18/18 186 lb 3.2 oz (84.5 kg)  08/13/17 181 lb (82.1 kg)    Assessment and Plan:  1. Persistent atrial fibrillation/ atrial flutter Stable No change required today Her ILR is at EOS.  She wishes to have this removed.  She does not wish for another device to be implanted currently. Risks and benefits to ILR were discussed with the patient who wishes to proceed.  Hillis Range MD, [facility] 10/02/2018 1:58 PM      DESCRIPTION OF PROCEDURE:  Informed written consent was obtained.  The patient  required no sedation for the procedure today.   The patients left chest was therefore prepped and draped in the usual sterile fashion.  The skin overlying the ILR monitor was infiltrated with lidocaine for local analgesia.  A 0.5-cm incision was made over the site.  The previously implanted ILR was exposed and removed using a combination of sharp and blunt dissection.  Steri- Strips and a sterile dressing were then applied. EBL<51ml.  There were no early apparent complications.     CONCLUSIONS:   1. Successful explantation of a [employer] Reveal LINQ implantable loop recorder   2. No early apparent complications.        Hillis Range MD, [facility] 10/02/2018 2:00 PM

## 2018-10-08 DIAGNOSIS — Z23 Encounter for immunization: Secondary | ICD-10-CM | POA: Diagnosis not present

## 2018-10-08 DIAGNOSIS — Z13220 Encounter for screening for lipoid disorders: Secondary | ICD-10-CM | POA: Diagnosis not present

## 2018-10-08 DIAGNOSIS — Z131 Encounter for screening for diabetes mellitus: Secondary | ICD-10-CM | POA: Diagnosis not present

## 2018-10-08 DIAGNOSIS — Z713 Dietary counseling and surveillance: Secondary | ICD-10-CM | POA: Diagnosis not present

## 2018-10-08 DIAGNOSIS — R030 Elevated blood-pressure reading, without diagnosis of hypertension: Secondary | ICD-10-CM | POA: Diagnosis not present

## 2018-10-08 DIAGNOSIS — Z136 Encounter for screening for cardiovascular disorders: Secondary | ICD-10-CM | POA: Diagnosis not present

## 2018-10-10 ENCOUNTER — Encounter: Payer: BLUE CROSS/BLUE SHIELD | Admitting: Internal Medicine

## 2018-10-22 DIAGNOSIS — L814 Other melanin hyperpigmentation: Secondary | ICD-10-CM | POA: Diagnosis not present

## 2018-10-22 DIAGNOSIS — L918 Other hypertrophic disorders of the skin: Secondary | ICD-10-CM | POA: Diagnosis not present

## 2018-10-22 DIAGNOSIS — L821 Other seborrheic keratosis: Secondary | ICD-10-CM | POA: Diagnosis not present

## 2018-10-23 ENCOUNTER — Other Ambulatory Visit: Payer: Self-pay | Admitting: Internal Medicine

## 2018-11-27 DIAGNOSIS — Z7189 Other specified counseling: Secondary | ICD-10-CM | POA: Diagnosis not present

## 2018-11-27 DIAGNOSIS — B029 Zoster without complications: Secondary | ICD-10-CM | POA: Diagnosis not present

## 2018-12-13 DIAGNOSIS — Z0000 Encounter for general adult medical examination without abnormal findings: Secondary | ICD-10-CM | POA: Diagnosis not present

## 2018-12-13 DIAGNOSIS — E559 Vitamin D deficiency, unspecified: Secondary | ICD-10-CM | POA: Diagnosis not present

## 2018-12-13 DIAGNOSIS — E7800 Pure hypercholesterolemia, unspecified: Secondary | ICD-10-CM | POA: Diagnosis not present

## 2018-12-16 DIAGNOSIS — S8002XA Contusion of left knee, initial encounter: Secondary | ICD-10-CM | POA: Diagnosis not present

## 2018-12-16 DIAGNOSIS — M25562 Pain in left knee: Secondary | ICD-10-CM | POA: Diagnosis not present

## 2018-12-18 DIAGNOSIS — F418 Other specified anxiety disorders: Secondary | ICD-10-CM | POA: Diagnosis not present

## 2018-12-18 DIAGNOSIS — Z23 Encounter for immunization: Secondary | ICD-10-CM | POA: Diagnosis not present

## 2018-12-18 DIAGNOSIS — I429 Cardiomyopathy, unspecified: Secondary | ICD-10-CM | POA: Diagnosis not present

## 2018-12-18 DIAGNOSIS — E7800 Pure hypercholesterolemia, unspecified: Secondary | ICD-10-CM | POA: Diagnosis not present

## 2018-12-18 DIAGNOSIS — Z0000 Encounter for general adult medical examination without abnormal findings: Secondary | ICD-10-CM | POA: Diagnosis not present

## 2018-12-28 ENCOUNTER — Other Ambulatory Visit: Payer: Self-pay | Admitting: Internal Medicine

## 2018-12-30 ENCOUNTER — Other Ambulatory Visit: Payer: Self-pay | Admitting: Internal Medicine

## 2019-01-16 DIAGNOSIS — M545 Low back pain: Secondary | ICD-10-CM | POA: Diagnosis not present

## 2019-01-16 DIAGNOSIS — Z981 Arthrodesis status: Secondary | ICD-10-CM | POA: Diagnosis not present

## 2019-01-16 DIAGNOSIS — M4696 Unspecified inflammatory spondylopathy, lumbar region: Secondary | ICD-10-CM | POA: Diagnosis not present

## 2019-01-16 DIAGNOSIS — M4326 Fusion of spine, lumbar region: Secondary | ICD-10-CM | POA: Diagnosis not present

## 2019-01-16 DIAGNOSIS — M4186 Other forms of scoliosis, lumbar region: Secondary | ICD-10-CM | POA: Diagnosis not present

## 2019-01-16 DIAGNOSIS — M5136 Other intervertebral disc degeneration, lumbar region: Secondary | ICD-10-CM | POA: Diagnosis not present

## 2019-01-16 HISTORY — DX: Arthrodesis status: Z98.1

## 2019-01-21 DIAGNOSIS — M545 Low back pain: Secondary | ICD-10-CM | POA: Diagnosis not present

## 2019-01-30 DIAGNOSIS — M545 Low back pain: Secondary | ICD-10-CM | POA: Diagnosis not present

## 2019-01-30 DIAGNOSIS — S3210XA Unspecified fracture of sacrum, initial encounter for closed fracture: Secondary | ICD-10-CM | POA: Insufficient documentation

## 2019-02-03 ENCOUNTER — Telehealth (HOSPITAL_COMMUNITY): Payer: Self-pay | Admitting: Radiology

## 2019-02-03 ENCOUNTER — Other Ambulatory Visit (HOSPITAL_COMMUNITY): Payer: Self-pay | Admitting: Interventional Radiology

## 2019-02-03 DIAGNOSIS — M8448XA Pathological fracture, other site, initial encounter for fracture: Secondary | ICD-10-CM

## 2019-02-03 NOTE — Telephone Encounter (Signed)
Called pt, she will call me back to schedule on 02/18/19 with Deveshwar for sacroplasty. Needs to check something first. JM

## 2019-02-04 ENCOUNTER — Other Ambulatory Visit: Payer: Self-pay | Admitting: Radiology

## 2019-02-04 MED ORDER — SODIUM CHLORIDE 0.9 % IV SOLN: INTRAVENOUS | Status: AC

## 2019-02-06 ENCOUNTER — Other Ambulatory Visit: Payer: Self-pay

## 2019-02-06 ENCOUNTER — Ambulatory Visit (HOSPITAL_COMMUNITY)
Admission: RE | Admit: 2019-02-06 | Discharge: 2019-02-06 | Disposition: A | Discharge status: Home / Self Care | Payer: BC Managed Care – PPO | Source: Ambulatory Visit | Attending: Interventional Radiology | Admitting: Interventional Radiology

## 2019-02-06 ENCOUNTER — Encounter (HOSPITAL_COMMUNITY): Payer: Self-pay

## 2019-02-06 DIAGNOSIS — J45909 Unspecified asthma, uncomplicated: Secondary | ICD-10-CM | POA: Insufficient documentation

## 2019-02-06 DIAGNOSIS — Z7901 Long term (current) use of anticoagulants: Secondary | ICD-10-CM | POA: Insufficient documentation

## 2019-02-06 DIAGNOSIS — I4891 Unspecified atrial fibrillation: Secondary | ICD-10-CM | POA: Insufficient documentation

## 2019-02-06 DIAGNOSIS — I5022 Chronic systolic (congestive) heart failure: Secondary | ICD-10-CM | POA: Diagnosis not present

## 2019-02-06 DIAGNOSIS — Z79899 Other long term (current) drug therapy: Secondary | ICD-10-CM | POA: Diagnosis not present

## 2019-02-06 DIAGNOSIS — I428 Other cardiomyopathies: Secondary | ICD-10-CM | POA: Insufficient documentation

## 2019-02-06 DIAGNOSIS — I2510 Atherosclerotic heart disease of native coronary artery without angina pectoris: Secondary | ICD-10-CM | POA: Insufficient documentation

## 2019-02-06 DIAGNOSIS — E785 Hyperlipidemia, unspecified: Secondary | ICD-10-CM | POA: Insufficient documentation

## 2019-02-06 DIAGNOSIS — M8448XA Pathological fracture, other site, initial encounter for fracture: Secondary | ICD-10-CM

## 2019-02-06 DIAGNOSIS — E039 Hypothyroidism, unspecified: Secondary | ICD-10-CM | POA: Diagnosis not present

## 2019-02-06 DIAGNOSIS — F329 Major depressive disorder, single episode, unspecified: Secondary | ICD-10-CM | POA: Diagnosis not present

## 2019-02-06 DIAGNOSIS — M545 Low back pain: Secondary | ICD-10-CM | POA: Diagnosis not present

## 2019-02-06 HISTORY — PX: IR SACROPLASTY BILATERAL: IMG5561

## 2019-02-06 LAB — URINALYSIS, COMPLETE (UACMP) WITH MICROSCOPIC
Bilirubin Urine: NEGATIVE
Glucose, UA: NEGATIVE mg/dL
Hgb urine dipstick: NEGATIVE
Ketones, ur: NEGATIVE mg/dL
Leukocytes,Ua: NEGATIVE
Nitrite: NEGATIVE
Protein, ur: NEGATIVE mg/dL
Specific Gravity, Urine: 1.017 (ref 1.005–1.030)
pH: 5 (ref 5.0–8.0)

## 2019-02-06 LAB — BASIC METABOLIC PANEL
Anion gap: 11 (ref 5–15)
BUN: 14 mg/dL (ref 6–20)
CO2: 24 mmol/L (ref 22–32)
Calcium: 9.2 mg/dL (ref 8.9–10.3)
Chloride: 107 mmol/L (ref 98–111)
Creatinine, Ser: 0.79 mg/dL (ref 0.44–1.00)
GFR calc Af Amer: >60 mL/min (ref >60)
GFR calc non Af Amer: >60 mL/min (ref >60)
Glucose, Bld: 99 mg/dL (ref 70–99)
Potassium: 3.7 mmol/L (ref 3.5–5.1)
Sodium: 142 mmol/L (ref 135–145)

## 2019-02-06 LAB — CBC
HCT: 39.9 % (ref 36.0–46.0)
Hemoglobin: 12.9 g/dL (ref 12.0–15.0)
MCH: 31.2 pg (ref 26.0–34.0)
MCHC: 32.3 g/dL (ref 30.0–36.0)
MCV: 96.6 fL (ref 80.0–100.0)
Platelets: 174 10*3/uL (ref 150–400)
RBC: 4.13 MIL/uL (ref 3.87–5.11)
RDW: 13.8 % (ref 11.5–15.5)
WBC: 6.2 10*3/uL (ref 4.0–10.5)
nRBC: 0 % (ref 0.0–0.2)

## 2019-02-06 LAB — PROTIME-INR
INR: 0.9 (ref 0.8–1.2)
Prothrombin Time: 12.2 seconds (ref 11.4–15.2)

## 2019-02-06 MED ORDER — HYDROMORPHONE HCL 1 MG/ML IJ SOLN
INTRAMUSCULAR | Status: AC | PRN
  Administered 2019-02-06: 1 mg via INTRAVENOUS

## 2019-02-06 MED ORDER — GELATIN ABSORBABLE 12-7 MM EX MISC
CUTANEOUS | Status: AC
  Filled 2019-02-06: qty 1

## 2019-02-06 MED ORDER — SODIUM CHLORIDE 0.9 % IV SOLN: INTRAVENOUS | Status: DC

## 2019-02-06 MED ORDER — CEFAZOLIN SODIUM-DEXTROSE 2-4 GM/100ML-% IV SOLN
INTRAVENOUS | Status: AC
  Administered 2019-02-06: 09:00:00 2 g via INTRAVENOUS
  Filled 2019-02-06: qty 100

## 2019-02-06 MED ORDER — MIDAZOLAM HCL 2 MG/2ML IJ SOLN
INTRAMUSCULAR | Status: AC
  Filled 2019-02-06: qty 2

## 2019-02-06 MED ORDER — BUPIVACAINE HCL (PF) 0.5 % IJ SOLN
INTRAMUSCULAR | Status: AC
  Administered 2019-02-06: 30 mL
  Filled 2019-02-06: qty 30

## 2019-02-06 MED ORDER — CEFAZOLIN SODIUM-DEXTROSE 2-4 GM/100ML-% IV SOLN: 2.0000 g | INTRAVENOUS | Status: AC

## 2019-02-06 MED ORDER — FENTANYL CITRATE (PF) 100 MCG/2ML IJ SOLN
INTRAMUSCULAR | Status: AC
  Filled 2019-02-06: qty 2

## 2019-02-06 MED ORDER — HYDROMORPHONE HCL 1 MG/ML IJ SOLN
INTRAMUSCULAR | Status: AC
  Filled 2019-02-06: qty 1

## 2019-02-06 MED ORDER — FENTANYL CITRATE (PF) 100 MCG/2ML IJ SOLN
INTRAMUSCULAR | Status: AC | PRN
  Administered 2019-02-06 (×2): 25 ug via INTRAVENOUS

## 2019-02-06 MED ORDER — IOHEXOL 300 MG/ML  SOLN
50.0000 mL | Freq: Once | INTRAMUSCULAR | Status: AC | PRN
  Administered 2019-02-06: 10:00:00 4 mL via INTRAVENOUS

## 2019-02-06 MED ORDER — TOBRAMYCIN SULFATE 1.2 G IJ SOLR
INTRAMUSCULAR | Status: AC
  Filled 2019-02-06: qty 1.2

## 2019-02-06 MED ORDER — SODIUM CHLORIDE 0.9 % IV SOLN: INTRAVENOUS | Status: AC

## 2019-02-06 MED ORDER — MIDAZOLAM HCL 2 MG/2ML IJ SOLN
INTRAMUSCULAR | Status: AC | PRN
  Administered 2019-02-06 (×2): 1 mg via INTRAVENOUS

## 2019-02-06 NOTE — Progress Notes (Addendum)
Discharge instructions reviewed with pt and her husband (via telephone) both voice understanding.  Ambulated to bathroom to void tol well

## 2019-02-06 NOTE — H&P (Addendum)
Chief Complaint: Patient was seen in consultation today for Sacroplasty at the request of Dr Dub Amis   Supervising Physician: Julieanne Cotton  Patient Status: [facility] - Out-pt  History of Present Illness: Kiara [name] is a 58 y.o. female   Pt fell at home early Nov. Pain in low back and left buttock Pain worsening; "9" on scale of 10 Gabapentin with some relief  Pt sees Dr Dub Amis for back pain and previous surgery Imaging done at his office shows sacral insufficiency fracture He has requested Sacroplasty  Imaging was reviewed with Dr Corliss Skains He approves procedure LD [place] Mon 12/21   Past Medical History:  Diagnosis Date  . Asthma   . Atrial fibrillation ([facility])    a. admx with AFib with RVR and acute systolic CHF 07/2013; TEE with LAA clot and no DCCV done;    Marland Kitchen Chronic systolic heart failure ([facility])   . Coronary artery disease    a.  LHC (07/17/13):  LM 20%, ostial CFX 20-30%  . Depression   . Hyperlipemia   . Hypothyroidism    past hx  . Nephrolithiasis 2013   "passed them"  . NICM (nonischemic cardiomyopathy) ([facility])    a. Echo (07/17/13):  EF 25-30%, diff HK, basal and mid anteroseptum/ basal inferoseptum and apical AK, trivial AI, mod MR, mod LAE, mild reduced RVSF, mild RAE  . Obesity   . Osteopenia     Past Surgical History:  Procedure Laterality Date  . ABDOMINAL HYSTERECTOMY  ~ 2001  . ABLATION  10/28/13   PVI by Dr Johney Frame  . APPENDECTOMY  1978  . ATRIAL FIBRILLATION ABLATION N/A 10/28/2013   Procedure: ATRIAL FIBRILLATION ABLATION;  Surgeon: Gardiner Rhyme, MD;  Location: [facility] CATH LAB;  Service: Cardiovascular;  Laterality: N/A;  . CARDIAC CATHETERIZATION  07/2013  . CARDIOVERSION N/A 08/18/2013   Procedure: CARDIOVERSION;  Surgeon: Donato Schultz, MD;  Location: [facility] ENDOSCOPY;  Service: Cardiovascular;  Laterality: N/A;  . CARDIOVERSION N/A 08/29/2013   Procedure: CARDIOVERSION;  Surgeon: Pricilla Riffle, MD;  Location[facility] OR;  Service: Cardiovascular;   Laterality: N/A;  . ELECTROPHYSIOLOGIC STUDY N/A 11/05/2014   Procedure: Atrial Fibrillation Ablation;  Surgeon: Hillis Range, MD;  Location: [facility] INVASIVE CV LAB;  Service: Cardiovascular;  Laterality: N/A;  . implantable loop recorder removal  10/02/2018   MDT Reveal LINQ removed in office by Dr Johney Frame for EOL battery  . LEFT HEART CATHETERIZATION WITH CORONARY ANGIOGRAM N/A 07/17/2013   Procedure: LEFT HEART CATHETERIZATION WITH CORONARY ANGIOGRAM;  Surgeon: Micheline Chapman, MD;  Location: [facility] CATH LAB;  Service: Cardiovascular;  Laterality: N/A;  . LOOP RECORDER IMPLANT N/A 06/05/2014   Procedure: LOOP RECORDER IMPLANT;  Surgeon: Hillis Range, MD;  Location: [facility] CATH LAB;  Service: Cardiovascular;  Laterality: N/A;  . TEE WITHOUT CARDIOVERSION N/A 07/18/2013   Procedure: TRANSESOPHAGEAL ECHOCARDIOGRAM (TEE);  Surgeon: Lewayne Bunting, MD;  Location: [facility] ENDOSCOPY;  Service: Cardiovascular;  Laterality: N/A;  . TEE WITHOUT CARDIOVERSION N/A 08/18/2013   Procedure: TRANSESOPHAGEAL ECHOCARDIOGRAM (TEE);  Surgeon: Donato Schultz, MD;  Location: [facility] ENDOSCOPY;  Service: Cardiovascular;  Laterality: N/A;  . TEE WITHOUT CARDIOVERSION N/A 10/27/2013   Procedure: TRANSESOPHAGEAL ECHOCARDIOGRAM (TEE);  Surgeon: Pricilla Riffle, MD;  Location: [facility] ENDOSCOPY;  Service: Cardiovascular;  Laterality: N/A;  . TEE WITHOUT CARDIOVERSION N/A 11/04/2014   Procedure: TRANSESOPHAGEAL ECHOCARDIOGRAM (TEE);  Surgeon: Chrystie Nose, MD;  Location: [facility] ENDOSCOPY;  Service: Cardiovascular;  Laterality: N/A;    Allergies: Azithromycin  Medications: Prior to Admission medications   Medication Sig Start Date End Date Taking? Authorizing Provider  atorvastatin (LIPITOR) 40 MG tablet TAKE 1 TABLET BY MOUTH EVERY DAY AT 6PM 12/30/18  Yes Allred, Fayrene FearingJames, MD  buPROPion (WELLBUTRIN XL) 150 MG 24 hr tablet Take 150 mg by mouth daily. 09/09/15  Yes [provider]  ELIQUIS 5 MG TABS tablet TAKE 1 TABLET BY MOUTH TWICE A DAY 06/27/18   Yes Allred, Fayrene FearingJames, MD  flecainide (TAMBOCOR) 100 MG tablet TAKE 1 TABLET BY MOUTH TWICE A DAY 12/31/18  Yes Allred, Fayrene FearingJames, MD  furosemide (LASIX) 20 MG tablet TAKE 1 TABLET BY MOUTH EVERY OTHER DAY 05/29/18  Yes Allred, Fayrene FearingJames, MD  gabapentin (NEURONTIN) 300 MG capsule Take 300 mg by mouth at bedtime.   Yes [provider]  lisinopril (ZESTRIL) 5 MG tablet TAKE 1 TABLET BY MOUTH EVERY DAY 10/23/18  Yes Allred, Fayrene FearingJames, MD  metoprolol tartrate (LOPRESSOR) 25 MG tablet TAKE 0.5 TABLETS (12.5 MG TOTAL) BY MOUTH AS DIRECTED. 05/06/18  Yes Allred, Fayrene FearingJames, MD  potassium chloride (K-DUR) 10 MEQ tablet Take 1 tablet (10 mEq total) by mouth every other day. When taking Lasix 10/02/18  Yes Allred, Fayrene FearingJames, MD  sertraline (ZOLOFT) 100 MG tablet Take 200 mg by mouth every morning.  07/04/13  Yes [provider]  tiZANidine (ZANAFLEX) 4 MG tablet Take 4 mg by mouth at bedtime.   Yes [provider]  traMADol (ULTRAM) 50 MG tablet Take 50 mg by mouth every 8 (eight) hours as needed for moderate pain.   Yes [provider]  acetaminophen (TYLENOL) 500 MG tablet Take 1,500 mg by mouth every 6 (six) hours as needed for mild pain or headache.     [provider]     Family History  Problem Relation Age of Onset  . Hypertension Mother   . Lymphoma Mother   . Hypertension Father     Social History   Socioeconomic History  . Marital status: Married    Spouse name: Not on file  . Number of children: Not on file  . Years of education: Not on file  . Highest education level: Not on file  Occupational History  . Not on file  Tobacco Use  . Smoking status: Never Smoker  . Smokeless tobacco: Never Used  Substance and Sexual Activity  . Alcohol use: No  . Drug use: No  . Sexual activity: Yes  Other Topics Concern  . Not on file  Social History Narrative  . Not on file   Social Determinants of Health   Financial Resource Strain:   . Difficulty of Paying Living  Expenses: Not on file  Food Insecurity:   . Worried About Programme researcher, broadcasting/film/videounning Out of Food in the Last Year: Not on file  . Ran Out of Food in the Last Year: Not on file  Transportation Needs:   . Lack of Transportation (Medical): Not on file  . Lack of Transportation (Non-Medical): Not on file  Physical Activity:   . Days of Exercise per Week: Not on file  . Minutes of Exercise per Session: Not on file  Stress:   . Feeling of Stress : Not on file  Social Connections:   . Frequency of Communication with Friends and Family: Not on file  . Frequency of Social Gatherings with Friends and Family: Not on file  . Attends Religious Services: Not on file  . Active Member of Clubs or Organizations: Not on file  . Attends Club  or Organization Meetings: Not on file  . Marital Status: Not on file    Review of Systems: A 12 point ROS discussed and pertinent positives are indicated in the HPI above.  All other systems are negative.  Review of Systems  Constitutional: Positive for activity change. Negative for fatigue and fever.  Respiratory: Negative for cough and shortness of breath.   Cardiovascular: Negative for chest pain.  Gastrointestinal: Negative for abdominal pain.  Musculoskeletal: Positive for back pain.  Neurological: Negative for weakness.  Psychiatric/Behavioral: Negative for behavioral problems and confusion.    Vital Signs: BP 131/75   Pulse (!) 48   Temp 98 F (36.7 C) (Oral)   Resp 20   Ht 5\' 7"  (1.702 m)   Wt 190 lb (86.2 kg)   SpO2 100%   BMI 29.76 kg/m   Physical Exam Vitals reviewed.  Eyes:     Extraocular Movements: Extraocular movements intact.  Cardiovascular:     Rate and Rhythm: Normal rate and regular rhythm.     Heart sounds: Normal heart sounds.  Pulmonary:     Effort: Pulmonary effort is normal.     Breath sounds: Normal breath sounds.  Abdominal:     Tenderness: There is no abdominal tenderness.  Musculoskeletal:        General: Normal range of motion.       Comments: Low back and left buttock pain  Skin:    General: Skin is warm and dry.  Neurological:     Mental Status: She is alert and oriented to person, place, and time.  Psychiatric:        Mood and Affect: Mood normal.        Behavior: Behavior normal.        Thought Content: Thought content normal.        Judgment: Judgment normal.     Imaging: No results found.  Labs:  CBC: Recent Labs    02/06/19 0700  WBC 6.2  HGB 12.9  HCT 39.9  PLT 174    COAGS: Recent Labs    02/06/19 0700  INR 0.9    BMP: Recent Labs    02/06/19 0700  NA 142  K 3.7  CL 107  CO2 24  GLUCOSE 99  BUN 14  CALCIUM 9.2  CREATININE 0.79  GFRNONAA >60  GFRAA >60    LIVER FUNCTION TESTS: No results for input(s): BILITOT, AST, ALT, ALKPHOS, PROT, ALBUMIN in the last 8760 hours.  TUMOR MARKERS: No results for input(s): AFPTM, CEA, CA199, CHROMGRNA in the last 8760 hours.  Assessment and Plan:  Worsening low back and left buttock pain Imaging revealing sacral insufficiency fracture Scheduled now for Sacroplasty Risks and benefits of Sacroplasty were discussed with the patient including, but not limited to education regarding the natural healing process of compression fractures without intervention, bleeding, infection, cement migration which may cause spinal cord damage, paralysis, pulmonary embolism or even death.  This interventional procedure involves the use of X-rays and because of the nature of the planned procedure, it is possible that we will have prolonged use of X-ray fluoroscopy.  Potential radiation risks to you include (but are not limited to) the following: - A slightly elevated risk for cancer  several years later in life. This risk is typically less than 0.5% percent. This risk is low in comparison to the normal incidence of human cancer, which is 33% for women and 50% for men according to the [place]. - Radiation induced injury can  include skin  redness, resembling a rash, tissue breakdown / ulcers and hair loss (which can be temporary or permanent).   The likelihood of either of these occurring depends on the difficulty of the procedure and whether you are sensitive to radiation due to previous procedures, disease, or genetic conditions.   IF your procedure requires a prolonged use of radiation, you will be notified and given written instructions for further action.  It is your responsibility to monitor the irradiated area for the 2 weeks following the procedure and to notify your physician if you are concerned that you have suffered a radiation induced injury.    All of the patient's questions were answered, patient is agreeable to proceed.  Consent signed and in chart.  Thank you for this interesting consult.  I greatly enjoyed meeting Peyten O Kinder and look forward to participating in their care.  A copy of this report was sent to the requesting provider on this date.  Electronically Signed: Robet Leu, PA-C 02/06/2019, 8:17 AM   I spent a total of  30 Minutes   in face to face in clinical consultation, greater than 50% of which was counseling/coordinating care for sacroplasty

## 2019-02-06 NOTE — Procedures (Signed)
S/P sacr plasty  Bilateral approach. S.Bailie Christenbury MD

## 2019-02-06 NOTE — Discharge Instructions (Signed)
Moderate Conscious Sedation, Adult, Care After These instructions provide you with information about caring for yourself after your procedure. Your health care provider may also give you more specific instructions. Your treatment has been planned according to current medical practices, but problems sometimes occur. Call your health care provider if you have any problems or questions after your procedure. What can I expect after the procedure? After your procedure, it is common:  To feel sleepy for several hours.  To feel clumsy and have poor balance for several hours.  To have poor judgment for several hours.  To vomit if you eat too soon. Follow these instructions at home: For at least 24 hours after the procedure:   Do not: ? Participate in activities where you could fall or become injured. ? Drive. ? Use heavy machinery. ? Drink alcohol. ? Take sleeping pills or medicines that cause drowsiness. ? Make important decisions or sign legal documents. ? Take care of children on your own.  Rest. Eating and drinking  Follow the diet recommended by your health care provider.  If you vomit: ? Drink water, juice, or soup when you can drink without vomiting. ? Make sure you have little or no nausea before eating solid foods. General instructions  Have a responsible adult stay with you until you are awake and alert.  Take over-the-counter and prescription medicines only as told by your health care provider.  If you smoke, do not smoke without supervision.  Keep all follow-up visits as told by your health care provider. This is important. Contact a health care provider if:  You keep feeling nauseous or you keep vomiting.  You feel light-headed.  You develop a rash.  You have a fever. Get help right away if:  You have trouble breathing. This information is not intended to replace advice given to you by your health care provider. Make sure you discuss any questions you have  with your health care provider. Document Released: 11/20/2012 Document Revised: 01/12/2017 Document Reviewed: 05/22/2015 Elsevier Patient Education  2020 Elsevier Inc.   Sacroplasty  Care After This sheet gives you information about how to care for yourself after your procedure. Your health care provider may also give you more specific instructions. If you have problems or questions, contact your health care provider. What can I expect after the procedure? After your procedure, it is common to have back pain. Follow these instructions at home: Medicines  Take over-the-counter and prescription medicines only as told by your health care provider.  Ask your health care provider if the medicine prescribed to you: ? Requires you to avoid driving or using heavy machinery. ? Can cause constipation. You may need to take steps to prevent or treat constipation, such as:  Drink enough fluid to keep your urine pale yellow.  Take over-the-counter or prescription medicines.  Eat foods that are high in fiber, such as beans, whole grains, and fresh fruits and vegetables.  Limit foods that are high in fat and processed sugars, such as fried or sweet foods. Puncture site care   Follow instructions from your health care provider about how to take care of your puncture site. Make sure you: ? Wash your hands with soap and water before and after you change your bandage (dressing). If soap and water are not available, use hand sanitizer. ? Change your dressing as told by your health care provider. ? Leave skin glue or adhesive strips in place. These skin closures may need to be in place for  2 weeks or longer. If adhesive strip edges start to loosen and curl up, you may trim the loose edges. Do not remove adhesive strips completely unless your health care provider tells you to do that.  Check your puncture site every day for signs of infection. Watch for: ? Redness, swelling, or pain. ? Fluid or  blood. ? Warmth. ? Pus or a bad smell.  Keep your dressing dry until your health care provider says that it can be removed. Managing pain, stiffness, and swelling   If directed, put ice on the painful area. ? Put ice in a plastic bag. ? Place a towel between your skin and the bag. ? Leave the ice on for 20 minutes, 2-3 times a day. Activity  Rest your back and avoid intense physical activity for as long as told by your health care provider.  Avoid bending, lifting, or twisting your back for as long as told by your health care provider.  Return to your normal activities as told by your health care provider. Ask your health care provider what activities are safe for you.  Do not lift anything that is heavier than 5 lb (2.2 kg). You may need to avoid heavy lifting for several weeks. General instructions  Do not use any products that contain nicotine or tobacco, such as cigarettes, e-cigarettes, and chewing tobacco. These can delay bone healing. If you need help quitting, ask your health care provider.  Do not drive for 24 hours if you were given a sedative during your procedure.  Keep all follow-up visits as told by your health care provider. This is important. Contact a health care provider if:  You have a fever or chills.  You have redness, swelling, or pain at the site of your puncture.  You have fluid, blood, or pus coming from the puncture site.  You have pain that gets worse or does not get better with medicine.  You develop numbness or weakness in any part of your body. Get help right away if:  You have chest pain.  You have difficulty breathing.  You have weakness, numbness, or tingling in your legs.  You cannot control your bladder or bowel movements.  You suddenly become weak or numb on one side of your body.  You become very confused.  You have trouble speaking or understanding, or both. Summary  Follow instructions from your health care provider about  how to take care of your puncture site.  Take over-the-counter and prescription medicines only as told by your health care provider.  Rest your back and avoid intense physical activity for as long as told by your health care provider.  Contact a health care provider if you have pain that gets worse or does not get better with medicine.  Keep all follow-up visits as told by your health care provider. This is important. This information is not intended to replace advice given to you by your health care provider. Make sure you discuss any questions you have with your health care provider. Document Released: 10/21/2014 Document Revised: 01/07/2018 Document Reviewed: 01/07/2018 Elsevier Patient Education  [place]. 1.No stooping,bending orlifting more than 10 lbs for 2 weeks 2.Use walker to ambulate for 2 weeks. 3.No driving for 2 weeks 4.RTC I PRN n 2 weeks

## 2019-03-23 ENCOUNTER — Other Ambulatory Visit: Payer: Self-pay | Admitting: Internal Medicine

## 2019-03-24 NOTE — Telephone Encounter (Signed)
Eliquis 5mg  refill request received, pt is 59 yrs old, weight-86.2kg, Crea-0.79 on 02/06/2019, Diagnosis-Afib, and last seen by Dr. 02/08/2019 on 10/02/2018. Dose is appropriate based on dosing criteria. Will send in refill to requested pharmacy.

## 2019-04-03 ENCOUNTER — Telehealth: Payer: BC Managed Care – PPO | Admitting: Internal Medicine

## 2019-04-07 ENCOUNTER — Ambulatory Visit: Payer: BC Managed Care – PPO | Admitting: Internal Medicine

## 2019-05-06 DIAGNOSIS — Z23 Encounter for immunization: Secondary | ICD-10-CM | POA: Diagnosis not present

## 2019-05-28 ENCOUNTER — Telehealth (INDEPENDENT_AMBULATORY_CARE_PROVIDER_SITE_OTHER): Payer: BC Managed Care – PPO | Admitting: Internal Medicine

## 2019-05-28 ENCOUNTER — Other Ambulatory Visit: Payer: Self-pay

## 2019-05-28 ENCOUNTER — Encounter: Payer: Self-pay | Admitting: Internal Medicine

## 2019-05-28 VITALS — HR 62 | Ht 67.0 in | Wt 190.0 lb

## 2019-05-28 DIAGNOSIS — D6869 Other thrombophilia: Secondary | ICD-10-CM

## 2019-05-28 DIAGNOSIS — E782 Mixed hyperlipidemia: Secondary | ICD-10-CM | POA: Diagnosis not present

## 2019-05-28 DIAGNOSIS — I480 Paroxysmal atrial fibrillation: Secondary | ICD-10-CM

## 2019-05-28 NOTE — Progress Notes (Signed)
Electrophysiology TeleHealth Note   Due to national recommendations of social distancing due to COVID 19, an audio/video telehealth visit is felt to be most appropriate for this patient at this time.  See MyChart message from today for the patient's consent to telehealth for [facility].  Date:  05/28/2019   ID:  Kiara, [name] [birth year], MRN [id number]  Location: patient's home  Provider location:  [facility]  Evaluation Performed: Follow-up visit  PCP:  [name], MD   Electrophysiologist:  Dr Johney Frame  Chief Complaint:  palpitations  History of Present Illness:    Kiara [name] is a 59 y.o. female who presents via telehealth conferencing today.  Since last being seen in our clinic, the patient reports doing very well.  Her afib is well controlled.  She has some palpitations during anxiety or stress.  Her mother has advanced dementia.  This is stressful for her.  Her daughter [name]) is graduating from [employer] with her masters in SW and will be starting a job in [employer].  Today, she denies symptoms of chest pain, shortness of breath,  lower extremity edema, dizziness, presyncope, or syncope.  The patient is otherwise without complaint today.  The patient denies symptoms of fevers, chills, cough, or new SOB worrisome for COVID 19.  Past Medical History:  Diagnosis Date  . Asthma   . Atrial fibrillation (HCC)    a. admx with AFib with RVR and acute systolic CHF 07/2013; TEE with LAA clot and no DCCV done;    Marland Kitchen Chronic systolic heart failure (HCC)   . Coronary artery disease    a.  LHC (07/17/13):  LM 20%, ostial CFX 20-30%  . Depression   . Hyperlipemia   . Hypothyroidism    past hx  . Nephrolithiasis 2013   "passed them"  . NICM (nonischemic cardiomyopathy) (HCC)    a. Echo (07/17/13):  EF 25-30%, diff HK, basal and mid anteroseptum/ basal inferoseptum and apical AK, trivial AI, mod MR, mod LAE, mild reduced RVSF, mild RAE  . Obesity   . Osteopenia     Past  Surgical History:  Procedure Laterality Date  . ABDOMINAL HYSTERECTOMY  ~ 2001  . ABLATION  10/28/13   PVI by Dr Johney Frame  . APPENDECTOMY  1978  . ATRIAL FIBRILLATION ABLATION N/A 10/28/2013   Procedure: ATRIAL FIBRILLATION ABLATION;  Surgeon: Gardiner Rhyme, MD;  Location: [facility] CATH LAB;  Service: Cardiovascular;  Laterality: N/A;  . CARDIAC CATHETERIZATION  07/2013  . CARDIOVERSION N/A 08/18/2013   Procedure: CARDIOVERSION;  Surgeon: Donato Schultz, MD;  Location: [facility] ENDOSCOPY;  Service: Cardiovascular;  Laterality: N/A;  . CARDIOVERSION N/A 08/29/2013   Procedure: CARDIOVERSION;  Surgeon: Pricilla Riffle, MD;  Location: [facility] OR;  Service: Cardiovascular;  Laterality: N/A;  . ELECTROPHYSIOLOGIC STUDY N/A 11/05/2014   Procedure: Atrial Fibrillation Ablation;  Surgeon: Hillis Range, MD;  Location: [facility] INVASIVE CV LAB;  Service: Cardiovascular;  Laterality: N/A;  . implantable loop recorder removal  10/02/2018   MDT Reveal LINQ removed in office by Dr Johney Frame for EOL battery  . IR SACROPLASTY BILATERAL  02/06/2019  . LEFT HEART CATHETERIZATION WITH CORONARY ANGIOGRAM N/A 07/17/2013   Procedure: LEFT HEART CATHETERIZATION WITH CORONARY ANGIOGRAM;  Surgeon: Micheline Chapman, MD;  Location: [facility] CATH LAB;  Service: Cardiovascular;  Laterality: N/A;  . LOOP RECORDER IMPLANT N/A 06/05/2014   Procedure: LOOP RECORDER IMPLANT;  Surgeon: Hillis Range, MD;  Location: [facility] CATH LAB;  Service: Cardiovascular;  Laterality:  N/A;  . TEE WITHOUT CARDIOVERSION N/A 07/18/2013   Procedure: TRANSESOPHAGEAL ECHOCARDIOGRAM (TEE);  Surgeon: Lelon Perla, MD;  Location: [facility] ENDOSCOPY;  Service: Cardiovascular;  Laterality: N/A;  . TEE WITHOUT CARDIOVERSION N/A 08/18/2013   Procedure: TRANSESOPHAGEAL ECHOCARDIOGRAM (TEE);  Surgeon: Candee Furbish, MD;  Location: [facility] ENDOSCOPY;  Service: Cardiovascular;  Laterality: N/A;  . TEE WITHOUT CARDIOVERSION N/A 10/27/2013   Procedure: TRANSESOPHAGEAL ECHOCARDIOGRAM (TEE);  Surgeon: Fay Records, MD;   Location: [place];  Service: Cardiovascular;  Laterality: N/A;  . TEE WITHOUT CARDIOVERSION N/A 11/04/2014   Procedure: TRANSESOPHAGEAL ECHOCARDIOGRAM (TEE);  Surgeon: Pixie Casino, MD;  Location: [facility] ENDOSCOPY;  Service: Cardiovascular;  Laterality: N/A;    Current Outpatient Medications  Medication Sig Dispense Refill  . acetaminophen (TYLENOL) 500 MG tablet Take 1,500 mg by mouth every 6 (six) hours as needed for mild pain or headache.     Marland Kitchen atorvastatin (LIPITOR) 40 MG tablet TAKE 1 TABLET BY MOUTH EVERY DAY AT 6PM 90 tablet 2  . buPROPion (WELLBUTRIN XL) 150 MG 24 hr tablet Take 150 mg by mouth daily.    Marland Kitchen ELIQUIS 5 MG TABS tablet TAKE 1 TABLET BY MOUTH TWICE A DAY 180 tablet 2  . flecainide (TAMBOCOR) 100 MG tablet TAKE 1 TABLET BY MOUTH TWICE A DAY 180 tablet 2  . furosemide (LASIX) 20 MG tablet TAKE 1 TABLET BY MOUTH EVERY OTHER DAY 45 tablet 3  . gabapentin (NEURONTIN) 300 MG capsule Take 300 mg by mouth at bedtime.    Marland Kitchen lisinopril (ZESTRIL) 5 MG tablet TAKE 1 TABLET BY MOUTH EVERY DAY 90 tablet 3  . metoprolol tartrate (LOPRESSOR) 25 MG tablet TAKE 0.5 TABLETS (12.5 MG TOTAL) BY MOUTH AS DIRECTED. 45 tablet 2  . potassium chloride (K-DUR) 10 MEQ tablet Take 1 tablet (10 mEq total) by mouth every other day. When taking Lasix 45 tablet 3  . sertraline (ZOLOFT) 100 MG tablet Take 200 mg by mouth every morning.     Marland Kitchen tiZANidine (ZANAFLEX) 4 MG tablet Take 4 mg by mouth at bedtime.    . traMADol (ULTRAM) 50 MG tablet Take 50 mg by mouth every 8 (eight) hours as needed for moderate pain.     No current facility-administered medications for this visit.   Facility-Administered Medications Ordered in Other Visits  Medication Dose Route Frequency Provider Last Rate Last Admin  . 0.9 %  sodium chloride infusion   Intravenous Continuous Monia Sabal, PA-C      . 0.9 %  sodium chloride infusion   Intravenous Continuous Monia Sabal, PA-C        Allergies:   Azithromycin   Social  History:  The patient  reports that she has never smoked. She has never used smokeless tobacco. She reports that she does not drink alcohol or use drugs.   ROS:  Please see the history of present illness.   All other systems are personally reviewed and negative.    Exam:    Vital Signs:  Pulse 62   Ht 5\' 7"  (1.702 m)   Wt 190 lb (86.2 kg)   BMI 29.76 kg/m   Well sounding and appearing, alert and conversant, regular work of breathing,  good skin color Eyes- anicteric, neuro- grossly intact, skin- no apparent rash or lesions or cyanosis, mouth- oral mucosa is pink  Labs/Other Tests and Data Reviewed:    Recent Labs: 02/06/2019: BUN 14; Creatinine, Ser 0.79; Hemoglobin 12.9; Platelets 174; Potassium 3.7; Sodium 142   Wt Readings  from Last 3 Encounters:  05/28/19 190 lb (86.2 kg)  02/06/19 190 lb (86.2 kg)  10/02/18 188 lb (85.3 kg)       ASSESSMENT & PLAN:    1.  Paroxysmal atrial fibrillation Doing well post ablation with flecainide The importance of regular follow-up on flecainide is considered to avoid toxicity She is on eliquis for stroke prevention.  Her chads2vasc score is low  2. Previous tachycardia mediated CM Resolved with sinus  3. HL Continue statin We reviewed last 3 years of lipid data   Follow-up:  6 months with me   Patient Risk:  after full review of this patients clinical status, I feel that they are at moderate risk at this time.  Today, I have spent 15 minutes with the patient with telehealth technology discussing arrhythmia management .    Randolm Idol, MD  05/28/2019 11:37 AM     [facility] [address] [place] [address] [phone] (office) [phone] (fax)

## 2019-06-05 DIAGNOSIS — E7800 Pure hypercholesterolemia, unspecified: Secondary | ICD-10-CM | POA: Diagnosis not present

## 2019-06-05 DIAGNOSIS — E559 Vitamin D deficiency, unspecified: Secondary | ICD-10-CM | POA: Diagnosis not present

## 2019-06-14 ENCOUNTER — Other Ambulatory Visit: Payer: Self-pay | Admitting: Internal Medicine

## 2019-06-26 ENCOUNTER — Other Ambulatory Visit: Payer: Self-pay | Admitting: Internal Medicine

## 2019-07-09 DIAGNOSIS — M8589 Other specified disorders of bone density and structure, multiple sites: Secondary | ICD-10-CM | POA: Diagnosis not present

## 2019-07-09 DIAGNOSIS — E559 Vitamin D deficiency, unspecified: Secondary | ICD-10-CM | POA: Diagnosis not present

## 2019-07-09 DIAGNOSIS — I4891 Unspecified atrial fibrillation: Secondary | ICD-10-CM | POA: Diagnosis not present

## 2019-07-09 DIAGNOSIS — I10 Essential (primary) hypertension: Secondary | ICD-10-CM | POA: Diagnosis not present

## 2019-07-09 DIAGNOSIS — E7800 Pure hypercholesterolemia, unspecified: Secondary | ICD-10-CM | POA: Diagnosis not present

## 2019-07-18 DIAGNOSIS — E663 Overweight: Secondary | ICD-10-CM

## 2019-07-18 DIAGNOSIS — M545 Low back pain: Secondary | ICD-10-CM | POA: Diagnosis not present

## 2019-07-18 HISTORY — DX: Overweight: E66.3

## 2019-08-05 DIAGNOSIS — M545 Low back pain: Secondary | ICD-10-CM | POA: Diagnosis not present

## 2019-08-13 DIAGNOSIS — M5127 Other intervertebral disc displacement, lumbosacral region: Secondary | ICD-10-CM | POA: Diagnosis not present

## 2019-08-13 DIAGNOSIS — M545 Low back pain: Secondary | ICD-10-CM | POA: Diagnosis not present

## 2019-08-26 ENCOUNTER — Telehealth: Payer: Self-pay | Admitting: *Deleted

## 2019-08-26 ENCOUNTER — Other Ambulatory Visit: Payer: Self-pay | Admitting: Student

## 2019-08-26 DIAGNOSIS — M4316 Spondylolisthesis, lumbar region: Secondary | ICD-10-CM

## 2019-08-26 NOTE — Telephone Encounter (Signed)
   [facility] Pre-operative Risk Assessment    HEARTCARE STAFF: - Please ensure there is not already an duplicate clearance open for this procedure. - Under Visit Info/Reason for Call, type in Other and utilize the format Clearance MM/DD/YY or Clearance TBD. Do not use dashes or single digits. - If request is for dental extraction, please clarify the # of teeth to be extracted.  Request for surgical clearance:  1. What type of surgery is being performed? LUMBAR NRB   2. When is this surgery scheduled? TBD   3. What type of clearance is required (medical clearance vs. Pharmacy clearance to hold med vs. Both)? BOTH  4. Are there any medications that need to be held prior to surgery and how long? ELIQUIS x 2 DAYS PRIOR TO INJECTION   5. Practice name and name of physician performing surgery? [facility]; MD NOT LISTED    6. What is the office phone number? [phone]   7.   What is the office fax number? [phone]  8.   Anesthesia type (None, local, MAC, general) ? LOCAL   Julaine Hua 08/26/2019, 1:31 PM  _________________________________________________________________   (provider comments below)

## 2019-08-27 ENCOUNTER — Telehealth: Payer: Self-pay | Admitting: Cardiology

## 2019-08-27 NOTE — Telephone Encounter (Signed)
     I went in pt's chart to see who had called her. It was Pre-Op, I contacted Franky Macho about  The call. He was working remotely, so he was going to call pt back

## 2019-08-27 NOTE — Telephone Encounter (Signed)
Patient with diagnosis of afib on Eliquis for anticoagulation.    Procedure: lumbar nerve root block  Date of procedure: TBD  CHADS2-VASc score of 3 (sex, CHF, CAD)  CrCl 135mL/min Platelet count 174K  Per office protocol, patient can hold Eliquis for 3 days prior to procedure.

## 2019-08-27 NOTE — Telephone Encounter (Signed)
   Primary Cardiologist: Hillis Range, MD  Chart reviewed and patient contacted by phone today as part of pre-operative protocol coverage. Given past medical history and time since last visit, based on ACC/AHA guidelines, Kiara [name] would be at acceptable risk for the planned procedure without further cardiovascular testing.   Ok to hold Eliquis 3 days pre op- resume as soon as possible post op.  I relayed these instructions to the patient.   I will route this recommendation to the requesting party via Epic fax function and remove from pre-op pool.  Please call with questions.  Corine Shelter, PA-C 08/27/2019, 9:26 AM

## 2019-09-01 ENCOUNTER — Other Ambulatory Visit: Payer: Self-pay

## 2019-09-01 ENCOUNTER — Ambulatory Visit
Admission: RE | Admit: 2019-09-01 | Discharge: 2019-09-01 | Disposition: A | Discharge status: Home / Self Care | Payer: BC Managed Care – PPO | Source: Ambulatory Visit | Attending: Student | Admitting: Student

## 2019-09-01 ENCOUNTER — Other Ambulatory Visit: Payer: Self-pay | Admitting: Student

## 2019-09-01 DIAGNOSIS — M4316 Spondylolisthesis, lumbar region: Secondary | ICD-10-CM

## 2019-09-01 DIAGNOSIS — M545 Low back pain: Secondary | ICD-10-CM | POA: Diagnosis not present

## 2019-09-01 MED ORDER — METHYLPREDNISOLONE ACETATE 40 MG/ML INJ SUSP (RADIOLOG
120.0000 mg | Freq: Once | INTRAMUSCULAR | Status: AC
  Administered 2019-09-01: 120 mg via EPIDURAL

## 2019-09-01 MED ORDER — IOPAMIDOL (ISOVUE-M 200) INJECTION 41%
1.0000 mL | Freq: Once | INTRAMUSCULAR | Status: AC
  Administered 2019-09-01: 1 mL via EPIDURAL

## 2019-09-01 NOTE — Discharge Instructions (Signed)
Spinal Injection Discharge Instruction Sheet  1. You may resume a regular diet and any medications that you routinely take, including pain medications.  2. No driving the rest of the day of the procedure.  3. Light activity throughout the rest of the day.  Do not do any strenuous work, exercise, bending or lifting.  The day following the procedure, you may resume normal physical activity but you should refrain from exercising or physical therapy for at least three days.   Common Side Effects:   Headaches- take your usual medications as directed by your physician.     Restlessness or inability to sleep- you may have trouble sleeping for the next few days.  Ask your referring physician if you need any medication for sleep if over the counter sleep medications do not help.   Facial flushing or redness- this should subside within a few days.   Increased pain- a temporary increase in pain a day or two following your procedure is not unusual.  Take your pain medication as prescribed by your referring physician.  You may use ice to the injection site as needed.  Please do not use heat for 24 hours.   Leg cramps  Please contact our office at [phone] for the following symptoms:  Fever greater than 100 degrees.  Headaches unresolved with medication after 2-3 days.  Increased swelling, pain, or redness at injection site.  Thank you for visiting our office.   You may resume Eliquis tomorrow morning. 

## 2019-09-11 ENCOUNTER — Other Ambulatory Visit: Payer: Self-pay | Admitting: Internal Medicine

## 2019-09-22 DIAGNOSIS — E7800 Pure hypercholesterolemia, unspecified: Secondary | ICD-10-CM | POA: Insufficient documentation

## 2019-09-22 DIAGNOSIS — F411 Generalized anxiety disorder: Secondary | ICD-10-CM

## 2019-09-22 DIAGNOSIS — F329 Major depressive disorder, single episode, unspecified: Secondary | ICD-10-CM | POA: Insufficient documentation

## 2019-09-22 HISTORY — DX: Pure hypercholesterolemia, unspecified: E78.00

## 2019-09-22 HISTORY — DX: Generalized anxiety disorder: F41.1

## 2019-09-22 HISTORY — DX: Major depressive disorder, single episode, unspecified: F32.9

## 2019-10-09 ENCOUNTER — Other Ambulatory Visit: Payer: Self-pay | Admitting: Neurological Surgery

## 2019-10-09 ENCOUNTER — Telehealth: Payer: Self-pay | Admitting: *Deleted

## 2019-10-09 NOTE — Telephone Encounter (Signed)
   [facility] Pre-operative Risk Assessment    HEARTCARE STAFF: - Please ensure there is not already an duplicate clearance open for this procedure. - Under Visit Info/Reason for Call, type in Other and utilize the format Clearance MM/DD/YY or Clearance TBD. Do not use dashes or single digits. - If request is for dental extraction, please clarify the # of teeth to be extracted.  Request for surgical clearance:  1. What type of surgery is being performed? L2-3, L3-4 FUSION w/EXTENSION OF HARDWARE   2. When is this surgery scheduled? 11/26/19   3. What type of clearance is required (medical clearance vs. Pharmacy clearance to hold med vs. Both)? BOTH  4. Are there any medications that need to be held prior to surgery and how long? ELIQUIS   5. Practice name and name of physician performing surgery? [place]; DR. Sherley Bounds   6. What is the office phone number? [phone]   7.   What is the office fax number? [place]: VANESSA  8.   Anesthesia type (None, local, MAC, general) ? GENERAL    Julaine Hua 10/09/2019, 4:04 PM  _________________________________________________________________   (provider comments below)

## 2019-10-10 NOTE — Telephone Encounter (Signed)
Mrs. Hosking has a history of Atrial fib and in on Eliquis. Surgical procedure is scheduled on 11/26/2019. Please make recommendations for Eliquis pre-operatively.   Thank you,  Samara Deist

## 2019-10-10 NOTE — Telephone Encounter (Signed)
Patient with diagnosis of afib on Eliquis for anticoagulation.    Procedure:  L2-3, L3-4 FUSION w/EXTENSION OF HARDWARE  Date of procedure: 11/26/19  CHADS2-VASc score of  3 (CHF,CAD, female)  CrCl 84 ml/min  Per office protocol, patient can hold Eliquis for 3 days prior to procedure.

## 2019-10-10 NOTE — Telephone Encounter (Signed)
   Primary Cardiologist: Hillis Range, MD  Chart reviewed as part of pre-operative protocol coverage. Given past medical history and time since last visit, based on ACC/AHA guidelines, Kiara [name] would be at acceptable risk for the planned procedure without further cardiovascular testing.   Patient with diagnosis of afib on Eliquis for anticoagulation.    Procedure: L2-3, L3-4 FUSION w/EXTENSION OF HARDWARE Date of procedure: 11/26/19  CHADS2-VASc score of  3 (CHF,CAD, female)  CrCl 84 ml/min  Per office protocol, patient can hold Eliquis for 3 days prior to procedure.  I will route this recommendation to the requesting party via Epic fax function and remove from pre-op pool.  Please call with questions.  Bettey Mare. Mirayah Wren DNP, ANP, AACC  10/10/2019, 8:18 AM

## 2019-11-17 ENCOUNTER — Other Ambulatory Visit: Payer: Self-pay | Admitting: Internal Medicine

## 2019-11-19 ENCOUNTER — Other Ambulatory Visit (HOSPITAL_COMMUNITY): Payer: BC Managed Care – PPO

## 2019-11-20 ENCOUNTER — Other Ambulatory Visit: Payer: Self-pay | Admitting: Internal Medicine

## 2019-11-24 ENCOUNTER — Encounter (HOSPITAL_COMMUNITY): Payer: Self-pay

## 2019-11-24 ENCOUNTER — Other Ambulatory Visit: Payer: Self-pay

## 2019-11-24 ENCOUNTER — Encounter (HOSPITAL_COMMUNITY)
Admission: RE | Admit: 2019-11-24 | Discharge: 2019-11-24 | Disposition: A | Discharge status: Home / Self Care | Payer: BC Managed Care – PPO | Source: Ambulatory Visit | Attending: Neurological Surgery | Admitting: Neurological Surgery

## 2019-11-24 ENCOUNTER — Other Ambulatory Visit (HOSPITAL_COMMUNITY)
Admission: RE | Admit: 2019-11-24 | Discharge: 2019-11-24 | Disposition: A | Discharge status: Home / Self Care | Payer: BC Managed Care – PPO | Source: Ambulatory Visit | Attending: Neurological Surgery | Admitting: Neurological Surgery

## 2019-11-24 DIAGNOSIS — F419 Anxiety disorder, unspecified: Secondary | ICD-10-CM | POA: Insufficient documentation

## 2019-11-24 DIAGNOSIS — Z01818 Encounter for other preprocedural examination: Secondary | ICD-10-CM | POA: Insufficient documentation

## 2019-11-24 DIAGNOSIS — Z7901 Long term (current) use of anticoagulants: Secondary | ICD-10-CM | POA: Insufficient documentation

## 2019-11-24 DIAGNOSIS — M8580 Other specified disorders of bone density and structure, unspecified site: Secondary | ICD-10-CM | POA: Insufficient documentation

## 2019-11-24 DIAGNOSIS — I2510 Atherosclerotic heart disease of native coronary artery without angina pectoris: Secondary | ICD-10-CM | POA: Insufficient documentation

## 2019-11-24 DIAGNOSIS — E785 Hyperlipidemia, unspecified: Secondary | ICD-10-CM | POA: Insufficient documentation

## 2019-11-24 DIAGNOSIS — M419 Scoliosis, unspecified: Secondary | ICD-10-CM | POA: Insufficient documentation

## 2019-11-24 DIAGNOSIS — Z20822 Contact with and (suspected) exposure to covid-19: Secondary | ICD-10-CM | POA: Insufficient documentation

## 2019-11-24 DIAGNOSIS — Z9079 Acquired absence of other genital organ(s): Secondary | ICD-10-CM | POA: Insufficient documentation

## 2019-11-24 DIAGNOSIS — E669 Obesity, unspecified: Secondary | ICD-10-CM | POA: Insufficient documentation

## 2019-11-24 DIAGNOSIS — I428 Other cardiomyopathies: Secondary | ICD-10-CM | POA: Insufficient documentation

## 2019-11-24 DIAGNOSIS — E039 Hypothyroidism, unspecified: Secondary | ICD-10-CM | POA: Insufficient documentation

## 2019-11-24 DIAGNOSIS — J45909 Unspecified asthma, uncomplicated: Secondary | ICD-10-CM | POA: Insufficient documentation

## 2019-11-24 DIAGNOSIS — Z981 Arthrodesis status: Secondary | ICD-10-CM | POA: Insufficient documentation

## 2019-11-24 DIAGNOSIS — Z791 Long term (current) use of non-steroidal anti-inflammatories (NSAID): Secondary | ICD-10-CM | POA: Insufficient documentation

## 2019-11-24 DIAGNOSIS — Z6833 Body mass index (BMI) 33.0-33.9, adult: Secondary | ICD-10-CM | POA: Insufficient documentation

## 2019-11-24 DIAGNOSIS — Z79899 Other long term (current) drug therapy: Secondary | ICD-10-CM | POA: Insufficient documentation

## 2019-11-24 DIAGNOSIS — I5022 Chronic systolic (congestive) heart failure: Secondary | ICD-10-CM | POA: Insufficient documentation

## 2019-11-24 DIAGNOSIS — Z01812 Encounter for preprocedural laboratory examination: Secondary | ICD-10-CM | POA: Insufficient documentation

## 2019-11-24 HISTORY — DX: Personal history of urinary calculi: Z87.442

## 2019-11-24 HISTORY — DX: Anxiety disorder, unspecified: F41.9

## 2019-11-24 LAB — CBC
HCT: 43.8 % (ref 36.0–46.0)
Hemoglobin: 14 g/dL (ref 12.0–15.0)
MCH: 29.7 pg (ref 26.0–34.0)
MCHC: 32 g/dL (ref 30.0–36.0)
MCV: 93 fL (ref 80.0–100.0)
Platelets: 183 10*3/uL (ref 150–400)
RBC: 4.71 MIL/uL (ref 3.87–5.11)
RDW: 12.8 % (ref 11.5–15.5)
WBC: 7.5 10*3/uL (ref 4.0–10.5)
nRBC: 0 % (ref 0.0–0.2)

## 2019-11-24 LAB — BASIC METABOLIC PANEL
Anion gap: 9 (ref 5–15)
BUN: 10 mg/dL (ref 8–23)
CO2: 26 mmol/L (ref 22–32)
Calcium: 9.3 mg/dL (ref 8.9–10.3)
Chloride: 107 mmol/L (ref 98–111)
Creatinine, Ser: 0.84 mg/dL (ref 0.44–1.00)
GFR, Estimated: >60 mL/min (ref >60)
Glucose, Bld: 111 mg/dL — ABNORMAL HIGH (ref 70–99)
Potassium: 4.1 mmol/L (ref 3.5–5.1)
Sodium: 142 mmol/L (ref 135–145)

## 2019-11-24 LAB — TYPE AND SCREEN
ABO/RH(D): O POS
Antibody Screen: NEGATIVE

## 2019-11-24 LAB — SURGICAL PCR SCREEN
MRSA, PCR: NEGATIVE
Staphylococcus aureus: NEGATIVE

## 2019-11-24 LAB — SARS CORONAVIRUS 2 (TAT 6-24 HRS): SARS Coronavirus 2: NEGATIVE

## 2019-11-24 NOTE — Anesthesia Preprocedure Evaluation (Addendum)
Anesthesia Evaluation  Patient identified by MRN, date of birth, ID band Patient awake    Reviewed: Allergy & Precautions, NPO status , Patient's Chart, lab work & pertinent test results  Airway Mallampati: II  TM Distance: >3 FB Neck ROM: Full    Dental  (+) Teeth Intact, Dental Advisory Given   Pulmonary    breath sounds clear to auscultation       Cardiovascular  Rhythm:Regular Rate:Normal     Neuro/Psych    GI/Hepatic   Endo/Other    Renal/GU      Musculoskeletal   Abdominal   Peds  Hematology   Anesthesia Other Findings   Reproductive/Obstetrics                            Anesthesia Physical Anesthesia Plan  ASA: III  Anesthesia Plan: General   Post-op Pain Management:    Induction: Intravenous  PONV Risk Score and Plan: Ondansetron and Dexamethasone  Airway Management Planned: Oral ETT  Additional Equipment:   Intra-op Plan:   Post-operative Plan: Extubation in OR  Informed Consent: I have reviewed the patients History and Physical, chart, labs and discussed the procedure including the risks, benefits and alternatives for the proposed anesthesia with the patient or authorized representative who has indicated his/her understanding and acceptance.     Dental advisory given  Plan Discussed with: CRNA and Anesthesiologist  Anesthesia Plan Comments: (PAT note written 11/24/2019 by Shonna Chock, PA-C. )       Anesthesia Quick Evaluation

## 2019-11-24 NOTE — Progress Notes (Signed)
Your procedure is scheduled on Wednesday, November 26, 2019.  Report to [facility] Main Entrance "A" at 6:30 A.M., and check in at the Admitting office.  Call this number if you have problems the morning of surgery:  [phone]  Call [phone] if you have any questions prior to your surgery date Monday-Friday 8am-4pm    Remember:  Do not eat or drink after midnight the night before your surgery    Take these medicines the morning of surgery with A SIP OF WATER:  buPROPion (WELLBUTRIN XL) flecainide (TAMBOCOR) gabapentin (NEURONTIN)  sertraline (ZOLOFT)  acetaminophen (TYLENOL) - if needed metoprolol tartrate (LOPRESSOR) - if needed  As of today, STOP taking any Aspirin (unless otherwise instructed by your surgeon) Aleve, Naproxen, Ibuprofen, Motrin, Advil, Goody's, BC's, all herbal medications, fish oil, and all vitamins.                      Do not wear jewelry, make up, or nail polish            Do not wear lotions, powders, perfumes, or deodorant.            Do not shave 48 hours prior to surgery.            Do not bring valuables to the hospital.            [facility] is not responsible for any belongings or valuables.  Do NOT Smoke (Tobacco/Vaping) or drink Alcohol 24 hours prior to your procedure If you use a CPAP at night, you may bring all equipment for your overnight stay.   Contacts, glasses, dentures or bridgework may not be worn into surgery.      For patients admitted to the hospital, discharge time will be determined by your treatment team.   Patients discharged the day of surgery will not be allowed to drive home, and someone needs to stay with them for 24 hours.    Special instructions:   [place]- Preparing For Surgery  Before surgery, you can play an important role. Because skin is not sterile, your skin needs to be as free of germs as possible. You can reduce the number of germs on your skin by washing with CHG (chlorahexidine gluconate) Soap  before surgery.  CHG is an antiseptic cleaner which kills germs and bonds with the skin to continue killing germs even after washing.    Oral Hygiene is also important to reduce your risk of infection.  Remember - BRUSH YOUR TEETH THE MORNING OF SURGERY WITH YOUR REGULAR TOOTHPASTE  Please do not use if you have an allergy to CHG or antibacterial soaps. If your skin becomes reddened/irritated stop using the CHG.  Do not shave (including legs and underarms) for at least 48 hours prior to first CHG shower. It is OK to shave your face.  Please follow these instructions carefully.   1. Shower the NIGHT BEFORE SURGERY and the MORNING OF SURGERY with CHG Soap.   2. If you chose to wash your hair, wash your hair first as usual with your normal shampoo.  3. After you shampoo, rinse your hair and body thoroughly to remove the shampoo.  4. Use CHG as you would any other liquid soap. You can apply CHG directly to the skin and wash gently with a scrungie or a clean washcloth.   5. Apply the CHG Soap to your body ONLY FROM THE NECK DOWN.  Do not use on open wounds or open  sores. Avoid contact with your eyes, ears, mouth and genitals (private parts). Wash Face and genitals (private parts)  with your normal soap.   6. Wash thoroughly, paying special attention to the area where your surgery will be performed.  7. Thoroughly rinse your body with warm water from the neck down.  8. DO NOT shower/wash with your normal soap after using and rinsing off the CHG Soap.  9. Pat yourself dry with a CLEAN TOWEL.  10. Wear CLEAN PAJAMAS to bed the night before surgery  11. Place CLEAN SHEETS on your bed the night of your first shower and DO NOT SLEEP WITH PETS.   Day of Surgery: Wear Clean/Comfortable clothing the morning of surgery Do not apply any deodorants/lotions.   Remember to brush your teeth WITH YOUR REGULAR TOOTHPASTE.   Please read over the following fact sheets that you were given.

## 2019-11-24 NOTE — Progress Notes (Signed)
Sent staff message to Dr. Marikay Alar to sign the sign and held order so that consent could be obtained at PAT appt.  PCP - Dr. [name] in [facility] Cardiologist - Dr. Caryn Bee with [facility]  Had loop recorder that died after three years.  A fib under control and recorder removed spring 2019.  Dr. Johney Frame manages PPM/ICD - Denies  Chest x-ray - Not indicated EKG - 11/24/19 Stress Test - 10/20/15 ECHO - 02/11/18 Cardiac Cath - Denies  Sleep Study - denies OSA  DM - Denies  Blood Thinner Instructions:Eliquis - per Dr. Johney Frame holding 3 days prior to surgery. Last dose on 11/22/19   COVID TEST- 11/24/19  Anesthesia review: Yes Cardiac history - a fib  Patient denies shortness of breath, fever, cough and chest pain at PAT appointment   All instructions explained to the patient, with a verbal understanding of the material. Patient agrees to go over the instructions while at home for a better understanding. Patient also instructed to self quarantine after being tested for COVID-19. The opportunity to ask questions was provided.

## 2019-11-24 NOTE — Progress Notes (Signed)
Anesthesia Chart Review:  Case: [id number] Date/Time: 11/26/19 0815   Procedure: PLIF - L2-L3 - L3-L4 with extension of hardware (N/A Back)   Anesthesia type: General   Pre-op diagnosis: Scoliosis   [facility] / [facility]   Surgeons: Tia Alert, MD      DISCUSSION: Patient is a 59 year old female scheduled for the above procedure.  History includes never smoker, afib (s/p DCCV 08/18/13, 08/29/13; afib ablation 10/28/13 & 11/05/14; [employer] loop recorder implanted 06/05/14, explanted 10/02/18), non-ischemic cardiomyopathy (tachy-mediated), chronic systolic CHF (LVEF 25-30% 07/2013, 50-55% 02/11/18), CAD (minor CAD 07/16/13), HLD, hypothyroidism, asthma, anxiety, back surgery (L4-5 PLIF 2019), hysterectomy (06/11/02), sacroplasty 02/06/19. BMI is consistent with obesity.   Preoperative cardiology input outlined on 10/10/19 by Joni Reining, NP: "Given past medical history and time since last visit, based on ACC/AHA guidelines, Kiara [name] would be at acceptable risk for the planned procedure without further cardiovascular testing... CHADS2-VASc score of3(CHF,CAD, female).. Per office protocol, patient can holdEliquisfor 3days prior to procedure." Last Eliquis 11/22/2019.  Presurgical COVID-19 test from 11/24/2019 is still in process.  Anesthesia team to evaluate on the day of surgery.   VS: BP 115/69   Pulse (!) 53   Temp 36.6 C (Oral)   Resp 17   Ht 5\' 6"  (1.676 m)   Wt 93.2 kg   SpO2 97%   BMI 33.17 kg/m     PROVIDERS: , MD his PCP [name], MD is EP cardiologist. Telemedicine visit 05/28/19.   LABS: Labs reviewed: Acceptable for surgery. (all labs ordered are listed, but only abnormal results are displayed)  Labs Reviewed  BASIC METABOLIC PANEL - Abnormal; Notable for the following components:      Result Value   Glucose, Bld 111 (*)    All other components within normal limits  SURGICAL PCR SCREEN  CBC  TYPE AND SCREEN    IMAGES: MRI  L-spine 08/13/19 (report in [facility]/PACS) IMPRESSION: 1. Advanced lumbar degeneration with scoliosis. 2. High-grade right-sided foraminal impingement at L2-3 and L3-4, progressed from 2020. 3. Up to moderate spinal stenosis at L2-3. Subarticular recess narrowings are noted above. 4. Interval bilateral sacroplasty. No acute insufficiency fracture.   EKG: 11/24/19:  Ventricular rate 58 bpm Sinus bradycardia with Premature atrial complexes Otherwise normal ECG   CV: Echo 02/11/18: Study Conclusions  - Left ventricle: The cavity size was normal. Wall thickness was  normal. Systolic function was normal. The estimated ejection  fraction was in the range of 50% to 55%. Wall motion was normal;  there were no regional wall motion abnormalities.  - Left atrium: The atrium was mildly dilated.  Impressions:  - Low normal LV systolic function; mild LAE.  (Comparison: LVEF 55-60% 11/04/14 TEE, 50-55% 01/20/14 TTE, 35-40% 08/18/13 TEE, 25-30% 07/17/13 TTE)  Cardiac cath 07/16/13: Final Conclusions:   1. Minor nonobstructive CAD [ < 20% LM; 20-30% ostial LCX) 2. New diagnosis atrial fibrillation with a nonischemic cardiomyopathy (suspect tachycardia mediated)   Past Medical History:  Diagnosis Date  . Anxiety   . Asthma   . Atrial fibrillation (HCC)    a. admx with AFib with RVR and acute systolic CHF 07/2013; TEE with LAA clot and no DCCV done;    . CHF (congestive heart failure) (HCC) 2015  . Chronic systolic heart failure (HCC)   . Coronary artery disease    a.  LHC (07/17/13):  LM 20%, ostial CFX 20-30%  . Depression   . Dysrhythmia 2015   A  fib  . History of kidney stones   . Hyperlipemia   . Hypothyroidism    past hx  . Nephrolithiasis 2013   "passed them"  . NICM (nonischemic cardiomyopathy) (HCC)    a. Echo (07/17/13):  EF 25-30%, diff HK, basal and mid anteroseptum/ basal inferoseptum and apical AK, trivial AI, mod MR, mod LAE, mild reduced RVSF, mild RAE  . Obesity   .  Osteopenia     Past Surgical History:  Procedure Laterality Date  . ABDOMINAL HYSTERECTOMY  ~ 2001  . ABLATION  10/28/13   PVI by Dr Johney Frame  . APPENDECTOMY  1978  . ATRIAL FIBRILLATION ABLATION N/A 10/28/2013   Procedure: ATRIAL FIBRILLATION ABLATION;  Surgeon: Gardiner Rhyme, MD;  Location: [facility] CATH LAB;  Service: Cardiovascular;  Laterality: N/A;  . BACK SURGERY Bilateral 2019   Fusion  . CARDIAC CATHETERIZATION  07/2013  . CARDIOVERSION N/A 08/18/2013   Procedure: CARDIOVERSION;  Surgeon: Donato Schultz, MD;  Location: [facility] ENDOSCOPY;  Service: Cardiovascular;  Laterality: N/A;  . CARDIOVERSION N/A 08/29/2013   Procedure: CARDIOVERSION;  Surgeon: Pricilla Riffle, MD;  Location: [facility] OR;  Service: Cardiovascular;  Laterality: N/A;  . ELECTROPHYSIOLOGIC STUDY N/A 11/05/2014   Procedure: Atrial Fibrillation Ablation;  Surgeon: Hillis Range, MD;  Location: [facility] INVASIVE CV LAB;  Service: Cardiovascular;  Laterality: N/A;  . EYE SURGERY Bilateral 1999   Corrective laser surgery  . implantable loop recorder removal  10/02/2018   MDT Reveal LINQ removed in office by Dr Johney Frame for EOL battery  . IR SACROPLASTY BILATERAL  02/06/2019  . LEFT HEART CATHETERIZATION WITH CORONARY ANGIOGRAM N/A 07/17/2013   Procedure: LEFT HEART CATHETERIZATION WITH CORONARY ANGIOGRAM;  Surgeon: Micheline Chapman, MD;  Location: [facility] CATH LAB;  Service: Cardiovascular;  Laterality: N/A;  . LOOP RECORDER IMPLANT N/A 06/05/2014   Procedure: LOOP RECORDER IMPLANT;  Surgeon: Hillis Range, MD;  Location: [facility] CATH LAB;  Service: Cardiovascular;  Laterality: N/A;  . TEE WITHOUT CARDIOVERSION N/A 07/18/2013   Procedure: TRANSESOPHAGEAL ECHOCARDIOGRAM (TEE);  Surgeon: Lewayne Bunting, MD;  Location: [facility] ENDOSCOPY;  Service: Cardiovascular;  Laterality: N/A;  . TEE WITHOUT CARDIOVERSION N/A 08/18/2013   Procedure: TRANSESOPHAGEAL ECHOCARDIOGRAM (TEE);  Surgeon: Donato Schultz, MD;  Location: [facility] ENDOSCOPY;  Service: Cardiovascular;  Laterality: N/A;  .  TEE WITHOUT CARDIOVERSION N/A 10/27/2013   Procedure: TRANSESOPHAGEAL ECHOCARDIOGRAM (TEE);  Surgeon: Pricilla Riffle, MD;  Location: [facility] ENDOSCOPY;  Service: Cardiovascular;  Laterality: N/A;  . TEE WITHOUT CARDIOVERSION N/A 11/04/2014   Procedure: TRANSESOPHAGEAL ECHOCARDIOGRAM (TEE);  Surgeon: Chrystie Nose, MD;  Location: [facility] ENDOSCOPY;  Service: Cardiovascular;  Laterality: N/A;    MEDICATIONS: . acetaminophen (TYLENOL) 500 MG tablet  . atorvastatin (LIPITOR) 40 MG tablet  . buPROPion (WELLBUTRIN XL) 150 MG 24 hr tablet  . ELIQUIS 5 MG TABS tablet  . flecainide (TAMBOCOR) 100 MG tablet  . furosemide (LASIX) 20 MG tablet  . gabapentin (NEURONTIN) 300 MG capsule  . lisinopril (ZESTRIL) 5 MG tablet  . metoprolol tartrate (LOPRESSOR) 25 MG tablet  . potassium chloride (K-DUR) 10 MEQ tablet  . sertraline (ZOLOFT) 100 MG tablet  . tiZANidine (ZANAFLEX) 4 MG tablet  . traMADol (ULTRAM) 50 MG tablet   No current facility-administered medications for this encounter.   Marland Kitchen 0.9 %  sodium chloride infusion  . 0.9 %  sodium chloride infusion    Shonna Chock, PA-C Surgical Short Stay/Anesthesiology [facility] Phone [phone] [facility] Phone [phone] 11/24/2019 2:30 PM

## 2019-11-26 ENCOUNTER — Inpatient Hospital Stay (HOSPITAL_COMMUNITY): Payer: BC Managed Care – PPO | Admitting: Vascular Surgery

## 2019-11-26 ENCOUNTER — Encounter (HOSPITAL_COMMUNITY)
Admission: RE | Disposition: A | Discharge status: Home / Self Care | Payer: Self-pay | Source: Home / Self Care | Attending: Neurological Surgery

## 2019-11-26 ENCOUNTER — Inpatient Hospital Stay (HOSPITAL_COMMUNITY): Payer: BC Managed Care – PPO | Admitting: Certified Registered Nurse Anesthetist

## 2019-11-26 ENCOUNTER — Inpatient Hospital Stay (HOSPITAL_COMMUNITY)
Admission: RE | Admit: 2019-11-26 | Discharge: 2019-11-27 | DRG: 454 | Disposition: A | Discharge status: Home / Self Care | Payer: BC Managed Care – PPO | Attending: Neurological Surgery | Admitting: Neurological Surgery

## 2019-11-26 ENCOUNTER — Encounter (HOSPITAL_COMMUNITY): Payer: Self-pay | Admitting: Neurological Surgery

## 2019-11-26 ENCOUNTER — Other Ambulatory Visit: Payer: Self-pay

## 2019-11-26 ENCOUNTER — Inpatient Hospital Stay (HOSPITAL_COMMUNITY): Payer: BC Managed Care – PPO

## 2019-11-26 DIAGNOSIS — I4891 Unspecified atrial fibrillation: Secondary | ICD-10-CM | POA: Diagnosis present

## 2019-11-26 DIAGNOSIS — E785 Hyperlipidemia, unspecified: Secondary | ICD-10-CM | POA: Diagnosis present

## 2019-11-26 DIAGNOSIS — Z8249 Family history of ischemic heart disease and other diseases of the circulatory system: Secondary | ICD-10-CM | POA: Diagnosis not present

## 2019-11-26 DIAGNOSIS — Z6833 Body mass index (BMI) 33.0-33.9, adult: Secondary | ICD-10-CM | POA: Diagnosis not present

## 2019-11-26 DIAGNOSIS — F419 Anxiety disorder, unspecified: Secondary | ICD-10-CM | POA: Diagnosis present

## 2019-11-26 DIAGNOSIS — M47816 Spondylosis without myelopathy or radiculopathy, lumbar region: Secondary | ICD-10-CM | POA: Diagnosis present

## 2019-11-26 DIAGNOSIS — Z79899 Other long term (current) drug therapy: Secondary | ICD-10-CM

## 2019-11-26 DIAGNOSIS — Z20822 Contact with and (suspected) exposure to covid-19: Secondary | ICD-10-CM | POA: Diagnosis present

## 2019-11-26 DIAGNOSIS — E039 Hypothyroidism, unspecified: Secondary | ICD-10-CM | POA: Diagnosis present

## 2019-11-26 DIAGNOSIS — I2510 Atherosclerotic heart disease of native coronary artery without angina pectoris: Secondary | ICD-10-CM | POA: Diagnosis present

## 2019-11-26 DIAGNOSIS — E669 Obesity, unspecified: Secondary | ICD-10-CM | POA: Diagnosis present

## 2019-11-26 DIAGNOSIS — F32A Depression, unspecified: Secondary | ICD-10-CM | POA: Diagnosis present

## 2019-11-26 DIAGNOSIS — M48061 Spinal stenosis, lumbar region without neurogenic claudication: Principal | ICD-10-CM | POA: Diagnosis present

## 2019-11-26 DIAGNOSIS — I5022 Chronic systolic (congestive) heart failure: Secondary | ICD-10-CM | POA: Diagnosis present

## 2019-11-26 DIAGNOSIS — Z981 Arthrodesis status: Secondary | ICD-10-CM

## 2019-11-26 DIAGNOSIS — Z888 Allergy status to other drugs, medicaments and biological substances status: Secondary | ICD-10-CM

## 2019-11-26 DIAGNOSIS — M419 Scoliosis, unspecified: Secondary | ICD-10-CM | POA: Diagnosis present

## 2019-11-26 DIAGNOSIS — Z419 Encounter for procedure for purposes other than remedying health state, unspecified: Secondary | ICD-10-CM

## 2019-11-26 LAB — ABO/RH: ABO/RH(D): O POS

## 2019-11-26 SURGERY — POSTERIOR LUMBAR FUSION 2 LEVEL
Anesthesia: General | Site: Back

## 2019-11-26 MED ORDER — GABAPENTIN 300 MG PO CAPS
600.0000 mg | ORAL_CAPSULE | Freq: Every day | ORAL | Status: DC
  Administered 2019-11-26: 600 mg via ORAL
  Filled 2019-11-26: qty 2

## 2019-11-26 MED ORDER — EPHEDRINE SULFATE-NACL 50-0.9 MG/10ML-% IV SOSY
PREFILLED_SYRINGE | INTRAVENOUS | Status: DC | PRN
  Administered 2019-11-26 (×2): 10 mg via INTRAVENOUS

## 2019-11-26 MED ORDER — SENNA 8.6 MG PO TABS
1.0000 | ORAL_TABLET | Freq: Two times a day (BID) | ORAL | Status: DC
  Administered 2019-11-26 (×2): 8.6 mg via ORAL
  Filled 2019-11-26 (×2): qty 1

## 2019-11-26 MED ORDER — CHLORHEXIDINE GLUCONATE 0.12 % MT SOLN: 15.0000 mL | Freq: Once | OROMUCOSAL | Status: AC

## 2019-11-26 MED ORDER — FENTANYL CITRATE (PF) 250 MCG/5ML IJ SOLN
INTRAMUSCULAR | Status: AC
  Filled 2019-11-26: qty 5

## 2019-11-26 MED ORDER — MIDAZOLAM HCL 2 MG/2ML IJ SOLN
INTRAMUSCULAR | Status: AC
  Filled 2019-11-26: qty 2

## 2019-11-26 MED ORDER — OXYCODONE HCL 5 MG PO TABS
ORAL_TABLET | ORAL | Status: AC
  Filled 2019-11-26: qty 1

## 2019-11-26 MED ORDER — THROMBIN 20000 UNITS EX SOLR
CUTANEOUS | Status: AC
  Filled 2019-11-26: qty 20000

## 2019-11-26 MED ORDER — PROPOFOL 10 MG/ML IV BOLUS
INTRAVENOUS | Status: AC
  Filled 2019-11-26: qty 40

## 2019-11-26 MED ORDER — PHENOL 1.4 % MT LIQD: 1.0000 | OROMUCOSAL | Status: DC | PRN

## 2019-11-26 MED ORDER — ROCURONIUM BROMIDE 10 MG/ML (PF) SYRINGE
PREFILLED_SYRINGE | INTRAVENOUS | Status: AC
  Filled 2019-11-26: qty 10

## 2019-11-26 MED ORDER — OXYCODONE HCL 5 MG/5ML PO SOLN: 5.0000 mg | Freq: Once | ORAL | Status: AC | PRN

## 2019-11-26 MED ORDER — SODIUM CHLORIDE 0.9% FLUSH
3.0000 mL | Freq: Two times a day (BID) | INTRAVENOUS | Status: DC
  Administered 2019-11-26: 3 mL via INTRAVENOUS

## 2019-11-26 MED ORDER — BUPIVACAINE HCL (PF) 0.25 % IJ SOLN
INTRAMUSCULAR | Status: DC | PRN
  Administered 2019-11-26: 10 mL

## 2019-11-26 MED ORDER — PROPOFOL 10 MG/ML IV BOLUS
INTRAVENOUS | Status: DC | PRN
  Administered 2019-11-26: 150 mg via INTRAVENOUS

## 2019-11-26 MED ORDER — ORAL CARE MOUTH RINSE: 15.0000 mL | Freq: Once | OROMUCOSAL | Status: AC

## 2019-11-26 MED ORDER — OXYCODONE HCL 5 MG PO TABS
5.0000 mg | ORAL_TABLET | Freq: Once | ORAL | Status: AC | PRN
  Administered 2019-11-26: 5 mg via ORAL

## 2019-11-26 MED ORDER — DEXAMETHASONE SODIUM PHOSPHATE 10 MG/ML IJ SOLN
INTRAMUSCULAR | Status: DC | PRN
  Administered 2019-11-26: 10 mg via INTRAVENOUS

## 2019-11-26 MED ORDER — ONDANSETRON HCL 4 MG/2ML IJ SOLN
INTRAMUSCULAR | Status: AC
  Filled 2019-11-26: qty 2

## 2019-11-26 MED ORDER — VANCOMYCIN HCL 1000 MG IV SOLR
INTRAVENOUS | Status: AC
  Filled 2019-11-26: qty 1000

## 2019-11-26 MED ORDER — ONDANSETRON HCL 4 MG/2ML IJ SOLN: 4.0000 mg | Freq: Once | INTRAMUSCULAR | Status: DC | PRN

## 2019-11-26 MED ORDER — CHLORHEXIDINE GLUCONATE 0.12 % MT SOLN
OROMUCOSAL | Status: AC
  Administered 2019-11-26: 15 mL via OROMUCOSAL
  Filled 2019-11-26: qty 15

## 2019-11-26 MED ORDER — BUPROPION HCL ER (XL) 150 MG PO TB24
150.0000 mg | ORAL_TABLET | Freq: Every day | ORAL | Status: DC
  Filled 2019-11-26: qty 1

## 2019-11-26 MED ORDER — MORPHINE SULFATE (PF) 2 MG/ML IV SOLN
2.0000 mg | INTRAVENOUS | Status: DC | PRN
  Administered 2019-11-26: 2 mg via INTRAVENOUS
  Filled 2019-11-26: qty 1

## 2019-11-26 MED ORDER — LACTATED RINGERS IV SOLN: INTRAVENOUS | Status: DC

## 2019-11-26 MED ORDER — GABAPENTIN 300 MG PO CAPS: 300.0000 mg | ORAL_CAPSULE | Freq: Every morning | ORAL | Status: DC

## 2019-11-26 MED ORDER — MIDAZOLAM HCL 2 MG/2ML IJ SOLN
INTRAMUSCULAR | Status: DC | PRN
  Administered 2019-11-26: 2 mg via INTRAVENOUS

## 2019-11-26 MED ORDER — DEXAMETHASONE 4 MG PO TABS
4.0000 mg | ORAL_TABLET | Freq: Four times a day (QID) | ORAL | Status: DC
  Administered 2019-11-27: 4 mg via ORAL
  Filled 2019-11-26: qty 1

## 2019-11-26 MED ORDER — LISINOPRIL 10 MG PO TABS: 5.0000 mg | ORAL_TABLET | Freq: Every day | ORAL | Status: DC

## 2019-11-26 MED ORDER — CEFAZOLIN SODIUM-DEXTROSE 2-4 GM/100ML-% IV SOLN
2.0000 g | Freq: Three times a day (TID) | INTRAVENOUS | Status: AC
  Administered 2019-11-26 (×2): 2 g via INTRAVENOUS
  Filled 2019-11-26 (×2): qty 100

## 2019-11-26 MED ORDER — 0.9 % SODIUM CHLORIDE (POUR BTL) OPTIME
TOPICAL | Status: DC | PRN
  Administered 2019-11-26: 1000 mL

## 2019-11-26 MED ORDER — VANCOMYCIN HCL 1000 MG IV SOLR
INTRAVENOUS | Status: DC | PRN
  Administered 2019-11-26: 1000 mg via TOPICAL

## 2019-11-26 MED ORDER — FENTANYL CITRATE (PF) 100 MCG/2ML IJ SOLN
25.0000 ug | INTRAMUSCULAR | Status: DC | PRN
  Administered 2019-11-26 (×3): 25 ug via INTRAVENOUS

## 2019-11-26 MED ORDER — FENTANYL CITRATE (PF) 100 MCG/2ML IJ SOLN
INTRAMUSCULAR | Status: AC
  Filled 2019-11-26: qty 2

## 2019-11-26 MED ORDER — POTASSIUM CHLORIDE CRYS ER 20 MEQ PO TBCR: 10.0000 meq | EXTENDED_RELEASE_TABLET | ORAL | Status: DC

## 2019-11-26 MED ORDER — CEFAZOLIN SODIUM-DEXTROSE 2-4 GM/100ML-% IV SOLN
INTRAVENOUS | Status: AC
  Filled 2019-11-26: qty 100

## 2019-11-26 MED ORDER — METHOCARBAMOL 500 MG PO TABS
500.0000 mg | ORAL_TABLET | Freq: Four times a day (QID) | ORAL | Status: DC | PRN
  Administered 2019-11-26 – 2019-11-27 (×2): 500 mg via ORAL
  Filled 2019-11-26 (×2): qty 1

## 2019-11-26 MED ORDER — ACETAMINOPHEN 650 MG RE SUPP: 650.0000 mg | RECTAL | Status: DC | PRN

## 2019-11-26 MED ORDER — ARTHREX ANGEL - ACD-A SOLUTION (CHARTING ONLY) OPTIME
TOPICAL | Status: DC | PRN
  Administered 2019-11-26: 10 mL via TOPICAL

## 2019-11-26 MED ORDER — THROMBIN 5000 UNITS EX SOLR: OROMUCOSAL | Status: DC | PRN

## 2019-11-26 MED ORDER — SODIUM CHLORIDE 0.9% FLUSH: 3.0000 mL | INTRAVENOUS | Status: DC | PRN

## 2019-11-26 MED ORDER — POTASSIUM CHLORIDE IN NACL 20-0.9 MEQ/L-% IV SOLN: INTRAVENOUS | Status: DC

## 2019-11-26 MED ORDER — FUROSEMIDE 20 MG PO TABS: 20.0000 mg | ORAL_TABLET | ORAL | Status: DC

## 2019-11-26 MED ORDER — SODIUM CHLORIDE 0.9 % IV SOLN: INTRAVENOUS | Status: DC | PRN

## 2019-11-26 MED ORDER — SERTRALINE HCL 50 MG PO TABS
200.0000 mg | ORAL_TABLET | ORAL | Status: DC
  Administered 2019-11-27: 200 mg via ORAL
  Filled 2019-11-26: qty 4

## 2019-11-26 MED ORDER — OXYCODONE HCL 5 MG PO TABS
10.0000 mg | ORAL_TABLET | ORAL | Status: DC | PRN
  Administered 2019-11-26 – 2019-11-27 (×4): 10 mg via ORAL
  Filled 2019-11-26 (×4): qty 2

## 2019-11-26 MED ORDER — BUPIVACAINE HCL (PF) 0.25 % IJ SOLN
INTRAMUSCULAR | Status: AC
  Filled 2019-11-26: qty 30

## 2019-11-26 MED ORDER — ONDANSETRON HCL 4 MG/2ML IJ SOLN: 4.0000 mg | Freq: Four times a day (QID) | INTRAMUSCULAR | Status: DC | PRN

## 2019-11-26 MED ORDER — DEXAMETHASONE SODIUM PHOSPHATE 10 MG/ML IJ SOLN
INTRAMUSCULAR | Status: AC
  Filled 2019-11-26: qty 1

## 2019-11-26 MED ORDER — ROCURONIUM BROMIDE 10 MG/ML (PF) SYRINGE
PREFILLED_SYRINGE | INTRAVENOUS | Status: DC | PRN
  Administered 2019-11-26 (×2): 50 mg via INTRAVENOUS
  Administered 2019-11-26: 30 mg via INTRAVENOUS

## 2019-11-26 MED ORDER — THROMBIN 20000 UNITS EX SOLR: CUTANEOUS | Status: DC | PRN

## 2019-11-26 MED ORDER — HEPARIN SODIUM (PORCINE) 1000 UNIT/ML IJ SOLN
INTRAMUSCULAR | Status: AC
  Filled 2019-11-26: qty 1

## 2019-11-26 MED ORDER — EPHEDRINE 5 MG/ML INJ
INTRAVENOUS | Status: AC
  Filled 2019-11-26: qty 10

## 2019-11-26 MED ORDER — FLECAINIDE ACETATE 100 MG PO TABS
100.0000 mg | ORAL_TABLET | Freq: Two times a day (BID) | ORAL | Status: DC
  Administered 2019-11-26: 100 mg via ORAL
  Filled 2019-11-26 (×2): qty 1

## 2019-11-26 MED ORDER — METHOCARBAMOL 1000 MG/10ML IJ SOLN
500.0000 mg | Freq: Four times a day (QID) | INTRAVENOUS | Status: DC | PRN
  Administered 2019-11-26: 500 mg via INTRAVENOUS
  Filled 2019-11-26 (×2): qty 5

## 2019-11-26 MED ORDER — LIDOCAINE 2% (20 MG/ML) 5 ML SYRINGE
INTRAMUSCULAR | Status: AC
  Filled 2019-11-26: qty 5

## 2019-11-26 MED ORDER — ACETAMINOPHEN 325 MG PO TABS
650.0000 mg | ORAL_TABLET | ORAL | Status: DC | PRN
  Administered 2019-11-27: 650 mg via ORAL
  Filled 2019-11-26: qty 2

## 2019-11-26 MED ORDER — METHOCARBAMOL 500 MG PO TABS
ORAL_TABLET | ORAL | Status: AC
  Filled 2019-11-26: qty 1

## 2019-11-26 MED ORDER — PHENYLEPHRINE HCL-NACL 10-0.9 MG/250ML-% IV SOLN
INTRAVENOUS | Status: DC | PRN
  Administered 2019-11-26: 30 ug/min via INTRAVENOUS

## 2019-11-26 MED ORDER — ONDANSETRON HCL 4 MG/2ML IJ SOLN
INTRAMUSCULAR | Status: DC | PRN
  Administered 2019-11-26: 4 mg via INTRAVENOUS

## 2019-11-26 MED ORDER — SUGAMMADEX SODIUM 200 MG/2ML IV SOLN
INTRAVENOUS | Status: DC | PRN
  Administered 2019-11-26: 200 mg via INTRAVENOUS

## 2019-11-26 MED ORDER — CELECOXIB 200 MG PO CAPS
200.0000 mg | ORAL_CAPSULE | Freq: Two times a day (BID) | ORAL | Status: DC
  Administered 2019-11-26 (×2): 200 mg via ORAL
  Filled 2019-11-26 (×2): qty 1

## 2019-11-26 MED ORDER — LIDOCAINE 2% (20 MG/ML) 5 ML SYRINGE
INTRAMUSCULAR | Status: DC | PRN
  Administered 2019-11-26: 60 mg via INTRAVENOUS

## 2019-11-26 MED ORDER — FENTANYL CITRATE (PF) 250 MCG/5ML IJ SOLN
INTRAMUSCULAR | Status: DC | PRN
Start: 2019-11-26 — End: 2019-11-26
  Administered 2019-11-26: 50 ug via INTRAVENOUS
  Administered 2019-11-26: 100 ug via INTRAVENOUS
  Administered 2019-11-26: 50 ug via INTRAVENOUS

## 2019-11-26 MED ORDER — DEXAMETHASONE SODIUM PHOSPHATE 4 MG/ML IJ SOLN
4.0000 mg | Freq: Four times a day (QID) | INTRAMUSCULAR | Status: DC
  Administered 2019-11-26 (×2): 4 mg via INTRAVENOUS
  Filled 2019-11-26 (×2): qty 1

## 2019-11-26 MED ORDER — METOPROLOL TARTRATE 25 MG PO TABS: 25.0000 mg | ORAL_TABLET | Freq: Every day | ORAL | Status: DC | PRN

## 2019-11-26 MED ORDER — THROMBIN 5000 UNITS EX SOLR
CUTANEOUS | Status: AC
  Filled 2019-11-26: qty 5000

## 2019-11-26 MED ORDER — ONDANSETRON HCL 4 MG PO TABS: 4.0000 mg | ORAL_TABLET | Freq: Four times a day (QID) | ORAL | Status: DC | PRN

## 2019-11-26 MED ORDER — MENTHOL 3 MG MT LOZG: 1.0000 | LOZENGE | OROMUCOSAL | Status: DC | PRN

## 2019-11-26 SURGICAL SUPPLY — 76 items
ADH SKN CLS APL DERMABOND .7 (GAUZE/BANDAGES/DRESSINGS) ×1
APL SKNCLS STERI-STRIP NONHPOA (GAUZE/BANDAGES/DRESSINGS) ×1
BASKET BONE COLLECTION (BASKET) ×2 IMPLANT
BENZOIN TINCTURE PRP APPL 2/3 (GAUZE/BANDAGES/DRESSINGS) ×2 IMPLANT
BLADE CLIPPER SURG (BLADE) IMPLANT
BONE CANC CHIPS 20CC PCAN1/4 (Bone Implant) ×2 IMPLANT
BUR CARBIDE MATCH 3.0 (BURR) ×2 IMPLANT
CANISTER SUCT 3000ML PPV (MISCELLANEOUS) ×2 IMPLANT
CHIPS CANC BONE 20CC PCAN1/4 (Bone Implant) ×1 IMPLANT
CNTNR URN SCR LID CUP LEK RST (MISCELLANEOUS) ×1 IMPLANT
CONT SPEC 4OZ STRL OR WHT (MISCELLANEOUS) ×2
COVER BACK TABLE 60X90IN (DRAPES) ×2 IMPLANT
COVER WAND RF STERILE (DRAPES) ×1 IMPLANT
DERMABOND ADVANCED (GAUZE/BANDAGES/DRESSINGS) ×1
DERMABOND ADVANCED .7 DNX12 (GAUZE/BANDAGES/DRESSINGS) ×1 IMPLANT
DIFFUSER DRILL AIR PNEUMATIC (MISCELLANEOUS) ×1 IMPLANT
DRAPE C-ARM 42X72 X-RAY (DRAPES) ×3 IMPLANT
DRAPE C-ARMOR (DRAPES) ×1 IMPLANT
DRAPE LAPAROTOMY 100X72X124 (DRAPES) ×2 IMPLANT
DRAPE SURG 17X23 STRL (DRAPES) ×2 IMPLANT
DRSG OPSITE POSTOP 4X6 (GAUZE/BANDAGES/DRESSINGS) ×1 IMPLANT
DURAPREP 26ML APPLICATOR (WOUND CARE) ×2 IMPLANT
ELECT REM PT RETURN 9FT ADLT (ELECTROSURGICAL) ×2
ELECTRODE REM PT RTRN 9FT ADLT (ELECTROSURGICAL) ×1 IMPLANT
EVACUATOR 1/8 PVC DRAIN (DRAIN) ×1 IMPLANT
GAUZE 4X4 16PLY RFD (DISPOSABLE) IMPLANT
GLOVE BIO SURGEON STRL SZ 6.5 (GLOVE) ×2 IMPLANT
GLOVE BIO SURGEON STRL SZ7 (GLOVE) ×1 IMPLANT
GLOVE BIO SURGEON STRL SZ8 (GLOVE) ×3 IMPLANT
GLOVE BIOGEL PI IND STRL 6.5 (GLOVE) IMPLANT
GLOVE BIOGEL PI IND STRL 7.0 (GLOVE) IMPLANT
GLOVE BIOGEL PI IND STRL 7.5 (GLOVE) IMPLANT
GLOVE BIOGEL PI INDICATOR 6.5 (GLOVE) ×1
GLOVE BIOGEL PI INDICATOR 7.0 (GLOVE) ×1
GLOVE BIOGEL PI INDICATOR 7.5 (GLOVE) ×5
GLOVE SURG SS PI 7.0 STRL IVOR (GLOVE) ×5 IMPLANT
GOWN STRL REUS W/ TWL LRG LVL3 (GOWN DISPOSABLE) IMPLANT
GOWN STRL REUS W/ TWL XL LVL3 (GOWN DISPOSABLE) ×2 IMPLANT
GOWN STRL REUS W/TWL 2XL LVL3 (GOWN DISPOSABLE) IMPLANT
GOWN STRL REUS W/TWL LRG LVL3 (GOWN DISPOSABLE) ×4
GOWN STRL REUS W/TWL XL LVL3 (GOWN DISPOSABLE) ×8
GRAFT BNE CANC CHIPS 1-8 20CC (Bone Implant) IMPLANT
HEMOSTAT POWDER KIT SURGIFOAM (HEMOSTASIS) ×1 IMPLANT
KIT BASIN OR (CUSTOM PROCEDURE TRAY) ×2 IMPLANT
KIT BONE MRW ASP ANGEL CPRP (KITS) ×1 IMPLANT
KIT TURNOVER KIT B (KITS) ×2 IMPLANT
MILL MEDIUM DISP (BLADE) ×1 IMPLANT
NDL HYPO 18GX1.5 BLUNT FILL (NEEDLE) IMPLANT
NDL HYPO 25X1 1.5 SAFETY (NEEDLE) ×1 IMPLANT
NEEDLE HYPO 18GX1.5 BLUNT FILL (NEEDLE) ×4 IMPLANT
NEEDLE HYPO 25X1 1.5 SAFETY (NEEDLE) ×2 IMPLANT
NS IRRIG 1000ML POUR BTL (IV SOLUTION) ×2 IMPLANT
PACK LAMINECTOMY NEURO (CUSTOM PROCEDURE TRAY) ×2 IMPLANT
PAD ARMBOARD 7.5X6 YLW CONV (MISCELLANEOUS) ×6 IMPLANT
PUTTY DBM ALLOSYNC PURE 10CC (Putty) ×1 IMPLANT
ROD LORD LIPPED TI 5.5X90 (Rod) ×2 IMPLANT
SCREW CANC SHANK MOD 5.5X45 (Screw) ×4 IMPLANT
SCREW POLYAXIAL TULIP (Screw) ×4 IMPLANT
SCREW SET SPINAL ARSENAL 47127 (Screw) ×4 IMPLANT
SET SCREW (Screw) ×8 IMPLANT
SET SCREW SPNE (Screw) IMPLANT
SPACER BATTALION 5 .25X10MM 9 (Spacer) ×3 IMPLANT
SPACER PEEK PS 25X9MM 9MM 5DEG (Spacer) ×1 IMPLANT
SPONGE LAP 4X18 RFD (DISPOSABLE) IMPLANT
SPONGE SURGIFOAM ABS GEL 100 (HEMOSTASIS) ×2 IMPLANT
STRIP CLOSURE SKIN 1/2X4 (GAUZE/BANDAGES/DRESSINGS) ×3 IMPLANT
SUT VIC AB 0 CT1 18XCR BRD8 (SUTURE) ×1 IMPLANT
SUT VIC AB 0 CT1 8-18 (SUTURE) ×2
SUT VIC AB 2-0 CP2 18 (SUTURE) ×2 IMPLANT
SUT VIC AB 3-0 SH 8-18 (SUTURE) ×4 IMPLANT
SYR 20CC LL (SYRINGE) ×1 IMPLANT
SYR CONTROL 10ML LL (SYRINGE) ×1 IMPLANT
TOWEL GREEN STERILE (TOWEL DISPOSABLE) ×2 IMPLANT
TOWEL GREEN STERILE FF (TOWEL DISPOSABLE) ×2 IMPLANT
TRAY FOLEY MTR SLVR 16FR STAT (SET/KITS/TRAYS/PACK) ×2 IMPLANT
WATER STERILE IRR 1000ML POUR (IV SOLUTION) ×2 IMPLANT

## 2019-11-26 NOTE — Progress Notes (Signed)
Patient ID: Kiara [name], female   DOB: [birth year], 59 y.o.   MRN: [id number] Looks great, no leg pain, back sore, good strength

## 2019-11-26 NOTE — Progress Notes (Signed)
Contacted Dr. Yetta Barre to clarify which type of back brace patient needs. She states she has not received a back brace. Dr. Yetta Barre stated he wanted to look into that prior to ordering a new one for her. Ellan Lambert, RN made aware upon transfer of patient from PACU to [facility]

## 2019-11-26 NOTE — Op Note (Signed)
11/26/2019  12:43 PM  PATIENT:  Kiara [name]  59 y.o. female  PRE-OPERATIVE DIAGNOSIS: Adjacent level spondylosis with stenosis L2-3 L3-4, rotary dextroscoliosis with loss of sagittal and coronal balance, back and right leg pain  POST-OPERATIVE DIAGNOSIS:  same  PROCEDURE:   1. Decompressive lumbar laminectomy hemifacetectomy and foraminotomies L2-3 and L3-4 with [facility] requiring more work than would be required for a simple exposure of the disk for PLIF in order to adequately decompress the neural elements and address the spinal stenosis and correct deformity 2. Posterior lumbar interbody fusion L2-3 and L3-4 using peek interbody cages packed with morcellized allograft and autograft soaked with a bone aspirate obtained through a separate fascial incision over the left iliac crest 3. Posterior fixation L2-L5 inclusive using Alphatec cortical pedicle screws.  4. Intertransverse arthrodesis L2-L4 using morcellized autograft and allograft. 5.  Removal of nonsegmental instrumentation L4-5  SURGEON:  Marikay Alar, MD  ASSISTANTS: Dr. Coletta Memos  ANESTHESIA:  General  EBL: 350 ml  Total I/O In: 1000 [I.V.:1000] Out: 750 [Urine:400; Blood:350]  BLOOD ADMINISTERED:100 CC CELLSAVER  DRAINS: none   INDICATION FOR PROCEDURE: This patient presented with severe back pain with right leg pain. Imaging revealed dextroscoliosis with stenosis especially right foraminal stenosis above her previous 4 5 fusion. The patient tried a reasonable attempt at conservative medical measures without relief. I recommended decompression and instrumented fusion to address the stenosis as well as the spinal deformity.  Patient understood the risks, benefits, and alternatives and potential outcomes and wished to proceed.  PROCEDURE DETAILS:  The patient was brought to the operating room. After induction of generalized endotracheal anesthesia the patient was rolled into the prone position on chest  rolls and all pressure points were padded. The patient's lumbar region was cleaned and then prepped with DuraPrep and draped in the usual sterile fashion. Anesthesia was injected and then a dorsal midline incision was made and carried down to the lumbosacral fascia. The fascia was opened and the paraspinous musculature was taken down in a subperiosteal fashion to expose L2-3 L3-4 and the previously placed instrumentation at L4-5.  We remove the locking caps and the rods from the L4-5 screws.  The screws had good purchase.  A self-retaining retractor was placed. Intraoperative fluoroscopy confirmed my level, and I started with placement of the L2 cortical pedicle screws. The pedicle screw entry zones were identified utilizing surface landmarks and  AP and lateral fluoroscopy. I scored the cortex with the high-speed drill and then used the hand drill to drill an upward and outward direction into the pedicle. I then tapped line to line. I then placed a 5.5 x 45 mm cortical pedicle screw into the pedicles of L2 bilaterally.  I then dissected in a suprafascial plane to expose the iliac crest.  Opened the fascia and we used a Jamshidi needle to extract 60 cc of bone marrow aspirate from the iliac crest with the assistance of my nurse practitioner.  This was then spun down by [facility] device and 2 to 4 cc of  BMAC was soaked on morselized allograft for later arthrodesis.  I dried the hole with Surgifoam and closed the fascia.  I then turned my attention to the Smith-Robinson PSOs and the decompression and complete lumbar laminectomies, hemi- facetectomies, and foraminotomies were performed at L2-3 and L3-4.  All local autograft was saved for later arthrodesis.  My nurse practitioner was directly involved in the decompression and exposure of the neural elements. the patient had  significant spinal stenosis and this required more work than would be required for a simple exposure of the disc for posterior lumbar interbody fusion  which would only require a limited laminotomy. Much more generous decompression and generous foraminotomy was undertaken in order to adequately decompress the neural elements and address the patient's leg pain, especially on the right where she had severe foraminal stenosis. The yellow ligament was removed to expose the underlying dura and nerve roots, and generous foraminotomies were performed to adequately decompress the neural elements. Both the exiting and traversing nerve roots were decompressed on both sides until a coronary dilator passed easily along the nerve roots. Once the decompression was complete, I turned my attention to the posterior lower lumbar interbody fusion. The epidural venous vasculature was coagulated and cut sharply. Disc space was incised and the initial discectomy was performed with pituitary rongeurs. The disc space was distracted with sequential distractors to a height of 10 mm at both levels. We then used a series of scrapers and shavers to prepare the endplates for fusion. The midline was prepared with Epstein curettes. Once the complete discectomy was finished, we packed an appropriate sized 10 mm peek interbody cage with local autograft and morcellized allograft, gently retracted the nerve root, and tapped the cage into position at L2-3 and L3-4.  The midline between the cages was packed with morselized autograft and allograft. We then turned our attention to the placement of the L3 pedicle screws. The pedicle screw entry zones were identified utilizing surface landmarks and fluoroscopy. I drilled into each pedicle utilizing the hand drill, and tapped each pedicle with the appropriate tap. We palpated with a ball probe to assure no break in the cortex. We then placed 5.5 x 45 mm pedicle screws into the pedicles bilaterally at L3.  My nurse practitioner assisted in placement of the pedicle screws.  We then decorticated the transverse processes and laid a mixture of morcellized  autograft and allograft out over these to perform intertransverse arthrodesis at L2-L4. We then placed lordotic rods into the multiaxial screw heads of the pedicle screws from L2-L5 and locked these in position with the locking caps and anti-torque device.  We achieved compression of her grafts and L2-3 and L3-4 on the left in order to help improve lordosis but also improve coronal balance. we then checked our construct with AP and lateral fluoroscopy.  We felt we had achieved some correction of both her sagittal and coronal balance on the AP and lateral fluoroscopic images.  Irrigated with copious amounts of bacitracin-containing saline solution. Inspected the nerve roots once again to assure adequate decompression, lined to the dura with Gelfoam, placed powdered vancomycin into the wound, and then we closed the muscle and the fascia with 0 Vicryl. Closed the subcutaneous tissues with 2-0 Vicryl and subcuticular tissues with 3-0 Vicryl. The skin was closed with benzoin and Steri-Strips. Dressing was then applied, the patient was awakened from general anesthesia and transported to the recovery room in stable condition. At the end of the procedure all sponge, needle and instrument counts were correct.   PLAN OF CARE: admit to inpatient  PATIENT DISPOSITION:  PACU - hemodynamically stable.   Delay start of Pharmacological VTE agent (>24hrs) due to surgical blood loss or risk of bleeding:  yes

## 2019-11-26 NOTE — Anesthesia Postprocedure Evaluation (Signed)
Anesthesia Post Note  Patient: Sharlot Gowda  Procedure(s) Performed: Posterior Lumbar Interbody Fusion - Lumbar two-three, Lumbar three-four with extension of hardware (N/A Back)     Patient location during evaluation: PACU Anesthesia Type: General Level of consciousness: awake and alert Pain management: pain level controlled Vital Signs Assessment: post-procedure vital signs reviewed and stable Respiratory status: spontaneous breathing, nonlabored ventilation, respiratory function stable and patient connected to nasal cannula oxygen Cardiovascular status: blood pressure returned to baseline and stable Postop Assessment: no apparent nausea or vomiting Anesthetic complications: no   No complications documented.  Last Vitals:  Vitals:   11/26/19 1351 11/26/19 1443  BP: 110/68 104/64  Pulse: (!) 55 60  Resp: 14 16  Temp: 36.9 C 36.7 C  SpO2: 96% 97%    Last Pain:  Vitals:   11/26/19 1443  TempSrc: Oral  PainSc:                  Meric Joye COKER

## 2019-11-26 NOTE — Anesthesia Procedure Notes (Signed)
Procedure Name: Intubation Date/Time: 11/26/2019 8:48 AM Performed by: Valda Favia, CRNA Pre-anesthesia Checklist: Patient identified, Emergency Drugs available, Suction available and Patient being monitored Patient Re-evaluated:Patient Re-evaluated prior to induction Oxygen Delivery Method: Circle System Utilized Preoxygenation: Pre-oxygenation with 100% oxygen Induction Type: IV induction Ventilation: Mask ventilation without difficulty Laryngoscope Size: Mac and 4 Grade View: Grade I Tube type: Oral Tube size: 7.0 mm Number of attempts: 1 Airway Equipment and Method: Stylet and Oral airway Placement Confirmation: ETT inserted through vocal cords under direct vision,  positive ETCO2 and breath sounds checked- equal and bilateral Secured at: 20 cm Tube secured with: Tape Dental Injury: Teeth and Oropharynx as per pre-operative assessment

## 2019-11-26 NOTE — Progress Notes (Signed)
Orthopedic Tech Progress Note Patient Details:  Kiara [name] Oct 15, 1960 [id number] Called in order to [facility] for a LSO  Patient ID: Kiara [name], female   DOB: August 20, 1960, 59 y.o.   MRN: [id number]   [name] 11/26/2019, 4:42 PM

## 2019-11-26 NOTE — H&P (Signed)
Subjective: Patient is a 59 y.o. female admitted for scoliosis. Onset of symptoms was several months ago, gradually worsening since that time.  The pain is rated severe, and is located at the across the lower back and radiates to RLE. The pain is described as aching and occurs all day. The symptoms have been progressive. Symptoms are exacerbated by exercise. MRI or CT showed scoliosis with stenosis above previous L4-5 fusion   Past Medical History:  Diagnosis Date  . Anxiety   . Asthma   . Atrial fibrillation (HCC)    a. admx with AFib with RVR and acute systolic CHF 07/2013; TEE with LAA clot and no DCCV done;    . CHF (congestive heart failure) (HCC) 2015  . Chronic systolic heart failure (HCC)   . Coronary artery disease    a.  LHC (07/17/13):  LM 20%, ostial CFX 20-30%  . Depression   . Dysrhythmia 2015   A fib  . History of kidney stones   . Hyperlipemia   . Hypothyroidism    past hx  . Nephrolithiasis 2013   "passed them"  . NICM (nonischemic cardiomyopathy) (HCC)    a. Echo (07/17/13):  EF 25-30%, diff HK, basal and mid anteroseptum/ basal inferoseptum and apical AK, trivial AI, mod MR, mod LAE, mild reduced RVSF, mild RAE  . Obesity   . Osteopenia     Past Surgical History:  Procedure Laterality Date  . ABDOMINAL HYSTERECTOMY  ~ 2001  . ABLATION  10/28/13   PVI by Dr Johney Frame  . APPENDECTOMY  1978  . ATRIAL FIBRILLATION ABLATION N/A 10/28/2013   Procedure: ATRIAL FIBRILLATION ABLATION;  Surgeon: Gardiner Rhyme, MD;  Location: [facility] CATH LAB;  Service: Cardiovascular;  Laterality: N/A;  . BACK SURGERY Bilateral 2019   Fusion  . CARDIAC CATHETERIZATION  07/2013  . CARDIOVERSION N/A 08/18/2013   Procedure: CARDIOVERSION;  Surgeon: Donato Schultz, MD;  Location: [facility] ENDOSCOPY;  Service: Cardiovascular;  Laterality: N/A;  . CARDIOVERSION N/A 08/29/2013   Procedure: CARDIOVERSION;  Surgeon: Pricilla Riffle, MD;  Location: [facility] OR;  Service: Cardiovascular;  Laterality: N/A;  . ELECTROPHYSIOLOGIC  STUDY N/A 11/05/2014   Procedure: Atrial Fibrillation Ablation;  Surgeon: Hillis Range, MD;  Location: [name] INVASIVE CV LAB;  Service: Cardiovascular;  Laterality: N/A;  . EYE SURGERY Bilateral 1999   Corrective laser surgery  . implantable loop recorder removal  10/02/2018   MDT Reveal LINQ removed in office by Dr Johney Frame for EOL battery  . IR SACROPLASTY BILATERAL  02/06/2019  . LEFT HEART CATHETERIZATION WITH CORONARY ANGIOGRAM N/A 07/17/2013   Procedure: LEFT HEART CATHETERIZATION WITH CORONARY ANGIOGRAM;  Surgeon: Micheline Chapman, MD;  Location[facility] C[facility] LAB;  Service: Cardiovascular;  Laterality: N/A;  . LOOP RECORDER IMPLANT N/A 06/05/2014   Procedure: LOOP RECORDER IMPLANT;  Surgeon: Hillis Range, MD;  Location: [facility] CATH LAB;  Service: Cardiovascular;  Laterality: N/A;  . TEE WITHOUT CARDIOVERSION N/A 07/18/2013   Procedure: TRANSESOPHAGEAL ECHOCARDIOGRAM (TEE);  Surgeon: Lewayne Bunting, MD;  Location: [facility] ENDOSCOPY;  Service: Cardiovascular;  Laterality: N/A;  . TEE WITHOUT CARDIOVERSION N/A 08/18/2013   Procedure: TRANSESOPHAGEAL ECHOCARDIOGRAM (TEE);  Surgeon: Donato Schultz, MD;  Location: [facility] ENDOSCOPY;  Service: Cardiovascular;  Laterality: N/A;  . TEE WITHOUT CARDIOVERSION N/A 10/27/2013   Procedure: TRANSESOPHAGEAL ECHOCARDIOGRAM (TEE);  Surgeon: Pricilla Riffle, MD;  Location: [facility] ENDOSCOPY;  Service: Cardiovascular;  Laterality: N/A;  . TEE WITHOUT CARDIOVERSION N/A 11/04/2014   Procedure: TRANSESOPHAGEAL ECHOCARDIOGRAM (TEE);  Surgeon: Lisette Abu  Hilty, MD;  Location: [facility] ENDOSCOPY;  Service: Cardiovascular;  Laterality: N/A;    Prior to Admission medications   Medication Sig Start Date End Date Taking? Authorizing Provider  acetaminophen (TYLENOL) 500 MG tablet Take 1,000 mg by mouth every 6 (six) hours as needed for mild pain or headache.    Yes [provider]  atorvastatin (LIPITOR) 40 MG tablet TAKE 1 TABLET BY MOUTH EVERY DAY AT 6PM Patient taking differently: Take 40 mg by mouth  every evening.  09/11/19  Yes Allred, Fayrene Fearing, MD  buPROPion (WELLBUTRIN XL) 150 MG 24 hr tablet Take 150 mg by mouth daily. 09/09/15  Yes [provider]  ELIQUIS 5 MG TABS tablet TAKE 1 TABLET BY MOUTH TWICE A DAY Patient taking differently: Take 5 mg by mouth 2 (two) times daily.  03/24/19  Yes Allred, Fayrene Fearing, MD  flecainide (TAMBOCOR) 100 MG tablet TAKE 1 TABLET BY MOUTH TWICE A DAY 11/19/19  Yes Allred, Fayrene Fearing, MD  furosemide (LASIX) 20 MG tablet TAKE 1 TABLET BY MOUTH EVERY OTHER DAY Patient taking differently: Take 20 mg by mouth every other day.  06/26/19  Yes Allred, Fayrene Fearing, MD  gabapentin (NEURONTIN) 300 MG capsule Take 300-600 mg by mouth See admin instructions. Take 300 mg by mouth in the morning and 600 mg at bedtime   Yes [provider]  lisinopril (ZESTRIL) 5 MG tablet TAKE 1 TABLET BY MOUTH EVERY DAY 11/20/19  Yes Allred, Fayrene Fearing, MD  metoprolol tartrate (LOPRESSOR) 25 MG tablet TAKE 0.5 TABLETS (12.5 MG TOTAL) BY MOUTH AS DIRECTED. Patient taking differently: Take 25 mg by mouth daily as needed (A-FIB).  06/16/19  Yes Allred, Fayrene Fearing, MD  potassium chloride (K-DUR) 10 MEQ tablet Take 1 tablet (10 mEq total) by mouth every other day. When taking Lasix 10/02/18  Yes Allred, Fayrene Fearing, MD  sertraline (ZOLOFT) 100 MG tablet Take 200 mg by mouth every morning.  07/04/13  Yes [provider]  tiZANidine (ZANAFLEX) 4 MG tablet Take 4 mg by mouth at bedtime. Patient not taking: Reported on 11/13/2019    [provider]  traMADol (ULTRAM) 50 MG tablet Take 50 mg by mouth every 8 (eight) hours as needed for moderate pain. Patient not taking: Reported on 11/13/2019    [provider]   Allergies  Allergen Reactions  . Azithromycin Diarrhea and Nausea And Vomiting  . Tape Rash    Social History   Tobacco Use  . Smoking status: Never Smoker  . Smokeless tobacco: Never Used  Substance Use Topics  . Alcohol use: Yes    Comment: occasional    Family History   Problem Relation Age of Onset  . Hypertension Mother   . Lymphoma Mother   . Hypertension Father      Review of Systems  Positive ROS: neg  All other systems have been reviewed and were otherwise negative with the exception of those mentioned in the HPI and as above.  Objective: Vital signs in last 24 hours: Temp:  [98.2 F (36.8 C)] 98.2 F (36.8 C) (10/13 0704) Pulse Rate:  [53] 53 (10/13 0704) Resp:  [17] 17 (10/13 0704) BP: (130)/(68) 130/68 (10/13 0704) SpO2:  [96 %] 96 % (10/13 0704) Weight:  [93.2 kg] 93.2 kg (10/13 0704)  General Appearance: Alert, cooperative, no distress, appears stated age Head: Normocephalic, without obvious abnormality, atraumatic Eyes: PERRL, conjunctiva/corneas clear, EOM's intact    Neck: Supple, symmetrical, trachea midline Back: Symmetric, no curvature, ROM normal, no CVA tenderness Lungs:  respirations  unlabored Heart: Regular rate and rhythm Abdomen: Soft, non-tender Extremities: Extremities normal, atraumatic, no cyanosis or edema Pulses: 2+ and symmetric all extremities Skin: Skin color, texture, turgor normal, no rashes or lesions  NEUROLOGIC:   Mental status: Alert and oriented x4,  no aphasia, good attention span, fund of knowledge, and memory Motor Exam - grossly normal Sensory Exam - grossly normal Reflexes: 1+ Coordination - grossly normal Gait - grossly normal Balance - grossly normal Cranial Nerves: I: smell Not tested  II: visual acuity  OS: nl    OD: nl  II: visual fields Full to confrontation  II: pupils Equal, round, reactive to light  III,VII: ptosis None  III,IV,VI: extraocular muscles  Full ROM  V: mastication Normal  V: facial light touch sensation  Normal  V,VII: corneal reflex  Present  VII: facial muscle function - upper  Normal  VII: facial muscle function - lower Normal  VIII: hearing Not tested  IX: soft palate elevation  Normal  IX,X: gag reflex Present  XI: trapezius strength  5/5  XI:  sternocleidomastoid strength 5/5  XI: neck flexion strength  5/5  XII: tongue strength  Normal    Data Review Lab Results  Component Value Date   WBC 7.5 11/24/2019   HGB 14.0 11/24/2019   HCT 43.8 11/24/2019   MCV 93.0 11/24/2019   PLT 183 11/24/2019   Lab Results  Component Value Date   NA 142 11/24/2019   K 4.1 11/24/2019   CL 107 11/24/2019   CO2 26 11/24/2019   BUN 10 11/24/2019   CREATININE 0.84 11/24/2019   GLUCOSE 111 (H) 11/24/2019   Lab Results  Component Value Date   INR 0.9 02/06/2019    Assessment/Plan:  Estimated body mass index is 33.17 kg/m as calculated from the following:   Height as of this encounter: 5\' 6"  (1.676 m).   Weight as of this encounter: 93.2 kg. Patient admitted for PLIF L2-3 L3-4. Patient has failed a reasonable attempt at conservative therapy.  I explained the condition and procedure to the patient and answered any questions.  Patient wishes to proceed with procedure as planned. Understands risks/ benefits and typical outcomes of procedure.   11/26/2019 8:19 AM

## 2019-11-26 NOTE — Transfer of Care (Signed)
Immediate Anesthesia Transfer of Care Note  Patient: Kiara [name]  Procedure(s) Performed: Posterior Lumbar Interbody Fusion - Lumbar two-three, Lumbar three-four with extension of hardware (N/A Back)  Patient Location: PACU  Anesthesia Type:General  Level of Consciousness: awake, alert  and oriented  Airway & Oxygen Therapy: Patient Spontanous Breathing and Patient connected to nasal cannula oxygen  Post-op Assessment: Report given to RN and Post -op Vital signs reviewed and stable  Post vital signs: Reviewed and stable  Last Vitals:  Vitals Value Taken Time  BP 115/60 11/26/19 1252  Temp    Pulse 56 11/26/19 1256  Resp 16 11/26/19 1256  SpO2 97 % 11/26/19 1256  Vitals shown include unvalidated device data.  Last Pain:  Vitals:   11/26/19 1245  TempSrc:   PainSc: 9       Patients Stated Pain Goal: 2 (11/26/19 0704)  Complications: No complications documented.

## 2019-11-27 MED ORDER — METHOCARBAMOL 500 MG PO TABS: 500.0000 mg | ORAL_TABLET | Freq: Three times a day (TID) | ORAL | 1 refills | Status: DC | PRN

## 2019-11-27 MED ORDER — OXYCODONE HCL 10 MG PO TABS
10.0000 mg | ORAL_TABLET | Freq: Four times a day (QID) | ORAL | 0 refills | Status: DC | PRN
Start: 2019-11-27 — End: 2020-01-01

## 2019-11-27 MED ORDER — CELECOXIB 200 MG PO CAPS
200.0000 mg | ORAL_CAPSULE | Freq: Two times a day (BID) | ORAL | 0 refills | Status: DC
Start: 2019-11-27 — End: 2020-01-01

## 2019-11-27 MED FILL — Sodium Chloride IV Soln 0.9%: INTRAVENOUS | Qty: 1000 | Status: AC

## 2019-11-27 MED FILL — Heparin Sodium (Porcine) Inj 1000 Unit/ML: INTRAMUSCULAR | Qty: 30 | Status: AC

## 2019-11-27 NOTE — Progress Notes (Signed)
PT Cancellation Note  Patient Details Name: Kiara [name] MRN: [id number] DOB: 06/18/1960   Cancelled Treatment:    Reason Eval/Treat Not Completed: PT screened, no needs identified, will sign off. Per the OT, the pt stated she feels confident with her mobility without needing PT at this time, and after discussion with OT about the pt's mobility and home set-up she will be safe to return home with family assist until her follow-up. Thank you for the consult, please feel free to re-consult if change in needs.   Deland Pretty, DPT   Acute Rehabilitation Department Pager #: [phone]   Gaetana Michaelis 11/27/2019, 8:32 AM

## 2019-11-27 NOTE — Progress Notes (Signed)
Patient alert and oriented, mae's well, voiding adequate amount of urine, swallowing without difficulty, no c/o pain at time of discharge. Patient discharged home with family. Script and discharged instructions given to patient. Patient and family stated understanding of instructions given. Patient has an appointment with Dr. Jones °

## 2019-11-27 NOTE — Discharge Instructions (Signed)
Wound Care Keep incision covered and dry for 3 days.  Do not put any creams, lotions, or ointments on incision. Leave steri-strips on back.  They will fall off by themselves. Activity Walk each and every day, increasing distance each day. No lifting greater than 5 lbs.  Avoid excessive back bending No driving for 2 weeks; may ride as a passenger locally. If provided with back brace, wear when out of bed.  It is not necessary to wear brace in bed. Diet Resume your normal diet.  Return to Work Will be discussed at you follow up appointment. Call Your Doctor If Any of These Occur Redness, drainage, or swelling at the wound.  Temperature greater than 101 degrees. Severe pain not relieved by pain medication. Incision starts to come apart. Follow Up Appt Call today for appointment in 1-2 weeks ([phone]) or for problems.  If you have any hardware placed in your spine, you will need an x-ray before your appointment.

## 2019-11-27 NOTE — Discharge Summary (Signed)
Physician Discharge Summary  Patient ID: Kiara [name] MRN: [id number] DOB/AGE: 1960/06/15 59 y.o.  Admit date: 11/26/2019 Discharge date: 11/27/2019  Admission Diagnoses: scoliosis with stenosis    Discharge Diagnoses: same   Discharged Condition: good  Hospital Course: The patient was admitted on 11/26/2019 and taken to the operating room where the patient underwent PLIF L2-3 L3-4. The patient tolerated the procedure well and was taken to the recovery room and then to the floor in stable condition. The hospital course was routine. There were no complications. The wound remained clean dry and intact. Pt had appropriate back soreness. No complaints of  leg pain or new N/T/W. The patient remained afebrile with stable vital signs, and tolerated a regular diet. The patient continued to increase activities, and pain was well controlled with oral pain medications.   Consults: None  Significant Diagnostic Studies:  Results for orders placed or performed during the hospital encounter of 11/26/19  ABO/Rh  Result Value Ref Range   ABO/RH(D)      O POS Performed at [facility] Lab, [address] [place], [place] [address]     DG Lumbar Spine 2-3 Views  Result Date: 11/26/2019 CLINICAL DATA:  Surgical posterior fusion of lower lumbar spine. EXAM: DG C-ARM 1-60 MIN; LUMBAR SPINE - 2-3 VIEW CONTRAST:  None. FLUOROSCOPY TIME:  Radiation Exposure Index (if provided by the fluoroscopic device): 49.51 mGy. Number of Acquired Spot Images: 2. COMPARISON:  July 18, 2019. FINDINGS: Two intraoperative fluoroscopic images demonstrate the patient be status post interval surgical posterior fusion of L2-3 and L3-4 with bilateral intrapedicular screw placement and interbody fusion. IMPRESSION: Status post surgical posterior fusion of L2-3 and L3-4. Electronically Signed   By: Lupita Raider M.D.   On: 11/26/2019 14:13   DG C-Arm 1-60 Min  Result Date: 11/26/2019 CLINICAL DATA:  Surgical posterior  fusion of lower lumbar spine. EXAM: DG C-ARM 1-60 MIN; LUMBAR SPINE - 2-3 VIEW CONTRAST:  None. FLUOROSCOPY TIME:  Radiation Exposure Index (if provided by the fluoroscopic device): 49.51 mGy. Number of Acquired Spot Images: 2. COMPARISON:  July 18, 2019. FINDINGS: Two intraoperative fluoroscopic images demonstrate the patient be status post interval surgical posterior fusion of L2-3 and L3-4 with bilateral intrapedicular screw placement and interbody fusion. IMPRESSION: Status post surgical posterior fusion of L2-3 and L3-4. Electronically Signed   By: Lupita Raider M.D.   On: 11/26/2019 14:13    Antibiotics:  Anti-infectives (From admission, onward)   Start     Dose/Rate Route Frequency Ordered Stop   11/26/19 1600  ceFAZolin (ANCEF) IVPB 2g/100 mL premix        2 g 200 mL/hr over 30 Minutes Intravenous Every 8 hours 11/26/19 1423 11/26/19 2358   11/26/19 1223  vancomycin (VANCOCIN) powder  Status:  Discontinued          As needed 11/26/19 1223 11/26/19 1246   11/26/19 0808  ceFAZolin (ANCEF) 2-4 GM/100ML-% IVPB       Note to Pharmacy: Irean Hong   : cabinet override      11/26/19 0808 11/26/19 2014      Discharge Exam: Blood pressure (!) 104/45, pulse (!) 52, temperature 97.6 F (36.4 C), temperature source Oral, resp. rate 18, height 5\' 6"  (1.676 m), weight 93.2 kg, SpO2 94 %. Neurologic: Grossly normal Dressing dry  Discharge Medications:   Allergies as of 11/27/2019      Reactions   Azithromycin Diarrhea, Nausea And Vomiting   Tape Rash  Medication List    TAKE these medications   acetaminophen 500 MG tablet Commonly known as: TYLENOL Take 1,000 mg by mouth every 6 (six) hours as needed for mild pain or headache.   atorvastatin 40 MG tablet Commonly known as: LIPITOR TAKE 1 TABLET BY MOUTH EVERY DAY AT 6PM What changed: See the new instructions.   buPROPion 150 MG 24 hr tablet Commonly known as: WELLBUTRIN XL Take 150 mg by mouth daily.   celecoxib 200 MG  capsule Commonly known as: CELEBREX Take 1 capsule (200 mg total) by mouth 2 (two) times daily.   Eliquis 5 MG Tabs tablet Generic drug: apixaban TAKE 1 TABLET BY MOUTH TWICE A DAY What changed: how much to take   flecainide 100 MG tablet Commonly known as: TAMBOCOR TAKE 1 TABLET BY MOUTH TWICE A DAY   furosemide 20 MG tablet Commonly known as: LASIX TAKE 1 TABLET BY MOUTH EVERY OTHER DAY   gabapentin 300 MG capsule Commonly known as: NEURONTIN Take 300-600 mg by mouth See admin instructions. Take 300 mg by mouth in the morning and 600 mg at bedtime   lisinopril 5 MG tablet Commonly known as: ZESTRIL TAKE 1 TABLET BY MOUTH EVERY DAY   methocarbamol 500 MG tablet Commonly known as: ROBAXIN Take 1 tablet (500 mg total) by mouth every 8 (eight) hours as needed for muscle spasms.   metoprolol tartrate 25 MG tablet Commonly known as: LOPRESSOR TAKE 0.5 TABLETS (12.5 MG TOTAL) BY MOUTH AS DIRECTED. What changed:   how much to take  when to take this  reasons to take this   Oxycodone HCl 10 MG Tabs Take 1 tablet (10 mg total) by mouth every 6 (six) hours as needed for severe pain ((score 7 to 10)).   potassium chloride 10 MEQ tablet Commonly known as: KLOR-CON Take 1 tablet (10 mEq total) by mouth every other day. When taking Lasix   sertraline 100 MG tablet Commonly known as: ZOLOFT Take 200 mg by mouth every morning.            Durable Medical Equipment  (From admission, onward)         Start     Ordered   11/26/19 1424  DME Walker rolling  Once       Question:  Patient needs a walker to treat with the following condition  Answer:  S/P lumbar fusion   11/26/19 1423   11/26/19 1424  DME 3 n 1  Once        11/26/19 1423          Disposition: home   Final Dx: PLIF L2-3 L3-4  Discharge Instructions     Remove dressing in 72 hours   Complete by: As directed    Call MD for:  difficulty breathing, headache or visual disturbances   Complete by: As  directed    Call MD for:  persistant nausea and vomiting   Complete by: As directed    Call MD for:  redness, tenderness, or signs of infection (pain, swelling, redness, odor or green/yellow discharge around incision site)   Complete by: As directed    Call MD for:  severe uncontrolled pain   Complete by: As directed    Call MD for:  temperature >100.4   Complete by: As directed    Diet - low sodium heart healthy   Complete by: As directed    Increase activity slowly   Complete by: As directed  Follow-up Information    Tia Alert, MD. Schedule an appointment as soon as possible for a visit in 2 week(s).   Specialty: Neurosurgery Contact information: [address] [place] [address] [phone]                Signed: Tia Alert 11/27/2019, 7:41 AM

## 2019-11-27 NOTE — Evaluation (Addendum)
Occupational Therapy Evaluation and Discharge Patient Details Name: Kiara [name] MRN: [id number] DOB: 1960/08/22 Today's Date: 11/27/2019    History of Present Illness Decompressive lumbar laminectomy hemifacetectomy and foraminotomies L2-3 and L3-4,Posterior lumbar interbody fusion L2-3 and L3-4,Posterior fixation L2-L5,Removal of nonsegmental instrumentation L4-5   Clinical Impression   This 59 yo female admitted with above presents to acute OT with all education completed, we will D/C from acute OT.Pt reports she does not feel she needs PT services acutely--made PT aware.     Follow Up Recommendations  No OT follow up;Supervision - Intermittent    Equipment Recommendations  None recommended by OT       Precautions / Restrictions Precautions Precautions: Back Precaution Booklet Issued: Yes (comment) Precaution Comments: Can have off for trips to bathroom and not sleep in it Required Braces or Orthoses: Spinal Brace Spinal Brace: Applied in sitting position Restrictions Weight Bearing Restrictions: No      Mobility Bed Mobility               General bed mobility comments: Pt up in bathroom when I arrived  Transfers Overall transfer level: Modified independent Equipment used: None             General transfer comment: slower speed    Balance Overall balance assessment: Modified Independent                                         ADL either performed or assessed with clinical judgement   ADL                                         General ADL Comments: Educated pt on use of two cups for brushing teeth, use of wet wipes for back peri care, no "BLT", sit<>stand stance that is easier to keep back straight, not sitting for more than 20-30 minutes at a time working up to no more than 1 hour, Pt reports she is fully aware from previous back surgery how to properly get in and out of bed (this is also on handout).      Vision Patient Visual Report: No change from baseline              Pertinent Vitals/Pain Pain Assessment: No/denies pain     Hand Dominance Right   Extremity/Trunk Assessment Upper Extremity Assessment Upper Extremity Assessment: Overall WFL for tasks assessed           Communication Communication Communication: No difficulties   Cognition Arousal/Alertness: Awake/alert Behavior During Therapy: WFL for tasks assessed/performed Overall Cognitive Status: Within Functional Limits for tasks assessed                                                Home Living Family/patient expects to be discharged to:: Private residence Living Arrangements: Spouse/significant other Available Help at Discharge: Family;Available 24 hours/day Type of Home: House Home Access: Level entry     Home Layout: One level     Bathroom Shower/Tub: Producer, television/film/video: Standard     Home Equipment: Shower seat;Grab bars - tub/shower;Hand held shower head;Bedside commode  Prior Functioning/Environment Level of Independence: Independent                 OT Problem List: Decreased strength;Decreased range of motion;Impaired balance (sitting and/or standing)         OT Goals(Current goals can be found in the care plan section) Acute Rehab OT Goals Patient Stated Goal: to go home today  OT Frequency:                AM-PAC OT "6 Clicks" Daily Activity     Outcome Measure Help from another person eating meals?: None Help from another person taking care of personal grooming?: None Help from another person toileting, which includes using toliet, bedpan, or urinal?: None Help from another person bathing (including washing, rinsing, drying)?: A Little Help from another person to put on and taking off regular upper body clothing?: None Help from another person to put on and taking off regular lower body clothing?: A Little 6 Click Score:  22   End of Session Equipment Utilized During Treatment: Back brace Nurse Communication:  (no further OT needs and pt reports she does not feel she needs acute PT)  Activity Tolerance: Patient tolerated treatment well Patient left:  (sitting EOB getting ready to eat breakfast)  OT Visit Diagnosis: Muscle weakness (generalized) (M62.81);Unsteadiness on feet (R26.81)                Time: 1025-8527 OT Time Calculation (min): 19 min Charges:  OT General Charges $OT Visit: 1 Visit OT Evaluation $OT Eval Moderate Complexity: 1 Mod  Kiara [name], OTR/L Acute [employer] Pager [phone] Office [phone]     Kiara [name] 11/27/2019, 9:30 AM

## 2020-01-01 ENCOUNTER — Ambulatory Visit: Payer: BC Managed Care – PPO | Admitting: Nurse Practitioner

## 2020-01-01 ENCOUNTER — Other Ambulatory Visit: Payer: Self-pay

## 2020-01-01 ENCOUNTER — Encounter: Payer: Self-pay | Admitting: Nurse Practitioner

## 2020-01-01 VITALS — BP 110/80 | HR 64 | Ht 66.0 in | Wt 206.2 lb

## 2020-01-01 DIAGNOSIS — D6869 Other thrombophilia: Secondary | ICD-10-CM

## 2020-01-01 DIAGNOSIS — I480 Paroxysmal atrial fibrillation: Secondary | ICD-10-CM | POA: Diagnosis not present

## 2020-01-01 NOTE — Progress Notes (Signed)
Electrophysiology Office Note Date: 01/01/2020  ID:  Kiara, [name] 04-21-1960, MRN [id number]  PCP: [name], MD Electrophysiologist: Johney Frame  CC: AF Follow up  Kiara [name] is a 59 y.o. female seen today for Dr Johney Frame.  She presents today for routine electrophysiology followup.  Since last being seen in our clinic, the patient reports doing relatively well.   Her elderly mom had a bad fall and needed surgery to repair wrist and ulna.  She then underwent back surgery herself recently. With the increased stress, she has had some increase in AF burden. She typically takes a Metoprolol which terminates arrhythmia. She rarely has to take 2.  She denies chest pain, palpitations, dyspnea, PND, orthopnea, nausea, vomiting, dizziness, syncope, edema, weight gain, or early satiety.  Past Medical History:  Diagnosis Date  . Anxiety   . Asthma   . Atrial fibrillation (HCC)    a. admx with AFib with RVR and acute systolic CHF 07/2013; TEE with LAA clot and no DCCV done;    . CHF (congestive heart failure) (HCC) 2015  . Chronic systolic heart failure (HCC)   . Coronary artery disease    a.  LHC (07/17/13):  LM 20%, ostial CFX 20-30%  . Depression   . Dysrhythmia 2015   A fib  . History of kidney stones   . Hyperlipemia   . Hypothyroidism    past hx  . Nephrolithiasis 2013   "passed them"  . NICM (nonischemic cardiomyopathy) (HCC)    a. Echo (07/17/13):  EF 25-30%, diff HK, basal and mid anteroseptum/ basal inferoseptum and apical AK, trivial AI, mod MR, mod LAE, mild reduced RVSF, mild RAE  . Obesity   . Osteopenia    Past Surgical History:  Procedure Laterality Date  . ABDOMINAL HYSTERECTOMY  ~ 2001  . ABLATION  10/28/13   PVI by Dr Johney Frame  . APPENDECTOMY  1978  . ATRIAL FIBRILLATION ABLATION N/A 10/28/2013   Procedure: ATRIAL FIBRILLATION ABLATION;  Surgeon: Gardiner Rhyme, MD;  Location: [facility] CATH LAB;  Service: Cardiovascular;  Laterality: N/A;  . BACK SURGERY Bilateral  2019   Fusion  . CARDIAC CATHETERIZATION  07/2013  . CARDIOVERSION N/A 08/18/2013   Procedure: CARDIOVERSION;  Surgeon: Donato Schultz, MD;  Location: [facility] ENDOSCOPY;  Service: Cardiovascular;  Laterality: N/A;  . CARDIOVERSION N/A 08/29/2013   Procedure: CARDIOVERSION;  Surgeon: Pricilla Riffle, MD;  Location: [facility] OR;  Service: Cardiovascular;  Laterality: N/A;  . ELECTROPHYSIOLOGIC STUDY N/A 11/05/2014   Procedure: Atrial Fibrillation Ablation;  Surgeon: Hillis Range, MD;  Location: [facility] INVASIVE CV LAB;  Service: Cardiovascular;  Laterality: N/A;  . EYE SURGERY Bilateral 1999   Corrective laser surgery  . implantable loop recorder removal  10/02/2018   MDT Reveal LINQ removed in office by Dr Johney Frame for EOL battery  . IR SACROPLASTY BILATERAL  02/06/2019  . LEFT HEART CATHETERIZATION WITH CORONARY ANGIOGRAM N/A 07/17/2013   Procedure: LEFT HEART CATHETERIZATION WITH CORONARY ANGIOGRAM;  Surgeon: Micheline Chapman, MD;  Location[facility] C[facility] LAB;  Service: Cardiovascular;  Laterality: N/A;  . LOOP RECORDER IMPLANT N/A 06/05/2014   Procedure: LOOP RECORDER IMPLANT;  Surgeon: Hillis Range, MD;  Location: [facility] CATH LAB;  Service: Cardiovascular;  Laterality: N/A;  . TEE WITHOUT CARDIOVERSION N/A 07/18/2013   Procedure: TRANSESOPHAGEAL ECHOCARDIOGRAM (TEE);  Surgeon: Lewayne Bunting, MD;  Location: [facility] ENDOSCOPY;  Service: Cardiovascular;  Laterality: N/A;  . TEE WITHOUT CARDIOVERSION N/A 08/18/2013   Procedure: TRANSESOPHAGEAL ECHOCARDIOGRAM (  TEE);  Surgeon: Donato Schultz, MD;  Location: [facility] ENDOSCOPY;  Service: Cardiovascular;  Laterality: N/A;  . TEE WITHOUT CARDIOVERSION N/A 10/27/2013   Procedure: TRANSESOPHAGEAL ECHOCARDIOGRAM (TEE);  Surgeon: Pricilla Riffle, MD;  Location: [facility] ENDOSCOPY;  Service: Cardiovascular;  Laterality: N/A;  . TEE WITHOUT CARDIOVERSION N/A 11/04/2014   Procedure: TRANSESOPHAGEAL ECHOCARDIOGRAM (TEE);  Surgeon: Chrystie Nose, MD;  Location: [facility] ENDOSCOPY;  Service: Cardiovascular;  Laterality: N/A;     Current Outpatient Medications  Medication Sig Dispense Refill  . acetaminophen (TYLENOL) 500 MG tablet Take 1,000 mg by mouth every 6 (six) hours as needed for mild pain or headache.     Marland Kitchen atorvastatin (LIPITOR) 40 MG tablet TAKE 1 TABLET BY MOUTH EVERY DAY AT 6PM (Patient taking differently: Take 40 mg by mouth every evening. ) 90 tablet 2  . buPROPion (WELLBUTRIN XL) 150 MG 24 hr tablet Take 150 mg by mouth daily.    . celecoxib (CELEBREX) 200 MG capsule Take 1 capsule (200 mg total) by mouth 2 (two) times daily. 10 capsule 0  . ELIQUIS 5 MG TABS tablet TAKE 1 TABLET BY MOUTH TWICE A DAY (Patient taking differently: Take 5 mg by mouth 2 (two) times daily. ) 180 tablet 2  . flecainide (TAMBOCOR) 100 MG tablet TAKE 1 TABLET BY MOUTH TWICE A DAY 180 tablet 1  . furosemide (LASIX) 20 MG tablet TAKE 1 TABLET BY MOUTH EVERY OTHER DAY (Patient taking differently: Take 20 mg by mouth every other day. ) 90 tablet 3  . gabapentin (NEURONTIN) 300 MG capsule Take 300-600 mg by mouth See admin instructions. Take 300 mg by mouth in the morning and 600 mg at bedtime    . lisinopril (ZESTRIL) 5 MG tablet TAKE 1 TABLET BY MOUTH EVERY DAY 90 tablet 3  . methocarbamol (ROBAXIN) 500 MG tablet Take 1 tablet (500 mg total) by mouth every 8 (eight) hours as needed for muscle spasms. 60 tablet 1  . metoprolol tartrate (LOPRESSOR) 25 MG tablet TAKE 0.5 TABLETS (12.5 MG TOTAL) BY MOUTH AS DIRECTED. (Patient taking differently: Take 25 mg by mouth daily as needed (A-FIB). ) 45 tablet 3  . oxyCODONE 10 MG TABS Take 1 tablet (10 mg total) by mouth every 6 (six) hours as needed for severe pain ((score 7 to 10)). 30 tablet 0  . potassium chloride (K-DUR) 10 MEQ tablet Take 1 tablet (10 mEq total) by mouth every other day. When taking Lasix 45 tablet 3  . sertraline (ZOLOFT) 100 MG tablet Take 200 mg by mouth every morning.      No current facility-administered medications for this visit.   Facility-Administered  Medications Ordered in Other Visits  Medication Dose Route Frequency Provider Last Rate Last Admin  . 0.9 %  sodium chloride infusion   Intravenous Continuous Ralene Muskrat, PA-C      . 0.9 %  sodium chloride infusion   Intravenous Continuous Ralene Muskrat, PA-C        Allergies:   Azithromycin and Tape   Social History: Social History   Socioeconomic History  . Marital status: Married    Spouse name: Not on file  . Number of children: Not on file  . Years of education: Not on file  . Highest education level: Not on file  Occupational History  . Not on file  Tobacco Use  . Smoking status: Never Smoker  . Smokeless tobacco: Never Used  Vaping Use  . Vaping Use: Never used  Substance and Sexual Activity  .  Alcohol use: Yes    Comment: occasional  . Drug use: No  . Sexual activity: Yes  Other Topics Concern  . Not on file  Social History Narrative  . Not on file   Social Determinants of Health   Financial Resource Strain:   . Difficulty of Paying Living Expenses: Not on file  Food Insecurity:   . Worried About Programme researcher, broadcasting/film/video in the Last Year: Not on file  . Ran Out of Food in the Last Year: Not on file  Transportation Needs:   . Lack of Transportation (Medical): Not on file  . Lack of Transportation (Non-Medical): Not on file  Physical Activity:   . Days of Exercise per Week: Not on file  . Minutes of Exercise per Session: Not on file  Stress:   . Feeling of Stress : Not on file  Social Connections:   . Frequency of Communication with Friends and Family: Not on file  . Frequency of Social Gatherings with Friends and Family: Not on file  . Attends Religious Services: Not on file  . Active Member of Clubs or Organizations: Not on file  . Attends Banker Meetings: Not on file  . Marital Status: Not on file  Intimate Partner Violence:   . Fear of Current or Ex-Partner: Not on file  . Emotionally Abused: Not on file  . Physically Abused: Not on  file  . Sexually Abused: Not on file    Family History: Family History  Problem Relation Age of Onset  . Hypertension Mother   . Lymphoma Mother   . Hypertension Father     Review of Systems: All other systems reviewed and are otherwise negative except as noted above.   Physical Exam: VS:  There were no vitals taken for this visit. , BMI There is no height or weight on file to calculate BMI. Wt Readings from Last 3 Encounters:  11/26/19 205 lb 8 oz (93.2 kg)  11/24/19 205 lb 8 oz (93.2 kg)  05/28/19 190 lb (86.2 kg)    GEN- The patient is well appearing, alert and oriented x 3 today.   HEENT: normocephalic, atraumatic; sclera clear, conjunctiva pink; hearing intact; oropharynx clear; neck supple  Lungs- Clear to ausculation bilaterally, normal work of breathing.  No wheezes, rales, rhonchi Heart- Regular rate and rhythm  GI- soft, non-tender, non-distended, bowel sounds present  Extremities- no clubbing, cyanosis, or edema  MS- no significant deformity or atrophy Skin- warm and dry, no rash or lesion  Psych- euthymic mood, full affect Neuro- strength and sensation are intact   EKG:  EKG is ordered today. The ekg ordered today shows sinus rhythm, rate 61, PR , QRS , QTc  Recent Labs: 11/24/2019: BUN 10; Creatinine, Ser 0.84; Hemoglobin 14.0; Platelets 183; Potassium 4.1; Sodium 142    Other studies Reviewed: Additional studies/ records that were reviewed today include: Dr Jenel Lucks office notes  Assessment and Plan:  1.  Paroxysmal atrial fibrillation She has had some increase in AF burden with recent increase in stress She continues on Flecainide and Metoprolol prn EKG stable today Continue Eliquis for CHADS2VASC of 3 Recent BMET, CBC stable  2.  Previous tachycardia mediated cardiomyopathy Resolved with SR    Current medicines are reviewed at length with the patient today.   The patient does not have concerns regarding her medicines.   The following changes were made today:  none  Labs/ tests ordered today include: none No orders of the defined  types were placed in this encounter.    Disposition:   Follow up with Dr Johney Frame 1 year      Signed, Gypsy Balsam, NP 01/01/2020 5:25 AM   [facility] [address] [place] [address] [phone] (office) [phone] (fax)

## 2020-01-01 NOTE — Patient Instructions (Signed)
Medication Instructions:  °*If you need a refill on your cardiac medications before your next appointment, please call your pharmacy* ° °Follow-Up: °At [facility], you and your health needs are our priority.  As part of our continuing mission to provide you with exceptional heart care, we have created designated Provider Care Teams.  These Care Teams include your primary Cardiologist (physician) and Advanced Practice Providers (APPs -  Physician Assistants and Nurse Practitioners) who all work together to provide you with the care you need, when you need it. ° °We recommend signing up for the patient portal called "MyChart".  Sign up information is provided on this After Visit Summary.  MyChart is used to connect with patients for Virtual Visits (Telemedicine).  Patients are able to view lab/test results, encounter notes, upcoming appointments, etc.  Non-urgent messages can be sent to your provider as well.   °To learn more about what you can do with MyChart, go to [email or web address].   ° °Your next appointment:   °Your physician wants you to follow-up in: 6 MONTHS with Dr. Allred. You will receive a reminder letter in the mail two months in advance. If you don't receive a letter, please call our office to schedule the follow-up appointment. ° °The format for your next appointment:   °In Person with James Allred, MD ° ° ° ° °

## 2020-01-01 NOTE — Addendum Note (Signed)
Addended by: Anselm Pancoast R on: 01/01/2020 11:43 AM   Modules accepted: Orders

## 2020-02-23 ENCOUNTER — Other Ambulatory Visit: Payer: Self-pay | Admitting: Internal Medicine

## 2020-02-23 NOTE — Telephone Encounter (Signed)
Prescription refill request for Eliquis received.  Indication: Afib Last office visit: 01/01/2020,  [place] Scr: 0.84, 11/24/2019 Age: 60 yo Weight: 93.5 kg   Prescription refill sent.

## 2020-06-06 ENCOUNTER — Other Ambulatory Visit: Payer: Self-pay | Admitting: Internal Medicine

## 2020-07-15 ENCOUNTER — Other Ambulatory Visit: Payer: Self-pay | Admitting: Internal Medicine

## 2020-08-05 ENCOUNTER — Other Ambulatory Visit: Payer: Self-pay | Admitting: Internal Medicine

## 2020-08-22 ENCOUNTER — Other Ambulatory Visit: Payer: Self-pay | Admitting: Internal Medicine

## 2020-08-23 NOTE — Telephone Encounter (Signed)
Prescription refill request for Eliquis received. Indication: afib  Last office visit: Glory Buff 01/01/2020 Scr:  0.84, 11/24/2019 Age: 60 yo  Weight: 93.5 kg   Pt is on the correct dose of Eliquis per dosing criteria, prescription refill sent for Eliquis 5mg  BID.

## 2020-09-10 DIAGNOSIS — I10 Essential (primary) hypertension: Secondary | ICD-10-CM

## 2020-09-10 HISTORY — DX: Essential (primary) hypertension: I10

## 2020-09-13 ENCOUNTER — Encounter (HOSPITAL_BASED_OUTPATIENT_CLINIC_OR_DEPARTMENT_OTHER): Payer: Self-pay | Admitting: *Deleted

## 2020-09-13 ENCOUNTER — Ambulatory Visit: Payer: BC Managed Care – PPO | Admitting: Internal Medicine

## 2020-09-13 ENCOUNTER — Inpatient Hospital Stay (HOSPITAL_BASED_OUTPATIENT_CLINIC_OR_DEPARTMENT_OTHER)
Admission: EM | Admit: 2020-09-13 | Discharge: 2020-09-14 | DRG: 309 | Disposition: A | Discharge status: Home / Self Care | Payer: BC Managed Care – PPO | Attending: Internal Medicine | Admitting: Internal Medicine

## 2020-09-13 ENCOUNTER — Other Ambulatory Visit: Payer: Self-pay

## 2020-09-13 DIAGNOSIS — Z91048 Other nonmedicinal substance allergy status: Secondary | ICD-10-CM

## 2020-09-13 DIAGNOSIS — T50995A Adverse effect of other drugs, medicaments and biological substances, initial encounter: Secondary | ICD-10-CM | POA: Diagnosis present

## 2020-09-13 DIAGNOSIS — E860 Dehydration: Secondary | ICD-10-CM | POA: Diagnosis present

## 2020-09-13 DIAGNOSIS — I5022 Chronic systolic (congestive) heart failure: Secondary | ICD-10-CM | POA: Diagnosis present

## 2020-09-13 DIAGNOSIS — I4891 Unspecified atrial fibrillation: Secondary | ICD-10-CM | POA: Diagnosis present

## 2020-09-13 DIAGNOSIS — R112 Nausea with vomiting, unspecified: Secondary | ICD-10-CM | POA: Diagnosis present

## 2020-09-13 DIAGNOSIS — Z981 Arthrodesis status: Secondary | ICD-10-CM | POA: Diagnosis not present

## 2020-09-13 DIAGNOSIS — R1110 Vomiting, unspecified: Secondary | ICD-10-CM

## 2020-09-13 DIAGNOSIS — Z881 Allergy status to other antibiotic agents status: Secondary | ICD-10-CM

## 2020-09-13 DIAGNOSIS — E785 Hyperlipidemia, unspecified: Secondary | ICD-10-CM | POA: Diagnosis present

## 2020-09-13 DIAGNOSIS — Z8249 Family history of ischemic heart disease and other diseases of the circulatory system: Secondary | ICD-10-CM

## 2020-09-13 DIAGNOSIS — Z6831 Body mass index (BMI) 31.0-31.9, adult: Secondary | ICD-10-CM | POA: Diagnosis not present

## 2020-09-13 DIAGNOSIS — F419 Anxiety disorder, unspecified: Secondary | ICD-10-CM | POA: Diagnosis present

## 2020-09-13 DIAGNOSIS — F32A Depression, unspecified: Secondary | ICD-10-CM | POA: Diagnosis present

## 2020-09-13 DIAGNOSIS — Z7901 Long term (current) use of anticoagulants: Secondary | ICD-10-CM | POA: Diagnosis not present

## 2020-09-13 DIAGNOSIS — I2510 Atherosclerotic heart disease of native coronary artery without angina pectoris: Secondary | ICD-10-CM | POA: Diagnosis present

## 2020-09-13 DIAGNOSIS — I110 Hypertensive heart disease with heart failure: Secondary | ICD-10-CM | POA: Diagnosis present

## 2020-09-13 DIAGNOSIS — Z79899 Other long term (current) drug therapy: Secondary | ICD-10-CM

## 2020-09-13 DIAGNOSIS — I480 Paroxysmal atrial fibrillation: Secondary | ICD-10-CM | POA: Diagnosis not present

## 2020-09-13 DIAGNOSIS — Z20822 Contact with and (suspected) exposure to covid-19: Secondary | ICD-10-CM | POA: Diagnosis present

## 2020-09-13 DIAGNOSIS — Z90710 Acquired absence of both cervix and uterus: Secondary | ICD-10-CM | POA: Diagnosis not present

## 2020-09-13 LAB — CBC WITH DIFFERENTIAL/PLATELET
Abs Immature Granulocytes: 0.04 10*3/uL (ref 0.00–0.07)
Basophils Absolute: 0 10*3/uL (ref 0.0–0.1)
Basophils Relative: 0 %
Eosinophils Absolute: 0.1 10*3/uL (ref 0.0–0.5)
Eosinophils Relative: 1 %
HCT: 46.8 % — ABNORMAL HIGH (ref 36.0–46.0)
Hemoglobin: 15.6 g/dL — ABNORMAL HIGH (ref 12.0–15.0)
Immature Granulocytes: 0 %
Lymphocytes Relative: 15 %
Lymphs Abs: 1.4 10*3/uL (ref 0.7–4.0)
MCH: 30.3 pg (ref 26.0–34.0)
MCHC: 33.3 g/dL (ref 30.0–36.0)
MCV: 90.9 fL (ref 80.0–100.0)
Monocytes Absolute: 0.8 10*3/uL (ref 0.1–1.0)
Monocytes Relative: 9 %
Neutro Abs: 6.9 10*3/uL (ref 1.7–7.7)
Neutrophils Relative %: 75 %
Platelets: 205 10*3/uL (ref 150–400)
RBC: 5.15 MIL/uL — ABNORMAL HIGH (ref 3.87–5.11)
RDW: 13.4 % (ref 11.5–15.5)
WBC: 9.3 10*3/uL (ref 4.0–10.5)
nRBC: 0 % (ref 0.0–0.2)

## 2020-09-13 LAB — COMPREHENSIVE METABOLIC PANEL
ALT: 28 U/L (ref 0–44)
AST: 34 U/L (ref 15–41)
Albumin: 4.6 g/dL (ref 3.5–5.0)
Alkaline Phosphatase: 105 U/L (ref 38–126)
Anion gap: 10 (ref 5–15)
BUN: 17 mg/dL (ref 8–23)
CO2: 23 mmol/L (ref 22–32)
Calcium: 9.3 mg/dL (ref 8.9–10.3)
Chloride: 107 mmol/L (ref 98–111)
Creatinine, Ser: 0.78 mg/dL (ref 0.44–1.00)
GFR, Estimated: >60 mL/min (ref >60)
Glucose, Bld: 102 mg/dL — ABNORMAL HIGH (ref 70–99)
Potassium: 3.6 mmol/L (ref 3.5–5.1)
Sodium: 140 mmol/L (ref 135–145)
Total Bilirubin: 0.9 mg/dL (ref 0.3–1.2)
Total Protein: 7.5 g/dL (ref 6.5–8.1)

## 2020-09-13 LAB — RESP PANEL BY RT-PCR (FLU A&B, COVID) ARPGX2
Influenza A by PCR: NEGATIVE
Influenza B by PCR: NEGATIVE
SARS Coronavirus 2 by RT PCR: NEGATIVE

## 2020-09-13 LAB — MAGNESIUM: Magnesium: 2.1 mg/dL (ref 1.7–2.4)

## 2020-09-13 MED ORDER — APIXABAN 5 MG PO TABS
5.0000 mg | ORAL_TABLET | Freq: Two times a day (BID) | ORAL | Status: DC
  Administered 2020-09-14: 5 mg via ORAL
  Filled 2020-09-13: qty 1

## 2020-09-13 MED ORDER — LACTATED RINGERS IV SOLN: INTRAVENOUS | Status: DC

## 2020-09-13 MED ORDER — SODIUM CHLORIDE 0.9 % IV SOLN: INTRAVENOUS | Status: DC

## 2020-09-13 MED ORDER — ACETAMINOPHEN 650 MG RE SUPP: 650.0000 mg | Freq: Four times a day (QID) | RECTAL | Status: DC | PRN

## 2020-09-13 MED ORDER — DILTIAZEM LOAD VIA INFUSION
10.0000 mg | Freq: Once | INTRAVENOUS | Status: AC
  Administered 2020-09-13: 10 mg via INTRAVENOUS
  Filled 2020-09-13: qty 10

## 2020-09-13 MED ORDER — ONDANSETRON HCL 4 MG/2ML IJ SOLN
4.0000 mg | Freq: Once | INTRAMUSCULAR | Status: AC
  Administered 2020-09-13: 4 mg via INTRAVENOUS
  Filled 2020-09-13: qty 2

## 2020-09-13 MED ORDER — METOPROLOL TARTRATE 5 MG/5ML IV SOLN
5.0000 mg | INTRAVENOUS | Status: DC | PRN
  Administered 2020-09-13 (×2): 5 mg via INTRAVENOUS
  Filled 2020-09-13 (×2): qty 5

## 2020-09-13 MED ORDER — SODIUM CHLORIDE 0.9 % IV SOLN
12.5000 mg | Freq: Four times a day (QID) | INTRAVENOUS | Status: DC | PRN
  Administered 2020-09-14: 12.5 mg via INTRAVENOUS
  Filled 2020-09-13: qty 0.5

## 2020-09-13 MED ORDER — FLECAINIDE ACETATE 50 MG PO TABS: 100.0000 mg | ORAL_TABLET | Freq: Once | ORAL | Status: DC

## 2020-09-13 MED ORDER — ACETAMINOPHEN 325 MG PO TABS
650.0000 mg | ORAL_TABLET | Freq: Four times a day (QID) | ORAL | Status: DC | PRN
  Administered 2020-09-14 (×2): 650 mg via ORAL
  Filled 2020-09-13 (×2): qty 2

## 2020-09-13 MED ORDER — DILTIAZEM HCL-DEXTROSE 125-5 MG/125ML-% IV SOLN (PREMIX)
5.0000 mg/h | INTRAVENOUS | Status: DC
  Administered 2020-09-13: 5 mg/h via INTRAVENOUS
  Administered 2020-09-14: 10 mg/h via INTRAVENOUS
  Filled 2020-09-13 (×3): qty 125

## 2020-09-13 MED ORDER — ATORVASTATIN CALCIUM 40 MG PO TABS
40.0000 mg | ORAL_TABLET | Freq: Every day | ORAL | Status: DC
  Filled 2020-09-13: qty 1

## 2020-09-13 MED ORDER — ACETAMINOPHEN 500 MG PO TABS
1000.0000 mg | ORAL_TABLET | Freq: Once | ORAL | Status: AC
  Administered 2020-09-13: 1000 mg via ORAL
  Filled 2020-09-13: qty 2

## 2020-09-13 MED ORDER — LISINOPRIL 5 MG PO TABS
5.0000 mg | ORAL_TABLET | Freq: Every day | ORAL | Status: DC
  Filled 2020-09-13: qty 1

## 2020-09-13 MED ORDER — FLECAINIDE ACETATE 50 MG PO TABS
100.0000 mg | ORAL_TABLET | Freq: Two times a day (BID) | ORAL | Status: DC
  Administered 2020-09-14: 100 mg via ORAL
  Filled 2020-09-13: qty 2

## 2020-09-13 MED ORDER — SODIUM CHLORIDE 0.9 % IV BOLUS
1000.0000 mL | Freq: Once | INTRAVENOUS | Status: AC
  Administered 2020-09-13: 1000 mL via INTRAVENOUS

## 2020-09-13 NOTE — ED Triage Notes (Addendum)
C/o n/v x 3 days after increased of IM Wegovy  medication.

## 2020-09-13 NOTE — ED Notes (Signed)
Patient is resting comfortably. 

## 2020-09-13 NOTE — H&P (Signed)
History and Physical    STEPHENI CAMERON MWN:[id number] DOB: Jun 21, 1960 DOA: 09/13/2020  PCP: [name], MD   Patient coming from: Home via [facility] ER  Chief Complaint: nausea, vomiting  HPI: Kiara [name] is a 60 y.o. female with medical history significant for a-fib, chronic systolic CHF, HLD who presents with persistent nausea and vomiting.  She states that she received her dose of Wegovy at her endocrinologist office at the end of last week.  The dose was a higher dose as her therapy was being escalated.  On Saturday around noon she began to have severe nausea and vomiting.  She was unable to keep any p.o. intake down.  She was unable to take her normal medications.  She called her PCP and Phenergan was prescribed which she took at home which will control some of the vomiting and she is able to take very small sips of water on Sunday.  She states that she probably had maybe 4 ounces of fluid the entire day.  When she woke up on Monday morning she had overslept past her alarm to take her Phenergan and began to have recurrent nausea and vomiting episodes.  As she was unable to keep any p.o. intake down for the entire day she came to the emergency room for evaluation.  While in the emergency room she was found to be in atrial fibrillation with RVR.  She states that she can normally feel when she goes into A. fib but did not feel any palpitations or irregular heartbeat this time.  She was given Lopressor IV in the emergency room which did not control her A. fib with RVR and ER provider discussed with cardiology recommended starting Cardizem and then resuming her normal dose of flecainide.  She was given IV fluids and antiemetics in the emergency room.  ED Course: Patient was given IV fluids and antiemetics with no further episodes of vomiting.  She is remained hemodynamically stable.  Heart rate has improved while on the Cardizem in the 140-150 range down to the 100-110 range.  CMP and CBC were  unremarkable in the emergency room.  Hospitalist service is asked to admit for further management  Review of Systems:  General: Denies fever, chills, weight loss, night sweats.  Denies dizziness.  Denies change in appetite HENT: Denies head trauma, headache, denies change in hearing, tinnitus.  Denies nasal congestion or bleeding. Denies sore throat.  Eyes: Denies blurry vision, pain in eye, drainage.  Denies discoloration of eyes. Neck: Denies pain.  Denies swelling.  Denies pain with movement. Cardiovascular: Denies chest pain, palpitations.  Denies edema.  Denies orthopnea Respiratory: Denies shortness of breath, cough.  Denies wheezing.  Denies sputum production Gastrointestinal: Reports nausea and vomiting. Denies abdominal pain, swelling.  Denies diarrhea.  Denies melena.  Denies hematemesis. Musculoskeletal: Denies limitation of movement.  Denies deformity or swelling.  Denies pain.  Denies arthralgias or myalgias. Genitourinary: Denies pelvic pain.  Denies urinary frequency or hesitancy.  Denies dysuria.  Skin: Denies rash.  Denies petechiae, purpura, ecchymosis. Neurological:  Denies syncope.  Denies seizure activity. Denies slurred speech, drooping face.  Denies visual change. Psychiatric: Denies depression, anxiety.  Denies hallucinations.  Past Medical History:  Diagnosis Date   Anxiety    Asthma    Atrial fibrillation (HCC)    a. admx with AFib with RVR and acute systolic CHF 07/2013; TEE with LAA clot and no DCCV done;     CHF (congestive heart failure) (HCC) 2015   Chronic  systolic heart failure (HCC)    Coronary artery disease    a.  LHC (07/17/13):  LM 20%, ostial CFX 20-30%   Depression    Dysrhythmia 2015   A fib   History of kidney stones    Hyperlipemia    Hypothyroidism    past hx   Nephrolithiasis 2013   "passed them"   NICM (nonischemic cardiomyopathy) (HCC)    a. Echo (07/17/13):  EF 25-30%, diff HK, basal and mid anteroseptum/ basal inferoseptum and apical AK,  trivial AI, mod MR, mod LAE, mild reduced RVSF, mild RAE   Obesity    Osteopenia     Past Surgical History:  Procedure Laterality Date   ABDOMINAL HYSTERECTOMY  ~ 2001   ABLATION  10/28/13   PVI by Dr Johney Frame   APPENDECTOMY  1978   ATRIAL FIBRILLATION ABLATION N/A 10/28/2013   Procedure: ATRIAL FIBRILLATION ABLATION;  Surgeon: Gardiner Rhyme, MD;  Location: [facility] CATH LAB;  Service: Cardiovascular;  Laterality: N/A;   BACK SURGERY Bilateral 2019   Fusion   CARDIAC CATHETERIZATION  07/2013   CARDIOVERSION N/A 08/18/2013   Procedure: CARDIOVERSION;  Surgeon: Donato Schultz, MD;  Location: [facility] ENDOSCOPY;  Service: Cardiovascular;  Laterality: N/A;   CARDIOVERSION N/A 08/29/2013   Procedure: CARDIOVERSION;  Surgeon: Pricilla Riffle, MD;  Location: [facility] OR;  Service: Cardiovascular;  Laterality: N/A;   ELECTROPHYSIOLOGIC STUDY N/A 11/05/2014   Procedure: Atrial Fibrillation Ablation;  Surgeon: Hillis Range, MD;  Location: [facility] INVASIVE CV LAB;  Service: Cardiovascular;  Laterality: N/A;   EYE SURGERY Bilateral 1999   Corrective laser surgery   implantable loop recorder removal  10/02/2018   MDT Reveal LINQ removed in office by Dr Johney Frame for EOL battery   IR SACROPLASTY BILATERAL  02/06/2019   LEFT HEART CATHETERIZATION WITH CORONARY ANGIOGRAM N/A 07/17/2013   Procedure: LEFT HEART CATHETERIZATION WITH CORONARY ANGIOGRAM;  Surgeon: Micheline Chapman, MD;  Location: [facility] CATH LAB;  Service: Cardiovascular;  Laterality: N/A;   LOOP RECORDER IMPLANT N/A 06/05/2014   Procedure: LOOP RECORDER IMPLANT;  Surgeon: Hillis Range, MD;  Location: [facility] CATH LAB;  Service: Cardiovascular;  Laterality: N/A;   TEE WITHOUT CARDIOVERSION N/A 07/18/2013   Procedure: TRANSESOPHAGEAL ECHOCARDIOGRAM (TEE);  Surgeon: Lewayne Bunting, MD;  Location: [facility] ENDOSCOPY;  Service: Cardiovascular;  Laterality: N/A;   TEE WITHOUT CARDIOVERSION N/A 08/18/2013   Procedure: TRANSESOPHAGEAL ECHOCARDIOGRAM (TEE);  Surgeon: Donato Schultz, MD;  Location: [facility]  ENDOSCOPY;  Service: Cardiovascular;  Laterality: N/A;   TEE WITHOUT CARDIOVERSION N/A 10/27/2013   Procedure: TRANSESOPHAGEAL ECHOCARDIOGRAM (TEE);  Surgeon: Pricilla Riffle, MD;  Location: [facility] ENDOSCOPY;  Service: Cardiovascular;  Laterality: N/A;   TEE WITHOUT CARDIOVERSION N/A 11/04/2014   Procedure: TRANSESOPHAGEAL ECHOCARDIOGRAM (TEE);  Surgeon: Chrystie Nose, MD;  Location: [facility] ENDOSCOPY;  Service: Cardiovascular;  Laterality: N/A;    Social History  reports that she has never smoked. She has never used smokeless tobacco. She reports current alcohol use. She reports that she does not use drugs.  Allergies  Allergen Reactions   Azithromycin Diarrhea and Nausea And Vomiting   Tape Rash    Family History  Problem Relation Age of Onset   Hypertension Mother    Lymphoma Mother    Hypertension Father      Prior to Admission medications   Medication Sig Start Date End Date Taking? Authorizing Provider  acetaminophen (TYLENOL) 500 MG tablet Take 1,000 mg by mouth every 6 (six) hours as needed for mild pain or headache.  [provider]  atorvastatin (LIPITOR) 40 MG tablet TAKE 1 TABLET BY MOUTH EVERY DAY AT 6PM 07/15/20   Allred, Fayrene Fearing, MD  buPROPion (WELLBUTRIN XL) 150 MG 24 hr tablet Take 150 mg by mouth daily. 09/09/15   [provider]  ELIQUIS 5 MG TABS tablet TAKE 1 TABLET BY MOUTH TWICE A DAY 08/23/20   Allred, Fayrene Fearing, MD  flecainide (TAMBOCOR) 100 MG tablet TAKE 1 TABLET BY MOUTH TWICE A DAY 06/07/20   Allred, Fayrene Fearing, MD  furosemide (LASIX) 20 MG tablet TAKE 1 TABLET BY MOUTH EVERY OTHER DAY 08/05/20   Allred, Fayrene Fearing, MD  gabapentin (NEURONTIN) 300 MG capsule Take 300-600 mg by mouth See admin instructions. Take 300 mg by mouth in the morning and 600 mg at bedtime    [provider]  lisinopril (ZESTRIL) 5 MG tablet TAKE 1 TABLET BY MOUTH EVERY DAY 11/20/19   Allred, Fayrene Fearing, MD  metoprolol tartrate (LOPRESSOR) 25 MG tablet TAKE 0.5 TABLETS (12.5 MG TOTAL) BY  MOUTH AS DIRECTED. 06/16/19   Allred, Fayrene Fearing, MD  potassium chloride (K-DUR) 10 MEQ tablet Take 1 tablet (10 mEq total) by mouth every other day. When taking Lasix 10/02/18   Allred, Fayrene Fearing, MD  sertraline (ZOLOFT) 100 MG tablet Take 200 mg by mouth every morning.  07/04/13   [provider]    Physical Exam: Vitals:   09/13/20 1800 09/13/20 1846 09/13/20 2015 09/13/20 2240  BP: (!) 123/94 (!) 135/103 135/90 116/86  Pulse: (!) 120 (!) 132 (!) 128   Resp: 18 18 (!) 23 18  Temp:   98.5 F (36.9 C) 98.1 F (36.7 C)  TempSrc:   Oral Oral  SpO2: 95% 95% 98% (!) 65%  Weight:    87 kg  Height:    5\' 6"  (1.676 m)    Constitutional: NAD, calm, comfortable Vitals:   09/13/20 1800 09/13/20 1846 09/13/20 2015 09/13/20 2240  BP: (!) 123/94 (!) 135/103 135/90 116/86  Pulse: (!) 120 (!) 132 (!) 128   Resp: 18 18 (!) 23 18  Temp:   98.5 F (36.9 C) 98.1 F (36.7 C)  TempSrc:   Oral Oral  SpO2: 95% 95% 98% (!) 65%  Weight:    87 kg  Height:    5\' 6"  (1.676 m)   General: WDWN, Alert and oriented x3.  Eyes: EOMI, PERRL, conjunctivae normal.  Sclera nonicteric HENT: [place]/AT. Nares patent with no drainage. External ears normal.  Oropharynx without erythema or exudate.  Mucous membranes dry Neck: Soft, normal range of motion, supple, no masses, Trachea midline Respiratory: clear to auscultation bilaterally, no wheezing, no crackles. Normal respiratory effort. No accessory muscle use.  Cardiovascular: Irregularly irregular rhythm, with tachycardia.  No murmurs / rubs / gallops. No extremity edema. 2+ pedal pulses. Abdomen: Soft, no tenderness, nondistended.  No masses palpated.Bowel sounds normoactive Musculoskeletal: FROM. No cyanosis. Normal muscle tone.  Skin: Warm, dry, intact no rashes, lesions, ulcers. No induration Neurologic: CN 2-12 grossly intact.  Normal speech.  Sensation intact to touch. Strength 5/5 in all extremities.   Psychiatric: Normal judgment and insight.  Normal mood.     Labs on Admission: I have personally reviewed following labs and imaging studies  CBC: Recent Labs  Lab 09/13/20 1530  WBC 9.3  NEUTROABS 6.9  HGB 15.6*  HCT 46.8*  MCV 90.9  PLT 205    Basic Metabolic Panel: Recent Labs  Lab 09/13/20 1530 09/13/20 1546  NA 140  --   K 3.6  --  CL 107  --   CO2 23  --   GLUCOSE 102*  --   BUN 17  --   CREATININE 0.78  --   CALCIUM 9.3  --   MG  --  2.1    GFR: Estimated Creatinine Clearance: 81 mL/min (by C-G formula based on SCr of 0.78 mg/dL).  Liver Function Tests: Recent Labs  Lab 09/13/20 1530  AST 34  ALT 28  ALKPHOS 105  BILITOT 0.9  PROT 7.5  ALBUMIN 4.6    Urine analysis:    Component Value Date/Time   COLORURINE YELLOW 02/06/2019 0700   APPEARANCEUR CLEAR 02/06/2019 0700   LABSPEC 1.017 02/06/2019 0700   PHURINE 5.0 02/06/2019 0700   GLUCOSEU NEGATIVE 02/06/2019 0700   HGBUR NEGATIVE 02/06/2019 0700   BILIRUBINUR NEGATIVE 02/06/2019 0700   KETONESUR NEGATIVE 02/06/2019 0700   PROTEINUR NEGATIVE 02/06/2019 0700   NITRITE NEGATIVE 02/06/2019 0700   LEUKOCYTESUR NEGATIVE 02/06/2019 0700    Radiological Exams on Admission: No results found.  EKG: Independently reviewed.  EKG shows atrial fibrillation with RVR.  No acute ST elevation or depression.  QTc 458  Assessment/Plan Principal Problem:   Atrial fibrillation with RVR  Ms. Udall is admitted to progressive care unit.  Started on cardizem infusion in the ER. Flecainide resumed at normal dose per cardiology recommendation.  Continue eliquis for anticoagulation Continue statin therapy  Active Problems:   Non-intractable vomiting Phenergen for antiemetic provided  Clear liquid diet as tolerated. Advance slowly as tolerated.     Chronic systolic heart failure  Continue lisinopril. Monitor fluid status. Lasix per home dose.     DVT prophylaxis: Eliquis continued for anticoagulation Code Status:   Full code Family Communication:   Diagnosis and plan discussed with patient.  Patient verbalized understanding agrees with plan.  Further recommendation to follow as clinical indicated Disposition Plan:   Patient is from:  Home  Anticipated DC to:  Home  Anticipated DC date:  Anticipate 2 midnight stay  Consults called:  Cardiology, Dr. Duke Salvia, consulted by ER provider  Admission status:  Inpatient   Claudean Severance Jelina Paulsen MD [facility]  How to contact the [facility] Attending or Consulting provider 7A - 7P or covering provider during after hours 7P -7A, for this patient?   Check the care team in [facility] and look for a) attending/consulting [facility] provider listed and b) the [facility] team listed Log into [email or web address] and use [facility] universal password to access. If you do not have the password, please contact the hospital operator. Locate the [facility] provider you are looking for under [facility] and page to a number that you can be directly reached. If you still have difficulty reaching the provider, please page the [facility] (Director on Call) for the Hospitalists listed on amion for assistance.  09/13/2020, 11:37 PM

## 2020-09-13 NOTE — ED Provider Notes (Signed)
[facility] EMERGENCY DEPARTMENT Provider Note   CSN: [id number] Arrival date & time: 09/13/20  1445     History Chief Complaint  Patient presents with   Vomiting    Kiara [name] is a 60 y.o. female with PMHx A fib on Eliquis, HTN, HLD, CAD, CHF with EF 55% who presents to the ED today with complaint of nausea and NBNB emesis that began Saturday ( 2 days ago).  Husband reports that she was recently started on an increased dose of IM Wegovy for weight loss.  States that 2 hours after she received the injection she began having nausea and vomiting.  He called the after-hours clinic Saturday night who prescribed him oral Phenergan.  He states that after oral Phenergan she was able to tolerate very small sips of water.  He states that she had been doing well yesterday however this morning they slept through their alarm and he missed a dose of oral Phenergan by 1.5 hours.  He states that since that time she has had continued vomiting.  He called the after-hours clinic again and they advised to come to the ED for likely IV fluids. Husband reports that pt missed all doses of her medications Saturday due to emesis. He was able to get 1 dose of both Eliquis and Flecainide in her yesterday. Pt denies feeling like she has been in A fib.   The history is provided by the patient, medical records and the spouse.      Past Medical History:  Diagnosis Date   Anxiety    Asthma    Atrial fibrillation (HCC)    a. admx with AFib with RVR and acute systolic CHF 07/2013; TEE with LAA clot and no DCCV done;     CHF (congestive heart failure) (HCC) 2015   Chronic systolic heart failure (HCC)    Coronary artery disease    a.  LHC (07/17/13):  LM 20%, ostial CFX 20-30%   Depression    Dysrhythmia 2015   A fib   History of kidney stones    Hyperlipemia    Hypothyroidism    past hx   Nephrolithiasis 2013   "passed them"   NICM (nonischemic cardiomyopathy) (HCC)    a. Echo (07/17/13):  EF 25-30%,  diff HK, basal and mid anteroseptum/ basal inferoseptum and apical AK, trivial AI, mod MR, mod LAE, mild reduced RVSF, mild RAE   Obesity    Osteopenia     Patient Active Problem List   Diagnosis Date Noted   S/P lumbar fusion 11/26/2019   A-fib (HCC) 11/05/2014   Atrial fibrillation with RVR (HCC)    Fatigue 04/13/2014   Sinus bradycardia 08/28/2013   Minor CAD at cath 07/18/13    Chronic systolic heart failure (HCC)    NICM (nonischemic cardiomyopathy) with EF 25-30%    Atrial fibrillation (HCC)    Dyslipidemia 07/21/2013   History of asthma 07/21/2013   Moderate mitral regurgitation 07/17/2013    Past Surgical History:  Procedure Laterality Date   ABDOMINAL HYSTERECTOMY  ~ 2001   ABLATION  10/28/13   PVI by Dr Johney Frame   APPENDECTOMY  1978   ATRIAL FIBRILLATION ABLATION N/A 10/28/2013   Procedure: ATRIAL FIBRILLATION ABLATION;  Surgeon: Gardiner Rhyme, MD;  Location: [facility] CATH LAB;  Service: Cardiovascular;  Laterality: N/A;   BACK SURGERY Bilateral 2019   Fusion   CARDIAC CATHETERIZATION  07/2013   CARDIOVERSION N/A 08/18/2013   Procedure: CARDIOVERSION;  Surgeon: Donato Schultz, MD;  Location: [facility] ENDOSCOPY;  Service: Cardiovascular;  Laterality: N/A;   CARDIOVERSION N/A 08/29/2013   Procedure: CARDIOVERSION;  Surgeon: Pricilla Riffle, MD;  Location: [facility] OR;  Service: Cardiovascular;  Laterality: N/A;   ELECTROPHYSIOLOGIC STUDY N/A 11/05/2014   Procedure: Atrial Fibrillation Ablation;  Surgeon: Hillis Range, MD;  Location: [facility] INVASIVE CV LAB;  Service: Cardiovascular;  Laterality: N/A;   EYE SURGERY Bilateral 1999   Corrective laser surgery   implantable loop recorder removal  10/02/2018   MDT Reveal LINQ removed in office by Dr Johney Frame for EOL battery   IR SACROPLASTY BILATERAL  02/06/2019   LEFT HEART CATHETERIZATION WITH CORONARY ANGIOGRAM N/A 07/17/2013   Procedure: LEFT HEART CATHETERIZATION WITH CORONARY ANGIOGRAM;  Surgeon: Micheline Chapman, MD;  Location: [facility] CATH LAB;  Service:  Cardiovascular;  Laterality: N/A;   LOOP RECORDER IMPLANT N/A 06/05/2014   Procedure: LOOP RECORDER IMPLANT;  Surgeon: Hillis Range, MD;  Location: [facility] CATH LAB;  Service: Cardiovascular;  Laterality: N/A;   TEE WITHOUT CARDIOVERSION N/A 07/18/2013   Procedure: TRANSESOPHAGEAL ECHOCARDIOGRAM (TEE);  Surgeon: Lewayne Bunting, MD;  Location: [facility] ENDOSCOPY;  Service: Cardiovascular;  Laterality: N/A;   TEE WITHOUT CARDIOVERSION N/A 08/18/2013   Procedure: TRANSESOPHAGEAL ECHOCARDIOGRAM (TEE);  Surgeon: Donato Schultz, MD;  Location: [facility] ENDOSCOPY;  Service: Cardiovascular;  Laterality: N/A;   TEE WITHOUT CARDIOVERSION N/A 10/27/2013   Procedure: TRANSESOPHAGEAL ECHOCARDIOGRAM (TEE);  Surgeon: Pricilla Riffle, MD;  Location: [facility] ENDOSCOPY;  Service: Cardiovascular;  Laterality: N/A;   TEE WITHOUT CARDIOVERSION N/A 11/04/2014   Procedure: TRANSESOPHAGEAL ECHOCARDIOGRAM (TEE);  Surgeon: Chrystie Nose, MD;  Location: [facility] ENDOSCOPY;  Service: Cardiovascular;  Laterality: N/A;     OB History   No obstetric history on file.     Family History  Problem Relation Age of Onset   Hypertension Mother    Lymphoma Mother    Hypertension Father     Social History   Tobacco Use   Smoking status: Never   Smokeless tobacco: Never  Vaping Use   Vaping Use: Never used  Substance Use Topics   Alcohol use: Yes    Comment: occasional   Drug use: No    Home Medications Prior to Admission medications   Medication Sig Start Date End Date Taking? Authorizing Provider  acetaminophen (TYLENOL) 500 MG tablet Take 1,000 mg by mouth every 6 (six) hours as needed for mild pain or headache.     [provider]  atorvastatin (LIPITOR) 40 MG tablet TAKE 1 TABLET BY MOUTH EVERY DAY AT 6PM 07/15/20   Allred, Fayrene Fearing, MD  buPROPion (WELLBUTRIN XL) 150 MG 24 hr tablet Take 150 mg by mouth daily. 09/09/15   [provider]  ELIQUIS 5 MG TABS tablet TAKE 1 TABLET BY MOUTH TWICE A DAY 08/23/20   Allred, Fayrene Fearing, MD   flecainide (TAMBOCOR) 100 MG tablet TAKE 1 TABLET BY MOUTH TWICE A DAY 06/07/20   Allred, Fayrene Fearing, MD  furosemide (LASIX) 20 MG tablet TAKE 1 TABLET BY MOUTH EVERY OTHER DAY 08/05/20   Allred, Fayrene Fearing, MD  gabapentin (NEURONTIN) 300 MG capsule Take 300-600 mg by mouth See admin instructions. Take 300 mg by mouth in the morning and 600 mg at bedtime    [provider]  lisinopril (ZESTRIL) 5 MG tablet TAKE 1 TABLET BY MOUTH EVERY DAY 11/20/19   Allred, Fayrene Fearing, MD  metoprolol tartrate (LOPRESSOR) 25 MG tablet TAKE 0.5 TABLETS (12.5 MG TOTAL) BY MOUTH AS DIRECTED. 06/16/19   Hillis Range, MD  potassium  chloride (K-DUR) 10 MEQ tablet Take 1 tablet (10 mEq total) by mouth every other day. When taking Lasix 10/02/18   Allred, Fayrene Fearing, MD  sertraline (ZOLOFT) 100 MG tablet Take 200 mg by mouth every morning.  07/04/13   [provider]    Allergies    Azithromycin and Tape  Review of Systems   Review of Systems  Constitutional:  Negative for chills and fever.  Gastrointestinal:  Positive for nausea and vomiting. Negative for abdominal pain and diarrhea.  All other systems reviewed and are negative.  Physical Exam Updated Vital Signs BP (!) 135/103 (BP Location: Right Arm)   Pulse (!) 132 Comment: Afib  Temp 98.5 F (36.9 C) (Oral)   Resp 18   Ht 5\' 6"  (1.676 m)   Wt 88 kg   SpO2 95%   BMI 31.31 kg/m   Physical Exam Vitals and nursing note reviewed.  Constitutional:      Appearance: She is not ill-appearing or diaphoretic.  HENT:     Head: Normocephalic and atraumatic.     Mouth/Throat:     Mouth: Mucous membranes are dry.  Eyes:     Conjunctiva/sclera: Conjunctivae normal.  Cardiovascular:     Rate and Rhythm: Normal rate and regular rhythm.     Pulses: Normal pulses.  Pulmonary:     Effort: Pulmonary effort is normal.     Breath sounds: Normal breath sounds. No wheezing, rhonchi or rales.  Abdominal:     Palpations: Abdomen is soft.     Tenderness: There is no  abdominal tenderness. There is no guarding or rebound.  Musculoskeletal:     Cervical back: Neck supple.  Skin:    General: Skin is warm and dry.  Neurological:     Mental Status: She is alert.    ED Results / Procedures / Treatments   Labs (all labs ordered are listed, but only abnormal results are displayed) Labs Reviewed  COMPREHENSIVE METABOLIC PANEL - Abnormal; Notable for the following components:      Result Value   Glucose, Bld 102 (*)    All other components within normal limits  CBC WITH DIFFERENTIAL/PLATELET - Abnormal; Notable for the following components:   RBC 5.15 (*)    Hemoglobin 15.6 (*)    HCT 46.8 (*)    All other components within normal limits  RESP PANEL BY RT-PCR (FLU A&B, COVID) ARPGX2  MAGNESIUM  URINALYSIS, ROUTINE W REFLEX MICROSCOPIC    EKG EKG Interpretation  Date/Time:  Monday September 13 2020 15:43:03 EDT Ventricular Rate:  152 PR Interval:    QRS Duration: 104 QT Interval:  288 QTC Calculation: 458 R Axis:   54 Text Interpretation: Atrial flutter with predominant 2:1 AV block Repolarization abnormality, prob rate related Confirmed by Vanetta Mulders [phone]) on 09/13/2020 3:51:46 PM  Radiology No results found.  Procedures Procedures   Medications Ordered in ED Medications  metoprolol tartrate (LOPRESSOR) injection 5 mg (5 mg Intravenous Given 09/13/20 1627)  diltiazem (CARDIZEM) 1 mg/mL load via infusion 10 mg (10 mg Intravenous Bolus from Bag 09/13/20 1815)    And  diltiazem (CARDIZEM) 125 mg in dextrose 5% 125 mL (1 mg/mL) infusion (7.5 mg/hr Intravenous Rate/Dose Change 09/13/20 1909)  flecainide (TAMBOCOR) tablet 100 mg (has no administration in time range)  0.9 %  sodium chloride infusion ( Intravenous New Bag/Given 09/13/20 1812)  sodium chloride 0.9 % bolus 1,000 mL (0 mLs Intravenous Stopped 09/13/20 1700)  ondansetron (ZOFRAN) injection 4 mg (4 mg Intravenous Given  09/13/20 1552)    ED Course  I have reviewed the triage vital signs  and the nursing notes.  Pertinent labs & imaging results that were available during my care of the patient were reviewed by me and considered in my medical decision making (see chart for details).    MDM Rules/Calculators/A&P                           60 year old female presents to the ED today with complaint of nausea, vomiting secondary to wegovy injection that read she received Saturday, increased dosage.  Has missed multiple doses of her flecainide and Eliquis since then.  Mostly came in for dehydration and continued vomiting.  On arrival to the ED patient was initially afebrile, nontachycardic and nontachypneic and appears to be in no acute distress however shortly in the ER visit she landed herself in A. fib with RVR.  She states that she typically can feel herself go into A. fib however states that she did not feel it.  No chest pain or shortness of breath and continues to have stable blood pressure.  We will plan for IV fluids, screening labs to assess for electrolyte abnormalities given nausea and vomiting, will provide 5 mg Lopressor IV and reevaluate.  Without any abdominal tenderness palpation on exam and denies any abdominal pain whatsoever.  Do suspect her nausea and vomiting is likely secondary to the junction she received a Saturday.  Continues to be in A. fib with RVR despite 2 doses of 5 mg Lopressor.  She was initially in the 150s however now in the 120s to 130s.  Will consult cardiology for further recommendations.  Discussed case with cardiologist Dr. Duke Salvia who recommends diltiazem drip and admission.   Screening labs reassuring at this time without electrolyte abnormalities.  Will consult for admission.  Discussed case with Dr. Chipper Herb [facility] hospitalist who agrees to accept patient for admission.   This note was prepared using Dragon voice recognition software and may include unintentional dictation errors due to the inherent limitations of voice recognition  software.   Final Clinical Impression(s) / ED Diagnoses Final diagnoses:  Atrial fibrillation with RVR (HCC)  Non-intractable vomiting with nausea, unspecified vomiting type    Rx / DC Orders ED Discharge Orders     None        Tanda Rockers, PA-C 09/13/20 1915    Vanetta Mulders, MD 09/16/20 1255

## 2020-09-14 ENCOUNTER — Encounter (HOSPITAL_COMMUNITY): Payer: Self-pay | Admitting: Internal Medicine

## 2020-09-14 ENCOUNTER — Telehealth: Payer: Self-pay | Admitting: Internal Medicine

## 2020-09-14 DIAGNOSIS — I4891 Unspecified atrial fibrillation: Secondary | ICD-10-CM

## 2020-09-14 LAB — URINALYSIS, ROUTINE W REFLEX MICROSCOPIC
Bacteria, UA: NONE SEEN
Bilirubin Urine: NEGATIVE
Glucose, UA: NEGATIVE mg/dL
Hgb urine dipstick: NEGATIVE
Ketones, ur: 5 mg/dL — AB
Nitrite: NEGATIVE
Protein, ur: NEGATIVE mg/dL
Specific Gravity, Urine: 1.02 (ref 1.005–1.030)
pH: 6 (ref 5.0–8.0)

## 2020-09-14 LAB — BASIC METABOLIC PANEL
Anion gap: 9 (ref 5–15)
BUN: 15 mg/dL (ref 8–23)
CO2: 22 mmol/L (ref 22–32)
Calcium: 9.1 mg/dL (ref 8.9–10.3)
Chloride: 108 mmol/L (ref 98–111)
Creatinine, Ser: 0.81 mg/dL (ref 0.44–1.00)
GFR, Estimated: >60 mL/min (ref >60)
Glucose, Bld: 84 mg/dL (ref 70–99)
Potassium: 3.7 mmol/L (ref 3.5–5.1)
Sodium: 139 mmol/L (ref 135–145)

## 2020-09-14 LAB — HEMOGLOBIN A1C
Hgb A1c MFr Bld: 5.4 % (ref 4.8–5.6)
Mean Plasma Glucose: 108.28 mg/dL

## 2020-09-14 LAB — TROPONIN I (HIGH SENSITIVITY)
Troponin I (High Sensitivity): 4 ng/L (ref <18)
Troponin I (High Sensitivity): 5 ng/L (ref <18)
Troponin I (High Sensitivity): 4 ng/L (ref <18)

## 2020-09-14 LAB — HIV ANTIBODY (ROUTINE TESTING W REFLEX): HIV Screen 4th Generation wRfx: NONREACTIVE

## 2020-09-14 MED ORDER — INSULIN ASPART 100 UNIT/ML IJ SOLN: 0.0000 [IU] | Freq: Three times a day (TID) | INTRAMUSCULAR | Status: DC

## 2020-09-14 MED ORDER — ACETAMINOPHEN 325 MG PO TABS
325.0000 mg | ORAL_TABLET | Freq: Once | ORAL | Status: AC
  Administered 2020-09-14: 325 mg via ORAL
  Filled 2020-09-14: qty 1

## 2020-09-14 NOTE — Consult Note (Addendum)
Cardiology Consultation:   Patient ID: Kiara [name]; [id number]; July 28, 1960   Admit date: 09/13/2020 Date of Consult: 09/14/2020  Primary Care Provider: [name], MD Primary Cardiologist/Primary Electrophysiologist: Dr. Hillis Range, MD   Patient Profile:   Kiara [name] is a 60 y.o. female with a hx of paroxysmal atrial fibrillation s/p ablation on Flecainide and chronic Eliquis 2016 with loop implantation, hx of tachy-mediated cardiomyopathy/systolic CHF which has resolved, minimal CAD per cath 2015 with 20% LM and 20-30% ostial LCX, and HLD who is being seen today for the evaluation of atrial fibrillation with RVR at the request of Dr. Jonathon Bellows.  History of Present Illness:   Kiara [name] is a 60yo F with a hx as stated above who presented to [facility] with nausea and vomiting after an increased dose of Wegovy. Pt reports that she was started on Wegovy in April by her PCP. She has tolerated this well however on her last dose increase this past Saturday, she began intractable nausea and vomiting. She was unable to keep any of her medications, food, or water down. She called her PCP and was placed on phenergan suppository which helped. Out of concern for dehydration, she  presentation 09/13/20 to the ED. On admission, she was found to be in AF with RVR. She was started back on home Flecainide, Eliquis and also IV diltiazem for better rate control. She missed a total of 4 doses of Flecainide and Eliquis. She denies SOB, palpitations, LE edema, dizziness or syncope. She has since converted to NSR with stable rates.   Cardiology has been consulted for further AF management.   She has been followed by Dr. Johney Frame for her cardiology care. She was last seen in follow up 05/28/2019 and was noted to be doing well from a CV standpoint. She had minimal sensations of palpitations, mostly in the setting of increased stress.   Past Medical History:  Diagnosis Date   Anxiety    Asthma    Atrial fibrillation  (HCC)    a. admx with AFib with RVR and acute systolic CHF 07/2013; TEE with LAA clot and no DCCV done;     CHF (congestive heart failure) (HCC) 2015   Chronic systolic heart failure (HCC)    Coronary artery disease    a.  LHC (07/17/13):  LM 20%, ostial CFX 20-30%   Depression    Dysrhythmia 2015   A fib   History of kidney stones    Hyperlipemia    Hypothyroidism    past hx   Nephrolithiasis 2013   "passed them"   NICM (nonischemic cardiomyopathy) (HCC)    a. Echo (07/17/13):  EF 25-30%, diff HK, basal and mid anteroseptum/ basal inferoseptum and apical AK, trivial AI, mod MR, mod LAE, mild reduced RVSF, mild RAE   Obesity    Osteopenia     Past Surgical History:  Procedure Laterality Date   ABDOMINAL HYSTERECTOMY  ~ 2001   ABLATION  10/28/13   PVI by Dr Johney Frame   APPENDECTOMY  1978   ATRIAL FIBRILLATION ABLATION N/A 10/28/2013   Procedure: ATRIAL FIBRILLATION ABLATION;  Surgeon: Gardiner Rhyme, MD;  Location: [facility] CATH LAB;  Service: Cardiovascular;  Laterality: N/A;   BACK SURGERY Bilateral 2019   Fusion   CARDIAC CATHETERIZATION  07/2013   CARDIOVERSION N/A 08/18/2013   Procedure: CARDIOVERSION;  Surgeon: Donato Schultz, MD;  Location: [facility] ENDOSCOPY;  Service: Cardiovascular;  Laterality: N/A;   CARDIOVERSION N/A 08/29/2013   Procedure: CARDIOVERSION;  Surgeon:  Pricilla Riffle, MD;  Location: [facility] OR;  Service: Cardiovascular;  Laterality: N/A;   ELECTROPHYSIOLOGIC STUDY N/A 11/05/2014   Procedure: Atrial Fibrillation Ablation;  Surgeon: Hillis Range, MD;  Location: [facility] INVASIVE CV LAB;  Service: Cardiovascular;  Laterality: N/A;   EYE SURGERY Bilateral 1999   Corrective laser surgery   implantable loop recorder removal  10/02/2018   MDT Reveal LINQ removed in office by Dr Johney Frame for EOL battery   IR SACROPLASTY BILATERAL  02/06/2019   LEFT HEART CATHETERIZATION WITH CORONARY ANGIOGRAM N/A 07/17/2013   Procedure: LEFT HEART CATHETERIZATION WITH CORONARY ANGIOGRAM;  Surgeon: Micheline Chapman,  MD;  Location: [facility] CATH LAB;  Service: Cardiovascular;  Laterality: N/A;   LOOP RECORDER IMPLANT N/A 06/05/2014   Procedure: LOOP RECORDER IMPLANT;  Surgeon: Hillis Range, MD;  Location: [facility] CATH LAB;  Service: Cardiovascular;  Laterality: N/A;   TEE WITHOUT CARDIOVERSION N/A 07/18/2013   Procedure: TRANSESOPHAGEAL ECHOCARDIOGRAM (TEE);  Surgeon: Lewayne Bunting, MD;  Location: [facility] ENDOSCOPY;  Service: Cardiovascular;  Laterality: N/A;   TEE WITHOUT CARDIOVERSION N/A 08/18/2013   Procedure: TRANSESOPHAGEAL ECHOCARDIOGRAM (TEE);  Surgeon: Donato Schultz, MD;  Location: [facility] ENDOSCOPY;  Service: Cardiovascular;  Laterality: N/A;   TEE WITHOUT CARDIOVERSION N/A 10/27/2013   Procedure: TRANSESOPHAGEAL ECHOCARDIOGRAM (TEE);  Surgeon: Pricilla Riffle, MD;  Location: [facility] ENDOSCOPY;  Service: Cardiovascular;  Laterality: N/A;   TEE WITHOUT CARDIOVERSION N/A 11/04/2014   Procedure: TRANSESOPHAGEAL ECHOCARDIOGRAM (TEE);  Surgeon: Chrystie Nose, MD;  Location: [facility] ENDOSCOPY;  Service: Cardiovascular;  Laterality: N/A;     Prior to Admission medications   Medication Sig Start Date End Date Taking? Authorizing Provider  acetaminophen (TYLENOL) 500 MG tablet Take 1,000-2,000 mg by mouth every 6 (six) hours as needed for mild pain or headache.   Yes [provider]  atorvastatin (LIPITOR) 40 MG tablet TAKE 1 TABLET BY MOUTH EVERY DAY AT 6PM Patient taking differently: Take 40 mg by mouth every evening. 07/15/20  Yes Allred, Fayrene Fearing, MD  buPROPion (WELLBUTRIN XL) 150 MG 24 hr tablet Take 150 mg by mouth daily. 09/09/15  Yes [provider]  ELIQUIS 5 MG TABS tablet TAKE 1 TABLET BY MOUTH TWICE A DAY Patient taking differently: Take 5 mg by mouth 2 (two) times daily. 08/23/20  Yes Allred, Fayrene Fearing, MD  flecainide (TAMBOCOR) 100 MG tablet TAKE 1 TABLET BY MOUTH TWICE A DAY Patient taking differently: Take 100 mg by mouth 2 (two) times daily. 06/07/20  Yes Allred, Fayrene Fearing, MD  furosemide (LASIX) 20 MG tablet TAKE 1 TABLET  BY MOUTH EVERY OTHER DAY Patient taking differently: Take 20 mg by mouth every other day. 08/05/20  Yes Allred, Fayrene Fearing, MD  hydrOXYzine (ATARAX/VISTARIL) 25 MG tablet Take 25 mg by mouth at bedtime as needed for sleep.   Yes [provider]  lisinopril (ZESTRIL) 5 MG tablet TAKE 1 TABLET BY MOUTH EVERY DAY Patient taking differently: Take 5 mg by mouth daily. 11/20/19  Yes Allred, Fayrene Fearing, MD  metoprolol tartrate (LOPRESSOR) 25 MG tablet TAKE 0.5 TABLETS (12.5 MG TOTAL) BY MOUTH AS DIRECTED. Patient taking differently: Take 25 mg by mouth daily as needed (afib). 06/16/19  Yes Allred, Fayrene Fearing, MD  Multiple Vitamin (MULTIVITAMIN) tablet Take 1 tablet by mouth daily.   Yes [provider]  nystatin-triamcinolone (MYCOLOG II) cream Apply 1 application topically 2 (two) times daily as needed for rash. 09/09/20  Yes [provider]  potassium chloride (K-DUR) 10 MEQ tablet Take 1 tablet (10 mEq total) by mouth  every other day. When taking Lasix 10/02/18  Yes Allred, Fayrene Fearing, MD  pramipexole (MIRAPEX) 0.25 MG tablet Take 0.25 mg by mouth at bedtime.   Yes [provider]  promethazine (PHENERGAN) 25 MG suppository Place 25 mg rectally every 6 (six) hours as needed for nausea/vomiting. 09/12/20  Yes [provider]  Semaglutide-Weight Management (WEGOVY) 1.7 MG/0.75ML SOAJ Inject 1.7 mg into the skin every Saturday. 07/29/20  Yes [provider]  sertraline (ZOLOFT) 100 MG tablet Take 200 mg by mouth every morning.  07/04/13  Yes [provider]  VITAMIN D PO Take 1 tablet by mouth daily.   Yes [provider]    Inpatient Medications: Scheduled Meds:  apixaban  5 mg Oral BID   atorvastatin  40 mg Oral Daily   flecainide  100 mg Oral Once   flecainide  100 mg Oral BID   insulin aspart  0-6 Units Subcutaneous TID WC   lisinopril  5 mg Oral Daily   Continuous Infusions:  sodium chloride 10 mL/hr at 09/14/20 0329   diltiazem (CARDIZEM) infusion  12.5 mg/hr (09/14/20 1025)   promethazine (PHENERGAN) injection (IM or IVPB) 12.5 mg (09/14/20 1053)   PRN Meds: acetaminophen **OR** acetaminophen, metoprolol tartrate, promethazine (PHENERGAN) injection (IM or IVPB)  Allergies:    Allergies  Allergen Reactions   Azithromycin Diarrhea and Nausea And Vomiting   Tape Rash    Social History:   Social History   Socioeconomic History   Marital status: Married    Spouse name: Not on file   Number of children: Not on file   Years of education: Not on file   Highest education level: Not on file  Occupational History   Not on file  Tobacco Use   Smoking status: Never   Smokeless tobacco: Never  Vaping Use   Vaping Use: Never used  Substance and Sexual Activity   Alcohol use: Yes    Comment: occasional   Drug use: No   Sexual activity: Yes  Other Topics Concern   Not on file  Social History Narrative   Not on file   Social Determinants of Health   Financial Resource Strain: Not on file  Food Insecurity: Not on file  Transportation Needs: Not on file  Physical Activity: Not on file  Stress: Not on file  Social Connections: Not on file  Intimate Partner Violence: Not on file    Family History:   Family History  Problem Relation Age of Onset   Hypertension Mother    Lymphoma Mother    Hypertension Father    Family Status:  Family Status  Relation Name Status   Mother  Alive   Father  Alive    ROS:  Please see the history of present illness.  All other ROS reviewed and negative.     Physical Exam/Data:   Vitals:   09/14/20 0412 09/14/20 0800 09/14/20 0801 09/14/20 1230  BP: 110/73 (!) 95/57 106/64 119/70  Pulse: 80  70 68  Resp:      Temp: 98.1 F (36.7 C)  98.3 F (36.8 C) 97.6 F (36.4 C)  TempSrc: Oral  Oral Oral  SpO2:      Weight:      Height:        Intake/Output Summary (Last 24 hours) at 09/14/2020 1330 Last data filed at 09/14/2020 0449 Gross per 24 hour  Intake 1426.12 ml  Output 1 ml   Net 1425.12 ml   Filed Weights   09/13/20 1455  09/13/20 2240  Weight: 88 kg 87 kg   Body mass index is 30.94 kg/m.   General: Well developed, well nourished, NAD Neck: Negative for carotid bruits. No JVD Lungs:Clear to ausculation bilaterally. No wheezes, rales, or rhonchi. Breathing is unlabored. Cardiovascular: RRR with S1 S2. No murmurs Abdomen: Soft, non-tender, non-distended. No obvious abdominal masses. Extremities: No edema. Neuro: Alert and oriented. No focal deficits. No facial asymmetry. MAE spontaneously. Psych: Responds to questions appropriately with normal affect.    EKG:  The EKG was personally reviewed and demonstrates:  09/13/20 AFlutter/atrial fibrillation HR 152bpm  Telemetry:  Telemetry was personally reviewed and demonstrates:  09/14/20 NSR with HR 60's   Relevant CV Studies:  Echocardiogram 02/11/2018:  Study Conclusions   - Left ventricle: The cavity size was normal. Wall thickness was    normal. Systolic function was normal. The estimated ejection    fraction was in the range of 50% to 55%. Wall motion was normal;    there were no regional wall motion abnormalities.  - Left atrium: The atrium was mildly dilated.   Impressions:   - Low normal LV systolic function; mild LAE.   CATH:   Laboratory Data:  Chemistry Recent Labs  Lab 09/13/20 1530 09/14/20 0005  NA 140 139  K 3.6 3.7  CL 107 108  CO2 23 22  GLUCOSE 102* 84  BUN 17 15  CREATININE 0.78 0.81  CALCIUM 9.3 9.1  GFRNONAA >60 >60  ANIONGAP 10 9    Total Protein  Date Value Ref Range Status  09/13/2020 7.5 6.5 - 8.1 g/dL Final   Albumin  Date Value Ref Range Status  09/13/2020 4.6 3.5 - 5.0 g/dL Final   AST  Date Value Ref Range Status  09/13/2020 34 15 - 41 U/L Final   ALT  Date Value Ref Range Status  09/13/2020 28 0 - 44 U/L Final   Alkaline Phosphatase  Date Value Ref Range Status  09/13/2020 105 38 - 126 U/L Final   Total Bilirubin  Date Value Ref Range Status   09/13/2020 0.9 0.3 - 1.2 mg/dL Final   Hematology Recent Labs  Lab 09/13/20 1530  WBC 9.3  RBC 5.15*  HGB 15.6*  HCT 46.8*  MCV 90.9  MCH 30.3  MCHC 33.3  RDW 13.4  PLT 205   Cardiac EnzymesNo results for input(s): TROPONINI in the last 168 hours. No results for input(s): TROPIPOC in the last 168 hours.  BNPNo results for input(s): BNP, PROBNP in the last 168 hours.  DDimer No results for input(s): DDIMER in the last 168 hours. TSH:  Lab Results  Component Value Date   TSH 1.450 07/16/2013   Lipids: Lab Results  Component Value Date   CHOL 198 07/17/2013   HDL 59 07/17/2013   LDLCALC 125 (H) 07/17/2013   TRIG 69 07/17/2013   CHOLHDL 3.4 07/17/2013   HgbA1c: Lab Results  Component Value Date   HGBA1C 5.5 07/16/2013    Radiology/Studies:  No results found.  Assessment and Plan:   1. Paroxysmal Atrial fibrillation: -Pt presented after a several day hx of intractable nausea and vomiting felt to be due to increased dose of Wegovy. On presentation, she was found to be in AF with RVR. Due to vomiting, she missed several doses of both her flecainide and Eliquis. In the ED, she was treated with IVF hydration along with IV diltiazem. She was restarted on home Flecainide and ELiquis without problems. She has since converted to NSR with stable  rates. Would recommend continuing home Flecainide and discontinued Dilt -Continue Flecainide, Eliquis, PRN metoprolol    2. Hx of tachy-mediated cardiomyopathy/CHF with normalization: -Initially found to have reduced LVEF however this improved to 50-55% on last echocardiogram with restoration of NSR -Does not appear to be volume overloaded on exam today  -No change to therapy    3. Dehydration secondary to intractable N/V: -Improved with IVF -Felt to be secondary to [facility]   4. HLD: -Continue statin    For questions or updates, please contact [facility] Please consult [email or web address] for contact info under  Cardiology/STEMI.   Raliegh Ip NP-C [facility] Pager: [phone] 09/14/2020 1:30 PM  History and all data above reviewed.  Patient examined.  I agree with the findings as above.  Nausea and vomiting related to the [facility] as above.  This has since resolved.  She was incidentally found to be in atrial fibrillation.  She typically feels that she did not notice this.  She had not been able to take few doses of her flecainide and Eliquis but these have been restarted.  She was placed on IV Cardizem and subsequently did go back into normal sinus rhythm this morning.  She is otherwise been doing okay.  She says she rarely has bouts of fibrillation since her second ablation and while on the flecainide.  She denies any chest pressure, neck or arm discomfort.  She has no presyncope or syncope.  She has no shortness of breath, PND or orthopnea.  She said no weight gain or edema.  She has lost about 17 pounds with the [facility].  The patient exam reveals COR: Regular rate and rhythm, no murmurs,  Lungs: Clear to auscultation bilaterally.,  Abd: Positive bowel sounds normal in frequency pitch, no bruits, rebound, guarding, Ext 2+ pulses, no edema..  All available labs, radiology testing, previous records reviewed. Agree with documented assessment and plan.  Atrial fibrillation: Agree with restarting her previous dose of flecainide and Eliquis.  I will have the Cardizem stopped.  I think she could go home today.  I would not suggest any further work-up.  Fayrene Fearing Kensleigh Gates  3:27 PM  09/14/2020

## 2020-09-14 NOTE — Progress Notes (Signed)
Mobility Specialist: Progress Note   09/14/20 1613  Mobility  Activity Ambulated in hall  Level of Assistance Contact guard assist, steadying assist  Assistive Device None  Distance Ambulated (ft) 380 ft  Mobility Ambulated independently in hallway  Mobility Response Tolerated well  Mobility performed by Mobility specialist  $Mobility charge 1 Mobility   Pre-Mobility: 64 HR During Mobility: 79 HR Post-Mobility: 64 HR, 97% SpO2  Pt independent with bed mobility as well as to stand. Pt slightly unsteady during ambulation, otherwise asx. Pt back to bed after walk with pt's husband present in the room.   [facility] Charlesa Ehle Mobility Specialist Mobility Specialist Phone: [phone]

## 2020-09-14 NOTE — Progress Notes (Addendum)
PROGRESS NOTE    Kiara Kiara [name]  VQM:[id number] DOB: 1960/12/05 DOA: 09/13/2020 PCP: [name], MD   Chief Complaint  Patient presents with   Vomiting   Brief Narrative:  60 year old female with history of A. fib chronic systolic CHF, hyperlipidemia presents with persistent nausea vomiting that started after receiving a dose of Wegovy.  Patient was unable to take any of her oral medication.  She came to the ED was found to be in A. fib with RVR in 140-150 given IV fluids nausea medication.  Cardiology was consulted placed on Cardizem and flecainide resumed.  Subjective: Seen this morning she is on Cardizem drip heart rate is better controlled.  Still has some nausea mild headache.  No vomiting since admission.  Denies chest pain.  Assessment & Plan:  Atrial fibrillation with RVR: missed flecainide following nasuea vomitting.  Now on Cardizem drip.  Cardiology consulted continue flecainide. Of Cardizem.  Continue home Eliquis  Chronic systolic heart failure:Cont home lasix, lisinopril.  Currently euvolemic.   HLD on statin  Non-intractable vomiting-2/2 to [facility] injection.Cont phenergan.  On CLD. ADAT-stop ivf.  Check hba1c No results for input(s): GLUCAP in the last 168 hours.   Diet Order             Diet clear liquid Room service appropriate? Yes; Fluid consistency: Thin  Diet effective now                   Patient's Body mass index is 30.94 kg/m. DVT prophylaxis:  Code Status:   Code Status: Full Code ELIQUIS Family Communication: plan of care discussed with patient at bedside. Status is: Inpatient Remains inpatient appropriate because:IV treatments appropriate due to intensity of illness or inability to take PO and Inpatient level of care appropriate due to severity of illness Dispo: The patient is from: Home              Anticipated d/c is to: Home              Patient currently is not medically stable to d/c.   Difficult to place patient No Unresulted  Labs (From admission, onward)    None     Medications reviewed:  Scheduled Meds:  apixaban  5 mg Oral BID   atorvastatin  40 mg Oral Daily   flecainide  100 mg Oral Once   flecainide  100 mg Oral BID   lisinopril  5 mg Oral Daily   Continuous Infusions:  sodium chloride 10 mL/hr at 09/14/20 0329   diltiazem (CARDIZEM) infusion 12.5 mg/hr (09/14/20 1025)   lactated ringers 75 mL/hr at 09/14/20 0329   promethazine (PHENERGAN) injection (IM or IVPB) 12.5 mg (09/14/20 1053)   Consultants:see note  Procedures:see note Antimicrobials: Anti-infectives (From admission, onward)    None      Culture/Microbiology No results found for: SDES, SPECREQUEST, CULT, REPTSTATUS  Other culture-see note  Objective: Vitals: Today's Vitals   09/14/20 0412 09/14/20 0800 09/14/20 0801 09/14/20 1117  BP: 110/73 (!) 95/57 106/64   Pulse: 80  70   Resp:      Temp: 98.1 F (36.7 C)  98.3 F (36.8 C)   TempSrc: Oral  Oral   SpO2:      Weight:      Height:      PainSc: 6    3     Intake/Output Summary (Last 24 hours) at 09/14/2020 1207 Last data filed at 09/14/2020 0449 Gross per 24 hour  Intake 1426.12 ml  Output 1 ml  Net 1425.12 ml   Filed Weights   09/13/20 1455 09/13/20 2240  Weight: 88 kg 87 kg   Weight change:   Intake/Output from previous day: 08/01 0701 - 08/02 0700 In: 1426.1 [P.O.:120; I.V.:305.3; IV Piggyback:1000.9] Out: 1 [Urine:1] Intake/Output this shift: No intake/output data recorded. Filed Weights   09/13/20 1455 09/13/20 2240  Weight: 88 kg 87 kg   Examination: General exam: AAO 3, pleasant, not in acute distress.  HEENT:Oral mucosa moist, Ear/Nose WNL grossly,dentition normal. Respiratory system: bilaterally diminished,no use of accessory muscle, non tender. Cardiovascular system: S1 & S2 +, irregularly irregular no JVD. Gastrointestinal system: Abdomen soft, NT,ND, BS+. Nervous System:Alert, awake, moving extremities Extremities: NO edema, distal  peripheral pulses palpable.  Skin: No rashes,no icterus. MSK: Normal muscle bulk,tone, power Data Reviewed: I have personally reviewed following labs and imaging studies CBC: Recent Labs  Lab 09/13/20 1530  WBC 9.3  NEUTROABS 6.9  HGB 15.6*  HCT 46.8*  MCV 90.9  PLT 205   Basic Metabolic Panel: Recent Labs  Lab 09/13/20 1530 09/13/20 1546 09/14/20 0005  NA 140  --  139  K 3.6  --  3.7  CL 107  --  108  CO2 23  --  22  GLUCOSE 102*  --  84  BUN 17  --  15  CREATININE 0.78  --  0.81  CALCIUM 9.3  --  9.1  MG  --  2.1  --    GFR: Estimated Creatinine Clearance: 80 mL/min (by C-G formula based on SCr of 0.81 mg/dL). Liver Function Tests: Recent Labs  Lab 09/13/20 1530  AST 34  ALT 28  ALKPHOS 105  BILITOT 0.9  PROT 7.5  ALBUMIN 4.6   No results for input(s): LIPASE, AMYLASE in the last 168 hours. No results for input(s): AMMONIA in the last 168 hours. Coagulation Profile: No results for input(s): INR, PROTIME in the last 168 hours. Cardiac Enzymes: No results for input(s): CKTOTAL, CKMB, CKMBINDEX, TROPONINI in the last 168 hours. BNP (last 3 results) No results for input(s): PROBNP in the last 8760 hours. HbA1C: No results for input(s): HGBA1C in the last 72 hours. CBG: No results for input(s): GLUCAP in the last 168 hours. Lipid Profile: No results for input(s): CHOL, HDL, LDLCALC, TRIG, CHOLHDL, LDLDIRECT in the last 72 hours. Thyroid Function Tests: No results for input(s): TSH, T4TOTAL, FREET4, T3FREE, THYROIDAB in the last 72 hours. Anemia Panel: No results for input(s): VITAMINB12, FOLATE, FERRITIN, TIBC, IRON, RETICCTPCT in the last 72 hours. Sepsis Labs: No results for input(s): PROCALCITON, LATICACIDVEN in the last 168 hours.  Recent Results (from the past 240 hour(s))  Resp Panel by RT-PCR (Flu A&B, Covid) Nasopharyngeal Swab     Status: None   Collection Time: 09/13/20  3:30 PM   Specimen: Nasopharyngeal Swab; Nasopharyngeal(NP) swabs in  vial transport medium  Result Value Ref Range Status   SARS Coronavirus 2 by RT PCR NEGATIVE NEGATIVE Final    Comment: (NOTE) SARS-CoV-2 target nucleic acids are NOT DETECTED.  The SARS-CoV-2 RNA is generally detectable in upper respiratory specimens during the acute phase of infection. The lowest concentration of SARS-CoV-2 viral copies this assay can detect is 138 copies/mL. A negative result does not preclude SARS-Cov-2 infection and should not be used as the sole basis for treatment or other patient management decisions. A negative result may occur with  improper specimen collection/handling, submission of specimen other than nasopharyngeal swab, presence of viral mutation(s) within the areas  targeted by this assay, and inadequate number of viral copies(<138 copies/mL). A negative result must be combined with clinical observations, patient history, and epidemiological information. The expected result is Negative.  Fact Sheet for Patients:  BloggerCourse.com  Fact Sheet for Healthcare Providers:  SeriousBroker.it  This test is no t yet approved or cleared by the [place] FDA and  has been authorized for detection and/or diagnosis of SARS-CoV-2 by FDA under an Emergency Use Authorization (EUA). This EUA will remain  in effect (meaning this test can be used) for the duration of the COVID-19 declaration under Section 564(b)(1) of the Act, 21 U.S.C.section 360bbb-3(b)(1), unless the authorization is terminated  or revoked sooner.       Influenza A by PCR NEGATIVE NEGATIVE Final   Influenza B by PCR NEGATIVE NEGATIVE Final    Comment: (NOTE) The Xpert Xpress SARS-CoV-2/FLU/RSV plus assay is intended as an aid in the diagnosis of influenza from Nasopharyngeal swab specimens and should not be used as a sole basis for treatment. Nasal washings and aspirates are unacceptable for Xpert Xpress SARS-CoV-2/FLU/RSV testing.  Fact  Sheet for Patients: BloggerCourse.com  Fact Sheet for Healthcare Providers: SeriousBroker.it  This test is not yet approved or cleared by the [place] FDA and has been authorized for detection and/or diagnosis of SARS-CoV-2 by FDA under an Emergency Use Authorization (EUA). This EUA will remain in effect (meaning this test can be used) for the duration of the COVID-19 declaration under Section 564(b)(1) of the Act, 21 U.S.C. section 360bbb-3(b)(1), unless the authorization is terminated or revoked.  Performed at [facility], [address] [place], [place] [address]      Radiology Studies: No results found.   LOS: 1 day   Lanae Boast, MD [facility]  09/14/2020, 12:07 PM

## 2020-09-14 NOTE — Telephone Encounter (Signed)
Husband is calling to report he took Kiara [name] to [facility] ER and after she was stabilized she was taken to [facility] hospital and is on the heart floor. Started a Cardizem drip. Had a elevated HR and Afib, was unable to stabilize. This is the reason for admission. Believes this was due to her vomiting and not being able to keep down meds for two days. This is also the reason she became dehydrated. Just wanted to make Dr. Johney Frame aware. States he spoke with a PA from our offices yesterday after 5:00 that talked with the Dr on call in regards to this and they are who suggested the Cardizem drip and transport.

## 2020-09-14 NOTE — Discharge Summary (Addendum)
Physician Discharge Summary  Kiara [name] OIZ:[id number] DOB: 1960-04-26 DOA: 09/13/2020  PCP: [name], MD  Admit date: 09/13/2020 Discharge date: 09/14/2020  Admitted From: home Disposition:  hom  Recommendations for Outpatient Follow-up:  Follow up with PCP and cardiology in 1-2 weeks Please obtain BMP/CBC in one week  Home Health:no  Equipment/Devices: none  Discharge Condition: Stable Code Status:   Code Status: Prior Diet recommendation:  Diet Order             Diet - low sodium heart healthy                    Brief/Interim Summary: 60 year old female with history of A. fib chronic systolic CHF, hyperlipidemia presents with persistent nausea vomiting that started after receiving a dose of Wegovy.  Patient was unable to take any of her oral medication.  She came to the ED was found to be in A. fib with RVR in 140-150 given IV fluids nausea medication.  Cardiology was consulted placed on Cardizem and flecainide resumed, seen by cardiology and subsequently Cardizem discontinued.  Cleared for discharge home by cardiology. She had nausea vomiting but able to tolerate diet prior to discharge home.  She was noted to go home Dr Jodene Nam and discharged home  Discharge Diagnoses:   Atrial fibrillation with RVR: missed flecainide following nasuea vomitting.  Treated with Cardizem drip.  Now heart rate is stable back on flecainide, Eliquis off Cardizem drip.  Cardiology signed off.  Chronic systolic heart failure:Cont home lasix, lisinopril.  Currently euvolemic.  HLD on statin Non-intractable vomiting-2/2 to [facility] injection.she has had good weight loss from the injection.  Cont phenergan.  Diet was advanced able to tolerate regular diet prior to discharge home. Check hba1c Lab Results  Component Value Date   HGBA1C 5.4 09/14/2020    Consults: cardiology  Subjective: Alert awake oriented, not in acute distress tolerated diet and wanted to go home in the  evening. Discharge Exam: Vitals:   09/14/20 1230 09/14/20 1700  BP: 119/70 (!) 141/71  Pulse: 68 63  Resp:    Temp: 97.6 F (36.4 C) 97.8 F (36.6 C)  SpO2:     General: Pt is alert, awake, not in acute distress Cardiovascular: RRR, S1/S2 +, no rubs, no gallops Respiratory: CTA bilaterally, no wheezing, no rhonchi Abdominal: Soft, NT, ND, bowel sounds + Extremities: no edema, no cyanosis  Discharge Instructions  Discharge Instructions     Diet - low sodium heart healthy   Complete by: As directed    Discharge instructions   Complete by: As directed    Please call call MD or return to ER for similar or worsening recurring problem that brought you to hospital or if any fever,nausea/vomiting,abdominal pain, uncontrolled pain, chest pain,  shortness of breath or any other alarming symptoms.  Please follow-up your doctor as instructed in a week time and call the office for appointment.  Please avoid alcohol, smoking, or any other illicit substance and maintain healthy habits including taking your regular medications as prescribed.  You were cared for by a hospitalist during your hospital stay. If you have any questions about your discharge medications or the care you received while you were in the hospital after you are discharged, you can call the unit and ask to speak with the hospitalist on call if the hospitalist that took care of you is not available.  Once you are discharged, your primary care physician will handle any further medical issues. Please note  that NO REFILLS for any discharge medications will be authorized once you are discharged, as it is imperative that you return to your primary care physician (or establish a relationship with a primary care physician if you do not have one) for your aftercare needs so that they can reassess your need for medications and monitor your lab values   Increase activity slowly   Complete by: As directed       Allergies as of 09/14/2020        Reactions   Azithromycin Diarrhea, Nausea And Vomiting   Tape Rash        Medication List     TAKE these medications    acetaminophen 500 MG tablet Commonly known as: TYLENOL Take 1,000-2,000 mg by mouth every 6 (six) hours as needed for mild pain or headache.   buPROPion 150 MG 24 hr tablet Commonly known as: WELLBUTRIN XL Take 150 mg by mouth daily.   hydrOXYzine 25 MG tablet Commonly known as: ATARAX/VISTARIL Take 25 mg by mouth at bedtime as needed for sleep.   multivitamin tablet Take 1 tablet by mouth daily.   nystatin-triamcinolone cream Commonly known as: MYCOLOG II Apply 1 application topically 2 (two) times daily as needed for rash.   potassium chloride 10 MEQ tablet Commonly known as: KLOR-CON Take 1 tablet (10 mEq total) by mouth every other day. When taking Lasix   pramipexole 0.25 MG tablet Commonly known as: MIRAPEX Take 0.25 mg by mouth at bedtime.   promethazine 25 MG suppository Commonly known as: PHENERGAN Place 25 mg rectally every 6 (six) hours as needed for nausea/vomiting.   sertraline 100 MG tablet Commonly known as: ZOLOFT Take 200 mg by mouth every morning.   VITAMIN D PO Take 1 tablet by mouth daily.   Wegovy 1.7 MG/0.75ML Soaj Generic drug: Semaglutide-Weight Management Inject 1.7 mg into the skin every Saturday.       ASK your doctor about these medications    atorvastatin 40 MG tablet Commonly known as: LIPITOR TAKE 1 TABLET BY MOUTH EVERY DAY AT 6PM   Eliquis 5 MG Tabs tablet Generic drug: apixaban TAKE 1 TABLET BY MOUTH TWICE A DAY   flecainide 100 MG tablet Commonly known as: TAMBOCOR TAKE 1 TABLET BY MOUTH TWICE A DAY   furosemide 20 MG tablet Commonly known as: LASIX TAKE 1 TABLET BY MOUTH EVERY OTHER DAY   lisinopril 5 MG tablet Commonly known as: ZESTRIL TAKE 1 TABLET BY MOUTH EVERY DAY   metoprolol tartrate 25 MG tablet Commonly known as: LOPRESSOR TAKE 0.5 TABLETS (12.5 MG TOTAL) BY MOUTH  AS DIRECTED.        Follow-up Information     Hillis RangeAllred, James, MD Follow up on 09/22/2020.   Specialty: Cardiology Why: at 800am Contact information: [address] [place] [id number] [phone]                Allergies  Allergen Reactions   Azithromycin Diarrhea and Nausea And Vomiting   Tape Rash    The results of significant diagnostics from this hospitalization (including imaging, microbiology, ancillary and laboratory) are listed below for reference.    Microbiology: Recent Results (from the past 240 hour(s))  Resp Panel by RT-PCR (Flu A&B, Covid) Nasopharyngeal Swab     Status: None   Collection Time: 09/13/20  3:30 PM   Specimen: Nasopharyngeal Swab; Nasopharyngeal(NP) swabs in vial transport medium  Result Value Ref Range Status   SARS Coronavirus 2 by RT PCR  NEGATIVE NEGATIVE Final    Comment: (NOTE) SARS-CoV-2 target nucleic acids are NOT DETECTED.  The SARS-CoV-2 RNA is generally detectable in upper respiratory specimens during the acute phase of infection. The lowest concentration of SARS-CoV-2 viral copies this assay can detect is 138 copies/mL. A negative result does not preclude SARS-Cov-2 infection and should not be used as the sole basis for treatment or other patient management decisions. A negative result may occur with  improper specimen collection/handling, submission of specimen other than nasopharyngeal swab, presence of viral mutation(s) within the areas targeted by this assay, and inadequate number of viral copies(<138 copies/mL). A negative result must be combined with clinical observations, patient history, and epidemiological information. The expected result is Negative.  Fact Sheet for Patients:  BloggerCourse.com  Fact Sheet for Healthcare Providers:  SeriousBroker.it  This test is no t yet approved or cleared by the [place] FDA and  has been authorized for  detection and/or diagnosis of SARS-CoV-2 by FDA under an Emergency Use Authorization (EUA). This EUA will remain  in effect (meaning this test can be used) for the duration of the COVID-19 declaration under Section 564(b)(1) of the Act, 21 U.S.C.section 360bbb-3(b)(1), unless the authorization is terminated  or revoked sooner.       Influenza A by PCR NEGATIVE NEGATIVE Final   Influenza B by PCR NEGATIVE NEGATIVE Final    Comment: (NOTE) The Xpert Xpress SARS-CoV-2/FLU/RSV plus assay is intended as an aid in the diagnosis of influenza from Nasopharyngeal swab specimens and should not be used as a sole basis for treatment. Nasal washings and aspirates are unacceptable for Xpert Xpress SARS-CoV-2/FLU/RSV testing.  Fact Sheet for Patients: BloggerCourse.com  Fact Sheet for Healthcare Providers: SeriousBroker.it  This test is not yet approved or cleared by the [place] FDA and has been authorized for detection and/or diagnosis of SARS-CoV-2 by FDA under an Emergency Use Authorization (EUA). This EUA will remain in effect (meaning this test can be used) for the duration of the COVID-19 declaration under Section 564(b)(1) of the Act, 21 U.S.C. section 360bbb-3(b)(1), unless the authorization is terminated or revoked.  Performed at [facility], [address] [place], [place] [address]     Procedures/Studies: No results found.  Labs: BNP (last 3 results) No results for input(s): BNP in the last 8760 hours. Basic Metabolic Panel: Recent Labs  Lab 09/13/20 1530 09/13/20 1546 09/14/20 0005  NA 140  --  139  K 3.6  --  3.7  CL 107  --  108  CO2 23  --  22  GLUCOSE 102*  --  84  BUN 17  --  15  CREATININE 0.78  --  0.81  CALCIUM 9.3  --  9.1  MG  --  2.1  --    Liver Function Tests: Recent Labs  Lab 09/13/20 1530  AST 34  ALT 28  ALKPHOS 105  BILITOT 0.9  PROT 7.5  ALBUMIN 4.6   No results for  input(s): LIPASE, AMYLASE in the last 168 hours. No results for input(s): AMMONIA in the last 168 hours. CBC: Recent Labs  Lab 09/13/20 1530  WBC 9.3  NEUTROABS 6.9  HGB 15.6*  HCT 46.8*  MCV 90.9  PLT 205   Cardiac Enzymes: No results for input(s): CKTOTAL, CKMB, CKMBINDEX, TROPONINI in the last 168 hours. BNP: Invalid input(s): POCBNP CBG: No results for input(s): GLUCAP in the last 168 hours. D-Dimer No results for input(s): DDIMER in the last 72 hours.  Hgb A1c Recent Labs    09/14/20 0311  HGBA1C 5.4   Lipid Profile No results for input(s): CHOL, HDL, LDLCALC, TRIG, CHOLHDL, LDLDIRECT in the last 72 hours. Thyroid function studies No results for input(s): TSH, T4TOTAL, T3FREE, THYROIDAB in the last 72 hours.  Invalid input(s): FREET3 Anemia work up No results for input(s): VITAMINB12, FOLATE, FERRITIN, TIBC, IRON, RETICCTPCT in the last 72 hours. Urinalysis    Component Value Date/Time   COLORURINE YELLOW 09/14/2020 0501   APPEARANCEUR HAZY (A) 09/14/2020 0501   LABSPEC 1.020 09/14/2020 0501   PHURINE 6.0 09/14/2020 0501   GLUCOSEU NEGATIVE 09/14/2020 0501   HGBUR NEGATIVE 09/14/2020 0501   BILIRUBINUR NEGATIVE 09/14/2020 0501   KETONESUR 5 (A) 09/14/2020 0501   PROTEINUR NEGATIVE 09/14/2020 0501   NITRITE NEGATIVE 09/14/2020 0501   LEUKOCYTESUR TRACE (A) 09/14/2020 0501   Sepsis Labs Invalid input(s): PROCALCITONIN,  WBC,  LACTICIDVEN Microbiology Recent Results (from the past 240 hour(s))  Resp Panel by RT-PCR (Flu A&B, Covid) Nasopharyngeal Swab     Status: None   Collection Time: 09/13/20  3:30 PM   Specimen: Nasopharyngeal Swab; Nasopharyngeal(NP) swabs in vial transport medium  Result Value Ref Range Status   SARS Coronavirus 2 by RT PCR NEGATIVE NEGATIVE Final    Comment: (NOTE) SARS-CoV-2 target nucleic acids are NOT DETECTED.  The SARS-CoV-2 RNA is generally detectable in upper respiratory specimens during the acute phase of infection. The  lowest concentration of SARS-CoV-2 viral copies this assay can detect is 138 copies/mL. A negative result does not preclude SARS-Cov-2 infection and should not be used as the sole basis for treatment or other patient management decisions. A negative result may occur with  improper specimen collection/handling, submission of specimen other than nasopharyngeal swab, presence of viral mutation(s) within the areas targeted by this assay, and inadequate number of viral copies(<138 copies/mL). A negative result must be combined with clinical observations, patient history, and epidemiological information. The expected result is Negative.  Fact Sheet for Patients:  BloggerCourse.com  Fact Sheet for Healthcare Providers:  SeriousBroker.it  This test is no t yet approved or cleared by the [place] FDA and  has been authorized for detection and/or diagnosis of SARS-CoV-2 by FDA under an Emergency Use Authorization (EUA). This EUA will remain  in effect (meaning this test can be used) for the duration of the COVID-19 declaration under Section 564(b)(1) of the Act, 21 U.S.C.section 360bbb-3(b)(1), unless the authorization is terminated  or revoked sooner.       Influenza A by PCR NEGATIVE NEGATIVE Final   Influenza B by PCR NEGATIVE NEGATIVE Final    Comment: (NOTE) The Xpert Xpress SARS-CoV-2/FLU/RSV plus assay is intended as an aid in the diagnosis of influenza from Nasopharyngeal swab specimens and should not be used as a sole basis for treatment. Nasal washings and aspirates are unacceptable for Xpert Xpress SARS-CoV-2/FLU/RSV testing.  Fact Sheet for Patients: BloggerCourse.com  Fact Sheet for Healthcare Providers: SeriousBroker.it  This test is not yet approved or cleared by the [place] FDA and has been authorized for detection and/or diagnosis of SARS-CoV-2 by FDA under  an Emergency Use Authorization (EUA). This EUA will remain in effect (meaning this test can be used) for the duration of the COVID-19 declaration under Section 564(b)(1) of the Act, 21 U.S.C. section 360bbb-3(b)(1), unless the authorization is terminated or revoked.  Performed at [facility], [address] [place], [place] [address]      Time coordinating discharge: 51  minutes  SIGNED: Lanae Boast, MD  [facility] 09/15/2020, 3:39 PM  If 7PM-7AM, please contact night-coverage [email or web address]

## 2020-09-22 ENCOUNTER — Other Ambulatory Visit: Payer: Self-pay

## 2020-09-22 ENCOUNTER — Ambulatory Visit: Payer: BC Managed Care – PPO | Admitting: Internal Medicine

## 2020-09-22 ENCOUNTER — Encounter: Payer: Self-pay | Admitting: Internal Medicine

## 2020-09-22 ENCOUNTER — Telehealth: Payer: Self-pay | Admitting: Internal Medicine

## 2020-09-22 VITALS — BP 126/80 | HR 120 | Ht 66.0 in | Wt 190.0 lb

## 2020-09-22 DIAGNOSIS — I480 Paroxysmal atrial fibrillation: Secondary | ICD-10-CM

## 2020-09-22 DIAGNOSIS — D6869 Other thrombophilia: Secondary | ICD-10-CM

## 2020-09-22 DIAGNOSIS — I43 Cardiomyopathy in diseases classified elsewhere: Secondary | ICD-10-CM

## 2020-09-22 DIAGNOSIS — R000 Tachycardia, unspecified: Secondary | ICD-10-CM

## 2020-09-22 DIAGNOSIS — E782 Mixed hyperlipidemia: Secondary | ICD-10-CM | POA: Diagnosis not present

## 2020-09-22 NOTE — Telephone Encounter (Signed)
Patient stated that she is saying yes to the ablation and its okay to call and schedule. Please advise

## 2020-09-22 NOTE — Progress Notes (Signed)
PCP: [name], MD   Primary EP: Dr Johney Frame  Kiara [name] is a 60 y.o. female who presents today for routine electrophysiology followup.  Since last being seen in our clinic, the patient reports doing reasonably well.  She has had increasing frequency and duration of her afib.  She was recently seen in the ER (notes reviewed).  + afib today.  + palpitations and fatigue.  Today, she denies symptoms of chest pain, shortness of breath,  lower extremity edema, dizziness, presyncope, or syncope.  The patient is otherwise without complaint today.   Past Medical History:  Diagnosis Date   Anxiety    Asthma    Atrial fibrillation (HCC)    a. admx with AFib with RVR and acute systolic CHF 07/2013; TEE with LAA clot and no DCCV done;     Coronary artery disease    a.  LHC (07/17/13):  LM 20%, ostial CFX 20-30%   Depression    History of kidney stones    Hyperlipemia    Hypothyroidism    past hx   Nephrolithiasis 2013   "passed them"   NICM (nonischemic cardiomyopathy) (HCC)    a. Echo (07/17/13):  EF 25-30%, EF subsequently normalized with sinus rhythm (tachycardia mediated CM)   Obesity    Osteopenia    Past Surgical History:  Procedure Laterality Date   ABDOMINAL HYSTERECTOMY  ~ 2001   ABLATION  10/28/13   PVI by Dr Johney Frame   APPENDECTOMY  1978   ATRIAL FIBRILLATION ABLATION N/A 10/28/2013   Procedure: ATRIAL FIBRILLATION ABLATION;  Surgeon: Gardiner Rhyme, MD;  Location: [facility] CATH LAB;  Service: Cardiovascular;  Laterality: N/A;   BACK SURGERY Bilateral 2019   Fusion   CARDIAC CATHETERIZATION  07/2013   CARDIOVERSION N/A 08/18/2013   Procedure: CARDIOVERSION;  Surgeon: Donato Schultz, MD;  Location: [facility] ENDOSCOPY;  Service: Cardiovascular;  Laterality: N/A;   CARDIOVERSION N/A 08/29/2013   Procedure: CARDIOVERSION;  Surgeon: Pricilla Riffle, MD;  Location: [facility] OR;  Service: Cardiovascular;  Laterality: N/A;   ELECTROPHYSIOLOGIC STUDY N/A 11/05/2014   Procedure: Atrial Fibrillation Ablation;   Surgeon: Hillis Range, MD;  Location: [facility] INVASIVE CV LAB;  Service: Cardiovascular;  Laterality: N/A;   EYE SURGERY Bilateral 1999   Corrective laser surgery   implantable loop recorder removal  10/02/2018   MDT Reveal LINQ removed in office by Dr Johney Frame for EOL battery   IR SACROPLASTY BILATERAL  02/06/2019   LEFT HEART CATHETERIZATION WITH CORONARY ANGIOGRAM N/A 07/17/2013   Procedure: LEFT HEART CATHETERIZATION WITH CORONARY ANGIOGRAM;  Surgeon: Micheline Chapman, MD;  Location: [facility] CATH LAB;  Service: Cardiovascular;  Laterality: N/A;   LOOP RECORDER IMPLANT N/A 06/05/2014   Procedure: LOOP RECORDER IMPLANT;  Surgeon: Hillis Range, MD;  Location: [facility] CATH LAB;  Service: Cardiovascular;  Laterality: N/A;   TEE WITHOUT CARDIOVERSION N/A 07/18/2013   Procedure: TRANSESOPHAGEAL ECHOCARDIOGRAM (TEE);  Surgeon: Lewayne Bunting, MD;  Location: [facility] ENDOSCOPY;  Service: Cardiovascular;  Laterality: N/A;   TEE WITHOUT CARDIOVERSION N/A 08/18/2013   Procedure: TRANSESOPHAGEAL ECHOCARDIOGRAM (TEE);  Surgeon: Donato Schultz, MD;  Location: [facility] ENDOSCOPY;  Service: Cardiovascular;  Laterality: N/A;   TEE WITHOUT CARDIOVERSION N/A 10/27/2013   Procedure: TRANSESOPHAGEAL ECHOCARDIOGRAM (TEE);  Surgeon: Pricilla Riffle, MD;  Location: [facility] ENDOSCOPY;  Service: Cardiovascular;  Laterality: N/A;   TEE WITHOUT CARDIOVERSION N/A 11/04/2014   Procedure: TRANSESOPHAGEAL ECHOCARDIOGRAM (TEE);  Surgeon: Chrystie Nose, MD;  Location: [facility] ENDOSCOPY;  Service: Cardiovascular;  Laterality: N/A;  ROS- all systems are reviewed and negatives except as per HPI above  Current Outpatient Medications  Medication Sig Dispense Refill   acetaminophen (TYLENOL) 500 MG tablet Take 1,000-2,000 mg by mouth every 6 (six) hours as needed for mild pain or headache.     atorvastatin (LIPITOR) 40 MG tablet TAKE 1 TABLET BY MOUTH EVERY DAY AT 6PM 90 tablet 2   buPROPion (WELLBUTRIN XL) 150 MG 24 hr tablet Take 150 mg by mouth daily.     ELIQUIS 5 MG  TABS tablet TAKE 1 TABLET BY MOUTH TWICE A DAY 180 tablet 1   flecainide (TAMBOCOR) 100 MG tablet TAKE 1 TABLET BY MOUTH TWICE A DAY 180 tablet 1   furosemide (LASIX) 20 MG tablet TAKE 1 TABLET BY MOUTH EVERY OTHER DAY 90 tablet 1   hydrOXYzine (ATARAX/VISTARIL) 25 MG tablet Take 25 mg by mouth at bedtime as needed for sleep.     lisinopril (ZESTRIL) 5 MG tablet TAKE 1 TABLET BY MOUTH EVERY DAY 90 tablet 3   metoprolol tartrate (LOPRESSOR) 25 MG tablet TAKE 0.5 TABLETS (12.5 MG TOTAL) BY MOUTH AS DIRECTED. (Patient taking differently: Take 25 mg by mouth daily as needed (afib).) 45 tablet 3   Multiple Vitamin (MULTIVITAMIN) tablet Take 1 tablet by mouth daily.     nystatin-triamcinolone (MYCOLOG II) cream Apply 1 application topically 2 (two) times daily as needed for rash.     potassium chloride (K-DUR) 10 MEQ tablet Take 1 tablet (10 mEq total) by mouth every other day. When taking Lasix 45 tablet 3   pramipexole (MIRAPEX) 0.25 MG tablet Take 0.25 mg by mouth at bedtime.     promethazine (PHENERGAN) 25 MG suppository Place 25 mg rectally every 6 (six) hours as needed for nausea/vomiting.     sertraline (ZOLOFT) 100 MG tablet Take 200 mg by mouth every morning.      VITAMIN D PO Take 1 tablet by mouth daily.     Semaglutide-Weight Management (WEGOVY) 1.7 MG/0.75ML SOAJ Inject 1.7 mg into the skin every Saturday. (Patient not taking: Reported on 09/22/2020)     No current facility-administered medications for this visit.   Facility-Administered Medications Ordered in Other Visits  Medication Dose Route Frequency Provider Last Rate Last Admin   0.9 %  sodium chloride infusion   Intravenous Continuous Ralene Muskrat, PA-C       0.9 %  sodium chloride infusion   Intravenous Continuous Ralene Muskrat, PA-C        Physical Exam: Vitals:   09/22/20 0804  BP: 126/80  Pulse: (!) 120  SpO2: 96%  Weight: 190 lb (86.2 kg)  Height: 5\' 6"  (1.676 m)    GEN- The patient is well appearing, alert and  oriented x 3 today.   Head- normocephalic, atraumatic Eyes-  Sclera clear, conjunctiva pink Ears- hearing intact Oropharynx- clear Lungs- Clear to ausculation bilaterally, normal work of breathing Heart- tachycardic irregular rhythm GI- soft, NT, ND, + BS Extremities- no clubbing, cyanosis, or edema  Wt Readings from Last 3 Encounters:  09/22/20 190 lb (86.2 kg)  09/13/20 191 lb 11.2 oz (87 kg)  01/01/20 206 lb 3.2 oz (93.5 kg)    EKG tracing ordered today is personally reviewed and shows afib with RVR  Assessment and Plan:  Paroxysmal atrial fibrillation Chads2vasc score is 3.  She is on eliquis The patient has symptomatic, recurrent  atrial fibrillation.  She is s/p ablation x 2 she has failed medical therapy with flecainide. Chads2vasc score is 3.  she is anticoagulated with eliquis . Therapeutic strategies for afib including medicine (tikosyn and amiodarone) and ablation were discussed in detail with the patient today. She has failed tikosyn previously and is not excited about this medicine.  Risk, benefits, and alternatives to EP study and radiofrequency ablation for afib were also discussed in detail today. These risks include but are not limited to stroke, bleeding, vascular damage, tamponade, perforation, damage to the esophagus, lungs, and other structures, pulmonary vein stenosis, worsening renal function, and death. The patient understands these risk and wishes to think about this further.  She will contact our office if she decides to proceed.  She will need cardiac CT prior to the procedure.  Echo is ordered today She will titrate metoprolol at home to keep heart rates < 100 bpm  2. Prior tachycardia mediated CM Repeat echo as above   Risks, benefits and potential toxicities for medications prescribed and/or refilled reviewed with patient today.   She has afib with RVR refractory to medicine.  She is at risk for decompensation/ hospitalization.  A high level of  decision making was required for the visit today.  Hopefully she will decide to proceed with ablation.  Follow-up in 3 weeks in the AF clinic  Hillis Range MD, [facility] 09/22/2020 8:50 AM

## 2020-09-22 NOTE — Patient Instructions (Addendum)
Medication Instructions:  Your physician recommends that you continue on your current medications as directed. Please refer to the Current Medication list given to you today.  Labwork: None ordered.  Testing/Procedures: Your physician has requested that you have an echocardiogram. Echocardiography is a painless test that uses sound waves to create images of your heart. It provides your doctor with information about the size and shape of your heart and how well your heart's chambers and valves are working. This procedure takes approximately one hour. There are no restrictions for this procedure.   Follow-Up: Your physician wants you to follow-up in: 3 weeks with the Afib Clinic. They will contact you to schedule. Dr. Jenel Lucks Nurse Inetta Fermo [phone] AFIB CLINIC INFORMATION:  The AFib Clinic is located in the Heart and Vascular Specialty Clinics at [facility]. Parking instructions/directions: [address] ([address]). When you pull in to Entrance C, there is an underground parking garage to your right. The code to enter the garage is 5544. Take the elevators to the first floor. Follow the signs to the Heart and Vascular Specialty Clinics. You will see registration at the end of the hallway.  Phone number: [phone]     Any Other Special Instructions Will Be Listed Below (If Applicable).  If you need a refill on your cardiac medications before your next appointment, please call your pharmacy.   Your physician has recommended that you have an ablation. Catheter ablation is a medical procedure used to treat some cardiac arrhythmias (irregular heartbeats). During catheter ablation, a long, thin, flexible tube is put into a blood vessel in your groin (upper thigh), or neck. This tube is called an ablation catheter. It is then guided to your heart through the blood vessel. Radio frequency waves destroy small areas of heart tissue where abnormal heartbeats may cause an arrhythmia  to start. Please see the instruction sheet given to you today.   Cardiac Ablation Cardiac ablation is a procedure to destroy (ablate) some heart tissue that is sending bad signals. These bad signals causeproblems in heart rhythm. The heart has many areas that make these signals. If there are problems in these areas, they can make the heart beat in a way that is not normal.Destroying some tissues can help make the heart rhythm normal. Tell your doctor about: Any allergies you have. All medicines you are taking. These include vitamins, herbs, eye drops, creams, and over-the-counter medicines. Any problems you or family members have had with medicines that make you fall asleep (anesthetics). Any blood disorders you have. Any surgeries you have had. Any medical conditions you have, such as kidney failure. Whether you are pregnant or may be pregnant. What are the risks? This is a safe procedure. But problems may occur, including: Infection. Bruising and bleeding. Bleeding into the chest. Stroke or blood clots. Damage to nearby areas of your body. Allergies to medicines or dyes. The need for a pacemaker if the normal system is damaged. Failure of the procedure to treat the problem. What happens before the procedure? Medicines Ask your doctor about: Changing or stopping your normal medicines. This is important. Taking aspirin and ibuprofen. Do not take these medicines unless your doctor tells you to take them. Taking other medicines, vitamins, herbs, and supplements. General instructions Follow instructions from your doctor about what you cannot eat or drink. Plan to have someone take you home from the hospital or clinic. If you will be going home right after the procedure, plan to have someone with you  for 24 hours. Ask your doctor what steps will be taken to prevent infection. What happens during the procedure?  An IV tube will be put into one of your veins. You will be given a  medicine to help you relax. The skin on your neck or groin will be numbed. A cut (incision) will be made in your neck or groin. A needle will be put through your cut and into a large vein. A tube (catheter) will be put into the needle. The tube will be moved to your heart. Dye may be put through the tube. This helps your doctor see your heart. Small devices (electrodes) on the tube will send out signals. A type of energy will be used to destroy some heart tissue. The tube will be taken out. Pressure will be held on your cut. This helps stop bleeding. A bandage will be put over your cut. The exact procedure may vary among doctors and hospitals. What happens after the procedure? You will be watched until you leave the hospital or clinic. This includes checking your heart rate, breathing rate, oxygen, and blood pressure. Your cut will be watched for bleeding. You will need to lie still for a few hours. Do not drive for 24 hours or as long as your doctor tells you. Summary Cardiac ablation is a procedure to destroy some heart tissue. This is done to treat heart rhythm problems. Tell your doctor about any medical conditions you may have. Tell him or her about all medicines you are taking to treat them. This is a safe procedure. But problems may occur. These include infection, bruising, bleeding, and damage to nearby areas of your body. Follow what your doctor tells you about food and drink. You may also be told to change or stop some of your medicines. After the procedure, do not drive for 24 hours or as long as your doctor tells you. This information is not intended to replace advice given to you by your health care provider. Make sure you discuss any questions you have with your healthcare provider. Document Revised: 01/02/2019 Document Reviewed: 01/02/2019 Elsevier Patient Education  2022 [employer].

## 2020-09-23 NOTE — Telephone Encounter (Signed)
Picked ablation date and scheduled pre op labs. Will call patient with instructions next week. Patient in agreement

## 2020-09-28 ENCOUNTER — Encounter: Payer: Self-pay | Admitting: *Deleted

## 2020-09-29 NOTE — Telephone Encounter (Signed)
Sent instructions over mychart and discussed/answered all questions.  Verbalized understanding.

## 2020-10-08 ENCOUNTER — Other Ambulatory Visit: Payer: BC Managed Care – PPO

## 2020-10-12 ENCOUNTER — Ambulatory Visit (HOSPITAL_COMMUNITY): Payer: BC Managed Care – PPO | Attending: Internal Medicine

## 2020-10-12 ENCOUNTER — Other Ambulatory Visit: Payer: Self-pay

## 2020-10-12 DIAGNOSIS — I43 Cardiomyopathy in diseases classified elsewhere: Secondary | ICD-10-CM | POA: Insufficient documentation

## 2020-10-12 DIAGNOSIS — E782 Mixed hyperlipidemia: Secondary | ICD-10-CM | POA: Diagnosis present

## 2020-10-12 DIAGNOSIS — I480 Paroxysmal atrial fibrillation: Secondary | ICD-10-CM | POA: Insufficient documentation

## 2020-10-12 DIAGNOSIS — R000 Tachycardia, unspecified: Secondary | ICD-10-CM | POA: Diagnosis present

## 2020-10-12 LAB — ECHOCARDIOGRAM COMPLETE
Area-P 1/2: 3.3 cm2
S' Lateral: 4 cm

## 2020-10-25 ENCOUNTER — Other Ambulatory Visit: Payer: Self-pay | Admitting: Internal Medicine

## 2020-11-08 ENCOUNTER — Other Ambulatory Visit: Payer: BC Managed Care – PPO | Admitting: *Deleted

## 2020-11-08 ENCOUNTER — Other Ambulatory Visit: Payer: Self-pay

## 2020-11-08 DIAGNOSIS — I480 Paroxysmal atrial fibrillation: Secondary | ICD-10-CM

## 2020-11-08 LAB — CBC WITH DIFFERENTIAL/PLATELET
Basophils Absolute: 0 10*3/uL (ref 0.0–0.2)
Basos: 0 %
EOS (ABSOLUTE): 0.2 10*3/uL (ref 0.0–0.4)
Eos: 2 %
Hematocrit: 41.7 % (ref 34.0–46.6)
Hemoglobin: 13.8 g/dL (ref 11.1–15.9)
Immature Grans (Abs): 0 10*3/uL (ref 0.0–0.1)
Immature Granulocytes: 0 %
Lymphocytes Absolute: 1.2 10*3/uL (ref 0.7–3.1)
Lymphs: 17 %
MCH: 29.9 pg (ref 26.6–33.0)
MCHC: 33.1 g/dL (ref 31.5–35.7)
MCV: 91 fL (ref 79–97)
Monocytes Absolute: 0.6 10*3/uL (ref 0.1–0.9)
Monocytes: 9 %
Neutrophils Absolute: 5 10*3/uL (ref 1.4–7.0)
Neutrophils: 72 %
Platelets: 185 10*3/uL (ref 150–450)
RBC: 4.61 x10E6/uL (ref 3.77–5.28)
RDW: 12.8 % (ref 11.7–15.4)
WBC: 6.9 10*3/uL (ref 3.4–10.8)

## 2020-11-08 LAB — BASIC METABOLIC PANEL
BUN/Creatinine Ratio: 10 — ABNORMAL LOW (ref 12–28)
BUN: 9 mg/dL (ref 8–27)
CO2: 23 mmol/L (ref 20–29)
Calcium: 9.5 mg/dL (ref 8.7–10.3)
Chloride: 105 mmol/L (ref 96–106)
Creatinine, Ser: 0.86 mg/dL (ref 0.57–1.00)
Glucose: 87 mg/dL (ref 70–99)
Potassium: 4 mmol/L (ref 3.5–5.2)
Sodium: 144 mmol/L (ref 134–144)
eGFR: 76 mL/min/{1.73_m2} (ref >59)

## 2020-11-15 ENCOUNTER — Telehealth (HOSPITAL_COMMUNITY): Payer: Self-pay | Admitting: *Deleted

## 2020-11-15 ENCOUNTER — Encounter (HOSPITAL_COMMUNITY): Payer: Self-pay

## 2020-11-15 NOTE — Telephone Encounter (Signed)
Attempted to call patient regarding upcoming cardiac CT appointment. °Left message on voicemail with name and callback number ° °Karington Zarazua RN Navigator Cardiac Imaging °[facility] °[phone] Office °[phone] Cell ° °

## 2020-11-15 NOTE — Telephone Encounter (Signed)
Reaching out to patient to offer assistance regarding upcoming cardiac imaging study; pt verbalizes understanding of appt date/time, parking situation and where to check in, pre-test NPO status and medications ordered, and verified current allergies; name and call back number provided for further questions should they arise  Larey Brick RN Navigator Cardiac Imaging [facility] [phone] office [phone] cell  Patient to take 50mg  metoprolol tartrate two hours prior to cardiac CT if HR greater than 80bpm but will take 100mg  metoprolol tartrate if HR greater than 100bpm.

## 2020-11-17 ENCOUNTER — Other Ambulatory Visit: Payer: Self-pay

## 2020-11-17 ENCOUNTER — Ambulatory Visit (HOSPITAL_COMMUNITY)
Admission: RE | Admit: 2020-11-17 | Discharge: 2020-11-17 | Disposition: A | Discharge status: Home / Self Care | Payer: BC Managed Care – PPO | Source: Ambulatory Visit | Attending: Internal Medicine | Admitting: Internal Medicine

## 2020-11-17 DIAGNOSIS — I480 Paroxysmal atrial fibrillation: Secondary | ICD-10-CM | POA: Diagnosis present

## 2020-11-17 MED ORDER — IOHEXOL 350 MG/ML SOLN
80.0000 mL | Freq: Once | INTRAVENOUS | Status: AC | PRN
  Administered 2020-11-17: 80 mL via INTRAVENOUS

## 2020-11-17 MED ORDER — DILTIAZEM HCL 25 MG/5ML IV SOLN: 5.0000 mg | INTRAVENOUS | Status: DC | PRN

## 2020-11-17 MED ORDER — DILTIAZEM HCL 25 MG/5ML IV SOLN
INTRAVENOUS | Status: AC
  Administered 2020-11-17: 10 mg via INTRAVENOUS
  Filled 2020-11-17: qty 5

## 2020-11-17 MED ORDER — METOPROLOL TARTRATE 5 MG/5ML IV SOLN
5.0000 mg | INTRAVENOUS | Status: DC | PRN
  Administered 2020-11-17: 10 mg via INTRAVENOUS

## 2020-11-17 MED ORDER — METOPROLOL TARTRATE 5 MG/5ML IV SOLN
INTRAVENOUS | Status: AC
  Filled 2020-11-17: qty 10

## 2020-11-22 NOTE — Pre-Procedure Instructions (Signed)
Instructed patient on the following items: Arrival time 0830 Nothing to eat or drink after midnight No meds AM of procedure Responsible person to drive you home and stay with you for 24 hrs  Have you missed any doses of anti-coagulant Eliquis- hasn't missed any doses   

## 2020-11-23 ENCOUNTER — Ambulatory Visit (HOSPITAL_COMMUNITY): Payer: BC Managed Care – PPO | Admitting: Anesthesiology

## 2020-11-23 ENCOUNTER — Ambulatory Visit (HOSPITAL_COMMUNITY)
Admission: RE | Admit: 2020-11-23 | Discharge: 2020-11-23 | Disposition: A | Discharge status: Home / Self Care | Payer: BC Managed Care – PPO | Source: Ambulatory Visit | Attending: Internal Medicine | Admitting: Internal Medicine

## 2020-11-23 ENCOUNTER — Other Ambulatory Visit: Payer: Self-pay

## 2020-11-23 ENCOUNTER — Encounter (HOSPITAL_COMMUNITY): Payer: Self-pay | Admitting: Internal Medicine

## 2020-11-23 ENCOUNTER — Encounter (HOSPITAL_COMMUNITY)
Admission: RE | Disposition: A | Discharge status: Home / Self Care | Payer: Self-pay | Source: Ambulatory Visit | Attending: Internal Medicine

## 2020-11-23 DIAGNOSIS — E785 Hyperlipidemia, unspecified: Secondary | ICD-10-CM | POA: Diagnosis not present

## 2020-11-23 DIAGNOSIS — Z7901 Long term (current) use of anticoagulants: Secondary | ICD-10-CM | POA: Insufficient documentation

## 2020-11-23 DIAGNOSIS — E669 Obesity, unspecified: Secondary | ICD-10-CM | POA: Diagnosis not present

## 2020-11-23 DIAGNOSIS — I4819 Other persistent atrial fibrillation: Secondary | ICD-10-CM | POA: Insufficient documentation

## 2020-11-23 DIAGNOSIS — Z79899 Other long term (current) drug therapy: Secondary | ICD-10-CM | POA: Insufficient documentation

## 2020-11-23 DIAGNOSIS — I2510 Atherosclerotic heart disease of native coronary artery without angina pectoris: Secondary | ICD-10-CM | POA: Insufficient documentation

## 2020-11-23 DIAGNOSIS — Z6829 Body mass index (BMI) 29.0-29.9, adult: Secondary | ICD-10-CM | POA: Insufficient documentation

## 2020-11-23 DIAGNOSIS — I428 Other cardiomyopathies: Secondary | ICD-10-CM | POA: Insufficient documentation

## 2020-11-23 HISTORY — PX: ATRIAL FIBRILLATION ABLATION: EP1191

## 2020-11-23 LAB — POCT ACTIVATED CLOTTING TIME
Activated Clotting Time: 341 seconds
Activated Clotting Time: 323 seconds

## 2020-11-23 SURGERY — ATRIAL FIBRILLATION ABLATION
Anesthesia: General

## 2020-11-23 MED ORDER — PROPOFOL 10 MG/ML IV BOLUS
INTRAVENOUS | Status: DC | PRN
Start: 2020-11-23 — End: 2020-11-23
  Administered 2020-11-23: 120 mg via INTRAVENOUS

## 2020-11-23 MED ORDER — LIDOCAINE 2% (20 MG/ML) 5 ML SYRINGE
INTRAMUSCULAR | Status: DC | PRN
  Administered 2020-11-23: 60 mg via INTRAVENOUS

## 2020-11-23 MED ORDER — PHENYLEPHRINE 40 MCG/ML (10ML) SYRINGE FOR IV PUSH (FOR BLOOD PRESSURE SUPPORT)
PREFILLED_SYRINGE | INTRAVENOUS | Status: DC | PRN
  Administered 2020-11-23: 120 ug via INTRAVENOUS

## 2020-11-23 MED ORDER — FENTANYL CITRATE (PF) 100 MCG/2ML IJ SOLN
INTRAMUSCULAR | Status: DC | PRN
  Administered 2020-11-23: 50 ug via INTRAVENOUS

## 2020-11-23 MED ORDER — HEPARIN SODIUM (PORCINE) 1000 UNIT/ML IJ SOLN
INTRAMUSCULAR | Status: AC
  Filled 2020-11-23: qty 2

## 2020-11-23 MED ORDER — MIDAZOLAM HCL 5 MG/5ML IJ SOLN
INTRAMUSCULAR | Status: DC | PRN
  Administered 2020-11-23 (×2): 1 mg via INTRAVENOUS

## 2020-11-23 MED ORDER — HEPARIN SODIUM (PORCINE) 1000 UNIT/ML IJ SOLN
INTRAMUSCULAR | Status: DC | PRN
  Administered 2020-11-23: 1000 [IU] via INTRAVENOUS
  Administered 2020-11-23: 2000 [IU] via INTRAVENOUS

## 2020-11-23 MED ORDER — PROTAMINE SULFATE 10 MG/ML IV SOLN
INTRAVENOUS | Status: DC | PRN
  Administered 2020-11-23: 40 mg via INTRAVENOUS

## 2020-11-23 MED ORDER — SODIUM CHLORIDE 0.9% FLUSH: 3.0000 mL | INTRAVENOUS | Status: DC | PRN

## 2020-11-23 MED ORDER — ONDANSETRON HCL 4 MG/2ML IJ SOLN: 4.0000 mg | Freq: Four times a day (QID) | INTRAMUSCULAR | Status: DC | PRN

## 2020-11-23 MED ORDER — HYDROCODONE-ACETAMINOPHEN 5-325 MG PO TABS: 1.0000 | ORAL_TABLET | ORAL | Status: DC | PRN

## 2020-11-23 MED ORDER — SODIUM CHLORIDE 0.9 % IV SOLN: INTRAVENOUS | Status: DC

## 2020-11-23 MED ORDER — SUGAMMADEX SODIUM 200 MG/2ML IV SOLN
INTRAVENOUS | Status: DC | PRN
  Administered 2020-11-23: 200 mg via INTRAVENOUS

## 2020-11-23 MED ORDER — PHENYLEPHRINE HCL-NACL 20-0.9 MG/250ML-% IV SOLN
INTRAVENOUS | Status: DC | PRN
  Administered 2020-11-23: 10 ug/min via INTRAVENOUS

## 2020-11-23 MED ORDER — SODIUM CHLORIDE 0.9% FLUSH: 3.0000 mL | Freq: Two times a day (BID) | INTRAVENOUS | Status: DC

## 2020-11-23 MED ORDER — SODIUM CHLORIDE 0.9 % IV SOLN: 250.0000 mL | INTRAVENOUS | Status: DC | PRN

## 2020-11-23 MED ORDER — ONDANSETRON HCL 4 MG/2ML IJ SOLN
INTRAMUSCULAR | Status: DC | PRN
  Administered 2020-11-23: 4 mg via INTRAVENOUS

## 2020-11-23 MED ORDER — HEPARIN (PORCINE) IN NACL 1000-0.9 UT/500ML-% IV SOLN
INTRAVENOUS | Status: AC
  Filled 2020-11-23: qty 1500

## 2020-11-23 MED ORDER — ACETAMINOPHEN 325 MG PO TABS
650.0000 mg | ORAL_TABLET | ORAL | Status: DC | PRN
Start: 2020-11-23 — End: 2020-11-23

## 2020-11-23 MED ORDER — HEPARIN (PORCINE) IN NACL 1000-0.9 UT/500ML-% IV SOLN
INTRAVENOUS | Status: DC | PRN
  Administered 2020-11-23 (×3): 500 mL

## 2020-11-23 MED ORDER — PANTOPRAZOLE SODIUM 40 MG PO TBEC: 40.0000 mg | DELAYED_RELEASE_TABLET | Freq: Every day | ORAL | 0 refills | Status: DC

## 2020-11-23 MED ORDER — ROCURONIUM BROMIDE 10 MG/ML (PF) SYRINGE
PREFILLED_SYRINGE | INTRAVENOUS | Status: DC | PRN
  Administered 2020-11-23: 80 mg via INTRAVENOUS

## 2020-11-23 MED ORDER — DEXAMETHASONE SODIUM PHOSPHATE 10 MG/ML IJ SOLN
INTRAMUSCULAR | Status: DC | PRN
  Administered 2020-11-23: 10 mg via INTRAVENOUS

## 2020-11-23 MED ORDER — HEPARIN SODIUM (PORCINE) 1000 UNIT/ML IJ SOLN
INTRAMUSCULAR | Status: DC | PRN
  Administered 2020-11-23: 1000 [IU] via INTRAVENOUS
  Administered 2020-11-23: 15000 [IU] via INTRAVENOUS

## 2020-11-23 MED ORDER — APIXABAN 5 MG PO TABS
5.0000 mg | ORAL_TABLET | Freq: Once | ORAL | Status: AC
  Administered 2020-11-23: 5 mg via ORAL
  Filled 2020-11-23: qty 1

## 2020-11-23 SURGICAL SUPPLY — 18 items
CATH OCTARAY 2.0 F 3-3-3-3-3 (CATHETERS) ×1 IMPLANT
CATH SMTCH THERMOCOOL SF DF (CATHETERS) ×1 IMPLANT
CATH SOUNDSTAR ECO 8FR (CATHETERS) ×1 IMPLANT
CATH WEB BI DIR CSDF CRV REPRO (CATHETERS) ×1 IMPLANT
CLOSURE PERCLOSE PROSTYLE (VASCULAR PRODUCTS) ×3 IMPLANT
COVER SWIFTLINK CONNECTOR (BAG) ×2 IMPLANT
MAT PREVALON FULL STRYKER (MISCELLANEOUS) ×1 IMPLANT
NDL BAYLIS TRANSSEPTAL 71CM (NEEDLE) IMPLANT
NEEDLE BAYLIS TRANSSEPTAL 71CM (NEEDLE) ×2 IMPLANT
PACK EP LATEX FREE (CUSTOM PROCEDURE TRAY) ×2
PACK EP LF (CUSTOM PROCEDURE TRAY) ×1 IMPLANT
PAD PRO RADIOLUCENT 2001M-C (PAD) ×2 IMPLANT
PATCH CARTO3 (PAD) ×1 IMPLANT
SHEATH PINNACLE 7F 10CM (SHEATH) ×2 IMPLANT
SHEATH PINNACLE 9F 10CM (SHEATH) ×1 IMPLANT
SHEATH PROBE COVER 6X72 (BAG) ×1 IMPLANT
SHEATH SWARTZ TS SL2 63CM 8.5F (SHEATH) ×1 IMPLANT
TUBING SMART ABLATE COOLFLOW (TUBING) ×1 IMPLANT

## 2020-11-23 NOTE — Anesthesia Postprocedure Evaluation (Signed)
Anesthesia Post Note  Patient: Kiara [name]  Procedure(s) Performed: ATRIAL FIBRILLATION ABLATION     Patient location during evaluation: PACU Anesthesia Type: General Level of consciousness: awake and alert Pain management: pain level controlled Vital Signs Assessment: post-procedure vital signs reviewed and stable Respiratory status: spontaneous breathing, nonlabored ventilation, respiratory function stable and patient connected to nasal cannula oxygen Cardiovascular status: blood pressure returned to baseline and stable Postop Assessment: no apparent nausea or vomiting Anesthetic complications: no   No notable events documented.  Last Vitals:  Vitals:   11/23/20 1515 11/23/20 1525  BP:    Pulse: (!) 52 (!) 50  Resp: 19 18  Temp:    SpO2: 94% 93%    Last Pain:  Vitals:   11/23/20 1258  TempSrc:   PainSc: 0-No pain                 Luc Shammas L Maksim Peregoy

## 2020-11-23 NOTE — Transfer of Care (Signed)
Immediate Anesthesia Transfer of Care Note  Patient: Kiara [name]  Procedure(s) Performed: ATRIAL FIBRILLATION ABLATION  Patient Location: PACU and Cath Lab  Anesthesia Type:General  Level of Consciousness: drowsy, patient cooperative and responds to stimulation  Airway & Oxygen Therapy: Patient Spontanous Breathing  Post-op Assessment: Report given to RN and Post -op Vital signs reviewed and stable  Post vital signs: Reviewed and stable  Last Vitals:  Vitals Value Taken Time  BP 131/68 11/23/20 1204  Temp    Pulse 54 11/23/20 1204  Resp 17 11/23/20 1204  SpO2 97 % 11/23/20 1204  Vitals shown include unvalidated device data.  Last Pain:  Vitals:   11/23/20 0900  TempSrc:   PainSc: 0-No pain      Patients Stated Pain Goal: 5 (11/23/20 0900)  Complications: No notable events documented.

## 2020-11-23 NOTE — Anesthesia Procedure Notes (Signed)
Procedure Name: Intubation Date/Time: 11/23/2020 10:16 AM Performed by: Kyung Rudd, CRNA Pre-anesthesia Checklist: Patient identified, Emergency Drugs available, Suction available and Patient being monitored Patient Re-evaluated:Patient Re-evaluated prior to induction Oxygen Delivery Method: Circle system utilized Preoxygenation: Pre-oxygenation with 100% oxygen Induction Type: IV induction Ventilation: Mask ventilation without difficulty and Oral airway inserted - appropriate to patient size Laryngoscope Size: Mac and 3 Grade View: Grade I Tube type: Oral Tube size: 7.0 mm Number of attempts: 1 Airway Equipment and Method: Stylet Placement Confirmation: ETT inserted through vocal cords under direct vision, positive ETCO2 and breath sounds checked- equal and bilateral Secured at: 21 cm Tube secured with: Tape Dental Injury: Teeth and Oropharynx as per pre-operative assessment

## 2020-11-23 NOTE — H&P (Signed)
PCP: [name], MD   Primary EP: Dr Johney Frame   Kiara [name] is a 60 y.o. female who presents today for electrophysiology study and ablation of afib.  Since last being seen in our clinic, the patient reports doing reasonably well.  She has had increasing frequency and duration of her afib.  + palpitations and fatigue.  Today, she denies symptoms of chest pain, shortness of breath,  lower extremity edema, dizziness, presyncope, or syncope.  The patient is otherwise without complaint today.        Past Medical History:  Diagnosis Date   Anxiety     Asthma     Atrial fibrillation (HCC)      a. admx with AFib with RVR and acute systolic CHF 07/2013; TEE with LAA clot and no DCCV done;     Coronary artery disease      a.  LHC (07/17/13):  LM 20%, ostial CFX 20-30%   Depression     History of kidney stones     Hyperlipemia     Hypothyroidism      past hx   Nephrolithiasis 2013    "passed them"   NICM (nonischemic cardiomyopathy) (HCC)      a. Echo (07/17/13):  EF 25-30%, EF subsequently normalized with sinus rhythm (tachycardia mediated CM)   Obesity     Osteopenia           Past Surgical History:  Procedure Laterality Date   ABDOMINAL HYSTERECTOMY   ~ 2001   ABLATION   10/28/13    PVI by Dr Johney Frame   APPENDECTOMY   1978   ATRIAL FIBRILLATION ABLATION N/A 10/28/2013    Procedure: ATRIAL FIBRILLATION ABLATION;  Surgeon: Gardiner Rhyme, MD;  Location: [facility] CATH LAB;  Service: Cardiovascular;  Laterality: N/A;   BACK SURGERY Bilateral 2019    Fusion   CARDIAC CATHETERIZATION   07/2013   CARDIOVERSION N/A 08/18/2013    Procedure: CARDIOVERSION;  Surgeon: Donato Schultz, MD;  Location: [facility] ENDOSCOPY;  Service: Cardiovascular;  Laterality: N/A;   CARDIOVERSION N/A 08/29/2013    Procedure: CARDIOVERSION;  Surgeon: Pricilla Riffle, MD;  Location: [facility] OR;  Service: Cardiovascular;  Laterality: N/A;   ELECTROPHYSIOLOGIC STUDY N/A 11/05/2014    Procedure: Atrial Fibrillation Ablation;  Surgeon: Hillis Range, MD;  Location: [facility] INVASIVE CV LAB;  Service: Cardiovascular;  Laterality: N/A;   EYE SURGERY Bilateral 1999    Corrective laser surgery   implantable loop recorder removal   10/02/2018    MDT Reveal LINQ removed in office by Dr Johney Frame for EOL battery   IR SACROPLASTY BILATERAL   02/06/2019   LEFT HEART CATHETERIZATION WITH CORONARY ANGIOGRAM N/A 07/17/2013    Procedure: LEFT HEART CATHETERIZATION WITH CORONARY ANGIOGRAM;  Surgeon: Micheline Chapman, MD;  Location: [facility] CATH LAB;  Service: Cardiovascular;  Laterality: N/A;   LOOP RECORDER IMPLANT N/A 06/05/2014    Procedure: LOOP RECORDER IMPLANT;  Surgeon: Hillis Range, MD;  Location: [facility] CATH LAB;  Service: Cardiovascular;  Laterality: N/A;   TEE WITHOUT CARDIOVERSION N/A 07/18/2013    Procedure: TRANSESOPHAGEAL ECHOCARDIOGRAM (TEE);  Surgeon: Lewayne Bunting, MD;  Location: [facility] ENDOSCOPY;  Service: Cardiovascular;  Laterality: N/A;   TEE WITHOUT CARDIOVERSION N/A 08/18/2013    Procedure: TRANSESOPHAGEAL ECHOCARDIOGRAM (TEE);  Surgeon: Donato Schultz, MD;  Location: [facility] ENDOSCOPY;  Service: Cardiovascular;  Laterality: N/A;   TEE WITHOUT CARDIOVERSION N/A 10/27/2013    Procedure: TRANSESOPHAGEAL ECHOCARDIOGRAM (TEE);  Surgeon: Pricilla Riffle, MD;  Location: [facility] ENDOSCOPY;  Service: Cardiovascular;  Laterality: N/A;   TEE WITHOUT CARDIOVERSION N/A 11/04/2014    Procedure: TRANSESOPHAGEAL ECHOCARDIOGRAM (TEE);  Surgeon: Chrystie Nose, MD;  Location: [facility] ENDOSCOPY;  Service: Cardiovascular;  Laterality: N/A;      ROS- all systems are reviewed and negatives except as per HPI above         Current Outpatient Medications  Medication Sig Dispense Refill   acetaminophen (TYLENOL) 500 MG tablet Take 1,000-2,000 mg by mouth every 6 (six) hours as needed for mild pain or headache.       atorvastatin (LIPITOR) 40 MG tablet TAKE 1 TABLET BY MOUTH EVERY DAY AT 6PM 90 tablet 2   buPROPion (WELLBUTRIN XL) 150 MG 24 hr tablet Take 150 mg by mouth daily.        ELIQUIS 5 MG TABS tablet TAKE 1 TABLET BY MOUTH TWICE A DAY 180 tablet 1   flecainide (TAMBOCOR) 100 MG tablet TAKE 1 TABLET BY MOUTH TWICE A DAY 180 tablet 1   furosemide (LASIX) 20 MG tablet TAKE 1 TABLET BY MOUTH EVERY OTHER DAY 90 tablet 1   hydrOXYzine (ATARAX/VISTARIL) 25 MG tablet Take 25 mg by mouth at bedtime as needed for sleep.       lisinopril (ZESTRIL) 5 MG tablet TAKE 1 TABLET BY MOUTH EVERY DAY 90 tablet 3   metoprolol tartrate (LOPRESSOR) 25 MG tablet TAKE 0.5 TABLETS (12.5 MG TOTAL) BY MOUTH AS DIRECTED. (Patient taking differently: Take 25 mg by mouth daily as needed (afib).) 45 tablet 3   Multiple Vitamin (MULTIVITAMIN) tablet Take 1 tablet by mouth daily.       nystatin-triamcinolone (MYCOLOG II) cream Apply 1 application topically 2 (two) times daily as needed for rash.       potassium chloride (K-DUR) 10 MEQ tablet Take 1 tablet (10 mEq total) by mouth every other day. When taking Lasix 45 tablet 3   pramipexole (MIRAPEX) 0.25 MG tablet Take 0.25 mg by mouth at bedtime.       promethazine (PHENERGAN) 25 MG suppository Place 25 mg rectally every 6 (six) hours as needed for nausea/vomiting.       sertraline (ZOLOFT) 100 MG tablet Take 200 mg by mouth every morning.        VITAMIN D PO Take 1 tablet by mouth daily.       Semaglutide-Weight Management (WEGOVY) 1.7 MG/0.75ML SOAJ Inject 1.7 mg into the skin every Saturday. (Patient not taking: Reported on 09/22/2020)        No current facility-administered medications for this visit.             Facility-Administered Medications Ordered in Other Visits  Medication Dose Route Frequency Provider Last Rate Last Admin   0.9 %  sodium chloride infusion   Intravenous Continuous Ralene Muskrat, PA-C       0.9 %  sodium chloride infusion   Intravenous Continuous Ralene Muskrat, PA-C          Physical Exam: Vitals:   11/23/20 0843  BP: (!) 142/69  Pulse: (!) 50  Resp: 12  Temp: 97.6 F (36.4 C)  SpO2: 100%      GEN- The  patient is well appearing, alert and oriented x 3 today.   Head- normocephalic, atraumatic Eyes-  Sclera clear, conjunctiva pink Ears- hearing intact Oropharynx- clear Lungs- Clear to ausculation bilaterally, normal work of breathing Heart- tachycardic irregular rhythm GI- soft, NT, ND, + BS Extremities- no clubbing, cyanosis, or edema      Wt Readings from  Last 3 Encounters:  09/22/20 190 lb (86.2 kg)  09/13/20 191 lb 11.2 oz (87 kg)  01/01/20 206 lb 3.2 oz (93.5 kg)      EKG tracing ordered today is personally reviewed and shows afib with RVR   Assessment and Plan:   Paroxysmal atrial fibrillation Chads2vasc score is 3.  She is on eliquis The patient has symptomatic, recurrent  atrial fibrillation.  She is s/p ablation x 2 she has failed medical therapy with flecainide. Chads2vasc score is 3.  she is anticoagulated with eliquis .   Risk, benefits, and alternatives to EP study and radiofrequency ablation for afib were again discussed in detail today. These risks include but are not limited to stroke, bleeding, vascular damage, tamponade, perforation, damage to the esophagus, lungs, and other structures, pulmonary vein stenosis, worsening renal function, and death. The patient understands these risk and wishes to proceed.    Cardiac CT and echo reviewed at length with the patient today.   We discussed fatty liver and I have advised lifestyle change.  She is instructed to follow-up with Dr Renne Crigler also.  she reports compliance with OAC without interruption.  Hillis Range MD, [facility] 11/23/2020 9:18 AM

## 2020-11-23 NOTE — Anesthesia Preprocedure Evaluation (Addendum)
Anesthesia Evaluation  Patient identified by MRN, date of birth, ID band Patient awake    Reviewed: Allergy & Precautions, NPO status , Patient's Chart, lab work & pertinent test results, reviewed documented beta blocker date and time   Airway Mallampati: II  TM Distance: >3 FB Neck ROM: Full    Dental no notable dental hx. (+) Teeth Intact, Dental Advisory Given   Pulmonary asthma ,    Pulmonary exam normal breath sounds clear to auscultation       Cardiovascular + CAD  + dysrhythmias (on eliquis) Atrial Fibrillation  Rhythm:Irregular Rate:Normal  TTE 2022 1. Left ventricular ejection fraction, by estimation, is 60 to 65%. Left  ventricular ejection fraction by 3D volume is 62 %. The left ventricle has  normal function. The left ventricle has no regional wall motion  abnormalities. Left ventricular diastolic  parameters were normal.  2. Right ventricular systolic function is normal. The right ventricular  size is normal. There is normal pulmonary artery systolic pressure.  3. Left atrial size was mildly dilated.  4. The mitral valve is normal in structure. Mild mitral valve  regurgitation.  5. The aortic valve is normal in structure. Aortic valve regurgitation is  not visualized. No aortic stenosis is present.  Stress Test 2017 Blood pressure demonstrated a normal response to exercise. There was no ST segment deviation noted during stress. No exercise induced arrhythmias at a submaximal heart rate of 74% MPHR. Inconclusive study for ischemia as patient did not achieve target 85% MPHR    Neuro/Psych PSYCHIATRIC DISORDERS Anxiety Depression negative neurological ROS     GI/Hepatic negative GI ROS, Neg liver ROS,   Endo/Other  Hypothyroidism   Renal/GU negative Renal ROS  negative genitourinary   Musculoskeletal negative musculoskeletal ROS (+)   Abdominal   Peds  Hematology negative hematology ROS (+)    Anesthesia Other Findings   Reproductive/Obstetrics                            Anesthesia Physical Anesthesia Plan  ASA: 3  Anesthesia Plan: General   Post-op Pain Management:    Induction: Intravenous  PONV Risk Score and Plan: 3 and Midazolam, Dexamethasone and Ondansetron  Airway Management Planned: Oral ETT  Additional Equipment:   Intra-op Plan:   Post-operative Plan: Extubation in OR  Informed Consent: I have reviewed the patients History and Physical, chart, labs and discussed the procedure including the risks, benefits and alternatives for the proposed anesthesia with the patient or authorized representative who has indicated his/her understanding and acceptance.     Dental advisory given  Plan Discussed with: CRNA  Anesthesia Plan Comments:         Anesthesia Quick Evaluation

## 2020-11-23 NOTE — Discharge Instructions (Addendum)
Post procedure care instructions No driving for 4 days. No lifting over 5 lbs for 1 week. No vigorous or sexual activity for 1 week. You may return to work/your usual activities on 12/01/20. Keep procedure site clean & dry. If you notice increased pain, swelling, bleeding or pus, call/return!  You may shower after 24 hours, but no soaking in baths/hot tubs/pools for 1 week.    You have an appointment set up with the Atrial Fibrillation Clinic.  Multiple studies have shown that being followed by a dedicated atrial fibrillation clinic in addition to the standard care you receive from your other physicians improves health. We believe that enrollment in the atrial fibrillation clinic will allow [place] to better care for you.   The phone number to the Atrial Fibrillation Clinic is [phone]. The clinic is staffed Monday through Friday from 8:30am to 5pm.  Parking Directions: The clinic is located in the Heart and Vascular Building connected to [facility]. 1)From [address] turn on to [address] and go to the 3rd entrance  (Heart and Vascular entrance) on the right. 2)Look to the right for Heart &Vascular Parking Garage. 3)A code for the entrance is required, for November is 4444.   4)Take the elevators to the 1st floor. Registration is in the room with the glass walls at the end of the hallway.  If you have any trouble parking or locating the clinic, please don't hesitate to call [phone].    Cardiac Ablation, Care After  This sheet gives you information about how to care for yourself after your procedure. Your health care provider may also give you more specific instructions. If you have problems or questions, contact your health care provider. What can I expect after the procedure? After the procedure, it is common to have: Bruising around your puncture site. Tenderness around your puncture site. Skipped heartbeats. Tiredness (fatigue).  Follow these instructions at  home: Puncture site care  Follow instructions from your health care provider about how to take care of your puncture site. Make sure you: If present, leave stitches (sutures), skin glue, or adhesive strips in place. These skin closures may need to stay in place for up to 2 weeks. If adhesive strip edges start to loosen and curl up, you may trim the loose edges. Do not remove adhesive strips completely unless your health care provider tells you to do that. If a large square bandage is present, this may be removed 24 hours after surgery.  Check your puncture site every day for signs of infection. Check for: Redness, swelling, or pain. Fluid or blood. If your puncture site starts to bleed, lie down on your back, apply firm pressure to the area, and contact your health care provider. Warmth. Pus or a bad smell. Driving Do not drive for at least 4 days after your procedure or however long your health care provider recommends. (Do not resume driving if you have previously been instructed not to drive for other health reasons.) Do not drive or use heavy machinery while taking prescription pain medicine. Activity Avoid activities that take a lot of effort for at least 7 days after your procedure. Do not lift anything that is heavier than 5 lb (4.5 kg) for one week.  No sexual activity for 1 week.  Return to your normal activities as told by your health care provider. Ask your health care provider what activities are safe for you. General instructions Take over-the-counter and prescription medicines only as told by your health  care provider. Do not use any products that contain nicotine or tobacco, such as cigarettes and e-cigarettes. If you need help quitting, ask your health care provider. You may shower after 24 hours, but Do not take baths, swim, or use a hot tub for 1 week.  Do not drink alcohol for 24 hours after your procedure. Keep all follow-up visits as told by your health care provider. This  is important. Contact a health care provider if: You have redness, mild swelling, or pain around your puncture site. You have fluid or blood coming from your puncture site that stops after applying firm pressure to the area. Your puncture site feels warm to the touch. You have pus or a bad smell coming from your puncture site. You have a fever. You have chest pain or discomfort that spreads to your neck, jaw, or arm. You are sweating a lot. You feel nauseous. You have a fast or irregular heartbeat. You have shortness of breath. You are dizzy or light-headed and feel the need to lie down. You have pain or numbness in the arm or leg closest to your puncture site. Get help right away if: Your puncture site suddenly swells. Your puncture site is bleeding and the bleeding does not stop after applying firm pressure to the area. These symptoms may represent a serious problem that is an emergency. Do not wait to see if the symptoms will go away. Get medical help right away. Call your local emergency services (911 in the [place] Do not drive yourself to the hospital. Summary After the procedure, it is normal to have bruising and tenderness at the puncture site in your groin, neck, or forearm. Check your puncture site every day for signs of infection. Get help right away if your puncture site is bleeding and the bleeding does not stop after applying firm pressure to the area. This is a medical emergency. This information is not intended to replace advice given to you by your health care provider. Make sure you discuss any questions you have with your health care provider.

## 2020-12-03 ENCOUNTER — Other Ambulatory Visit: Payer: Self-pay | Admitting: Internal Medicine

## 2020-12-17 ENCOUNTER — Other Ambulatory Visit: Payer: Self-pay | Admitting: Internal Medicine

## 2020-12-21 ENCOUNTER — Ambulatory Visit (HOSPITAL_COMMUNITY): Payer: BC Managed Care – PPO | Admitting: Nurse Practitioner

## 2020-12-27 DIAGNOSIS — M21611 Bunion of right foot: Secondary | ICD-10-CM

## 2020-12-27 HISTORY — DX: Bunion of right foot: M21.611

## 2020-12-30 ENCOUNTER — Other Ambulatory Visit: Payer: Self-pay | Admitting: *Deleted

## 2020-12-30 ENCOUNTER — Other Ambulatory Visit: Payer: Self-pay | Admitting: Internal Medicine

## 2020-12-30 NOTE — Telephone Encounter (Signed)
Pt's pharmacy is requesting a refill on pantoprazole. Would Dr. Allred like to refill this medication? Please address 

## 2021-01-19 ENCOUNTER — Encounter (HOSPITAL_COMMUNITY): Payer: Self-pay | Admitting: Nurse Practitioner

## 2021-01-19 ENCOUNTER — Other Ambulatory Visit: Payer: Self-pay

## 2021-01-19 ENCOUNTER — Ambulatory Visit (HOSPITAL_COMMUNITY)
Admission: RE | Admit: 2021-01-19 | Discharge: 2021-01-19 | Disposition: A | Discharge status: Home / Self Care | Payer: BC Managed Care – PPO | Source: Ambulatory Visit | Attending: Nurse Practitioner | Admitting: Nurse Practitioner

## 2021-01-19 VITALS — BP 132/66 | HR 53 | Ht 66.0 in | Wt 193.6 lb

## 2021-01-19 DIAGNOSIS — I4891 Unspecified atrial fibrillation: Secondary | ICD-10-CM | POA: Diagnosis not present

## 2021-01-19 DIAGNOSIS — Z79899 Other long term (current) drug therapy: Secondary | ICD-10-CM | POA: Insufficient documentation

## 2021-01-19 DIAGNOSIS — I10 Essential (primary) hypertension: Secondary | ICD-10-CM | POA: Insufficient documentation

## 2021-01-19 DIAGNOSIS — Z7901 Long term (current) use of anticoagulants: Secondary | ICD-10-CM | POA: Diagnosis not present

## 2021-01-19 DIAGNOSIS — I480 Paroxysmal atrial fibrillation: Secondary | ICD-10-CM | POA: Diagnosis not present

## 2021-01-19 DIAGNOSIS — D6869 Other thrombophilia: Secondary | ICD-10-CM

## 2021-01-19 NOTE — Progress Notes (Signed)
Primary Care Physician: Deland Pretty, MD Referring Physician: Dr. Rayann Heman   Kiara [name] is a 60 y.o. female with a h/o afib s/p remote ablation x 2. She had another ablation  11/23/20 and is in the afib clinic for f/u. She lost her mother shortly after ablation and had some afib at that time, otherwise has been staying in [place]. No swallowing or groin issues. EKG shows SR today.    Today, she denies symptoms of palpitations, chest pain, shortness of breath, orthopnea, PND, lower extremity edema, dizziness, presyncope, syncope, or neurologic sequela. The patient is tolerating medications without difficulties and is otherwise without complaint today.   Past Medical History:  Diagnosis Date   Anxiety    Asthma    Atrial fibrillation ([place])    a. admx with AFib with RVR and acute systolic CHF Q000111Q; TEE with LAA clot and no DCCV done;     Coronary artery disease    a.  LHC (07/17/13):  LM 20%, ostial CFX 20-30%   Depression    History of kidney stones    Hyperlipemia    Hypothyroidism    past hx   Nephrolithiasis 2013   "passed them"   NICM (nonischemic cardiomyopathy) ([place])    a. Echo (07/17/13):  EF 25-30%, EF subsequently normalized with sinus rhythm (tachycardia mediated CM)   Obesity    Osteopenia    Past Surgical History:  Procedure Laterality Date   ABDOMINAL HYSTERECTOMY  ~ 2001   ABLATION  10/28/13   PVI by Dr Rayann Heman   APPENDECTOMY  1978   ATRIAL FIBRILLATION ABLATION N/A 10/28/2013   Procedure: ATRIAL FIBRILLATION ABLATION;  Surgeon: Coralyn Mark, MD;  Location: [place] CATH LAB;  Service: Cardiovascular;  Laterality: N/A;   ATRIAL FIBRILLATION ABLATION N/A 11/23/2020   Procedure: ATRIAL FIBRILLATION ABLATION;  Surgeon: Thompson Grayer, MD;  Location: [place] CV LAB;  Service: Cardiovascular;  Laterality: N/A;   BACK SURGERY Bilateral 2019   Fusion   CARDIAC CATHETERIZATION  07/2013   CARDIOVERSION N/A 08/18/2013   Procedure: CARDIOVERSION;  Surgeon: Candee Furbish, MD;   Location: [place];  Service: Cardiovascular;  Laterality: N/A;   CARDIOVERSION N/A 08/29/2013   Procedure: CARDIOVERSION;  Surgeon: Fay Records, MD;  Location: [place];  Service: Cardiovascular;  Laterality: N/A;   ELECTROPHYSIOLOGIC STUDY N/A 11/05/2014   Procedure: Atrial Fibrillation Ablation;  Surgeon: Thompson Grayer, MD;  Location: [place] CV LAB;  Service: Cardiovascular;  Laterality: N/A;   EYE SURGERY Bilateral 1999   Corrective laser surgery   implantable loop recorder removal  10/02/2018   MDT Reveal LINQ removed in office by Dr Rayann Heman for EOL battery   IR SACROPLASTY BILATERAL  02/06/2019   LEFT HEART CATHETERIZATION WITH CORONARY ANGIOGRAM N/A 07/17/2013   Procedure: LEFT HEART CATHETERIZATION WITH CORONARY ANGIOGRAM;  Surgeon: Blane Ohara, MD;  Location: [facility] CATH LAB;  Service: Cardiovascular;  Laterality: N/A;   LOOP RECORDER IMPLANT N/A 06/05/2014   Procedure: LOOP RECORDER IMPLANT;  Surgeon: Thompson Grayer, MD;  Location: [facility] CATH LAB;  Service: Cardiovascular;  Laterality: N/A;   TEE WITHOUT CARDIOVERSION N/A 07/18/2013   Procedure: TRANSESOPHAGEAL ECHOCARDIOGRAM (TEE);  Surgeon: Lelon Perla, MD;  Location: [facility] ENDOSCOPY;  Service: Cardiovascular;  Laterality: N/A;   TEE WITHOUT CARDIOVERSION N/A 08/18/2013   Procedure: TRANSESOPHAGEAL ECHOCARDIOGRAM (TEE);  Surgeon: Candee Furbish, MD;  Location: [facility] ENDOSCOPY;  Service: Cardiovascular;  Laterality: N/A;   TEE WITHOUT CARDIOVERSION N/A 10/27/2013   Procedure: TRANSESOPHAGEAL ECHOCARDIOGRAM (TEE);  Surgeon:  Fay Records, MD;  Location: [place];  Service: Cardiovascular;  Laterality: N/A;   TEE WITHOUT CARDIOVERSION N/A 11/04/2014   Procedure: TRANSESOPHAGEAL ECHOCARDIOGRAM (TEE);  Surgeon: Pixie Casino, MD;  Location: [facility] ENDOSCOPY;  Service: Cardiovascular;  Laterality: N/A;    Current Outpatient Medications  Medication Sig Dispense Refill   acetaminophen (TYLENOL) 500 MG tablet Take 1,000-2,000 mg by mouth every 6  (six) hours as needed for mild pain or headache.     atorvastatin (LIPITOR) 40 MG tablet TAKE 1 TABLET BY MOUTH EVERY DAY AT 6PM 90 tablet 2   buPROPion (WELLBUTRIN XL) 150 MG 24 hr tablet Take 150 mg by mouth daily.     ELIQUIS 5 MG TABS tablet TAKE 1 TABLET BY MOUTH TWICE A DAY 180 tablet 1   flecainide (TAMBOCOR) 100 MG tablet TAKE 1 TABLET BY MOUTH TWICE A DAY 180 tablet 1   furosemide (LASIX) 20 MG tablet TAKE 1 TABLET BY MOUTH EVERY OTHER DAY (Patient taking differently: Take 20 mg by mouth 3 (three) times a week.) 90 tablet 1   hydrOXYzine (ATARAX/VISTARIL) 25 MG tablet Take 25 mg by mouth at bedtime.     lisinopril (ZESTRIL) 5 MG tablet TAKE 1 TABLET BY MOUTH EVERY DAY 90 tablet 3   metoprolol tartrate (LOPRESSOR) 25 MG tablet TAKE 0.5 TABLETS (12.5 MG TOTAL) BY MOUTH AS DIRECTED. (Patient taking differently: Take 25 mg by mouth daily as needed (Afib).) 45 tablet 3   Multiple Vitamin (MULTIVITAMIN) tablet Take 1 tablet by mouth daily. Woman's 50 +     nystatin-triamcinolone (MYCOLOG II) cream Apply 1 application topically 2 (two) times daily as needed for rash.     potassium chloride (K-DUR) 10 MEQ tablet Take 1 tablet (10 mEq total) by mouth every other day. When taking Lasix (Patient taking differently: Take 10 mEq by mouth 3 (three) times a week. When taking Lasix) 45 tablet 3   pramipexole (MIRAPEX) 0.25 MG tablet Take 0.25-0.5 mg by mouth at bedtime.     sertraline (ZOLOFT) 100 MG tablet Take 200 mg by mouth every morning.      Vitamin D3 (VITAMIN D) 25 MCG tablet Take 1,000 Units by mouth daily.     No current facility-administered medications for this encounter.   Facility-Administered Medications Ordered in Other Encounters  Medication Dose Route Frequency Provider Last Rate Last Admin   0.9 %  sodium chloride infusion   Intravenous Continuous Monia Sabal, PA-C       0.9 %  sodium chloride infusion   Intravenous Continuous Monia Sabal, PA-C        Allergies  Allergen  Reactions   Azithromycin Diarrhea and Nausea And Vomiting   Other Other (See Comments)   Tape Rash    Surgical     Social History   Socioeconomic History   Marital status: Married    Spouse name: Not on file   Number of children: Not on file   Years of education: Not on file   Highest education level: Not on file  Occupational History   Not on file  Tobacco Use   Smoking status: Never   Smokeless tobacco: Never  Vaping Use   Vaping Use: Never used  Substance and Sexual Activity   Alcohol use: Yes    Comment: occasional   Drug use: No   Sexual activity: Yes  Other Topics Concern   Not on file  Social History Narrative   Not on file   Social Determinants of Health  Financial Resource Strain: Not on file  Food Insecurity: Not on file  Transportation Needs: Not on file  Physical Activity: Not on file  Stress: Not on file  Social Connections: Not on file  Intimate Partner Violence: Not on file    Family History  Problem Relation Age of Onset   Hypertension Mother    Lymphoma Mother    Hypertension Father     ROS- All systems are reviewed and negative except as per the HPI above  Physical Exam: Vitals:   01/19/21 1311  Weight: 87.8 kg  Height: 5\' 6"  (1.676 m)   Wt Readings from Last 3 Encounters:  01/19/21 87.8 kg  11/23/20 83.5 kg  09/22/20 86.2 kg    Labs: Lab Results  Component Value Date   NA 144 11/08/2020   K 4.0 11/08/2020   CL 105 11/08/2020   CO2 23 11/08/2020   GLUCOSE 87 11/08/2020   BUN 9 11/08/2020   CREATININE 0.86 11/08/2020   CALCIUM 9.5 11/08/2020   MG 2.1 09/13/2020   Lab Results  Component Value Date   INR 0.9 02/06/2019   Lab Results  Component Value Date   CHOL 198 07/17/2013   HDL 59 07/17/2013   LDLCALC 125 (H) 07/17/2013   TRIG 69 07/17/2013     GEN- The patient is well appearing, alert and oriented x 3 today.   Head- normocephalic, atraumatic Eyes-  Sclera clear, conjunctiva pink Ears- hearing  intact Oropharynx- clear Neck- supple, no JVP Lymph- no cervical lymphadenopathy Lungs- Clear to ausculation bilaterally, normal work of breathing Heart- Regular rate and rhythm, no murmurs, rubs or gallops, PMI not laterally displaced GI- soft, NT, ND, + BS Extremities- no clubbing, cyanosis, or edema MS- no significant deformity or atrophy Skin- no rash or lesion Psych- euthymic mood, full affect Neuro- strength and sensation are intact  EKG-sinus brady at 53 bpm, pr int 186 ms, qrs int 106 ms, qtc 437 ms   Epic records reviewed   Assessment and Plan:  1.Afib S/p ablation one month ago Is staying in [place]  Continue metoprolol tartrate 12.5 mg / flecainide 100 mg daily   2. HTN Stable   3. CHA2DS2VASc score of 3 Continue eliquis 5 mg bid   F/u with Dr. 09/16/2013 02/22/20   04/21/20. [facility] [address] [place], [place] [phone]

## 2021-01-27 ENCOUNTER — Other Ambulatory Visit: Payer: Self-pay | Admitting: Internal Medicine

## 2021-01-31 DIAGNOSIS — M7751 Other enthesopathy of right foot: Secondary | ICD-10-CM | POA: Insufficient documentation

## 2021-01-31 HISTORY — DX: Other enthesopathy of right foot and ankle: M77.51

## 2021-02-21 ENCOUNTER — Telehealth: Payer: Self-pay | Admitting: Internal Medicine

## 2021-02-21 ENCOUNTER — Ambulatory Visit: Payer: BC Managed Care – PPO | Admitting: Internal Medicine

## 2021-02-21 NOTE — Telephone Encounter (Signed)
Patient's spouse stating she has been recently diagnosed with COVID.  They are going to give her Decadron, he wants to make sure it's capable with her heart medication.

## 2021-02-22 NOTE — Telephone Encounter (Signed)
Returned call to Pt's husband.  Advised decadron ok to take per pharmacy

## 2021-03-02 ENCOUNTER — Ambulatory Visit (HOSPITAL_BASED_OUTPATIENT_CLINIC_OR_DEPARTMENT_OTHER): Payer: BC Managed Care – PPO | Admitting: Internal Medicine

## 2021-04-07 ENCOUNTER — Encounter (HOSPITAL_BASED_OUTPATIENT_CLINIC_OR_DEPARTMENT_OTHER): Payer: Self-pay | Admitting: Internal Medicine

## 2021-04-07 ENCOUNTER — Ambulatory Visit (HOSPITAL_BASED_OUTPATIENT_CLINIC_OR_DEPARTMENT_OTHER): Payer: BC Managed Care – PPO | Admitting: Internal Medicine

## 2021-04-07 ENCOUNTER — Other Ambulatory Visit: Payer: Self-pay

## 2021-04-07 VITALS — BP 122/78 | HR 59 | Ht 66.0 in | Wt 198.0 lb

## 2021-04-07 DIAGNOSIS — I480 Paroxysmal atrial fibrillation: Secondary | ICD-10-CM | POA: Diagnosis not present

## 2021-04-07 DIAGNOSIS — R000 Tachycardia, unspecified: Secondary | ICD-10-CM

## 2021-04-07 DIAGNOSIS — I43 Cardiomyopathy in diseases classified elsewhere: Secondary | ICD-10-CM | POA: Diagnosis not present

## 2021-04-07 NOTE — Patient Instructions (Addendum)
Medication Instructions:  Your physician recommends that you continue on your current medications as directed. Please refer to the Current Medication list given to you today.  Labwork: None ordered.  Testing/Procedures: None ordered.  Follow-Up: Your physician wants you to follow-up in: 3 months with AFIB clinic.  Any Other Special Instructions Will Be Listed Below (If Applicable).  If you need a refill on your cardiac medications before your next appointment, please call your pharmacy.

## 2021-04-07 NOTE — Progress Notes (Signed)
PCP: Deland Pretty, MD  Kiara [name] is a 61 y.o. female who presents today for routine electrophysiology followup.  Since his recent afib ablation, the patient reports doing very well.  she denies procedure related complications and is pleased with the results of the procedure.  Today, she denies symptoms of palpitations, chest pain, shortness of breath,  lower extremity edema, dizziness, presyncope, or syncope.  The patient is otherwise without complaint today.   Past Medical History:  Diagnosis Date   Anxiety    Asthma    Atrial fibrillation ([place])    a. admx with AFib with RVR and acute systolic CHF Q000111Q; TEE with LAA clot and no DCCV done;     Coronary artery disease    a.  LHC (07/17/13):  LM 20%, ostial CFX 20-30%   Depression    History of kidney stones    Hyperlipemia    Hypothyroidism    past hx   Nephrolithiasis 2013   "passed them"   NICM (nonischemic cardiomyopathy) ([place])    a. Echo (07/17/13):  EF 25-30%, EF subsequently normalized with sinus rhythm (tachycardia mediated CM)   Obesity    Osteopenia    Past Surgical History:  Procedure Laterality Date   ABDOMINAL HYSTERECTOMY  ~ 2001   ABLATION  10/28/13   PVI by Dr Rayann Heman   APPENDECTOMY  1978   ATRIAL FIBRILLATION ABLATION N/A 10/28/2013   Procedure: ATRIAL FIBRILLATION ABLATION;  Surgeon: Coralyn Mark, MD;  Location: [place] CATH LAB;  Service: Cardiovascular;  Laterality: N/A;   ATRIAL FIBRILLATION ABLATION N/A 11/23/2020   Procedure: ATRIAL FIBRILLATION ABLATION;  Surgeon: Thompson Grayer, MD;  Location: [place] CV LAB;  Service: Cardiovascular;  Laterality: N/A;   BACK SURGERY Bilateral 2019   Fusion   CARDIAC CATHETERIZATION  07/2013   CARDIOVERSION N/A 08/18/2013   Procedure: CARDIOVERSION;  Surgeon: Candee Furbish, MD;  Location: [place];  Service: Cardiovascular;  Laterality: N/A;   CARDIOVERSION N/A 08/29/2013   Procedure: CARDIOVERSION;  Surgeon: Fay Records, MD;  Location: [place];  Service:  Cardiovascular;  Laterality: N/A;   ELECTROPHYSIOLOGIC STUDY N/A 11/05/2014   Procedure: Atrial Fibrillation Ablation;  Surgeon: Thompson Grayer, MD;  Location: [place] CV LAB;  Service: Cardiovascular;  Laterality: N/A;   EYE SURGERY Bilateral 1999   Corrective laser surgery   implantable loop recorder removal  10/02/2018   MDT Reveal LINQ removed in office by Dr Rayann Heman for EOL battery   IR SACROPLASTY BILATERAL  02/06/2019   LEFT HEART CATHETERIZATION WITH CORONARY ANGIOGRAM N/A 07/17/2013   Procedure: LEFT HEART CATHETERIZATION WITH CORONARY ANGIOGRAM;  Surgeon: Blane Ohara, MD;  Location: [facility] CATH LAB;  Service: Cardiovascular;  Laterality: N/A;   LOOP RECORDER IMPLANT N/A 06/05/2014   Procedure: LOOP RECORDER IMPLANT;  Surgeon: Thompson Grayer, MD;  Location: [facility] CATH LAB;  Service: Cardiovascular;  Laterality: N/A;   TEE WITHOUT CARDIOVERSION N/A 07/18/2013   Procedure: TRANSESOPHAGEAL ECHOCARDIOGRAM (TEE);  Surgeon: Lelon Perla, MD;  Location: [facility] ENDOSCOPY;  Service: Cardiovascular;  Laterality: N/A;   TEE WITHOUT CARDIOVERSION N/A 08/18/2013   Procedure: TRANSESOPHAGEAL ECHOCARDIOGRAM (TEE);  Surgeon: Candee Furbish, MD;  Location: [facility] ENDOSCOPY;  Service: Cardiovascular;  Laterality: N/A;   TEE WITHOUT CARDIOVERSION N/A 10/27/2013   Procedure: TRANSESOPHAGEAL ECHOCARDIOGRAM (TEE);  Surgeon: Fay Records, MD;  Location: [place];  Service: Cardiovascular;  Laterality: N/A;   TEE WITHOUT CARDIOVERSION N/A 11/04/2014   Procedure: TRANSESOPHAGEAL ECHOCARDIOGRAM (TEE);  Surgeon: Pixie Casino, MD;  Location: [facility] ENDOSCOPY;  Service: Cardiovascular;  Laterality: N/A;    ROS- all systems are personally reviewed and negatives except as per HPI above  Current Outpatient Medications  Medication Sig Dispense Refill   acetaminophen (TYLENOL) 500 MG tablet Take 1,000-2,000 mg by mouth every 6 (six) hours as needed for mild pain or headache.     atorvastatin (LIPITOR) 40 MG tablet TAKE 1 TABLET BY  MOUTH EVERY DAY AT 6PM 90 tablet 2   buPROPion (WELLBUTRIN XL) 150 MG 24 hr tablet Take 150 mg by mouth daily.     ELIQUIS 5 MG TABS tablet TAKE 1 TABLET BY MOUTH TWICE A DAY 180 tablet 1   flecainide (TAMBOCOR) 100 MG tablet TAKE 1 TABLET BY MOUTH TWICE A DAY 180 tablet 1   furosemide (LASIX) 20 MG tablet TAKE 1 TABLET BY MOUTH EVERY OTHER DAY (Patient taking differently: Take 20 mg by mouth 3 (three) times a week.) 90 tablet 1   hydrOXYzine (ATARAX/VISTARIL) 25 MG tablet Take 25 mg by mouth at bedtime.     KLOR-CON M10 10 MEQ tablet TAKE 1 TABLET (10 MEQ TOTAL) BY MOUTH EVERY OTHER DAY. WHEN TAKING LASIX 45 tablet 2   lisinopril (ZESTRIL) 5 MG tablet TAKE 1 TABLET BY MOUTH EVERY DAY 90 tablet 3   metoprolol tartrate (LOPRESSOR) 25 MG tablet TAKE 0.5 TABLETS (12.5 MG TOTAL) BY MOUTH AS DIRECTED. (Patient taking differently: Take 25 mg by mouth daily as needed (Afib).) 45 tablet 3   Multiple Vitamin (MULTIVITAMIN) tablet Take 1 tablet by mouth daily. Woman's 50 +     nystatin-triamcinolone (MYCOLOG II) cream Apply 1 application topically 2 (two) times daily as needed for rash.     pramipexole (MIRAPEX) 0.25 MG tablet Take 0.25-0.5 mg by mouth at bedtime.     sertraline (ZOLOFT) 100 MG tablet Take 200 mg by mouth every morning.      Vitamin D3 (VITAMIN D) 25 MCG tablet Take 1,000 Units by mouth daily.     No current facility-administered medications for this visit.   Facility-Administered Medications Ordered in Other Visits  Medication Dose Route Frequency Provider Last Rate Last Admin   0.9 %  sodium chloride infusion   Intravenous Continuous Monia Sabal, PA-C       0.9 %  sodium chloride infusion   Intravenous Continuous Monia Sabal, PA-C        Physical Exam: Vitals:   04/07/21 1147  BP: 122/78  Pulse: (!) 59  SpO2: 98%  Weight: 198 lb (89.8 kg)  Height: 5\' 6"  (1.676 m)    GEN- The patient is well appearing, alert and oriented x 3 today.   Head- normocephalic,  atraumatic Eyes-  Sclera clear, conjunctiva pink Ears- hearing intact Oropharynx- clear Lungs- Clear to ausculation bilaterally, normal work of breathing Heart- Regular rate and rhythm, no murmurs, rubs or gallops, PMI not laterally displaced GI- soft, NT, ND, + BS Extremities- no clubbing, cyanosis, or edema  EKG tracing ordered today is personally reviewed and shows sinus  Assessment and Plan:  1. Persistent atrial fibrillation Doing well s/p ablation x 2 chads2vasc score is 3.   2. Tachycardia mediated CM Resolved with sinus  Return to see me in 3 months  Thompson Grayer MD, [facility] 04/07/2021 11:53 AM

## 2021-04-30 ENCOUNTER — Other Ambulatory Visit: Payer: Self-pay | Admitting: Internal Medicine

## 2021-05-08 ENCOUNTER — Other Ambulatory Visit: Payer: Self-pay | Admitting: Internal Medicine

## 2021-05-09 NOTE — Telephone Encounter (Signed)
Pt last saw Dr Johney Frame 04/07/21, last labs 9/26//22 Creat 0.86, age 61, weight 89.8kg, based on specified criteria pt is on appropriate dosage of Eliquis 5mg  BID for afib.  Will refill rx.  ?

## 2021-06-15 ENCOUNTER — Other Ambulatory Visit: Payer: Self-pay | Admitting: Internal Medicine

## 2021-08-02 ENCOUNTER — Other Ambulatory Visit: Payer: Self-pay | Admitting: Internal Medicine

## 2021-11-27 NOTE — Progress Notes (Signed)
Assessment/Plan:   Kiara [name] is a very pleasant 61 y.o. year old RH female with significant cardiovascular history including  hypertension, hyperlipidemia, moderate MR, mild CAD, CSHF, Afib, history of sinus bradycardia, history of thyroid nodule R , as well as a history of asthma, depression, Vit D and B12 deficiency, history of spinal stenosis with several back surgeries seen today for evaluation of memory loss with sleep disturbance. MoCA today is 25/30.  Patient reports that in the past, she has been told that she has "the Alzheimer's gene ", although no formal documentation is available for review.  She has a strong family history of Alzheimer's disease.  LBD is in the differential, however she does not show any parkinsonian signs, and there are no hallucinations or paranoia to support it at this time.  MIld Cognitive Impairment with sleep disturbance  MRI brain without contrast to assess for underlying structural abnormality and assess vascular load  Neurocognitive testing to further evaluate cognitive concerns and determine other underlying cause of memory changes, including potential contribution from sleep, anxiety, or depression and for clarity of diagnosis  Obtain results from the studies suggesting "positive gene for Alzheimer's " Referral to sleep study for REM sleep behavior disorder Agree with psychiatry for the treatment of major depression Follow-up in 1 month  Subjective:   The patient is accompanied by her husband who supplements the history.   How long did patient have memory difficulties? For the last  9 months, she reports that when she is trying to say a sentence "there is a word that does not come out, cannot get the right word, and is usually at the end of the sentence ".  She compensates by saying to herself "if I have not used that word in a year, for example labs and more water, then it is okay not to remember.  ".  She also tries to do brain games.  She  denies forgetting any recent conversations or names.  Long-term memory is good.   repeats oneself?  Endorsed, but she also attributes some component of hearing loss, she has an appointment for hearing studies. Disoriented when walking into a room?  Patient denies   Leaving objects in unusual places?  Patient denies   Ambulates  with difficulty?   Patient denies   Recent falls?  Patient denies   Any head injuries?  Patient denies   History of seizures?   Patient denies   Wandering behavior?  Patient denies   Patient drives?  No issues with driving. Any mood changes?  The patient has a history of clinical depression, has not been treated in years," has maximized the use of the depression medications given by her PCP, so she has been referred to a psychiatrist, she has an appointment soon "-husband says.  He also reports that her mental clarity and memory frustration appeared after her mother's death.  "During 5 years I was over preoccupied with taking care of my mother and I do not even know how I did it ".  Patient states that she has not been back to church, has been more introverted and reclusive, and she only goes to a prayer group because her husband leads it ".  Hallucinations?  Paranoia?  Patient denies   Patient reports that  She has REM behavior or sleepwalking   Patient reports vivid dreams and screams, kicking and acting out.  Patient used to have vivid dreams before, but has become worse since her mother died.  Daughter is concerned that this could be a sign of Lewy body disease. History of sleep apnea?  Patient denies   Any hygiene concerns?  Patient denies   Independent of bathing and dressing?  Endorsed  Does the patient needs help with medications?  Patient is in charge   Who is in charge of the finances Husband is in charge   (he is a Customer service manager) Any changes in appetite? Endorsed, weight  gain "inexplicable sweet craving for the last 4 months " Patient have trouble swallowing? Patient  denies   Does the patient cook?  Patient denies   Any kitchen accidents such as leaving the stove on? Patient denies   Any headaches?  Patient denies   The double vision? Patient denies   Any focal numbness or tingling?  Patient denies   Chronic back pain Patient denies   Unilateral weakness?  Patient denies   Any tremors?  Patient denies   Any history of anosmia?  Patient denies   Any incontinence of urine?  Patient denies   Any bowel dysfunction?   Patient denies    History of heavy alcohol intake?  Only weekends, 1 can of beer at most History of heavy tobacco use?  Patient denies   Family history of dementia?  Her mother had "volatile AD " Patient lives with husband  Past Medical History:  Diagnosis Date   Anxiety    Asthma    Atrial fibrillation ([place])    a. admx with AFib with RVR and acute systolic CHF 0/0762; TEE with LAA clot and no DCCV done;     Coronary artery disease    a.  LHC (07/17/13):  LM 20%, ostial CFX 20-30%   Depression    History of kidney stones    Hyperlipemia    Hypothyroidism    past hx   Nephrolithiasis 2013   "passed them"   NICM (nonischemic cardiomyopathy) ([place])    a. Echo (07/17/13):  EF 25-30%, EF subsequently normalized with sinus rhythm (tachycardia mediated CM)   Obesity    Osteopenia      Past Surgical History:  Procedure Laterality Date   ABDOMINAL HYSTERECTOMY  ~ 2001   ABLATION  10/28/13   PVI by Dr Rayann Heman   APPENDECTOMY  1978   ATRIAL FIBRILLATION ABLATION N/A 10/28/2013   Procedure: ATRIAL FIBRILLATION ABLATION;  Surgeon: Coralyn Mark, MD;  Location: [facility] CATH LAB;  Service: Cardiovascular;  Laterality: N/A;   ATRIAL FIBRILLATION ABLATION N/A 11/23/2020   Procedure: ATRIAL FIBRILLATION ABLATION;  Surgeon: Thompson Grayer, MD;  Location: [facility] CV LAB;  Service: Cardiovascular;  Laterality: N/A;   BACK SURGERY Bilateral 2019   Fusion   CARDIAC CATHETERIZATION  07/2013   CARDIOVERSION N/A 08/18/2013   Procedure: CARDIOVERSION;   Surgeon: Candee Furbish, MD;  Location: [facility];  Service: Cardiovascular;  Laterality: N/A;   CARDIOVERSION N/A 08/29/2013   Procedure: CARDIOVERSION;  Surgeon: Fay Records, MD;  Location: [place];  Service: Cardiovascular;  Laterality: N/A;   ELECTROPHYSIOLOGIC STUDY N/A 11/05/2014   Procedure: Atrial Fibrillation Ablation;  Surgeon: Thompson Grayer, MD;  Location: [facility] CV LAB;  Service: Cardiovascular;  Laterality: N/A;   EYE SURGERY Bilateral 1999   Corrective laser surgery   implantable loop recorder removal  10/02/2018   MDT Reveal LINQ removed in office by Dr Rayann Heman for EOL battery   IR SACROPLASTY BILATERAL  02/06/2019   LEFT HEART CATHETERIZATION WITH CORONARY ANGIOGRAM N/A 07/17/2013   Procedure: LEFT HEART CATHETERIZATION WITH CORONARY  Rosalin Hawking;  Surgeon: Micheline Chapman, MD;  Location: [facility] CATH LAB;  Service: Cardiovascular;  Laterality: N/A;   LOOP RECORDER IMPLANT N/A 06/05/2014   Procedure: LOOP RECORDER IMPLANT;  Surgeon: Hillis Range, MD;  Location: [facility] CATH LAB;  Service: Cardiovascular;  Laterality: N/A;   TEE WITHOUT CARDIOVERSION N/A 07/18/2013   Procedure: TRANSESOPHAGEAL ECHOCARDIOGRAM (TEE);  Surgeon: Lewayne Bunting, MD;  Location: [facility] ENDOSCOPY;  Service: Cardiovascular;  Laterality: N/A;   TEE WITHOUT CARDIOVERSION N/A 08/18/2013   Procedure: TRANSESOPHAGEAL ECHOCARDIOGRAM (TEE);  Surgeon: Donato Schultz, MD;  Location: [facility] ENDOSCOPY;  Service: Cardiovascular;  Laterality: N/A;   TEE WITHOUT CARDIOVERSION N/A 10/27/2013   Procedure: TRANSESOPHAGEAL ECHOCARDIOGRAM (TEE);  Surgeon: Pricilla Riffle, MD;  Location: [facility] ENDOSCOPY;  Service: Cardiovascular;  Laterality: N/A;   TEE WITHOUT CARDIOVERSION N/A 11/04/2014   Procedure: TRANSESOPHAGEAL ECHOCARDIOGRAM (TEE);  Surgeon: Chrystie Nose, MD;  Location: [facility] ENDOSCOPY;  Service: Cardiovascular;  Laterality: N/A;     Allergies  Allergen Reactions   Azithromycin Diarrhea and Nausea And Vomiting   Other Nausea And Vomiting and Other  (See Comments)    WEGOVY   Tape Rash    Surgical     Current Outpatient Medications  Medication Instructions   acetaminophen (TYLENOL) 1,000-2,000 mg, Oral, Every 6 hours PRN   atorvastatin (LIPITOR) 40 mg, Oral, Daily   buPROPion (WELLBUTRIN XL) 150 mg, Oral, Daily   cyanocobalamin 5,000 mcg, Oral, Daily, One daily   ELIQUIS 5 MG TABS tablet TAKE 1 TABLET BY MOUTH TWICE A DAY   flecainide (TAMBOCOR) 100 MG tablet TAKE 1 TABLET BY MOUTH TWICE A DAY   furosemide (LASIX) 20 MG tablet TAKE 1 TABLET BY MOUTH EVERY OTHER DAY   hydrOXYzine (ATARAX) 25 mg, Oral, Daily at bedtime   KLOR-CON M10 10 MEQ tablet TAKE 1 TABLET (10 MEQ TOTAL) BY MOUTH EVERY OTHER DAY. WHEN TAKING LASIX   lisinopril (ZESTRIL) 5 MG tablet TAKE 1 TABLET BY MOUTH EVERY DAY   metoprolol tartrate (LOPRESSOR) 12.5 mg, Oral, As directed   Multiple Vitamin (MULTIVITAMIN) tablet 1 tablet, Oral, Daily, Woman's 50 +   nystatin-triamcinolone (MYCOLOG II) cream 1 application , Topical, 2 times daily PRN   pramipexole (MIRAPEX) 0.25-0.5 mg, Oral, Daily at bedtime   sertraline (ZOLOFT) 200 mg, BH-each morning   vitamin D3 (CHOLECALCIFEROL) 1,000 Units, Oral, Daily     VITALS:   Vitals:   11/28/21 0803  BP: 131/67  Pulse: 64  SpO2: 94%  Height: 5\' 6"  (1.676 m)       No data to display          PHYSICAL EXAM   HEENT:  Normocephalic, atraumatic. The mucous membranes are moist. The superficial temporal arteries are without ropiness or tenderness. Cardiovascular: Regular rate and rhythm. Lungs: Clear to auscultation bilaterally. Neck: There are no carotid bruits noted bilaterally.  NEUROLOGICAL:    11/28/2021   10:00 AM  Montreal Cognitive Assessment   Visuospatial/ Executive (0/5) 4  Naming (0/3) 3  Attention: Read list of digits (0/2) 2  Attention: Read list of letters (0/1) 1  Attention: Serial 7 subtraction starting at 100 (0/3) 3  Language: Repeat phrase (0/2) 2  Language : Fluency (0/1) 0  Abstraction  (0/2) 1  Delayed Recall (0/5) 3  Orientation (0/6) 6  Total 25  Adjusted Score (based on education) 25        No data to display           Orientation:  Alert and oriented  to person, place and time. No aphasia or dysarthria. Fund of knowledge is appropriate. Recent memory impaired and remote memory intact.  Attention and concentration are normal.  Able to name objects and repeat phrases. Delayed recall 3/5 Cranial nerves: There is good facial symmetry. Extraocular muscles are intact and visual fields are full to confrontational testing. Speech is fluent and clear. Soft palate rises symmetrically and there is no tongue deviation. Hearing is intact to conversational tone. Tone: Tone is good throughout. Sensation: Sensation is intact to light touch and pinprick throughout. Vibration is intact at the bilateral big toe.There is no extinction with double simultaneous stimulation. There is no sensory dermatomal level identified. Coordination: The patient has no difficulty with RAM's or FNF bilaterally. Normal finger to nose  Motor: Strength is 5/5 in the bilateral upper and lower extremities. There is no pronator drift. There are no fasciculations noted. Gait and Station: The patient is able to ambulate without difficulty.The patient is able to heel toe walk without any difficulty.The patient is able to ambulate in a tandem fashion. The patient is able to stand in the Romberg position.     Thank you for allowing [place] the opportunity to participate in the care of this nice patient. Please do not hesitate to contact us for any questions or concerns.   Total time spent on today's visit was 60 minutes dedicated to this patient today, preparing to see patient, examining the patient, ordering tests and/or medications and counseling the patient, documenting clinical information in the EHR or other health record, independently interpreting results and communicating results to the patient/family, discussing  treatment and goals, answering patient's questions and coordinating care.  Cc:  [name], MD  Marlowe Kays 11/28/2021 10:24 AM

## 2021-11-28 ENCOUNTER — Ambulatory Visit: Payer: BC Managed Care – PPO | Admitting: Physician Assistant

## 2021-11-28 ENCOUNTER — Encounter: Payer: Self-pay | Admitting: Physician Assistant

## 2021-11-28 VITALS — BP 131/67 | HR 64 | Ht 66.0 in

## 2021-11-28 DIAGNOSIS — G478 Other sleep disorders: Secondary | ICD-10-CM

## 2021-11-28 DIAGNOSIS — G4752 REM sleep behavior disorder: Secondary | ICD-10-CM

## 2021-11-28 DIAGNOSIS — F02818 Dementia in other diseases classified elsewhere, unspecified severity, with other behavioral disturbance: Secondary | ICD-10-CM

## 2021-11-28 DIAGNOSIS — G3183 Dementia with Lewy bodies: Secondary | ICD-10-CM | POA: Diagnosis not present

## 2021-11-28 DIAGNOSIS — Z820 Family history of epilepsy and other diseases of the nervous system: Secondary | ICD-10-CM | POA: Diagnosis not present

## 2021-11-28 NOTE — Patient Instructions (Addendum)
It was a pleasure to see you today at our office.   Recommendations:  Neurocognitive evaluation at our office MRI of the brain, the radiology office will call you to arrange you appointment Follow up in 1  month Sleep study for REM behavior    Whom to call:  Memory  decline, memory medications: Call our office [phone]   For psychiatric meds, mood meds: Please have your primary care physician manage these medications.   Counseling regarding caregiver distress, including caregiver depression, anxiety and issues regarding community resources, adult day care programs, adult living facilities, or memory care questions:   Feel free to contact Misty Lisabeth Register, Social Worker at [phone]   For assessment of decision of mental capacity and competency:  Call Dr. Erick Blinks, geriatric psychiatrist at [phone]  For guidance in geriatric dementia issues please call [facility] [phone]  For guidance regarding [facility] and if placement were needed at the facility, contact Sidney Ace, Social Worker tel: [phone]  If you have any severe symptoms of a stroke, or other severe issues such as confusion,severe chills or fever, etc call 911 or go to the ER as you may need to be evaluated further   Feel free to visit Facebook page " [employer]" for tips of how to care for people with memory problems.   Feel free to go to the following database for funded clinical studies conducted around the world: RankChecks.se   https://www.triadclinicaltrials.com/     RECOMMENDATIONS FOR ALL PATIENTS WITH MEMORY PROBLEMS: 1. Continue to exercise (Recommend 30 minutes of walking everyday, or 3 hours every week) 2. Increase social interactions - continue going to [place] and enjoy social gatherings with friends and family 3. Eat healthy, avoid fried foods and eat more fruits and vegetables 4. Maintain adequate blood pressure, blood sugar,  and blood cholesterol level. Reducing the risk of stroke and cardiovascular disease also helps promoting better memory. 5. Avoid stressful situations. Live a simple life and avoid aggravations. Organize your time and prepare for the next day in anticipation. 6. Sleep well, avoid any interruptions of sleep and avoid any distractions in the bedroom that may interfere with adequate sleep quality 7. Avoid sugar, avoid sweets as there is a strong link between excessive sugar intake, diabetes, and cognitive impairment We discussed the Mediterranean diet, which has been shown to help patients reduce the risk of progressive memory disorders and reduces cardiovascular risk. This includes eating fish, eat fruits and green leafy vegetables, nuts like almonds and hazelnuts, walnuts, and also use olive oil. Avoid fast foods and fried foods as much as possible. Avoid sweets and sugar as sugar use has been linked to worsening of memory function.  There is always a concern of gradual progression of memory problems. If this is the case, then we may need to adjust level of care according to patient needs. Support, both to the patient and caregiver, should then be put into place.      You have been referred for a neuropsychological evaluation (i.e., evaluation of memory and thinking abilities). Please bring someone with you to this appointment if possible, as it is helpful for the doctor to hear from both you and another adult who knows you well. Please bring eyeglasses and hearing aids if you wear them.    The evaluation will take approximately 3 hours and has two parts:   The first part is a clinical interview with the neuropsychologist (Dr. Milbert Coulter or Dr. Roseanne Reno). During the interview,  the neuropsychologist will speak with you and the individual you brought to the appointment.    The second part of the evaluation is testing with the doctor's technician Hinton Dyer or Maudie Mercury). During the testing, the technician will ask you to  remember different types of material, solve problems, and answer some questionnaires. Your family member will not be present for this portion of the evaluation.   Please note: We must reserve several hours of the neuropsychologist's time and the psychometrician's time for your evaluation appointment. As such, there is a No-Show fee of $100. If you are unable to attend any of your appointments, please contact our office as soon as possible to reschedule.    FALL PRECAUTIONS: Be cautious when walking. Scan the area for obstacles that may increase the risk of trips and falls. When getting up in the mornings, sit up at the edge of the bed for a few minutes before getting out of bed. Consider elevating the bed at the head end to avoid drop of blood pressure when getting up. Walk always in a well-lit room (use night lights in the walls). Avoid area rugs or power cords from appliances in the middle of the walkways. Use a walker or a cane if necessary and consider physical therapy for balance exercise. Get your eyesight checked regularly.  FINANCIAL OVERSIGHT: Supervision, especially oversight when making financial decisions or transactions is also recommended.  HOME SAFETY: Consider the safety of the kitchen when operating appliances like stoves, microwave oven, and blender. Consider having supervision and share cooking responsibilities until no longer able to participate in those. Accidents with firearms and other hazards in the house should be identified and addressed as well.   ABILITY TO BE LEFT ALONE: If patient is unable to contact 911 operator, consider using LifeLine, or when the need is there, arrange for someone to stay with patients. Smoking is a fire hazard, consider supervision or cessation. Risk of wandering should be assessed by caregiver and if detected at any point, supervision and safe proof recommendations should be instituted.  MEDICATION SUPERVISION: Inability to self-administer  medication needs to be constantly addressed. Implement a mechanism to ensure safe administration of the medications.   DRIVING: Regarding driving, in patients with progressive memory problems, driving will be impaired. We advise to have someone else do the driving if trouble finding directions or if minor accidents are reported. Independent driving assessment is available to determine safety of driving.   If you are interested in the driving assessment, you can contact the following:  [employer] in [place]  [place] [phone] or [phone]    [place] refers to food and lifestyle choices that are based on the traditions of countries located on the [employer]. This way of eating has been shown to help prevent certain conditions and improve outcomes for people who have chronic diseases, like kidney disease and heart disease. What are tips for following this plan? Lifestyle  Cook and eat meals together with your family, when possible. Drink enough fluid to keep your urine clear or pale yellow. Be physically active every day. This includes: Aerobic exercise like running or swimming. Leisure activities like gardening, walking, or housework. Get 7-8 hours of sleep each night. If recommended by your health care provider, drink red wine in moderation. This means 1 glass a day for nonpregnant women and 2 glasses a day for men. A glass of wine  equals 5 oz (150 mL). Reading food labels  Check the serving size of packaged foods. For foods such as rice and pasta, the serving size refers to the amount of cooked product, not dry. Check the total fat in packaged foods. Avoid foods that have saturated fat or trans fats. Check the ingredients list for added sugars, such as corn syrup. Shopping  At the grocery store, buy most of your food  from the areas near the walls of the store. This includes: Fresh fruits and vegetables (produce). Grains, beans, nuts, and seeds. Some of these may be available in unpackaged forms or large amounts (in bulk). Fresh seafood. Poultry and eggs. Low-fat dairy products. Buy whole ingredients instead of prepackaged foods. Buy fresh fruits and vegetables in-season from local farmers markets. Buy frozen fruits and vegetables in resealable bags. If you do not have access to quality fresh seafood, buy precooked frozen shrimp or canned fish, such as tuna, salmon, or sardines. Buy small amounts of raw or cooked vegetables, salads, or olives from the deli or salad bar at your store. Stock your pantry so you always have certain foods on hand, such as olive oil, canned tuna, canned tomatoes, rice, pasta, and beans. Cooking  Cook foods with extra-virgin olive oil instead of using butter or other vegetable oils. Have meat as a side dish, and have vegetables or grains as your main dish. This means having meat in small portions or adding small amounts of meat to foods like pasta or stew. Use beans or vegetables instead of meat in common dishes like chili or lasagna. Experiment with different cooking methods. Try roasting or broiling vegetables instead of steaming or sauteing them. Add frozen vegetables to soups, stews, pasta, or rice. Add nuts or seeds for added healthy fat at each meal. You can add these to yogurt, salads, or vegetable dishes. Marinate fish or vegetables using olive oil, lemon juice, garlic, and fresh herbs. Meal planning  Plan to eat 1 vegetarian meal one day each week. Try to work up to 2 vegetarian meals, if possible. Eat seafood 2 or more times a week. Have healthy snacks readily available, such as: Vegetable sticks with hummus. Greek yogurt. Fruit and nut trail mix. Eat balanced meals throughout the week. This includes: Fruit: 2-3 servings a day Vegetables: 4-5 servings a  day Low-fat dairy: 2 servings a day Fish, poultry, or lean meat: 1 serving a day Beans and legumes: 2 or more servings a week Nuts and seeds: 1-2 servings a day Whole grains: 6-8 servings a day Extra-virgin olive oil: 3-4 servings a day Limit red meat and sweets to only a few servings a month What are my food choices? Mediterranean diet Recommended Grains: Whole-grain pasta. Brown rice. Bulgar wheat. Polenta. Couscous. Whole-wheat bread. [place]. Vegetables: Artichokes. Beets. Broccoli. Cabbage. Carrots. Eggplant. Green beans. Chard. Kale. Spinach. Onions. Leeks. Peas. Squash. Tomatoes. Peppers. Radishes. Fruits: Apples. Apricots. Avocado. Berries. Bananas. Cherries. Dates. Figs. Grapes. Lemons. Melon. Oranges. Peaches. Plums. Pomegranate. Meats and other protein foods: Beans. Almonds. Sunflower seeds. Pine nuts. Peanuts. [place]. Salmon. Scallops. Shrimp. [place]. Tilapia. Clams. Oysters. Eggs. Dairy: Low-fat milk. Cheese. Greek yogurt. Beverages: Water. Red wine. Herbal tea. Fats and oils: Extra virgin olive oil. Avocado oil. Grape seed oil. Sweets and desserts: [place] yogurt with honey. Baked apples. Poached pears. Trail mix. Seasoning and other foods: Basil. Cilantro. Coriander. Cumin. Mint. Parsley. Sage. Rosemary. Tarragon. Garlic. Oregano. Thyme. Pepper. Balsalmic vinegar. Tahini. Hummus. Tomato sauce. Olives. Mushrooms. Limit these Grains: Prepackaged pasta or  rice dishes. Prepackaged cereal with added sugar. Vegetables: Deep fried potatoes (french fries). Fruits: Fruit canned in syrup. Meats and other protein foods: Beef. Pork. Lamb. Poultry with skin. Hot dogs. Tomasa Blase. Dairy: Ice cream. Sour cream. Whole milk. Beverages: Juice. Sugar-sweetened soft drinks. Beer. Liquor and spirits. Fats and oils: Butter. Canola oil. Vegetable oil. Beef fat (tallow). Lard. Sweets and desserts: Cookies. Cakes. Pies. Candy. Seasoning and other foods: Mayonnaise. Premade sauces and marinades. The  items listed may not be a complete list. Talk with your dietitian about what dietary choices are right for you. Summary The Mediterranean diet includes both food and lifestyle choices. Eat a variety of fresh fruits and vegetables, beans, nuts, seeds, and whole grains. Limit the amount of red meat and sweets that you eat. Talk with your health care provider about whether it is safe for you to drink red wine in moderation. This means 1 glass a day for nonpregnant women and 2 glasses a day for men. A glass of wine equals 5 oz (150 mL). This information is not intended to replace advice given to you by your health care provider. Make sure you discuss any questions you have with your health care provider. Document Released: 09/23/2015 Document Revised: 10/26/2015 Document Reviewed: 09/23/2015 Elsevier Interactive Patient Education  2017 [employer].

## 2021-12-02 ENCOUNTER — Telehealth: Payer: Self-pay

## 2021-12-02 NOTE — Telephone Encounter (Signed)
Nocturnal polysomnography was approved order number [id number]/10/20-12/18/23.

## 2021-12-05 ENCOUNTER — Other Ambulatory Visit: Payer: Self-pay | Admitting: Internal Medicine

## 2021-12-06 NOTE — Telephone Encounter (Signed)
Prescription refill request for Eliquis received. Indication:Afib Last office visit:2/23 Scr:0.7 Age: 61 Weight:89.8 kg  Prescription refilled

## 2021-12-16 ENCOUNTER — Ambulatory Visit
Admission: RE | Admit: 2021-12-16 | Discharge: 2021-12-16 | Disposition: A | Discharge status: Home / Self Care | Payer: BC Managed Care – PPO | Source: Ambulatory Visit | Attending: Physician Assistant | Admitting: Physician Assistant

## 2021-12-16 DIAGNOSIS — G4752 REM sleep behavior disorder: Secondary | ICD-10-CM

## 2021-12-16 DIAGNOSIS — F02818 Dementia in other diseases classified elsewhere, unspecified severity, with other behavioral disturbance: Secondary | ICD-10-CM

## 2021-12-16 DIAGNOSIS — Z820 Family history of epilepsy and other diseases of the nervous system: Secondary | ICD-10-CM

## 2021-12-16 MED ORDER — GADOPICLENOL 0.5 MMOL/ML IV SOLN
9.0000 mL | Freq: Once | INTRAVENOUS | Status: AC | PRN
  Administered 2021-12-16: 9 mL via INTRAVENOUS

## 2021-12-22 ENCOUNTER — Encounter: Payer: Self-pay | Admitting: Psychology

## 2021-12-22 DIAGNOSIS — Z87410 Personal history of cervical dysplasia: Secondary | ICD-10-CM

## 2021-12-22 HISTORY — DX: Personal history of cervical dysplasia: Z87.410

## 2021-12-23 ENCOUNTER — Ambulatory Visit (INDEPENDENT_AMBULATORY_CARE_PROVIDER_SITE_OTHER): Payer: BC Managed Care – PPO | Admitting: Psychology

## 2021-12-23 ENCOUNTER — Encounter: Payer: Self-pay | Admitting: Psychology

## 2021-12-23 ENCOUNTER — Ambulatory Visit: Payer: BC Managed Care – PPO

## 2021-12-23 DIAGNOSIS — F411 Generalized anxiety disorder: Secondary | ICD-10-CM

## 2021-12-23 DIAGNOSIS — F331 Major depressive disorder, recurrent, moderate: Secondary | ICD-10-CM

## 2021-12-23 DIAGNOSIS — F09 Unspecified mental disorder due to known physiological condition: Secondary | ICD-10-CM

## 2021-12-23 DIAGNOSIS — R4189 Other symptoms and signs involving cognitive functions and awareness: Secondary | ICD-10-CM

## 2021-12-23 NOTE — Progress Notes (Signed)
   Psychometrician Note   Cognitive testing was administered to Kiara [name] by Shan Levans, B.S. (psychometrist) under the supervision of Dr. Newman Nickels, Ph.D., licensed psychologist on 12/23/2021. Kiara [name] did not appear overtly distressed by the testing session per behavioral observation or responses across self-report questionnaires. Rest breaks were offered.    The battery of tests administered was selected by Dr. Newman Nickels, Ph.D. with consideration to Kiara [name]'s current level of functioning, the nature of her symptoms, emotional and behavioral responses during interview, level of literacy, observed level of motivation/effort, and the nature of the referral question. This battery was communicated to the psychometrist. Communication between Dr. Newman Nickels, Ph.D. and the psychometrist was ongoing throughout the evaluation and Dr. Newman Nickels, Ph.D. was immediately accessible at all times. Dr. Newman Nickels, Ph.D. provided supervision to the psychometrist on the date of this service to the extent necessary to assure the quality of all services provided.    Kiara [name] will return within approximately 1-2 weeks for an interactive feedback session with Dr. Milbert Coulter at which time her test performances, clinical impressions, and treatment recommendations will be reviewed in detail. Kiara [name] understands she can contact our office should she require our assistance before this time.  A total of 150 minutes of billable time were spent face-to-face with Kiara [name] by the psychometrist. This includes both test administration and scoring time. Billing for these services is reflected in the clinical report generated by Dr. Newman Nickels, Ph.D.  This note reflects time spent with the psychometrician and does not include test scores or any clinical interpretations made by Dr. Milbert Coulter. The full report will follow in a separate note.

## 2021-12-23 NOTE — Progress Notes (Signed)
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 shape learning task. Performance across recognition tasks was below average to average, suggesting evidence for information consolidation.   Results of emotional screening instruments suggested that recent symptoms of generalized anxiety were in the severe range, while symptoms of depression were also within the severe range. A screening instrument assessing recent sleep quality suggested the presence of moderate sleep dysfunction.  Tables of Scores:   Note: This summary of test scores accompanies the interpretive report and should not be considered in isolation without reference to the appropriate sections in the text. Descriptors are based on appropriate normative data and may be adjusted based on clinical judgment. Terms such as "Within Normal  Limits" and "Outside Normal Limits" are used when a more specific description of the test score cannot be determined.       Percentile - Normative Descriptor > 98 - Exceptionally High 91-97 - Well Above Average 75-90 - Above Average 25-74 - Average 9-24 - Below Average 2-8 - Well Below Average < 2 - Exceptionally Low       Orientation:      Raw Score Percentile   NAB Orientation, Form 1 29/29 --- ---       Cognitive Screening:      Raw Score Percentile   SLUMS: 30/30 --- ---       Intellectual Functioning:      Standard Score Percentile   Test of Premorbid Functioning: 116 86 Above Average       Memory:     NAB Memory Module, Form 1: Standard Score/ T Score Percentile   Total Memory Index 90 25 Average  List Learning       Total Trials 1-3 22/36 (41) 18 Below Average    Short Delay Free Recall 9/12 (52) 58 Average    Long Delay Free Recall 8/12 (48) 42 Average    Retention Percentage 89 (46) 34 Average    Recognition Discriminability 4 (38) 12 Below Average  Shape Learning       Total Trials 1-3 16/27 (48) 42 Average    Delayed Recall 6/9 (48) 42 Average    Retention Percentage 75 (42) 21 Below Average    Recognition Discriminability 8 (56) 73 Average  Story Learning       Immediate Recall 67/80 (50) 50 Average    Delayed Recall 37/40 (55) 69 Average    Retention Percentage 93 (50) 50 Average  Daily Living Memory       Immediate Recall 40/51 (40) 16 Below Average    Delayed Recall 11/17 (30) 2 Well Below Average    Retention Percentage 73 (39) 14 Below Average    Recognition Hits 8/10 (42) 21 Below Average  Attention/Executive Function:     Trail Making Test (TMT): Raw Score (T Score) Percentile     Part A 35 secs.,  0 errors (44) 27 Average    Part B 107 secs.,  2 errors (36) 8 Well Below Average         Scaled Score Percentile   WAIS-IV Coding: 9 37 Average       NAB Attention Module, Form 1: T Score Percentile     Digits Forward 37 9 Below Average     Digits Backwards 49 46 Average       D-KEFS Color-Word Interference Test: Raw Score (Scaled Score) Percentile     Color Naming 32 secs. (10) 50 Average    Word Reading 23 secs. (11) 63 Average    Inhibition 85 secs. (7) 16 Below Average      Total Errors 4 errors (8) 25 Average    Inhibition/Switching 75 secs. (10) 50 Average      Total Errors 2 errors (11) 63 Average       D-KEFS Verbal Fluency Test: Raw Score (Scaled Score) Percentile     Letter Total Correct 41 (12) 75 Above Average    Category Total Correct 30 (8) 25 Average    Category Switching Total Correct 7 (3) 1 Exceptionally Low    Category Switching Accuracy 5 (4) 2 Well Below Average      Total Set Loss Errors 1 (11) 63 Average      Total Repetition Errors 0 (13) 84 Above Average       NAB Executive Functions Module, Form 1: T Score Percentile     Judgment 57 75 Above Average       Language:     Verbal Fluency Test: Raw Score (T Score) Percentile     Phonemic Fluency (FAS) 41 (45) 31 Average    Animal Fluency 15 (38) 12 Below Average        NAB Language Module, Form 1: T Score Percentile     Naming 31/31 (55) 69 Average       Visuospatial/Visuoconstruction:      Raw Score Percentile   Clock Drawing: 10/10 --- Within Normal Limits        Raw Score Percentile   RBANS Line Orientation: 19/20 >75 Above Average       NAB Spatial Module, Form 1: T Score Percentile     Visual Discrimination 49 46 Average    Figure Drawing Copy 70 98 Exceptionally High        Scaled Score Percentile   WAIS-IV Block Design: 8 25 Average       Mood and Personality:      Raw Score Percentile   Beck Depression Inventory - II: 35 --- Severe  PROMIS Anxiety Questionnaire: 30 --- Severe       Additional Questionnaires:      Raw Score Percentile   PROMIS Sleep Disturbance Questionnaire: 34 --- Moderate   Informed Consent and Coding/Compliance:   The current evaluation represents a clinical evaluation for the purposes  previously outlined by the referral source and is in no way reflective of a forensic evaluation.   Ms. Dawn was provided with a verbal description of the nature and purpose of the present neuropsychological evaluation. Also reviewed were the foreseeable risks and/or discomforts and benefits of the procedure, limits of confidentiality, and mandatory reporting requirements of this provider. The patient was given the opportunity to ask questions and receive answers about the evaluation. Oral consent to participate was  provided by the patient.   This evaluation was conducted by Christia Reading, Ph.D., ABPP-CN, board certified clinical neuropsychologist. Ms. Tamplin completed a clinical interview with Dr. Melvyn Novas, billed as one unit [phone], and 150 minutes of cognitive testing and scoring, billed as one unit [phone] and four additional units [id number]. Psychometrist Cruzita Lederer, B.S., assisted Dr. Melvyn Novas with test administration and scoring procedures. As a separate and discrete service, Dr. Melvyn Novas spent a total of 160 minutes in interpretation and report writing billed as one unit [phone] and two units 96133.

## 2021-12-25 NOTE — Progress Notes (Signed)
MRI brain shows chronic vascular changes, age related changes, no acute findings, thanks

## 2021-12-29 ENCOUNTER — Ambulatory Visit: Payer: BC Managed Care – PPO | Admitting: Psychology

## 2021-12-29 DIAGNOSIS — F411 Generalized anxiety disorder: Secondary | ICD-10-CM | POA: Diagnosis not present

## 2021-12-29 DIAGNOSIS — F09 Unspecified mental disorder due to known physiological condition: Secondary | ICD-10-CM

## 2021-12-29 DIAGNOSIS — F331 Major depressive disorder, recurrent, moderate: Secondary | ICD-10-CM

## 2021-12-29 NOTE — Progress Notes (Signed)
   Neuropsychology Feedback Session [name]. [facility] [place] Department of Neurology  Reason for Referral:   Kiara [name] is a 61 y.o. left-handed Caucasian female referred by Marlowe Kays, PA-C, to characterize her current cognitive functioning and assist with diagnostic clarity and treatment planning in the context of subjective cognitive decline.   Feedback:   [name] completed a comprehensive neuropsychological evaluation on 12/23/2021. Please refer to that encounter for the full report and recommendations. Briefly, results suggested performance variability but overall weakness across executive functioning. Performances were appropriate relative to age-matched peers across all other assessed cognitive domains. Currently, the most likely etiology for reported day-to-day dysfunction surrounds a combination of psychiatric and medical variables. Psychiatrically, [name] reported severe levels of anxiety and depression occurring during the past 1-2 weeks. She reported the acute passing of her father two weeks prior to the current evaluation, as well as the passing of her mother about a year prior as primary sources of distress. Additionally, she has numerous cardiovascular and other medical ailments (e.g., atrial fibrillation, coronary artery disease, hypertension, cardiomyopathy, hypercholesteremia) which can negatively impact cognitive performances. She also reported ongoing sleep dysfunction. Her weakness in executive functioning across the current evaluation can certainly be attributed to the presence and severity of these symptoms.   [name] was accompanied by her husband during the current feedback session. Content of the current session focused on the results of her neuropsychological evaluation. [name] was given the opportunity to ask questions and her questions were answered. She was encouraged to reach out should additional questions arise. A copy of her  report was provided at the conclusion of the visit.      25 minutes were spent conducting the current feedback session with [name], billed as one unit [phone].

## 2022-01-02 ENCOUNTER — Ambulatory Visit (HOSPITAL_BASED_OUTPATIENT_CLINIC_OR_DEPARTMENT_OTHER): Payer: BC Managed Care – PPO | Attending: Physician Assistant | Admitting: Internal Medicine

## 2022-01-02 ENCOUNTER — Ambulatory Visit: Payer: BC Managed Care – PPO | Admitting: Physician Assistant

## 2022-01-02 VITALS — Wt 204.0 lb

## 2022-01-02 DIAGNOSIS — F02818 Dementia in other diseases classified elsewhere, unspecified severity, with other behavioral disturbance: Secondary | ICD-10-CM | POA: Insufficient documentation

## 2022-01-02 DIAGNOSIS — G3183 Dementia with Lewy bodies: Secondary | ICD-10-CM | POA: Diagnosis not present

## 2022-01-02 DIAGNOSIS — Z820 Family history of epilepsy and other diseases of the nervous system: Secondary | ICD-10-CM | POA: Insufficient documentation

## 2022-01-02 DIAGNOSIS — I493 Ventricular premature depolarization: Secondary | ICD-10-CM | POA: Insufficient documentation

## 2022-01-02 DIAGNOSIS — G4752 REM sleep behavior disorder: Secondary | ICD-10-CM | POA: Insufficient documentation

## 2022-01-02 DIAGNOSIS — R0902 Hypoxemia: Secondary | ICD-10-CM | POA: Diagnosis not present

## 2022-01-02 DIAGNOSIS — G478 Other sleep disorders: Secondary | ICD-10-CM | POA: Diagnosis not present

## 2022-01-10 ENCOUNTER — Telehealth: Payer: Self-pay | Admitting: Physician Assistant

## 2022-01-10 ENCOUNTER — Ambulatory Visit: Payer: BC Managed Care – PPO | Admitting: Physician Assistant

## 2022-01-10 NOTE — Telephone Encounter (Signed)
We cancelled appt for today, but they will be seen after 01/27/2022 per there request to settle up visit fyi.

## 2022-01-10 NOTE — Telephone Encounter (Signed)
It is ok not to come today, based on the results from the testing she needs to follow up in 1 year with [place] and do the sleep studies and mood evaluation etc with PCP thanks

## 2022-01-10 NOTE — Telephone Encounter (Signed)
Pt's husband called in wanting to make sure they should come in for their appt today? They don't have the sleep study results yet.

## 2022-01-10 NOTE — Progress Notes (Incomplete)
Assessment/Plan:    Kiara [name] is a very pleasant 61 y.o. RH female with a history of hypertension, hyperlipidemia, moderate MR, mild CAD, chronic systolic heart failure, atrial fibrillation, history of sinus bradycardia, history of right thyroid nodule, history of asthma, depression, vitamin D and B12 deficiency, history of spinal stenosis with several back surgeries, presenting today in follow-up for evaluation of memory difficulties.  She had a recent neuropsychological evaluation in November 2023, yielding the likely etiology to a combination of psychiatric and medical variables mentioned above as well as a component of insomnia. Suspicion for Alzheimer's Disease is low at this time.  Personally reviewed MRI of the brain 12/16/2021 was remarkable for mild microvascular ischemic changes, but no patterns of age advanced atrophy.  She is able to perform ADLs without difficulty    Cognitive Dysfunction   Follow up in 1 year Recommend referral to sleep study as per PCP for insomnia Repeat Neuropsychological evaluation in 24-36 months Recommend psychiatric referral as per PCP for anxiety and depression No antidementia medication is indicated at this time     Subjective:   This patient is accompanied in the office by ***  who supplements the history. Previous records as well as any outside records available were reviewed prior to todays visit.  ***She was last seen 11/28/2021, at which time her MoCA was 25/30    Any changes in memory since last visit? repeats oneself?  Endorsed Disoriented when walking into a room?  Patient denies   Leaving objects in unusual places?  Patient denies   Ambulates  with difficulty?   Patient denies   Recent falls?  Patient denies   Any head injuries?  Patient denies   History of seizures?   Patient denies   Wandering behavior?  Patient denies   Patient drives?  *** Any mood changes since last visit?  Patient denies   Any worsening depression?:   Patient denies   Hallucinations?  Patient denies   Paranoia?  Patient denies   Patient reports that sleeps well without vivid dreams, REM behavior or sleepwalking   History of sleep apnea?  Patient denies   Any hygiene concerns?  Patient denies   Independent of bathing and dressing?  Endorsed  Does the patient needs help with medications?  In charge *** Who is in charge of the finances?   is in charge   *** Any changes in appetite?  Patient denies ***   Patient have trouble swallowing? Patient denies   Does the patient cook?  Patient denies   Any kitchen accidents such as leaving the stove on? Patient denies   Any headaches?  Patient denies   Double vision? Patient denies   Any focal numbness or tingling?  Patient denies   Chronic back pain Patient denies   Unilateral weakness?  Patient denies   Any tremors?  Patient denies   Any history of anosmia?  Patient denies   Any incontinence of urine?  Patient denies   Any bowel dysfunction?   Patient denies      Patient lives  ***  Neuropsychological evaluation, Dr. Milbert Coulter, 12/23/2021" results suggested performance variability but overall weakness across executive functioning. Performances were appropriate relative to age-matched peers across all other assessed cognitive domains. Currently, the most likely etiology for reported day-to-day dysfunction surrounds a combination of psychiatric and medical variables. Psychiatrically, Kiara [name] reported severe levels of anxiety and depression occurring during the past 1-2 weeks. She reported the acute passing of her father two weeks  prior to the current evaluation, as well as the passing of her mother about a year prior as primary sources of distress. Additionally, she has numerous cardiovascular and other medical ailments (e.g., atrial fibrillation, coronary artery disease, hypertension, cardiomyopathy, hypercholesteremia) which can negatively impact cognitive performances. She also reported ongoing  sleep dysfunction. Her weakness in executive functioning across the current evaluation can certainly be attributed to the presence and severity of these symptoms".      Initial Visit 11/28/21  How long did patient have memory difficulties? For the last  9 months, she reports that when she is trying to say a sentence "there is a word that does not come out, cannot get the right word, and is usually at the end of the sentence ".  She compensates by saying to herself "if I have not used that word in a year, for example labs and more water, then it is okay not to remember.  ".  She also tries to do brain games.  She denies forgetting any recent conversations or names.  Long-term memory is good.   repeats oneself?  Endorsed, but she also attributes some component of hearing loss, she has an appointment for hearing studies. Disoriented when walking into a room?  Patient denies   Leaving objects in unusual places?  Patient denies   Ambulates  with difficulty?   Patient denies   Recent falls?  Patient denies   Any head injuries?  Patient denies   History of seizures?   Patient denies   Wandering behavior?  Patient denies   Patient drives?  No issues with driving. Any mood changes?  The patient has a history of clinical depression, has not been treated in years," has maximized the use of the depression medications given by her PCP, so she has been referred to a psychiatrist, she has an appointment soon "-husband says.  He also reports that her mental clarity and memory frustration appeared after her mother's death.  "During 5 years I was over preoccupied with taking care of my mother and I do not even know how I did it ".  Patient states that she has not been back to church, has been more introverted and reclusive, and she only goes to a prayer group because her husband leads it ".  Hallucinations?  Paranoia?  Patient denies   Patient reports that  She has REM behavior or sleepwalking   Patient reports vivid  dreams and screams, kicking and acting out.  Patient used to have vivid dreams before, but has become worse since her mother died.   Daughter is concerned that this could be a sign of Lewy body disease. History of sleep apnea?  Patient denies   Any hygiene concerns?  Patient denies   Independent of bathing and dressing?  Endorsed  Does the patient needs help with medications?  Patient is in charge   Who is in charge of the finances Husband is in charge   (he is a Psychologist, occupational) Any changes in appetite? Endorsed, weight  gain "inexplicable sweet craving for the last 4 months " Patient have trouble swallowing? Patient denies   Does the patient cook?  Patient denies   Any kitchen accidents such as leaving the stove on? Patient denies   Any headaches?  Patient denies   The double vision? Patient denies   Any focal numbness or tingling?  Patient denies   Chronic back pain Patient denies   Unilateral weakness?  Patient denies   Any tremors?  Patient denies   Any history of anosmia?  Patient denies   Any incontinence of urine?  Patient denies   Any bowel dysfunction?   Patient denies    History of heavy alcohol intake?  Only weekends, 1 can of beer at most History of heavy tobacco use?  Patient denies   Family history of dementia?  Her mother had "volatile AD " Patient lives with husband    Past Medical History:  Diagnosis Date   Atrial fibrillation    a. admx with AFib with RVR and acute systolic CHF 07/2013; TEE with LAA clot and no DCCV done;     Bilateral bunions 12/27/2020   Capsulitis of metatarsophalangeal (MTP) joint of right foot 01/31/2021   Chronic systolic heart failure    Coronary artery disease    a.  LHC (07/17/13):  LM 20%, ostial CFX 20-30%   Degenerative disc disease, lumbar 10/30/2017   Degenerative scoliosis 10/30/2017   Essential hypertension 09/10/2020   Fatigue 04/13/2014   Generalized anxiety disorder 09/22/2019   History of asthma 07/21/2013   History of cervical  dysplasia 12/22/2021   History of kidney stones 2013   History of lumbar spinal fusion 01/16/2019   Hypercholesterolemia 09/22/2019   Hyperlipemia    Hypothyroidism    past hx   Leg weakness, bilateral 11/28/2017   Long term (current) use of anticoagulants 10/30/2017   Loss of bladder control 11/28/2017   Lumbar radiculopathy 11/28/2017   Major depressive disorder 09/22/2019   Moderate mitral regurgitation 07/17/2013   NICM (nonischemic cardiomyopathy)    a. Echo (07/17/13):  EF 25-30%, EF subsequently normalized with sinus rhythm (tachycardia mediated CM)   Osteopenia    Overweight 07/18/2019   Sinus bradycardia 08/28/2013   Spondylolisthesis at L4-L5 level 10/30/2017     Past Surgical History:  Procedure Laterality Date   ABDOMINAL HYSTERECTOMY  ~ 2001   ABLATION  10/28/13   PVI by Dr Johney Frame   APPENDECTOMY  1978   ATRIAL FIBRILLATION ABLATION N/A 10/28/2013   Procedure: ATRIAL FIBRILLATION ABLATION;  Surgeon: Gardiner Rhyme, MD;  Location: [facility] CATH LAB;  Service: Cardiovascular;  Laterality: N/A;   ATRIAL FIBRILLATION ABLATION N/A 11/23/2020   Procedure: ATRIAL FIBRILLATION ABLATION;  Surgeon: Hillis Range, MD;  Location: [facility] INVASIVE CV LAB;  Service: Cardiovascular;  Laterality: N/A;   BACK SURGERY Bilateral 2019   Fusion   CARDIAC CATHETERIZATION  07/2013   CARDIOVERSION N/A 08/18/2013   Procedure: CARDIOVERSION;  Surgeon: Donato Schultz, MD;  Location: [facility] ENDOSCOPY;  Service: Cardiovascular;  Laterality: N/A;   CARDIOVERSION N/A 08/29/2013   Procedure: CARDIOVERSION;  Surgeon: Pricilla Riffle, MD;  Location: [facility] OR;  Service: Cardiovascular;  Laterality: N/A;   ELECTROPHYSIOLOGIC STUDY N/A 11/05/2014   Procedure: Atrial Fibrillation Ablation;  Surgeon: Hillis Range, MD;  Location: [facility] INVASIVE CV LAB;  Service: Cardiovascular;  Laterality: N/A;   EYE SURGERY Bilateral 1999   Corrective laser surgery   implantable loop recorder removal  10/02/2018   MDT Reveal LINQ removed in office by  Dr Johney Frame for EOL battery   IR SACROPLASTY BILATERAL  02/06/2019   LEFT HEART CATHETERIZATION WITH CORONARY ANGIOGRAM N/A 07/17/2013   Procedure: LEFT HEART CATHETERIZATION WITH CORONARY ANGIOGRAM;  Surgeon: Micheline Chapman, MD;  Location: [facility] CATH LAB;  Service: Cardiovascular;  Laterality: N/A;   LOOP RECORDER IMPLANT N/A 06/05/2014   Procedure: LOOP RECORDER IMPLANT;  Surgeon: Hillis Range, MD;  Location: [facility] CATH LAB;  Service: Cardiovascular;  Laterality: N/A;   TEE WITHOUT  CARDIOVERSION N/A 07/18/2013   Procedure: TRANSESOPHAGEAL ECHOCARDIOGRAM (TEE);  Surgeon: Lewayne Bunting, MD;  Location: [facility] ENDOSCOPY;  Service: Cardiovascular;  Laterality: N/A;   TEE WITHOUT CARDIOVERSION N/A 08/18/2013   Procedure: TRANSESOPHAGEAL ECHOCARDIOGRAM (TEE);  Surgeon: Donato Schultz, MD;  Location: [facility] ENDOSCOPY;  Service: Cardiovascular;  Laterality: N/A;   TEE WITHOUT CARDIOVERSION N/A 10/27/2013   Procedure: TRANSESOPHAGEAL ECHOCARDIOGRAM (TEE);  Surgeon: Pricilla Riffle, MD;  Location: [facility] ENDOSCOPY;  Service: Cardiovascular;  Laterality: N/A;   TEE WITHOUT CARDIOVERSION N/A 11/04/2014   Procedure: TRANSESOPHAGEAL ECHOCARDIOGRAM (TEE);  Surgeon: Chrystie Nose, MD;  Location: [facility] ENDOSCOPY;  Service: Cardiovascular;  Laterality: N/A;     PREVIOUS MEDICATIONS:   CURRENT MEDICATIONS:  Outpatient Encounter Medications as of 01/10/2022  Medication Sig   acetaminophen (TYLENOL) 500 MG tablet Take 1,000-2,000 mg by mouth every 6 (six) hours as needed for mild pain or headache.   atorvastatin (LIPITOR) 40 MG tablet Take 1 tablet (40 mg total) by mouth daily.   buPROPion (WELLBUTRIN XL) 150 MG 24 hr tablet Take 150 mg by mouth daily.   cyanocobalamin 2000 MCG tablet Take 5,000 mcg by mouth daily. One daily   ELIQUIS 5 MG TABS tablet TAKE 1 TABLET BY MOUTH TWICE A DAY   flecainide (TAMBOCOR) 100 MG tablet TAKE 1 TABLET BY MOUTH TWICE A DAY   furosemide (LASIX) 20 MG tablet TAKE 1 TABLET BY MOUTH EVERY OTHER DAY    hydrOXYzine (ATARAX/VISTARIL) 25 MG tablet Take 25 mg by mouth at bedtime.   KLOR-CON M10 10 MEQ tablet TAKE 1 TABLET (10 MEQ TOTAL) BY MOUTH EVERY OTHER DAY. WHEN TAKING LASIX   lisinopril (ZESTRIL) 5 MG tablet TAKE 1 TABLET BY MOUTH EVERY DAY   metoprolol tartrate (LOPRESSOR) 25 MG tablet TAKE 0.5 TABLETS (12.5 MG TOTAL) BY MOUTH AS DIRECTED. (Patient taking differently: Take 25 mg by mouth daily as needed (Afib).)   Multiple Vitamin (MULTIVITAMIN) tablet Take 1 tablet by mouth daily. Woman's 50 +   nystatin-triamcinolone (MYCOLOG II) cream Apply 1 application topically 2 (two) times daily as needed for rash.   pramipexole (MIRAPEX) 0.25 MG tablet Take 0.25-0.5 mg by mouth at bedtime.   sertraline (ZOLOFT) 100 MG tablet Take 200 mg by mouth every morning.    Vitamin D3 (VITAMIN D) 25 MCG tablet Take 1,000 Units by mouth daily.   Facility-Administered Encounter Medications as of 01/10/2022  Medication   0.9 %  sodium chloride infusion   0.9 %  sodium chloride infusion     Objective:     PHYSICAL EXAMINATION:    VITALS:  There were no vitals filed for this visit.  GEN:  The patient appears stated age and is in NAD. HEENT:  Normocephalic, atraumatic.   Neurological examination:  General: NAD, well-groomed, appears stated age. Orientation: The patient is alert. Oriented to person, place and date Cranial nerves: There is good facial symmetry.The speech is fluent and clear. No aphasia or dysarthria. Fund of knowledge is appropriate. Recent memory impaired and remote memory is normal.  Attention and concentration are normal.  Able to name objects and repeat phrases.  Hearing is intact to conversational tone.    Sensation: Sensation is intact to light touch throughout Motor: Strength is at least antigravity x4. Tremors: none  DTR's 2/4 in UE/LE      11/28/2021   10:00 AM  Montreal Cognitive Assessment   Visuospatial/ Executive (0/5) 4  Naming (0/3) 3  Attention: Read list of  digits (0/2) 2  Attention:  Read list of letters (0/1) 1  Attention: Serial 7 subtraction starting at 100 (0/3) 3  Language: Repeat phrase (0/2) 2  Language : Fluency (0/1) 0  Abstraction (0/2) 1  Delayed Recall (0/5) 3  Orientation (0/6) 6  Total 25  Adjusted Score (based on education) 25        No data to display             Movement examination: Tone: There is normal tone in the UE/LE Abnormal movements:  no tremor.  No myoclonus.  No asterixis.   Coordination:  There is no decremation with RAM's. Normal finger to nose  Gait and Station: The patient has no difficulty arising out of a deep-seated chair without the use of the hands. The patient's stride length is good.  Gait is cautious and narrow.   Thank you for allowing [place] the opportunity to participate in the care of this nice patient. Please do not hesitate to contact us for any questions or concerns.   Total time spent on today's visit was *** minutes dedicated to this patient today, preparing to see patient, examining the patient, ordering tests and/or medications and counseling the patient, documenting clinical information in the EHR or other health record, independently interpreting results and communicating results to the patient/family, discussing treatment and goals, answering patient's questions and coordinating care.  Cc:  [name], MD  Marlowe Kays 01/10/2022 7:23 AM

## 2022-01-10 NOTE — Progress Notes (Incomplete)
Assessment/Plan:    Kiara [name] is a very pleasant 61 y.o. RH female with a history of hypertension, hyperlipidemia, moderate MR, mild CAD, chronic systolic heart failure, atrial fibrillation, history of sinus bradycardia, history of right thyroid nodule, history of asthma, depression, vitamin D and B12 deficiency, history of spinal stenosis with several back surgeries, presenting today in follow-up for evaluation of memory difficulties.  She had a recent neuropsychological evaluation in November 2023, yielding the likely etiology to a combination of psychiatric and medical variables mentioned above as well as a component of insomnia. Suspicion for Alzheimer's Disease is low at this time.  Personally reviewed MRI of the brain 12/16/2021 was remarkable for mild microvascular ischemic changes, but no patterns of age advanced atrophy.  She is able to perform ADLs without difficulty    Cognitive Dysfunction   Follow up in 1 year Recommend referral to sleep study as per PCP for insomnia Repeat Neuropsychological evaluation in 24-36 months Recommend psychiatric referral as per PCP for anxiety and depression No antidementia medication is indicated at this time     Subjective:   This patient is accompanied in the office by ***  who supplements the history. Previous records as well as any outside records available were reviewed prior to todays visit.  ***She was last seen 11/28/2021, at which time her MoCA was 25/30    Any changes in memory since last visit? repeats oneself?  Endorsed Disoriented when walking into a room?  Patient denies   Leaving objects in unusual places?  Patient denies   Ambulates  with difficulty?   Patient denies   Recent falls?  Patient denies   Any head injuries?  Patient denies   History of seizures?   Patient denies   Wandering behavior?  Patient denies   Patient drives?  *** Any mood changes since last visit?  Patient denies   Any worsening depression?:   Patient denies   Hallucinations?  Patient denies   Paranoia?  Patient denies   Patient reports that sleeps well without vivid dreams, REM behavior or sleepwalking   History of sleep apnea?  Patient denies   Any hygiene concerns?  Patient denies   Independent of bathing and dressing?  Endorsed  Does the patient needs help with medications?  In charge *** Who is in charge of the finances?   is in charge   *** Any changes in appetite?  Patient denies ***   Patient have trouble swallowing? Patient denies   Does the patient cook?  Patient denies   Any kitchen accidents such as leaving the stove on? Patient denies   Any headaches?  Patient denies   Double vision? Patient denies   Any focal numbness or tingling?  Patient denies   Chronic back pain Patient denies   Unilateral weakness?  Patient denies   Any tremors?  Patient denies   Any history of anosmia?  Patient denies   Any incontinence of urine?  Patient denies   Any bowel dysfunction?   Patient denies      Patient lives  ***  Neuropsychological evaluation, Dr. Milbert Coulter, 12/23/2021" results suggested performance variability but overall weakness across executive functioning. Performances were appropriate relative to age-matched peers across all other assessed cognitive domains. Currently, the most likely etiology for reported day-to-day dysfunction surrounds a combination of psychiatric and medical variables. Psychiatrically, Kiara [name] reported severe levels of anxiety and depression occurring during the past 1-2 weeks. She reported the acute passing of her father two weeks  prior to the current evaluation, as well as the passing of her mother about a year prior as primary sources of distress. Additionally, she has numerous cardiovascular and other medical ailments (e.g., atrial fibrillation, coronary artery disease, hypertension, cardiomyopathy, hypercholesteremia) which can negatively impact cognitive performances. She also reported ongoing  sleep dysfunction. Her weakness in executive functioning across the current evaluation can certainly be attributed to the presence and severity of these symptoms".      Initial Visit 11/28/21  How long did patient have memory difficulties? For the last  9 months, she reports that when she is trying to say a sentence "there is a word that does not come out, cannot get the right word, and is usually at the end of the sentence ".  She compensates by saying to herself "if I have not used that word in a year, for example labs and more water, then it is okay not to remember.  ".  She also tries to do brain games.  She denies forgetting any recent conversations or names.  Long-term memory is good.   repeats oneself?  Endorsed, but she also attributes some component of hearing loss, she has an appointment for hearing studies. Disoriented when walking into a room?  Patient denies   Leaving objects in unusual places?  Patient denies   Ambulates  with difficulty?   Patient denies   Recent falls?  Patient denies   Any head injuries?  Patient denies   History of seizures?   Patient denies   Wandering behavior?  Patient denies   Patient drives?  No issues with driving. Any mood changes?  The patient has a history of clinical depression, has not been treated in years," has maximized the use of the depression medications given by her PCP, so she has been referred to a psychiatrist, she has an appointment soon "-husband says.  He also reports that her mental clarity and memory frustration appeared after her mother's death.  "During 5 years I was over preoccupied with taking care of my mother and I do not even know how I did it ".  Patient states that she has not been back to church, has been more introverted and reclusive, and she only goes to a prayer group because her husband leads it ".  Hallucinations?  Paranoia?  Patient denies   Patient reports that  She has REM behavior or sleepwalking   Patient reports vivid  dreams and screams, kicking and acting out.  Patient used to have vivid dreams before, but has become worse since her mother died.   Daughter is concerned that this could be a sign of Lewy body disease. History of sleep apnea?  Patient denies   Any hygiene concerns?  Patient denies   Independent of bathing and dressing?  Endorsed  Does the patient needs help with medications?  Patient is in charge   Who is in charge of the finances Husband is in charge   (he is a Psychologist, occupational) Any changes in appetite? Endorsed, weight  gain "inexplicable sweet craving for the last 4 months " Patient have trouble swallowing? Patient denies   Does the patient cook?  Patient denies   Any kitchen accidents such as leaving the stove on? Patient denies   Any headaches?  Patient denies   The double vision? Patient denies   Any focal numbness or tingling?  Patient denies   Chronic back pain Patient denies   Unilateral weakness?  Patient denies   Any tremors?  Patient denies   Any history of anosmia?  Patient denies   Any incontinence of urine?  Patient denies   Any bowel dysfunction?   Patient denies    History of heavy alcohol intake?  Only weekends, 1 can of beer at most History of heavy tobacco use?  Patient denies   Family history of dementia?  Her mother had "volatile AD " Patient lives with husband    Past Medical History:  Diagnosis Date   Atrial fibrillation    a. admx with AFib with RVR and acute systolic CHF 07/2013; TEE with LAA clot and no DCCV done;     Bilateral bunions 12/27/2020   Capsulitis of metatarsophalangeal (MTP) joint of right foot 01/31/2021   Chronic systolic heart failure    Coronary artery disease    a.  LHC (07/17/13):  LM 20%, ostial CFX 20-30%   Degenerative disc disease, lumbar 10/30/2017   Degenerative scoliosis 10/30/2017   Essential hypertension 09/10/2020   Fatigue 04/13/2014   Generalized anxiety disorder 09/22/2019   History of asthma 07/21/2013   History of cervical  dysplasia 12/22/2021   History of kidney stones 2013   History of lumbar spinal fusion 01/16/2019   Hypercholesterolemia 09/22/2019   Hyperlipemia    Hypothyroidism    past hx   Leg weakness, bilateral 11/28/2017   Long term (current) use of anticoagulants 10/30/2017   Loss of bladder control 11/28/2017   Lumbar radiculopathy 11/28/2017   Major depressive disorder 09/22/2019   Moderate mitral regurgitation 07/17/2013   NICM (nonischemic cardiomyopathy)    a. Echo (07/17/13):  EF 25-30%, EF subsequently normalized with sinus rhythm (tachycardia mediated CM)   Osteopenia    Overweight 07/18/2019   Sinus bradycardia 08/28/2013   Spondylolisthesis at L4-L5 level 10/30/2017     Past Surgical History:  Procedure Laterality Date   ABDOMINAL HYSTERECTOMY  ~ 2001   ABLATION  10/28/13   PVI by Dr Johney Frame   APPENDECTOMY  1978   ATRIAL FIBRILLATION ABLATION N/A 10/28/2013   Procedure: ATRIAL FIBRILLATION ABLATION;  Surgeon: Gardiner Rhyme, MD;  Location: [facility] CATH LAB;  Service: Cardiovascular;  Laterality: N/A;   ATRIAL FIBRILLATION ABLATION N/A 11/23/2020   Procedure: ATRIAL FIBRILLATION ABLATION;  Surgeon: Hillis Range, MD;  Location: [facility] INVASIVE CV LAB;  Service: Cardiovascular;  Laterality: N/A;   BACK SURGERY Bilateral 2019   Fusion   CARDIAC CATHETERIZATION  07/2013   CARDIOVERSION N/A 08/18/2013   Procedure: CARDIOVERSION;  Surgeon: Donato Schultz, MD;  Location: [facility] ENDOSCOPY;  Service: Cardiovascular;  Laterality: N/A;   CARDIOVERSION N/A 08/29/2013   Procedure: CARDIOVERSION;  Surgeon: Pricilla Riffle, MD;  Location: [facility] OR;  Service: Cardiovascular;  Laterality: N/A;   ELECTROPHYSIOLOGIC STUDY N/A 11/05/2014   Procedure: Atrial Fibrillation Ablation;  Surgeon: Hillis Range, MD;  Location: [facility] INVASIVE CV LAB;  Service: Cardiovascular;  Laterality: N/A;   EYE SURGERY Bilateral 1999   Corrective laser surgery   implantable loop recorder removal  10/02/2018   MDT Reveal LINQ removed in office by  Dr Johney Frame for EOL battery   IR SACROPLASTY BILATERAL  02/06/2019   LEFT HEART CATHETERIZATION WITH CORONARY ANGIOGRAM N/A 07/17/2013   Procedure: LEFT HEART CATHETERIZATION WITH CORONARY ANGIOGRAM;  Surgeon: Micheline Chapman, MD;  Location: [facility] CATH LAB;  Service: Cardiovascular;  Laterality: N/A;   LOOP RECORDER IMPLANT N/A 06/05/2014   Procedure: LOOP RECORDER IMPLANT;  Surgeon: Hillis Range, MD;  Location: [facility] CATH LAB;  Service: Cardiovascular;  Laterality: N/A;   TEE WITHOUT  CARDIOVERSION N/A 07/18/2013   Procedure: TRANSESOPHAGEAL ECHOCARDIOGRAM (TEE);  Surgeon: Lewayne Bunting, MD;  Location: [facility] ENDOSCOPY;  Service: Cardiovascular;  Laterality: N/A;   TEE WITHOUT CARDIOVERSION N/A 08/18/2013   Procedure: TRANSESOPHAGEAL ECHOCARDIOGRAM (TEE);  Surgeon: Donato Schultz, MD;  Location: [facility] ENDOSCOPY;  Service: Cardiovascular;  Laterality: N/A;   TEE WITHOUT CARDIOVERSION N/A 10/27/2013   Procedure: TRANSESOPHAGEAL ECHOCARDIOGRAM (TEE);  Surgeon: Pricilla Riffle, MD;  Location: [facility] ENDOSCOPY;  Service: Cardiovascular;  Laterality: N/A;   TEE WITHOUT CARDIOVERSION N/A 11/04/2014   Procedure: TRANSESOPHAGEAL ECHOCARDIOGRAM (TEE);  Surgeon: Chrystie Nose, MD;  Location: [facility] ENDOSCOPY;  Service: Cardiovascular;  Laterality: N/A;     PREVIOUS MEDICATIONS:   CURRENT MEDICATIONS:  Outpatient Encounter Medications as of 01/10/2022  Medication Sig   acetaminophen (TYLENOL) 500 MG tablet Take 1,000-2,000 mg by mouth every 6 (six) hours as needed for mild pain or headache.   atorvastatin (LIPITOR) 40 MG tablet Take 1 tablet (40 mg total) by mouth daily.   buPROPion (WELLBUTRIN XL) 150 MG 24 hr tablet Take 150 mg by mouth daily.   cyanocobalamin 2000 MCG tablet Take 5,000 mcg by mouth daily. One daily   ELIQUIS 5 MG TABS tablet TAKE 1 TABLET BY MOUTH TWICE A DAY   flecainide (TAMBOCOR) 100 MG tablet TAKE 1 TABLET BY MOUTH TWICE A DAY   furosemide (LASIX) 20 MG tablet TAKE 1 TABLET BY MOUTH EVERY OTHER DAY    hydrOXYzine (ATARAX/VISTARIL) 25 MG tablet Take 25 mg by mouth at bedtime.   KLOR-CON M10 10 MEQ tablet TAKE 1 TABLET (10 MEQ TOTAL) BY MOUTH EVERY OTHER DAY. WHEN TAKING LASIX   lisinopril (ZESTRIL) 5 MG tablet TAKE 1 TABLET BY MOUTH EVERY DAY   metoprolol tartrate (LOPRESSOR) 25 MG tablet TAKE 0.5 TABLETS (12.5 MG TOTAL) BY MOUTH AS DIRECTED. (Patient taking differently: Take 25 mg by mouth daily as needed (Afib).)   Multiple Vitamin (MULTIVITAMIN) tablet Take 1 tablet by mouth daily. Woman's 50 +   nystatin-triamcinolone (MYCOLOG II) cream Apply 1 application topically 2 (two) times daily as needed for rash.   pramipexole (MIRAPEX) 0.25 MG tablet Take 0.25-0.5 mg by mouth at bedtime.   sertraline (ZOLOFT) 100 MG tablet Take 200 mg by mouth every morning.    Vitamin D3 (VITAMIN D) 25 MCG tablet Take 1,000 Units by mouth daily.   Facility-Administered Encounter Medications as of 01/10/2022  Medication   0.9 %  sodium chloride infusion   0.9 %  sodium chloride infusion     Objective:     PHYSICAL EXAMINATION:    VITALS:  There were no vitals filed for this visit.  GEN:  The patient appears stated age and is in NAD. HEENT:  Normocephalic, atraumatic.   Neurological examination:  General: NAD, well-groomed, appears stated age. Orientation: The patient is alert. Oriented to person, place and date Cranial nerves: There is good facial symmetry.The speech is fluent and clear. No aphasia or dysarthria. Fund of knowledge is appropriate. Recent memory impaired and remote memory is normal.  Attention and concentration are normal.  Able to name objects and repeat phrases.  Hearing is intact to conversational tone.    Sensation: Sensation is intact to light touch throughout Motor: Strength is at least antigravity x4. Tremors: none  DTR's 2/4 in UE/LE      11/28/2021   10:00 AM  Montreal Cognitive Assessment   Visuospatial/ Executive (0/5) 4  Naming (0/3) 3  Attention: Read list of  digits (0/2) 2  Attention:  Read list of letters (0/1) 1  Attention: Serial 7 subtraction starting at 100 (0/3) 3  Language: Repeat phrase (0/2) 2  Language : Fluency (0/1) 0  Abstraction (0/2) 1  Delayed Recall (0/5) 3  Orientation (0/6) 6  Total 25  Adjusted Score (based on education) 25        No data to display              Movement examination: Tone: There is normal tone in the UE/LE Abnormal movements:  no tremor.  No myoclonus.  No asterixis.   Coordination:  There is no decremation with RAM's. Normal finger to nose  Gait and Station: The patient has no difficulty arising out of a deep-seated chair without the use of the hands. The patient's stride length is good.  Gait is cautious and narrow.   Thank you for allowing [place] the opportunity to participate in the care of this nice patient. Please do not hesitate to contact us for any questions or concerns.   Total time spent on today's visit was *** minutes dedicated to this patient today, preparing to see patient, examining the patient, ordering tests and/or medications and counseling the patient, documenting clinical information in the EHR or other health record, independently interpreting results and communicating results to the patient/family, discussing treatment and goals, answering patient's questions and coordinating care.  Cc:  [name], MD  Marlowe Kays 01/10/2022 12:47 PM

## 2022-01-14 ENCOUNTER — Other Ambulatory Visit: Payer: Self-pay | Admitting: Internal Medicine

## 2022-01-14 DIAGNOSIS — Z820 Family history of epilepsy and other diseases of the nervous system: Secondary | ICD-10-CM

## 2022-01-14 NOTE — Procedures (Signed)
    Patient Name: Kiara [name], Kiara [name] Date: 01/02/2022 Gender: Female D.O.B: 1960-04-16 Age (years): 61 Referring Provider: Marcos Eke PA-C Height (inches): 66 Interpreting Physician: Jetty Duhamel MD, ABSM Weight (lbs): 204 RPSGT: Rosette Reveal BMI: 33 MRN: [id number] Neck Size: 15.00  CLINICAL INFORMATION Sleep Study Type: NPSG Indication for sleep study: Depression, Hypertension Epworth Sleepiness Score: 12  SLEEP STUDY TECHNIQUE As per the AASM Manual for the Scoring of Sleep and Associated Events v2.3 (April 2016) with a hypopnea requiring 4% desaturations.  The channels recorded and monitored were frontal, central and occipital EEG, electrooculogram (EOG), submentalis EMG (chin), nasal and oral airflow, thoracic and abdominal wall motion, anterior tibialis EMG, snore microphone, electrocardiogram, and pulse oximetry.  MEDICATIONS Medications self-administered by patient taken the night of the study : none reported  SLEEP ARCHITECTURE The study was initiated at 10:09:04 PM and ended at 4:33:54 AM.  Sleep onset time was 143.3 minutes and the sleep efficiency was 26.5%%. The total sleep time was 102 minutes.  Stage REM latency was 115.0 minutes.  The patient spent 14.7%% of the night in stage N1 sleep, 82.8%% in stage N2 sleep, 0.0%% in stage N3 and 2.5% in REM.  Alpha intrusion was absent.  Supine sleep was 50.00%.  RESPIRATORY PARAMETERS The overall apnea/hypopnea index (AHI) was 2.9 per hour. There were 0 total apneas, including 0 obstructive, 0 central and 0 mixed apneas. There were 5 hypopneas and 5 RERAs.  The AHI during Stage REM sleep was 24.0 per hour.  AHI while supine was 2.4 per hour.  The mean oxygen saturation was 90.3%. The minimum SpO2 during sleep was 87.0%.  snoring was noted during this study.  CARDIAC DATA The 2 lead EKG demonstrated sinus rhythm. The mean heart rate was 57.0 beats per minute. Other EKG findings include: None.  LEG  MOVEMENT DATA The total PLMS were 0 with a resulting PLMS index of 0.0. Associated arousal with leg movement index was 0.0 .  IMPRESSIONS - No significant obstructive sleep apnea occurred during this study (AHI = 2.9/h). - Mild oxygen desaturation was noted during this study (Min O2 = 87.0%). Mean 90.3%. Time with O2 saturation 88% or less was 16.4 minutes. - No snoring was audible during this study. - Rare PVC - Clinically significant periodic limb movements did not occur during sleep. No significant associated arousals. - Limb leads were in place but no significant motor activity recorded. - Patient had significant difficulty initiating and maintaining sleep, with Total Sleep Time only 102 minutes and negligible REM. She had chosen not to take her hydroxyzine. If this is insufficient for clinical purposes, consider return for repeat study.  DIAGNOSIS - Nocturnal Hypoxemia (G47.36)  RECOMMENDATIONS - Consider evaluation for nocturnal hypoxemia. - Sleep hygiene should be reviewed to assess factors that may improve sleep quality. - Weight management and regular exercise should be initiated or continued if appropriate.  [Electronically signed] 01/14/2022 04:06 PM  Jetty Duhamel MD, ABSM Diplomate, American Board of Sleep Medicine NPI: [id number]                         Jetty Duhamel Diplomate, American Board of Sleep Medicine  ELECTRONICALLY SIGNED ON:  01/14/2022, 3:58 PM [facility] PH: [phone]   FX: [phone] ACCREDITED BY THE AMERICAN ACADEMY OF SLEEP MEDICINE

## 2022-01-23 ENCOUNTER — Ambulatory Visit (INDEPENDENT_AMBULATORY_CARE_PROVIDER_SITE_OTHER): Payer: BC Managed Care – PPO | Admitting: Psychologist

## 2022-01-23 DIAGNOSIS — F330 Major depressive disorder, recurrent, mild: Secondary | ICD-10-CM

## 2022-01-23 DIAGNOSIS — Z634 Disappearance and death of family member: Secondary | ICD-10-CM | POA: Diagnosis not present

## 2022-01-23 NOTE — Progress Notes (Signed)
                Kiowa Peifer, PsyD 
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
## 2022-01-31 DIAGNOSIS — M7061 Trochanteric bursitis, right hip: Secondary | ICD-10-CM | POA: Insufficient documentation

## 2022-01-31 DIAGNOSIS — M1611 Unilateral primary osteoarthritis, right hip: Secondary | ICD-10-CM | POA: Insufficient documentation

## 2022-02-01 ENCOUNTER — Ambulatory Visit (INDEPENDENT_AMBULATORY_CARE_PROVIDER_SITE_OTHER): Payer: BC Managed Care – PPO | Admitting: Psychologist

## 2022-02-01 DIAGNOSIS — F330 Major depressive disorder, recurrent, mild: Secondary | ICD-10-CM | POA: Diagnosis not present

## 2022-02-01 DIAGNOSIS — Z634 Disappearance and death of family member: Secondary | ICD-10-CM

## 2022-02-01 NOTE — Progress Notes (Signed)
                Eleny Cortez, PsyD 

## 2022-02-01 NOTE — Progress Notes (Signed)
[facility] Counselor/Therapist Progress Note  Patient ID: Kiara [name], MRN: [id number],    Date: 02/01/2022  Time Spent: 03:05 pm to 03:49 pm; total time: 44 minutes   This session was held via video webex teletherapy due to the coronavirus risk at this time. The patient consented to video teletherapy and was located at her home during this session. She is aware it is the responsibility of the patient to secure confidentiality on her end of the session. The provider was in a private home office for the duration of this session. Limits of confidentiality were discussed with the patient.   Treatment Type: Individual Therapy  Reported Symptoms: Grief related to death of father  Mental Status Exam: Appearance:  Well Groomed     Behavior: Appropriate  Motor: Normal  Speech/Language:  Clear and Coherent  Affect: Appropriate  Mood: normal  Thought process: normal  Thought content:   WNL  Sensory/Perceptual disturbances:   WNL  Orientation: oriented to person, place, and time/date  Attention: Good  Concentration: Good  Memory: WNL  Fund of knowledge:  Good  Insight:   Fair  Judgment:  Good  Impulse Control: Good   Risk Assessment: Danger to Self:  No Self-injurious Behavior: No Danger to Others: No Duty to Warn:no Physical Aggression / Violence:No  Access to Firearms a concern: No  Gang Involvement:No   Subjective: Beginning the session, patient described herself as okay. After reviewing the treatment plan, patient processed thoughts and emotions related to the holidays. Continuing to talk, patient participated in a life review of her father. She processed thoughts and emotions. She was agreeable to homework and following up. She denied suicidal and homicidal ideation.    Interventions:  Worked on building a therapeutic relationship with the patient using active listening and reflective statements. Provided emotional support using empathy and validation.  Reviewed the treatment plan with the patient. Reviewed events since the intake. Normalized and validated thoughts and emotions. Identified goals for the session. Processed and validated emotions related to the holidays. Assisted the patient in doing a life review of her father. Normalized expressed emotions. Used socratic questions to assist the patient. Reflected on ways to honor her father. Processed the idea of writing him a letter. Assisted in problem solving. Assigned homework. Assessed for suicidal and homicidal ideation.   Homework: Consider writing father a letter  Next Session: Review homework and emotional support  Diagnosis: F33.0 major depressive affective disorder, recurrent, mild and Z63.4 uncomplicated bereavement.   Plan:   Goals Alleviate depressive symptoms Recognize, accept, and cope with depressive feelings Develop healthy thinking patterns Develop healthy interpersonal relationships  Objectives target date for all objectives is 01/24/2023 Cooperate with a medication evaluation by a physician Verbalize an accurate understanding of depression Verbalize an understanding of the treatment Identify and replace thoughts that support depression Learn and implement behavioral strategies Verbalize an understanding and resolution of current interpersonal problems Learn and implement problem solving and decision making skills Learn and implement conflict resolution skills to resolve interpersonal problems Verbalize an understanding of healthy and unhealthy emotions verbalize insight into how past relationships may be influence current experiences with depression Use mindfulness and acceptance strategies and increase value based behavior  Increase hopeful statements about the future.  Interventions Consistent with treatment model, discuss how change in cognitive, behavioral, and interpersonal can help client alleviate depression CBT Behavioral activation help the client explore  the relationship, nature of the dispute,  Help the client develop new interpersonal skills and relationships  Conduct Problem so living therapy Teach conflict resolution skills Use a process-experiential approach Conduct TLDP Conduct ACT Evaluate need for psychotropic medication Monitor adherence to medication   Goals Begin a healthy grieving process Objectives target date for all objectives is 01/24/2023 Tell in detail the story of the current loss that is triggering symptoms Read books on the topic of grief Watch videos on the theme of grief Begin verbalizing feelings associated with the loss Attend a grief support group express thoughts and feelings about the deceased Identify and voice positives about the deceased implement acts of spiritual faith  Interventions create a safe environment and actively build trust use empathy, compassion, and support ask the patient to write a letter to the lost person conduct empty chair ask the patient to discuss and list the positives and negative aspects of the person encourage patient to rely upon his/her spiritual faith  ask client to read books on grief ask patient to watch videos about grief assist patient in identifying emotions  ask patient to attend support group   The patient and clinician reviewed the treatment plan on 02/01/2022. The patient approved of the treatment plan.    Hilbert Corrigan, PsyD

## 2022-02-03 ENCOUNTER — Telehealth (INDEPENDENT_AMBULATORY_CARE_PROVIDER_SITE_OTHER): Payer: BC Managed Care – PPO | Admitting: Physician Assistant

## 2022-02-03 DIAGNOSIS — R413 Other amnesia: Secondary | ICD-10-CM | POA: Diagnosis not present

## 2022-02-03 NOTE — Progress Notes (Signed)
Virtual Visit via Video Note The purpose of this virtual visit is to provide medical care in a patient that is unable to be seen in person due to physical or health limitations   Consent was obtained for video visit:  yes  Answered questions that patient had about telehealth interaction:  yes I discussed the limitations, risks, security and privacy concerns of performing an evaluation and management service by telemedicine. I also discussed with the patient that there may be a patient responsible charge related to this service. The patient expressed understanding and agreed to proceed.  Pt location: Home Physician Location: office Name of referring provider:  [name], MD I connected with Sharlot Gowda at patients initiation/request on 02/03/2022 at  9:00 AM EST by video enabled telemedicine application and verified that I am speaking with the correct person using two identifiers. Pt MRN:  [id number] Pt DOB:  02/05/1961 Video Participants:  Sharlot Gowda;  ***    Assessment and Plan:   @PATIENTNAME @ is a 61 y.o. RH female with a history of   seen today in follow up.  Repeat neurocognitive testing in 24 to 36 months for clarity of the diagnosis and disease progression Continue to follow-up with psychiatry Follow-up as needed History of Present Illness: ***    Neuropsychological evaluation 12/29/2021 briefly, results suggested performance variability but overall weakness across executive functioning. Performances were appropriate relative to age-matched peers across all other assessed cognitive domains. Currently, the most likely etiology for reported day-to-day dysfunction surrounds a combination of psychiatric and medical variables. Psychiatrically, Ms. Straker reported severe levels of anxiety and depression occurring during the past 1-2 weeks. She reported the acute passing of her father two weeks prior to the current evaluation, as well as the passing of her mother about a  year prior as primary sources of distress. Additionally, she has numerous cardiovascular and other medical ailments (e.g., atrial fibrillation, coronary artery disease, hypertension, cardiomyopathy, hypercholesteremia) which can negatively impact cognitive performances. She also reported ongoing sleep dysfunction. Her weakness in executive functioning across the current evaluation can certainly be attributed to the presence and severity of these symptoms.       Current Outpatient Medications on File Prior to Visit  Medication Sig Dispense Refill   acetaminophen (TYLENOL) 500 MG tablet Take 1,000-2,000 mg by mouth every 6 (six) hours as needed for mild pain or headache.     atorvastatin (LIPITOR) 40 MG tablet Take 1 tablet (40 mg total) by mouth daily. 90 tablet 3   buPROPion (WELLBUTRIN XL) 150 MG 24 hr tablet Take 150 mg by mouth daily.     cyanocobalamin 2000 MCG tablet Take 5,000 mcg by mouth daily. One daily     ELIQUIS 5 MG TABS tablet TAKE 1 TABLET BY MOUTH TWICE A DAY 180 tablet 1   flecainide (TAMBOCOR) 100 MG tablet TAKE 1 TABLET BY MOUTH TWICE A DAY 180 tablet 2   furosemide (LASIX) 20 MG tablet TAKE 1 TABLET BY MOUTH EVERY OTHER DAY 45 tablet 3   hydrOXYzine (ATARAX/VISTARIL) 25 MG tablet Take 25 mg by mouth at bedtime.     KLOR-CON M10 10 MEQ tablet TAKE 1 TABLET (10 MEQ TOTAL) BY MOUTH EVERY OTHER DAY. WHEN TAKING LASIX 45 tablet 2   lisinopril (ZESTRIL) 5 MG tablet TAKE 1 TABLET BY MOUTH EVERY DAY 90 tablet 0   metoprolol tartrate (LOPRESSOR) 25 MG tablet TAKE 0.5 TABLETS (12.5 MG TOTAL) BY MOUTH AS DIRECTED. (Patient taking differently: Take 25 mg by  mouth daily as needed (Afib).) 45 tablet 3   Multiple Vitamin (MULTIVITAMIN) tablet Take 1 tablet by mouth daily. Woman's 50 +     nystatin-triamcinolone (MYCOLOG II) cream Apply 1 application topically 2 (two) times daily as needed for rash.     pramipexole (MIRAPEX) 0.25 MG tablet Take 0.25-0.5 mg by mouth at bedtime.     sertraline  (ZOLOFT) 100 MG tablet Take 200 mg by mouth every morning.      Vitamin D3 (VITAMIN D) 25 MCG tablet Take 1,000 Units by mouth daily.     Current Facility-Administered Medications on File Prior to Visit  Medication Dose Route Frequency Provider Last Rate Last Admin   0.9 %  sodium chloride infusion   Intravenous Continuous Monia Sabal, PA-C       0.9 %  sodium chloride infusion   Intravenous Continuous Monia Sabal, PA-C         Observations/Objective:   There were no vitals filed for this visit. GEN:  The patient appears stated age and is in NAD.  Neurological examination: Patient is awake, alert, oriented x 3. No aphasia or dysarthria. Intact fluency and comprehension. Remote and recent memory intact. Able to name and repeat. Cranial nerves: Extraocular movements intact with no nystagmus. No facial asymmetry. Motor: moves all extremities symmetrically, at least anti-gravity x 4. No incoordination on finger to nose testing. Gait: narrow-based and steady, able to tandem walk adequately. Negative Romberg test.     Follow Up Instructions:    -I discussed the assessment and treatment plan with the patient. The patient was provided an opportunity to ask questions and all were answered. The patient agreed with the plan and demonstrated an understanding of the instructions.   The patient was advised to call back or seek an in-person evaluation if the symptoms worsen or if the condition fails to improve as anticipated.    Total time spent on today's visit was ***minutes, including both face-to-face time and nonface-to-face time.  Time included that spent on review of records (prior notes available to me/labs/imaging if pertinent), discussing treatment and goals, answering patient's questions and coordinating care.   Sharene Butters, PA-C

## 2022-03-03 ENCOUNTER — Ambulatory Visit: Payer: BC Managed Care – PPO | Admitting: Psychologist

## 2022-03-15 ENCOUNTER — Ambulatory Visit (INDEPENDENT_AMBULATORY_CARE_PROVIDER_SITE_OTHER): Payer: 59 | Admitting: Psychologist

## 2022-03-15 DIAGNOSIS — F330 Major depressive disorder, recurrent, mild: Secondary | ICD-10-CM | POA: Diagnosis not present

## 2022-03-15 DIAGNOSIS — Z634 Disappearance and death of family member: Secondary | ICD-10-CM | POA: Diagnosis not present

## 2022-03-15 NOTE — Progress Notes (Signed)
[facility] Counselor/Therapist Progress Note  Patient ID: Kiara [name], MRN: [id number],    Date: 03/15/2022  Time Spent: 01:04 pm to 01:50 pm; total time: 46 minutes   This session was held via video webex teletherapy due to the coronavirus risk at this time. The patient consented to video teletherapy and was located at her home during this session. She is aware it is the responsibility of the patient to secure confidentiality on her end of the session. The provider was in a private home office for the duration of this session. Limits of confidentiality were discussed with the patient.   Treatment Type: Individual Therapy  Reported Symptoms: Grief  Mental Status Exam: Appearance:  Well Groomed     Behavior: Appropriate  Motor: Normal  Speech/Language:  Clear and Coherent  Affect: Appropriate  Mood: normal  Thought process: normal  Thought content:   WNL  Sensory/Perceptual disturbances:   WNL  Orientation: oriented to person, place, and time/date  Attention: Good  Concentration: Good  Memory: WNL  Fund of knowledge:  Good  Insight:   Fair  Judgment:  Good  Impulse Control: Good   Risk Assessment: Danger to Self:  No Self-injurious Behavior: No Danger to Others: No Duty to Warn:no Physical Aggression / Violence:No  Access to Firearms a concern: No  Gang Involvement:No   Subjective: Beginning the session, patient described herself as okay while reflecting on the holidays. From there, she reflected on the letter she wrote regarding her father dying and processed it. Continuing to talk, she reflected on the idea of opening up towards others in her small group regarding the emotions she experiences. She was agreeable to homework and following up. She denied suicidal and homicidal ideation.    Interventions:  Worked on building a therapeutic relationship with the patient using active listening and reflective statements. Provided emotional support using empathy  and validation. Reviewed the treatment plan with the patient. Reviewed events since the holidays. Praised the patient for doing well and explored what has assisted the patient. Praised the patient for completing the assignment. Reflected and processed the assignment. Used socratic questions to assist the patient. Processed the idea of opening up to the small group that she has participated in for 12 years. Assisted in problem solving. Assigned homework. Assessed for suicidal and homicidal ideation.   Homework: Consider opening up to small group about grief  Next Session: Review homework and emotional support  Diagnosis: F33.0 major depressive affective disorder, recurrent, mild and U23.5 uncomplicated bereavement.   Plan:   Goals Alleviate depressive symptoms Recognize, accept, and cope with depressive feelings Develop healthy thinking patterns Develop healthy interpersonal relationships  Objectives target date for all objectives is 01/24/2023 Cooperate with a medication evaluation by a physician Verbalize an accurate understanding of depression Verbalize an understanding of the treatment Identify and replace thoughts that support depression Learn and implement behavioral strategies Verbalize an understanding and resolution of current interpersonal problems Learn and implement problem solving and decision making skills Learn and implement conflict resolution skills to resolve interpersonal problems Verbalize an understanding of healthy and unhealthy emotions verbalize insight into how past relationships may be influence current experiences with depression Use mindfulness and acceptance strategies and increase value based behavior  Increase hopeful statements about the future.  Interventions Consistent with treatment model, discuss how change in cognitive, behavioral, and interpersonal can help client alleviate depression CBT Behavioral activation help the client explore the  relationship, nature of the dispute,  Help the client develop  new interpersonal skills and relationships Conduct Problem so living therapy Teach conflict resolution skills Use a process-experiential approach Conduct TLDP Conduct ACT Evaluate need for psychotropic medication Monitor adherence to medication   Goals Begin a healthy grieving process Objectives target date for all objectives is 01/24/2023 Tell in detail the story of the current loss that is triggering symptoms Read books on the topic of grief Watch videos on the theme of grief Begin verbalizing feelings associated with the loss Attend a grief support group express thoughts and feelings about the deceased Identify and voice positives about the deceased implement acts of spiritual faith  Interventions create a safe environment and actively build trust use empathy, compassion, and support ask the patient to write a letter to the lost person conduct empty chair ask the patient to discuss and list the positives and negative aspects of the person encourage patient to rely upon his/her spiritual faith  ask client to read books on grief ask patient to watch videos about grief assist patient in identifying emotions  ask patient to attend support group   The patient and clinician reviewed the treatment plan on 02/01/2022. The patient approved of the treatment plan.    Conception Chancy, PsyD

## 2022-03-23 ENCOUNTER — Encounter (HOSPITAL_COMMUNITY): Payer: Self-pay | Admitting: *Deleted

## 2022-03-29 ENCOUNTER — Ambulatory Visit (INDEPENDENT_AMBULATORY_CARE_PROVIDER_SITE_OTHER): Payer: 59 | Admitting: Psychologist

## 2022-03-29 DIAGNOSIS — Z634 Disappearance and death of family member: Secondary | ICD-10-CM

## 2022-03-29 DIAGNOSIS — F330 Major depressive disorder, recurrent, mild: Secondary | ICD-10-CM

## 2022-03-29 NOTE — Progress Notes (Signed)
[facility] Counselor/Therapist Progress Note  Patient ID: Kiara [name], MRN: [id number],    Date: 03/29/2022  Time Spent: 12:02 pm to 12:43 pm; total time: 41 minutes   This session was held via video webex teletherapy due to the coronavirus risk at this time. The patient consented to video teletherapy and was located at her home during this session. She is aware it is the responsibility of the patient to secure confidentiality on her end of the session. The provider was in a private home office for the duration of this session. Limits of confidentiality were discussed with the patient.   Treatment Type: Individual Therapy  Reported Symptoms: Vulnerability challenges  Mental Status Exam: Appearance:  Well Groomed     Behavior: Appropriate  Motor: Normal  Speech/Language:  Clear and Coherent  Affect: Appropriate  Mood: normal  Thought process: normal  Thought content:   WNL  Sensory/Perceptual disturbances:   WNL  Orientation: oriented to person, place, and time/date  Attention: Good  Concentration: Good  Memory: WNL  Fund of knowledge:  Good  Insight:   Fair  Judgment:  Good  Impulse Control: Good   Risk Assessment: Danger to Self:  No Self-injurious Behavior: No Danger to Others: No Duty to Warn:no Physical Aggression / Violence:No  Access to Firearms a concern: No  Gang Involvement:No   Subjective: Beginning the session, patient described herself as well while reviewing events since the last session. Patient disclosed that she was vulnerable with those around her and was surprised by how receptive they were to her vulnerability. Patient spent the session reflecting on vulnerability. She was agreeable to following up. She denied suicidal and homicidal ideation.    Interventions:  Worked on building a therapeutic relationship with the patient using active listening and reflective statements. Provided emotional support using empathy and validation. Reviewed  the treatment plan with the patient. Used summary and reflective statements. Praised the patient for completing assignment and being vulnerable with those around her. Processed what that experience was like. Identified the theme of vulnerability. Provided education about vulnerability. Processed thoughts and emotions. Provided empathic statements Assessed for suicidal and homicidal ideation.   Homework: NA  Next Session: Process letting go of the past  Diagnosis: F33.0 major depressive affective disorder, recurrent, mild and 123XX123 uncomplicated bereavement.   Plan:   Goals Alleviate depressive symptoms Recognize, accept, and cope with depressive feelings Develop healthy thinking patterns Develop healthy interpersonal relationships  Objectives target date for all objectives is 01/24/2023 Cooperate with a medication evaluation by a physician Verbalize an accurate understanding of depression Verbalize an understanding of the treatment Identify and replace thoughts that support depression Learn and implement behavioral strategies Verbalize an understanding and resolution of current interpersonal problems Learn and implement problem solving and decision making skills Learn and implement conflict resolution skills to resolve interpersonal problems Verbalize an understanding of healthy and unhealthy emotions verbalize insight into how past relationships may be influence current experiences with depression Use mindfulness and acceptance strategies and increase value based behavior  Increase hopeful statements about the future.  Interventions Consistent with treatment model, discuss how change in cognitive, behavioral, and interpersonal can help client alleviate depression CBT Behavioral activation help the client explore the relationship, nature of the dispute,  Help the client develop new interpersonal skills and relationships Conduct Problem so living therapy Teach conflict resolution  skills Use a process-experiential approach Conduct TLDP Conduct ACT Evaluate need for psychotropic medication Monitor adherence to medication   Goals Begin a  healthy grieving process Objectives target date for all objectives is 01/24/2023 Tell in detail the story of the current loss that is triggering symptoms Read books on the topic of grief Watch videos on the theme of grief Begin verbalizing feelings associated with the loss Attend a grief support group express thoughts and feelings about the deceased Identify and voice positives about the deceased implement acts of spiritual faith  Interventions create a safe environment and actively build trust use empathy, compassion, and support ask the patient to write a letter to the lost person conduct empty chair ask the patient to discuss and list the positives and negative aspects of the person encourage patient to rely upon his/her spiritual faith  ask client to read books on grief ask patient to watch videos about grief assist patient in identifying emotions  ask patient to attend support group   The patient and clinician reviewed the treatment plan on 02/01/2022. The patient approved of the treatment plan.    Conception Chancy, PsyD

## 2022-04-03 ENCOUNTER — Ambulatory Visit: Payer: 59 | Admitting: Psychologist

## 2022-04-03 ENCOUNTER — Encounter: Payer: Self-pay | Admitting: Physician Assistant

## 2022-04-17 ENCOUNTER — Ambulatory Visit (INDEPENDENT_AMBULATORY_CARE_PROVIDER_SITE_OTHER): Payer: 59 | Admitting: Psychologist

## 2022-04-17 DIAGNOSIS — Z634 Disappearance and death of family member: Secondary | ICD-10-CM | POA: Diagnosis not present

## 2022-04-17 DIAGNOSIS — F330 Major depressive disorder, recurrent, mild: Secondary | ICD-10-CM | POA: Diagnosis not present

## 2022-04-17 NOTE — Progress Notes (Signed)
[facility] Counselor/Therapist Progress Note  Patient ID: FEIGE KRUTZ, MRN: [id number],    Date: 04/17/2022  Time Spent: 02:08 pm to 02:46 pm; total time: 38 minutes   This session was held via video webex teletherapy due to the coronavirus risk at this time. The patient consented to video teletherapy and was located at her home during this session. She is aware it is the responsibility of the patient to secure confidentiality on her end of the session. The provider was in a private home office for the duration of this session. Limits of confidentiality were discussed with the patient.   Treatment Type: Individual Therapy  Reported Symptoms: Concern about how to support daughter  Mental Status Exam: Appearance:  Well Groomed     Behavior: Appropriate  Motor: Normal  Speech/Language:  Clear and Coherent  Affect: Appropriate  Mood: normal  Thought process: normal  Thought content:   WNL  Sensory/Perceptual disturbances:   WNL  Orientation: oriented to person, place, and time/date  Attention: Good  Concentration: Good  Memory: WNL  Fund of knowledge:  Good  Insight:   Fair  Judgment:  Good  Impulse Control: Good   Risk Assessment: Danger to Self:  No Self-injurious Behavior: No Danger to Others: No Duty to Warn:no Physical Aggression / Violence:No  Access to Firearms a concern: No  Gang Involvement:No   Subjective: Beginning the session, patient described herself as doing okay indicating that several events have occurred since the last session. She primarily talked about her daughter who is moving back to [place]. Her daughter has had some health challenges as well as other challenges. Patient reflected on how she is trying to be present for her daughter. She stated understanding regarding the transition. She was agreeable to following up. She denied suicidal and homicidal ideation.    Interventions:  Worked on building a therapeutic relationship with the patient  using active listening and reflective statements. Provided emotional support using empathy and validation. Reviewed the treatment plan with the patient. Reviewed events since the last session. Normalized and validated thoughts and emotions. Identified goals for the session. Reviewed the dynamic that patient maintains with daughter. Validated expressed concerns related to daughter. Used socratic questions to assist the patient. Challenged some of the thoughts expressed. Explored the idea of still communicating with parents even if they do not respond. Processed thoughts and emotions. Disclosed to patient about the upcoming transition. Discussed next steps. Provided empathic statements Assessed for suicidal and homicidal ideation.   Homework: NA  Next Session: Process how to support daughter. Discuss transition.   Diagnosis: F33.0 major depressive affective disorder, recurrent, mild and 123XX123 uncomplicated bereavement.   Plan:   Goals Alleviate depressive symptoms Recognize, accept, and cope with depressive feelings Develop healthy thinking patterns Develop healthy interpersonal relationships  Objectives target date for all objectives is 01/24/2023 Cooperate with a medication evaluation by a physician Verbalize an accurate understanding of depression Verbalize an understanding of the treatment Identify and replace thoughts that support depression Learn and implement behavioral strategies Verbalize an understanding and resolution of current interpersonal problems Learn and implement problem solving and decision making skills Learn and implement conflict resolution skills to resolve interpersonal problems Verbalize an understanding of healthy and unhealthy emotions verbalize insight into how past relationships may be influence current experiences with depression Use mindfulness and acceptance strategies and increase value based behavior  Increase hopeful statements about the future.   Interventions Consistent with treatment model, discuss how change in cognitive, behavioral, and  interpersonal can help client alleviate depression CBT Behavioral activation help the client explore the relationship, nature of the dispute,  Help the client develop new interpersonal skills and relationships Conduct Problem so living therapy Teach conflict resolution skills Use a process-experiential approach Conduct TLDP Conduct ACT Evaluate need for psychotropic medication Monitor adherence to medication   Goals Begin a healthy grieving process Objectives target date for all objectives is 01/24/2023 Tell in detail the story of the current loss that is triggering symptoms Read books on the topic of grief Watch videos on the theme of grief Begin verbalizing feelings associated with the loss Attend a grief support group express thoughts and feelings about the deceased Identify and voice positives about the deceased implement acts of spiritual faith  Interventions create a safe environment and actively build trust use empathy, compassion, and support ask the patient to write a letter to the lost person conduct empty chair ask the patient to discuss and list the positives and negative aspects of the person encourage patient to rely upon his/her spiritual faith  ask client to read books on grief ask patient to watch videos about grief assist patient in identifying emotions  ask patient to attend support group   The patient and clinician reviewed the treatment plan on 02/01/2022. The patient approved of the treatment plan.    Conception Chancy, PsyD

## 2022-04-24 ENCOUNTER — Ambulatory Visit (INDEPENDENT_AMBULATORY_CARE_PROVIDER_SITE_OTHER): Payer: 59 | Admitting: Psychologist

## 2022-04-24 DIAGNOSIS — F330 Major depressive disorder, recurrent, mild: Secondary | ICD-10-CM | POA: Diagnosis not present

## 2022-04-24 DIAGNOSIS — Z634 Disappearance and death of family member: Secondary | ICD-10-CM | POA: Diagnosis not present

## 2022-04-24 NOTE — Progress Notes (Signed)
[facility] Counselor/Therapist Progress Note  Patient ID: Kiara [name], MRN: [id number],    Date: 04/24/2022  Time Spent: 01:05 pm to 01:45 pm; total time: 40 minutes   This session was held via video webex teletherapy due to the coronavirus risk at this time. The patient consented to video teletherapy and was located at her home during this session. She is aware it is the responsibility of the patient to secure confidentiality on her end of the session. The provider was in a private home office for the duration of this session. Limits of confidentiality were discussed with the patient.   Treatment Type: Individual Therapy  Reported Symptoms: Sadness and grief  Mental Status Exam: Appearance:  Well Groomed     Behavior: Appropriate  Motor: Normal  Speech/Language:  Clear and Coherent  Affect: Appropriate  Mood: normal  Thought process: normal  Thought content:   WNL  Sensory/Perceptual disturbances:   WNL  Orientation: oriented to person, place, and time/date  Attention: Good  Concentration: Good  Memory: WNL  Fund of knowledge:  Good  Insight:   Fair  Judgment:  Good  Impulse Control: Good   Risk Assessment: Danger to Self:  No Self-injurious Behavior: No Danger to Others: No Duty to Warn:no Physical Aggression / Violence:No  Access to Firearms a concern: No  Gang Involvement:No   Subjective: Beginning the session, patient described herself as feeling sad as she found out that two individuals who her father was close to in the care facility have died recently. Patient was also close to these individuals. Patient spent the session reflecting on the relationship she had with these individuals. She also processed stress related to moving her daughter from [place] to [place]. She processed thoughts and emotions. She was agreeable to following up. She denied suicidal and homicidal ideation.    Interventions:  Worked on building a therapeutic relationship  with the patient using active listening and reflective statements. Provided emotional support using empathy and validation. Reviewed the treatment plan with the patient. Processed the events that have occurred recently. Normalized expressed emotions related to the death of both individuals. Assisted the patient in doing a brief life review of these individuals. Used socratic questions to assist the patient. Explored concerns related to moving daughter. Processed ways to implement self-care while in [place]. Validated all of the different emotions that the patient was experiencing. Reflected on who is supporting the patient. Discussed next steps. Provided empathic statements. Assessed for suicidal and homicidal ideation.   Homework: Implement self-care in [place]  Next Session: Review homework. Discuss transition. Emotional support  Diagnosis: F33.0 major depressive affective disorder, recurrent, mild and 123XX123 uncomplicated bereavement.   Plan:   Goals Alleviate depressive symptoms Recognize, accept, and cope with depressive feelings Develop healthy thinking patterns Develop healthy interpersonal relationships  Objectives target date for all objectives is 01/24/2023 Cooperate with a medication evaluation by a physician Verbalize an accurate understanding of depression Verbalize an understanding of the treatment Identify and replace thoughts that support depression Learn and implement behavioral strategies Verbalize an understanding and resolution of current interpersonal problems Learn and implement problem solving and decision making skills Learn and implement conflict resolution skills to resolve interpersonal problems Verbalize an understanding of healthy and unhealthy emotions verbalize insight into how past relationships may be influence current experiences with depression Use mindfulness and acceptance strategies and increase value based behavior  Increase hopeful statements about the future.   Interventions Consistent with treatment model, discuss how change in  cognitive, behavioral, and interpersonal can help client alleviate depression CBT Behavioral activation help the client explore the relationship, nature of the dispute,  Help the client develop new interpersonal skills and relationships Conduct Problem so living therapy Teach conflict resolution skills Use a process-experiential approach Conduct TLDP Conduct ACT Evaluate need for psychotropic medication Monitor adherence to medication   Goals Begin a healthy grieving process Objectives target date for all objectives is 01/24/2023 Tell in detail the story of the current loss that is triggering symptoms Read books on the topic of grief Watch videos on the theme of grief Begin verbalizing feelings associated with the loss Attend a grief support group express thoughts and feelings about the deceased Identify and voice positives about the deceased implement acts of spiritual faith  Interventions create a safe environment and actively build trust use empathy, compassion, and support ask the patient to write a letter to the lost person conduct empty chair ask the patient to discuss and list the positives and negative aspects of the person encourage patient to rely upon his/her spiritual faith  ask client to read books on grief ask patient to watch videos about grief assist patient in identifying emotions  ask patient to attend support group   The patient and clinician reviewed the treatment plan on 02/01/2022. The patient approved of the treatment plan.    Conception Chancy, PsyD

## 2022-05-04 ENCOUNTER — Ambulatory Visit: Payer: Self-pay | Admitting: Physician Assistant

## 2022-05-05 ENCOUNTER — Encounter: Payer: Self-pay | Admitting: Student

## 2022-05-05 ENCOUNTER — Ambulatory Visit: Payer: 59 | Attending: Student | Admitting: Student

## 2022-05-05 VITALS — BP 118/72 | HR 61 | Ht 66.0 in | Wt 213.0 lb

## 2022-05-05 DIAGNOSIS — I43 Cardiomyopathy in diseases classified elsewhere: Secondary | ICD-10-CM | POA: Diagnosis not present

## 2022-05-05 DIAGNOSIS — R000 Tachycardia, unspecified: Secondary | ICD-10-CM | POA: Diagnosis not present

## 2022-05-05 DIAGNOSIS — I480 Paroxysmal atrial fibrillation: Secondary | ICD-10-CM | POA: Diagnosis not present

## 2022-05-05 MED ORDER — METOPROLOL SUCCINATE ER 25 MG PO TB24: 12.5000 mg | ORAL_TABLET | Freq: Every day | ORAL | 3 refills | Status: DC

## 2022-05-05 MED ORDER — FLECAINIDE ACETATE 100 MG PO TABS: 100.0000 mg | ORAL_TABLET | Freq: Two times a day (BID) | ORAL | 3 refills | Status: DC

## 2022-05-05 NOTE — Patient Instructions (Signed)
Medication Instructions:  1.Start metoprolol succinate (Toprol XL) 12.5 mg daily at bedtime. (This will be one-half of a 25 mg tablet) *If you need a refill on your cardiac medications before your next appointment, please call your pharmacy*  Lab Work: BMET, CBC today If you have labs (blood work) drawn today and your tests are completely normal, you will receive your results only by: [facility] (if you have MyChart) OR A paper copy in the mail If you have any lab test that is abnormal or we need to change your treatment, we will call you to review the results.  Follow-Up: At [facility], you and your health needs are our priority.  As part of our continuing mission to provide you with exceptional heart care, we have created designated Provider Care Teams.  These Care Teams include your primary Cardiologist (physician) and Advanced Practice Providers (APPs -  Physician Assistants and Nurse Practitioners) who all work together to provide you with the care you need, when you need it.   Your next appointment:   6 month(s)  Provider:   Lars Mage, MD

## 2022-05-05 NOTE — Progress Notes (Signed)
  Electrophysiology Office Note:   Date:  05/05/2022  ID:  Akeelah, Einbinder 27-Aug-1960, MRN [id number]  Primary Cardiologist: Thompson Grayer, MD (Inactive) Electrophysiologist: Dr. Rayann Heman ->  Vickie Epley, MD   History of Present Illness:   INGRI KATA is a 62 y.o. female with h/o AF s/p ablation x 3 as below, HTN, sinus bradycardia, HLD, and prior NICM that resolved with NSR, seen today for routine electrophysiology followup. Since last being seen in our clinic the patient reports doing very well overall  from a cardiac perspective.  she denies chest pain, palpitations, dyspnea, PND, orthopnea, nausea, vomiting, dizziness, syncope, edema, weight gain, or early satiety.   Review of systems complete and found to be negative unless listed in HPI.   AF history Ablation 10/2013 Ablation 10/2014 Ablation 11/2020 QT prolongation on Tikosyn -> not candidate Flecainide -> on going.  Studies Reviewed:    EKG is ordered today. Personal review shows NSR  at 61 bpm with stable intervals  Risk Assessment/Calculations:         Physical Exam:   VS:  BP 118/72   Pulse 61   Ht 5\' 6"  (1.676 m)   Wt 213 lb (96.6 kg)   SpO2 97%   BMI 34.38 kg/m    Wt Readings from Last 3 Encounters:  05/05/22 213 lb (96.6 kg)  01/02/22 204 lb (92.5 kg)  04/07/21 198 lb (89.8 kg)     GEN: Well nourished, well developed in no acute distress NECK: No JVD; No carotid bruits CARDIAC: Regular rate and rhythm, no murmurs, rubs, gallops RESPIRATORY:  Clear to auscultation without rales, wheezing or rhonchi  ABDOMEN: Soft, non-tender, non-distended EXTREMITIES:  No edema; No deformity   ASSESSMENT AND PLAN:    Persistent atrial fibrillation S/p ablation x 3, most recently 2022 Continue eliquis 5 mg BID for CHA2DS2VASc  of at least 3 Continue flecainide 100 mg BID Start toprol 12.5 mg daily.  If she has significant bradycardia will need to consider alternate AAD vs stopping and following for AF  recurrence.  She has had prior QT prolongation on tikosyn.  Labs today.   HFrecEF NICM, tachy mediated EF normalized on echo 09/2020 Has not had any HF symptoms on flecainide  Follow up with Dr. Quentin Ore in 6 months  Signed, Shirley Friar, PA-C

## 2022-05-06 LAB — BASIC METABOLIC PANEL
BUN/Creatinine Ratio: 18 (ref 12–28)
BUN: 14 mg/dL (ref 8–27)
CO2: 22 mmol/L (ref 20–29)
Calcium: 9.3 mg/dL (ref 8.7–10.3)
Chloride: 107 mmol/L — ABNORMAL HIGH (ref 96–106)
Creatinine, Ser: 0.76 mg/dL (ref 0.57–1.00)
Glucose: 91 mg/dL (ref 70–99)
Potassium: 4 mmol/L (ref 3.5–5.2)
Sodium: 143 mmol/L (ref 134–144)
eGFR: 88 mL/min/{1.73_m2} (ref >59)

## 2022-05-06 LAB — CBC
Hematocrit: 41.1 % (ref 34.0–46.6)
Hemoglobin: 13.7 g/dL (ref 11.1–15.9)
MCH: 31.3 pg (ref 26.6–33.0)
MCHC: 33.3 g/dL (ref 31.5–35.7)
MCV: 94 fL (ref 79–97)
Platelets: 191 10*3/uL (ref 150–450)
RBC: 4.38 x10E6/uL (ref 3.77–5.28)
RDW: 12.9 % (ref 11.7–15.4)
WBC: 6.6 10*3/uL (ref 3.4–10.8)

## 2022-05-09 ENCOUNTER — Ambulatory Visit (INDEPENDENT_AMBULATORY_CARE_PROVIDER_SITE_OTHER): Payer: 59 | Admitting: Psychologist

## 2022-05-09 DIAGNOSIS — Z634 Disappearance and death of family member: Secondary | ICD-10-CM

## 2022-05-09 DIAGNOSIS — F330 Major depressive disorder, recurrent, mild: Secondary | ICD-10-CM | POA: Diagnosis not present

## 2022-05-09 NOTE — Progress Notes (Signed)
[facility] Counselor/Therapist Progress Note  Patient ID: Kiara [name], MRN: [id number],    Date: 05/09/2022  Time Spent: 01:07 pm to 01:50 pm; total time: 43 minutes   This session was held via video webex teletherapy due to the coronavirus risk at this time. The patient consented to video teletherapy and was located at her home during this session. She is aware it is the responsibility of the patient to secure confidentiality on her end of the session. The provider was in a private home office for the duration of this session. Limits of confidentiality were discussed with the patient.   Treatment Type: Individual Therapy  Reported Symptoms: Stress and sadness  Mental Status Exam: Appearance:  Well Groomed     Behavior: Appropriate  Motor: Normal  Speech/Language:  Clear and Coherent  Affect: Appropriate  Mood: normal  Thought process: normal  Thought content:   WNL  Sensory/Perceptual disturbances:   WNL  Orientation: oriented to person, place, and time/date  Attention: Good  Concentration: Good  Memory: WNL  Fund of knowledge:  Good  Insight:   Fair  Judgment:  Good  Impulse Control: Good   Risk Assessment: Danger to Self:  No Self-injurious Behavior: No Danger to Others: No Duty to Warn:no Physical Aggression / Violence:No  Access to Firearms a concern: No  Gang Involvement:No   Subjective: Beginning the session, patient described herself as okay indicating that the last two weeks have been eventful. Elaborating, she reflected on going to [place] to assist her daughter move back to [place]. After processing this event, she spent time talking about upcoming events in her life that she is concerned may cause some distress for her regarding family interactions. From there, she reflected on her experience in counseling. She processed what she learned about herself and how the therapeutic relationship assisted her. She was able to identify what she wants  to work on, with her future therapist. She processed thoughts and emotions.She denied suicidal and homicidal ideation.    Interventions:  Worked on building a therapeutic relationship with the patient using active listening and reflective statements. Provided emotional support using empathy and validation. Reviewed the treatment plan with the patient. Reviewed events since the last session. Normalized and validated expressed thoughts and emotions. Identified goals for the session. Processed the different thoughts related to upcoming family interactions. Assisted the patient in identifying goals for the future therapist to work towards. Processed the therapeutic experience what patient has learned about self. Reflected on how the therapeutic relationship assisted the patient. Identified the theme of safe space. Processed thoughts and emotions. Provided psychoeducation about how to contact office regarding future counseling. Provided empathic statements. Assessed for suicidal and homicidal ideation.   Homework: NA  Next Session: NA. Patient will transfer to a new clinician as this clinician leaves the department.   Diagnosis: F33.0 major depressive affective disorder, recurrent, mild and [id number] uncomplicated bereavement.   Plan:   Goals Alleviate depressive symptoms Recognize, accept, and cope with depressive feelings Develop healthy thinking patterns Develop healthy interpersonal relationships  Objectives target date for all objectives is 01/24/2023 Cooperate with a medication evaluation by a physician Verbalize an accurate understanding of depression Verbalize an understanding of the treatment Identify and replace thoughts that support depression Learn and implement behavioral strategies Verbalize an understanding and resolution of current interpersonal problems Learn and implement problem solving and decision making skills Learn and implement conflict resolution skills to resolve  interpersonal problems Verbalize an understanding of healthy and  unhealthy emotions verbalize insight into how past relationships may be influence current experiences with depression Use mindfulness and acceptance strategies and increase value based behavior  Increase hopeful statements about the future.  Interventions Consistent with treatment model, discuss how change in cognitive, behavioral, and interpersonal can help client alleviate depression CBT Behavioral activation help the client explore the relationship, nature of the dispute,  Help the client develop new interpersonal skills and relationships Conduct Problem so living therapy Teach conflict resolution skills Use a process-experiential approach Conduct TLDP Conduct ACT Evaluate need for psychotropic medication Monitor adherence to medication   Goals Begin a healthy grieving process Objectives target date for all objectives is 01/24/2023 Tell in detail the story of the current loss that is triggering symptoms Read books on the topic of grief Watch videos on the theme of grief Begin verbalizing feelings associated with the loss Attend a grief support group express thoughts and feelings about the deceased Identify and voice positives about the deceased implement acts of spiritual faith  Interventions create a safe environment and actively build trust use empathy, compassion, and support ask the patient to write a letter to the lost person conduct empty chair ask the patient to discuss and list the positives and negative aspects of the person encourage patient to rely upon his/her spiritual faith  ask client to read books on grief ask patient to watch videos about grief assist patient in identifying emotions  ask patient to attend support group   The patient and clinician reviewed the treatment plan on 02/01/2022. The patient approved of the treatment plan.    Conception Chancy, PsyD

## 2022-05-15 MED ORDER — LISINOPRIL 5 MG PO TABS: 5.0000 mg | ORAL_TABLET | Freq: Every day | ORAL | 3 refills | Status: DC

## 2022-05-15 MED ORDER — ATORVASTATIN CALCIUM 40 MG PO TABS: 40.0000 mg | ORAL_TABLET | Freq: Every day | ORAL | 3 refills | Status: DC

## 2022-05-18 DIAGNOSIS — I480 Paroxysmal atrial fibrillation: Secondary | ICD-10-CM

## 2022-05-18 MED ORDER — APIXABAN 5 MG PO TABS: 5.0000 mg | ORAL_TABLET | Freq: Two times a day (BID) | ORAL | 3 refills | Status: DC

## 2022-05-18 NOTE — Telephone Encounter (Signed)
Prescription refill request for Eliquis received. Indication: Afib  Last office visit: 05/05/22 Chalmers Cater)  Scr: 0.76 (05/05/22)  Age: 62 Weight: 96.6kg  Appropriate dose. Refill sent.

## 2022-06-02 ENCOUNTER — Ambulatory Visit: Payer: 59 | Admitting: Nurse Practitioner

## 2022-06-02 ENCOUNTER — Other Ambulatory Visit: Payer: Self-pay

## 2022-06-02 ENCOUNTER — Encounter: Payer: Self-pay | Admitting: Nurse Practitioner

## 2022-06-02 ENCOUNTER — Telehealth: Payer: Self-pay | Admitting: Nurse Practitioner

## 2022-06-02 VITALS — BP 122/78 | HR 65 | Ht 66.0 in | Wt 206.0 lb

## 2022-06-02 DIAGNOSIS — Z1211 Encounter for screening for malignant neoplasm of colon: Secondary | ICD-10-CM

## 2022-06-02 DIAGNOSIS — I4891 Unspecified atrial fibrillation: Secondary | ICD-10-CM

## 2022-06-02 MED ORDER — NA SULFATE-K SULFATE-MG SULF 17.5-3.13-1.6 GM/177ML PO SOLN: 1.0000 | Freq: Once | ORAL | 0 refills | Status: AC

## 2022-06-02 NOTE — Telephone Encounter (Signed)
Patient is calling to schedule colonoscopy, scheduled for 5/14 at 3:30 says she needs approval from cardiologist regarding blood thinner.

## 2022-06-02 NOTE — Patient Instructions (Addendum)
It has been recommended to you by your physician that you have a(n) Colonoscopy completed. Per your request, we did not schedule the procedure(s) today. Please contact our office at [phone] should you decide to have the procedure completed. You will be scheduled for a pre-visit and procedure at that time.  Due to recent changes in healthcare laws, you may see the results of your imaging and laboratory studies on MyChart before your provider has had a chance to review them.  We understand that in some cases there may be results that are confusing or concerning to you. Not all laboratory results come back in the same time frame and the provider may be waiting for multiple results in order to interpret others.  Please give [place] 48 hours in order for your provider to thoroughly review all the results before contacting the office for clarification of your results.    Thank you for trusting me with your gastrointestinal care!   Alcide Evener, CRNP

## 2022-06-02 NOTE — Telephone Encounter (Signed)
Contacted pt & Suprep was sent in to [facility] on [address] in [facility]. Instructions were also mailed.

## 2022-06-02 NOTE — Progress Notes (Signed)
06/02/2022 Kiara [name] [id number] Apr 07, 1960   CHIEF COMPLAINT:   HISTORY OF PRESENT ILLNESS: Kiara [name] is a 62 year old female with a past medical history of anxiety, depression, hypertension, hyperlipidemia, atrial fibrillation s/p ablation x 3 in 2015, 2016 and 2022 on Eliquis, nonobstructive coronary artery disease (LHC 07/17/13: LM 20%, ostial CFX 20-30% , nonischemic cardiomyopathy, asthma, left thyroid nodule, vitamin B 12 deficiency, spinal stenosis s/p two back surgeries and sacroplasty. She presents to our office today as referred by Dr. Richardean Chimera to discuss scheduling a screening colonoscopy.  She denies having any upper or lower abdominal pain.  She passes a normal formed brown bowel movement daily.  No rectal bleeding or black stools.  She endorsed undergoing a colonoscopy in 2012, possibly by [facility] which she reported was normal.  No known family history of colon polyps or colorectal cancer.  She has a history of atrial fibrillation on Eliquis.  She last saw Maxine Glenn cardiology PA-C 05/05/2022 and at that time she was in a normal sinus rhythm on Flecainide 100 mg twice daily and Metoprolol 12.5 mg was added at bedtime.  She denies having any recent runs of atrial fibrillation, no chest pain or shortness of breath.     Latest Ref Rng & Units 05/05/2022   12:24 PM 11/08/2020   10:15 AM 09/13/2020    3:30 PM  CBC  WBC 3.4 - 10.8 x10E3/uL 6.6  6.9  9.3   Hemoglobin 11.1 - 15.9 g/dL 40.9  81.1  91.4   Hematocrit 34.0 - 46.6 % 41.1  41.7  46.8   Platelets 150 - 450 x10E3/uL 191  185  205        Latest Ref Rng & Units 05/05/2022   12:24 PM 11/08/2020   10:15 AM 09/14/2020   12:05 AM  CMP  Glucose 70 - 99 mg/dL 91  87  84   BUN 8 - 27 mg/dL 14  9  15    Creatinine 0.57 - 1.00 mg/dL 7.82  9.56  2.13   Sodium 134 - 144 mmol/L 143  144  139   Potassium 3.5 - 5.2 mmol/L 4.0  4.0  3.7   Chloride 96 - 106 mmol/L 107  105  108   CO2 20 - 29 mmol/L 22  23  22    Calcium  8.7 - 10.3 mg/dL 9.3  9.5  9.1      ECHO 10/12/2020: IMPRESSIONS Left ventricular ejection fraction, by estimation, is 60 to 65%. Left ventricular ejection fraction by 3D volume is 62 %. The left ventricle has normal function. The left ventricle has no regional wall motion abnormalities. Left ventricular diastolic parameters were normal. 1. Right ventricular systolic function is normal. The right ventricular size is normal. There is normal pulmonary artery systolic pressure. 2. 3. Left atrial size was mildly dilated. 4. The mitral valve is normal in structure. Mild mitral valve regurgitation. The aortic valve is normal in structure. Aortic valve regurgitation is not visualized. No aortic stenosis is present.  Past Medical History:  Diagnosis Date   Atrial fibrillation    a. admx with AFib with RVR and acute systolic CHF 07/2013; TEE with LAA clot and no DCCV done;     Bilateral bunions 12/27/2020   Capsulitis of metatarsophalangeal (MTP) joint of right foot 01/31/2021   Chronic systolic heart failure    Coronary artery disease    a.  LHC (07/17/13):  LM 20%, ostial CFX 20-30%   Degenerative  disc disease, lumbar 10/30/2017   Degenerative scoliosis 10/30/2017   Essential hypertension 09/10/2020   Fatigue 04/13/2014   Generalized anxiety disorder 09/22/2019   History of asthma 07/21/2013   History of cervical dysplasia 12/22/2021   History of kidney stones 2013   History of lumbar spinal fusion 01/16/2019   Hypercholesterolemia 09/22/2019   Hyperlipemia    Hypothyroidism    past hx   Leg weakness, bilateral 11/28/2017   Long term (current) use of anticoagulants 10/30/2017   Loss of bladder control 11/28/2017   Lumbar radiculopathy 11/28/2017   Major depressive disorder 09/22/2019   Moderate mitral regurgitation 07/17/2013   NICM (nonischemic cardiomyopathy)    a. Echo (07/17/13):  EF 25-30%, EF subsequently normalized with sinus rhythm (tachycardia mediated CM)   Osteopenia    Overweight  07/18/2019   Sinus bradycardia 08/28/2013   Spondylolisthesis at L4-L5 level 10/30/2017   Past Surgical History:  Procedure Laterality Date   ABDOMINAL HYSTERECTOMY  ~ 2001   ABLATION  10/28/13   PVI by Dr Johney Frame   APPENDECTOMY  1978   ATRIAL FIBRILLATION ABLATION N/A 10/28/2013   Procedure: ATRIAL FIBRILLATION ABLATION;  Surgeon: Gardiner Rhyme, MD;  Location: [facility] CATH LAB;  Service: Cardiovascular;  Laterality: N/A;   ATRIAL FIBRILLATION ABLATION N/A 11/23/2020   Procedure: ATRIAL FIBRILLATION ABLATION;  Surgeon: Hillis Range, MD;  Location: [facility] INVASIVE CV LAB;  Service: Cardiovascular;  Laterality: N/A;   BACK SURGERY Bilateral 2019   Fusion   CARDIAC CATHETERIZATION  07/2013   CARDIOVERSION N/A 08/18/2013   Procedure: CARDIOVERSION;  Surgeon: Donato Schultz, MD;  Location[facility] ENDOSCOPY;  Service: Cardiovascular;  Laterality: N/A;   CARDIOVERSION N/A 08/29/2013   Procedure: CARDIOVERSION;  Surgeon: Pricilla Riffle, MD;  Location: [facility] OR;  Service: Cardiovascular;  Laterality: N/A;   ELECTROPHYSIOLOGIC STUDY N/A 11/05/2014   Procedure: Atrial Fibrillation Ablation;  Surgeon: Hillis Range, MD;  Location: [facility] INVASIVE CV LAB;  Service: Cardiovascular;  Laterality: N/A;   EYE SURGERY Bilateral 1999   Corrective laser surgery   implantable loop recorder removal  10/02/2018   MDT Reveal LINQ removed in office by Dr Johney Frame for EOL battery   IR SACROPLASTY BILATERAL  02/06/2019   LEFT HEART CATHETERIZATION WITH CORONARY ANGIOGRAM N/A 07/17/2013   Procedure: LEFT HEART CATHETERIZATION WITH CORONARY ANGIOGRAM;  Surgeon: Micheline Chapman, MD;  Location: [facility] CATH LAB;  Service: Cardiovascular;  Laterality: N/A;   LOOP RECORDER IMPLANT N/A 06/05/2014   Procedure: LOOP RECORDER IMPLANT;  Surgeon: Hillis Range, MD;  Location: [facility] CATH LAB;  Service: Cardiovascular;  Laterality: N/A;   TEE WITHOUT CARDIOVERSION N/A 07/18/2013   Procedure: TRANSESOPHAGEAL ECHOCARDIOGRAM (TEE);  Surgeon: Lewayne Bunting, MD;  Location:  [facility] ENDOSCOPY;  Service: Cardiovascular;  Laterality: N/A;   TEE WITHOUT CARDIOVERSION N/A 08/18/2013   Procedure: TRANSESOPHAGEAL ECHOCARDIOGRAM (TEE);  Surgeon: Donato Schultz, MD;  Location: [facility] ENDOSCOPY;  Service: Cardiovascular;  Laterality: N/A;   TEE WITHOUT CARDIOVERSION N/A 10/27/2013   Procedure: TRANSESOPHAGEAL ECHOCARDIOGRAM (TEE);  Surgeon: Pricilla Riffle, MD;  Location: [facility] ENDOSCOPY;  Service: Cardiovascular;  Laterality: N/A;   TEE WITHOUT CARDIOVERSION N/A 11/04/2014   Procedure: TRANSESOPHAGEAL ECHOCARDIOGRAM (TEE);  Surgeon: Chrystie Nose, MD;  Location: [facility] ENDOSCOPY;  Service: Cardiovascular;  Laterality: N/A;   Social History: She is married.  She has 1 daughter.  She is a Futures trader.  Non-smoker.  No alcohol use.  No drug use.  Family History: Mother with history of non-Hodgkin's lymphoma and Alzheimer's, deceased.  Father deceased at  the age of 58 unexpectedly.  No known family history of esophageal, gastric or colon cancer.  Maternal grandmother with history of Alzheimer's disease.  Allergies  Allergen Reactions   Azithromycin Diarrhea and Nausea And Vomiting   Other Nausea And Vomiting and Other (See Comments)    WEGOVY   Tape Rash    Surgical       Outpatient Encounter Medications as of 06/02/2022  Medication Sig   acetaminophen (TYLENOL) 500 MG tablet Take 1,000-2,000 mg by mouth every 6 (six) hours as needed for mild pain or headache.   apixaban (ELIQUIS) 5 MG TABS tablet Take 1 tablet (5 mg total) by mouth 2 (two) times daily.   atorvastatin (LIPITOR) 40 MG tablet Take 1 tablet (40 mg total) by mouth daily.   buPROPion (WELLBUTRIN XL) 150 MG 24 hr tablet Take 150 mg by mouth daily.   cyanocobalamin 2000 MCG tablet Take 5,000 mcg by mouth daily. One daily   flecainide (TAMBOCOR) 100 MG tablet Take 1 tablet (100 mg total) by mouth 2 (two) times daily.   furosemide (LASIX) 20 MG tablet TAKE 1 TABLET BY MOUTH EVERY OTHER DAY   hydrOXYzine (ATARAX/VISTARIL) 25 MG tablet  Take 25 mg by mouth at bedtime.   KLOR-CON M10 10 MEQ tablet TAKE 1 TABLET (10 MEQ TOTAL) BY MOUTH EVERY OTHER DAY. WHEN TAKING LASIX   lisinopril (ZESTRIL) 5 MG tablet Take 1 tablet (5 mg total) by mouth daily.   metoprolol succinate (TOPROL XL) 25 MG 24 hr tablet Take 0.5 tablets (12.5 mg total) by mouth at bedtime.   metoprolol tartrate (LOPRESSOR) 25 MG tablet TAKE 0.5 TABLETS (12.5 MG TOTAL) BY MOUTH AS DIRECTED. (Patient taking differently: Take 25 mg by mouth daily as needed (Afib).)   Multiple Vitamin (MULTIVITAMIN) tablet Take 1 tablet by mouth daily. Woman's 50 +   nystatin-triamcinolone (MYCOLOG II) cream Apply 1 application topically 2 (two) times daily as needed for rash.   pramipexole (MIRAPEX) 0.25 MG tablet Take 0.25-0.5 mg by mouth at bedtime.   sertraline (ZOLOFT) 100 MG tablet Take 200 mg by mouth every morning.    Vitamin D3 (VITAMIN D) 25 MCG tablet Take 1,000 Units by mouth daily.   Facility-Administered Encounter Medications as of 06/02/2022  Medication   0.9 %  sodium chloride infusion   0.9 %  sodium chloride infusion    REVIEW OF SYSTEMS:  Gen: Denies fever, sweats or chills. No weight loss.  CV: See HPI. No edema. Resp: Denies cough, shortness of breath of hemoptysis.  GI: Denies heartburn, dysphagia, stomach or lower abdominal pain. No diarrhea or constipation.  GU : Denies urinary burning, blood in urine, increased urinary frequency or incontinence. MS: Denies joint pain, muscles aches or weakness. Derm: Denies rash, itchiness, skin lesions or unhealing ulcers. Psych: + Anxiety and depression, grieving the loss of her mother and father. Heme: Denies bruising, easy bleeding. Neuro:  Denies headaches, dizziness or paresthesias. Endo:  Denies any problems with DM, thyroid or adrenal function.  PHYSICAL EXAM: BP 122/78   Pulse 65   Ht 5\' 6"  (1.676 m)   Wt 206 lb (93.4 kg)   BMI 33.25 kg/m   General: 62 year old female in no acute distress. Head:  Normocephalic and atraumatic. Eyes:  Sclerae non-icteric, conjunctive pink. Ears: Normal auditory acuity. Mouth: Dentition intact. No ulcers or lesions.  Neck: Supple, no lymphadenopathy or thyromegaly.  Lungs: Clear bilaterally to auscultation without wheezes, crackles or rhonchi. Heart: Regular rate and rhythm. No murmur, rub or  gallop appreciated.  Abdomen: Soft, nontender, nondistended. No masses. No hepatosplenomegaly. Normoactive bowel sounds x 4 quadrants.  Rectal: Deferred. Musculoskeletal: Symmetrical with no gross deformities. Skin: Warm and dry. No rash or lesions on visible extremities. Extremities: No edema. Neurological: Alert oriented x 4, no focal deficits.  Psychological:  Alert and cooperative. Normal mood and affect.  ASSESSMENT AND PLAN:  62 year old female presents to schedule a screening colonoscopy.  Normal colonoscopy in 2012 per the patient.  No known family history of colorectal cancer. -Colonoscopy benefits and risks discussed including risk with sedation, risk of bleeding, perforation and infection  -Our office will contact the patient's cardiologist to verify Eliquis hold prior to proceeding with a colonoscopy -Patient will contact our office if she has any changes in her cardiac status prior to her procedure date  Atrial fibrillation s/p ablation x 3 on Eliquis Flecainide and Metoprolol. CHA2DS2VASc of at least 3.   Nonischemic cardiomyopathy, tachycardia mediated.  LVEF 60 to 65% with normal RV function per echo 09/2020.  Non obstructive coronary artery disease per left heart cath 07/17/2013: LM 20%, CFX 20 to 30%.   CC:  Fatima Sanger, FNP

## 2022-06-26 NOTE — Telephone Encounter (Addendum)
Pt was scheduled for a colonoscopy on 06/27/22 with Dr. Lavon Paganini. Did not see cardiac clearance in chart, spoke with patient and she stated that she had not received any instruction on when to hold her eliquis. She did stop taking it last Thursday night. Informed patient that we could not do the procedure without cardiac clearance for her safety. Spoke with MD and was advised to reschedule patient. Rescheduled patient to 07/04/22 at 10:00. Advised patient to resume eliquis today, and that she should hear back later this week on when to hold her eliquis for upcoming procedure. New instructions sent to patient via mychart, pt verbalized understanding and had no further concerns at the end of the call.

## 2022-06-27 ENCOUNTER — Telehealth: Payer: Self-pay

## 2022-06-27 ENCOUNTER — Encounter: Payer: Self-pay | Admitting: Gastroenterology

## 2022-06-27 ENCOUNTER — Encounter: Payer: 59 | Admitting: Gastroenterology

## 2022-06-27 NOTE — Telephone Encounter (Signed)
Patient was scheduled for colonoscopy this afternoon but no instructions regarding Elquis hold and is not clear if the request was even sent to Cardiology for Eliquis hold.  Sheri, can you please check and help assign it to appropriate CMA. Courtney rescheduled the procedure to next week. Thanks

## 2022-06-27 NOTE — Telephone Encounter (Signed)
Contacted pt & pt is aware that I am working on clearance to hold Eliquis.

## 2022-06-27 NOTE — Telephone Encounter (Signed)
Contacted pt & pt verbalized understanding that we are waiting to hear back about Eliquis hold from cardiology.

## 2022-06-27 NOTE — Telephone Encounter (Signed)
Denyzha, please contact the patient and obtain cardiology clearance for her procedure.

## 2022-06-27 NOTE — Telephone Encounter (Signed)
[facility] Pre-operative Risk Assessment     Request for surgical clearance:     Endoscopy Procedure  What type of surgery is being performed?     Colonoscopy  When is this surgery scheduled?     07/04/22  What type of clearance is required ?   Pharmacy  Are there any medications that need to be held prior to surgery and how long? Eliquis & 2 days  Practice name and name of physician performing surgery?      [facility]  What is your office phone and fax number?      Phone- [phone]  Fax- [phone]  Anesthesia type (None, local, MAC, general) ?       MAC

## 2022-06-27 NOTE — Telephone Encounter (Signed)
   Patient Name: MALISE YANIK  DOB: 03-08-1960 MRN: [id number]  Primary Cardiologist: Hillis Range, MD (Inactive)  Chart reviewed as part of pre-operative protocol coverage. Pre-op clearance already addressed by colleagues in earlier phone notes. To summarize recommendations:  -Per office protocol, patient can hold Eliquis for 2 days prior to procedure.   Patient will not need bridging with Lovenox (enoxaparin) around procedure.  Medical clearance was not requested.   Will route this bundled recommendation to requesting provider via Epic fax function and remove from pre-op pool. Please call with questions.  Sharlene Dory, PA-C 06/27/2022, 4:34 PM

## 2022-06-27 NOTE — Telephone Encounter (Signed)
Patient with diagnosis of PAF on Eliquis for anticoagulation.    Procedure: Colonoscopy  Date of procedure: 07/04/2022   CHA2DS2-VASc Score = 4   This indicates a 4.8% annual risk of stroke. The patient's score is based upon: CHF History: 1 HTN History: 1 Diabetes History: 0 Stroke History: 0 Vascular Disease History: 1 Age Score: 0 Gender Score: 1   CrCl 87 mL/min (Adj BW)(SrCr 0.76 05/05/2022) Platelet count 191 K 05/05/2022    Per office protocol, patient can hold Eliquis for 2 days prior to procedure.   Patient will not need bridging with Lovenox (enoxaparin) around procedure.  **This guidance is not considered finalized until pre-operative APP has relayed final recommendations.**

## 2022-06-28 NOTE — Telephone Encounter (Signed)
Contacted pt & pt verbalized understanding to hold Eliquis 2 days prior to her procedure per Cardiology.

## 2022-07-04 ENCOUNTER — Encounter: Payer: Self-pay | Admitting: Gastroenterology

## 2022-07-04 ENCOUNTER — Ambulatory Visit (AMBULATORY_SURGERY_CENTER): Payer: 59 | Admitting: Gastroenterology

## 2022-07-04 VITALS — BP 96/56 | HR 55 | Temp 98.2°F | Resp 20 | Ht 66.0 in | Wt 206.0 lb

## 2022-07-04 DIAGNOSIS — Z1211 Encounter for screening for malignant neoplasm of colon: Secondary | ICD-10-CM

## 2022-07-04 DIAGNOSIS — D122 Benign neoplasm of ascending colon: Secondary | ICD-10-CM | POA: Diagnosis not present

## 2022-07-04 DIAGNOSIS — D123 Benign neoplasm of transverse colon: Secondary | ICD-10-CM | POA: Diagnosis not present

## 2022-07-04 DIAGNOSIS — K635 Polyp of colon: Secondary | ICD-10-CM | POA: Diagnosis not present

## 2022-07-04 LAB — LAB REPORT - SCANNED: EGFR: 80

## 2022-07-04 MED ORDER — SODIUM CHLORIDE 0.9 % IV SOLN
500.0000 mL | Freq: Once | INTRAVENOUS | Status: DC
Start: 2022-07-04 — End: 2022-07-04

## 2022-07-04 NOTE — Progress Notes (Signed)
Pt's states no medical or surgical changes since previsit or office visit. 

## 2022-07-04 NOTE — Patient Instructions (Addendum)
-   Resume previous diet. - Continue present medications. - Await pathology results. - Repeat colonoscopy in 5 years for surveillance based on pathology results. - For future colonoscopy the patient will require an extended preparation. If there are any questions, please contact the gastroenterologist.  YOU HAD AN ENDOSCOPIC PROCEDURE TODAY AT THE [facility]:   Refer to the procedure report that was given to you for any specific questions about what was found during the examination.  If the procedure report does not answer your questions, please call your gastroenterologist to clarify.  If you requested that your care partner not be given the details of your procedure findings, then the procedure report has been included in a sealed envelope for you to review at your convenience later.  YOU SHOULD EXPECT: Some feelings of bloating in the abdomen. Passage of more gas than usual.  Walking can help get rid of the air that was put into your GI tract during the procedure and reduce the bloating. If you had a lower endoscopy (such as a colonoscopy or flexible sigmoidoscopy) you may notice spotting of blood in your stool or on the toilet paper. If you underwent a bowel prep for your procedure, you may not have a normal bowel movement for a few days.  Please Note:  You might notice some irritation and congestion in your nose or some drainage.  This is from the oxygen used during your procedure.  There is no need for concern and it should clear up in a day or so.  SYMPTOMS TO REPORT IMMEDIATELY:  Following lower endoscopy (colonoscopy or flexible sigmoidoscopy):  Excessive amounts of blood in the stool  Significant tenderness or worsening of abdominal pains  Swelling of the abdomen that is new, acute  Fever of 100F or higher  For urgent or emergent issues, a gastroenterologist can be reached at any hour by calling [phone]. Do not use MyChart messaging for urgent concerns.    DIET:   We do recommend a small meal at first, but then you may proceed to your regular diet.  Drink plenty of fluids but you should avoid alcoholic beverages for 24 hours.  ACTIVITY:  You should plan to take it easy for the rest of today and you should NOT DRIVE or use heavy machinery until tomorrow (because of the sedation medicines used during the test).    FOLLOW UP: Our staff will call the number listed on your records the next business day following your procedure.  We will call around 7:15- 8:00 am to check on you and address any questions or concerns that you may have regarding the information given to you following your procedure. If we do not reach you, we will leave a message.     If any biopsies were taken you will be contacted by phone or by letter within the next 1-3 weeks.  Please call us at [phone] if you have not heard about the biopsies in 3 weeks.    SIGNATURES/CONFIDENTIALITY: You and/or your care partner have signed paperwork which will be entered into your electronic medical record.  These signatures attest to the fact that that the information above on your After Visit Summary has been reviewed and is understood.  Full responsibility of the confidentiality of this discharge information lies with you and/or your care-partner.

## 2022-07-04 NOTE — Progress Notes (Signed)
[facility] History and Physical   Primary Care Physician:  [name], MD   Reason for Procedure:  Colorectal cancer screening  Plan:    Screening colonoscopy with possible interventions as needed     HPI: Kiara [name] is a very pleasant 62 y.o. female here for screening colonoscopy. Denies any nausea, vomiting, abdominal pain, melena or bright red blood per rectum  The risks and benefits as well as alternatives of endoscopic procedure(s) have been discussed and reviewed. All questions answered. The patient agrees to proceed.    Past Medical History:  Diagnosis Date   Atrial fibrillation    a. admx with AFib with RVR and acute systolic CHF 07/2013; TEE with LAA clot and no DCCV done;     Bilateral bunions 12/27/2020   Capsulitis of metatarsophalangeal (MTP) joint of right foot 01/31/2021   Chronic systolic heart failure    Coronary artery disease    a.  LHC (07/17/13):  LM 20%, ostial CFX 20-30%   Degenerative disc disease, lumbar 10/30/2017   Degenerative scoliosis 10/30/2017   Essential hypertension 09/10/2020   Fatigue 04/13/2014   Generalized anxiety disorder 09/22/2019   History of asthma 07/21/2013   History of cervical dysplasia 12/22/2021   History of kidney stones 2013   History of lumbar spinal fusion 01/16/2019   Hypercholesterolemia 09/22/2019   Hyperlipemia    Hypothyroidism    past hx   Leg weakness, bilateral 11/28/2017   Long term (current) use of anticoagulants 10/30/2017   Loss of bladder control 11/28/2017   Lumbar radiculopathy 11/28/2017   Major depressive disorder 09/22/2019   Moderate mitral regurgitation 07/17/2013   NICM (nonischemic cardiomyopathy)    a. Echo (07/17/13):  EF 25-30%, EF subsequently normalized with sinus rhythm (tachycardia mediated CM)   Osteopenia    Overweight 07/18/2019   Sinus bradycardia 08/28/2013   Spondylolisthesis at L4-L5 level 10/30/2017    Past Surgical History:  Procedure Laterality Date    ABDOMINAL HYSTERECTOMY  ~ 2001   ABLATION  10/28/13   PVI by Dr Johney Frame   APPENDECTOMY  1978   ATRIAL FIBRILLATION ABLATION N/A 10/28/2013   Procedure: ATRIAL FIBRILLATION ABLATION;  Surgeon: Gardiner Rhyme, MD;  Location: [facility] CATH LAB;  Service: Cardiovascular;  Laterality: N/A;   ATRIAL FIBRILLATION ABLATION N/A 11/23/2020   Procedure: ATRIAL FIBRILLATION ABLATION;  Surgeon: Hillis Range, MD;  Location: [facility] INVASIVE CV LAB;  Service: Cardiovascular;  Laterality: N/A;   BACK SURGERY Bilateral 2019   Fusion   CARDIAC CATHETERIZATION  07/2013   CARDIOVERSION N/A 08/18/2013   Procedure: CARDIOVERSION;  Surgeon: Donato Schultz, MD;  Location: [facility] ENDOSCOPY;  Service: Cardiovascular;  Laterality: N/A;   CARDIOVERSION N/A 08/29/2013   Procedure: CARDIOVERSION;  Surgeon: Pricilla Riffle, MD;  Location: [facility] OR;  Service: Cardiovascular;  Laterality: N/A;   ELECTROPHYSIOLOGIC STUDY N/A 11/05/2014   Procedure: Atrial Fibrillation Ablation;  Surgeon: Hillis Range, MD;  Location: [facility] INVASIVE CV LAB;  Service: Cardiovascular;  Laterality: N/A;   EYE SURGERY Bilateral 1999   Corrective laser surgery   implantable loop recorder removal  10/02/2018   MDT Reveal LINQ removed in office by Dr Johney Frame for EOL battery   IR SACROPLASTY BILATERAL  02/06/2019   LEFT HEART CATHETERIZATION WITH CORONARY ANGIOGRAM N/A 07/17/2013   Procedure: LEFT HEART CATHETERIZATION WITH CORONARY ANGIOGRAM;  Surgeon: Micheline Chapman, MD;  Location: [facility] CATH LAB;  Service: Cardiovascular;  Laterality: N/A;   LOOP RECORDER IMPLANT N/A 06/05/2014   Procedure: LOOP RECORDER IMPLANT;  Surgeon: Hillis Range, MD;  Location: [facility] CATH LAB;  Service: Cardiovascular;  Laterality: N/A;   TEE WITHOUT CARDIOVERSION N/A 07/18/2013   Procedure: TRANSESOPHAGEAL ECHOCARDIOGRAM (TEE);  Surgeon: Lewayne Bunting, MD;  Location: [facility] ENDOSCOPY;  Service: Cardiovascular;  Laterality: N/A;   TEE WITHOUT CARDIOVERSION N/A 08/18/2013   Procedure: TRANSESOPHAGEAL ECHOCARDIOGRAM  (TEE);  Surgeon: Donato Schultz, MD;  Location: [facility] ENDOSCOPY;  Service: Cardiovascular;  Laterality: N/A;   TEE WITHOUT CARDIOVERSION N/A 10/27/2013   Procedure: TRANSESOPHAGEAL ECHOCARDIOGRAM (TEE);  Surgeon: Pricilla Riffle, MD;  Location: [facility] ENDOSCOPY;  Service: Cardiovascular;  Laterality: N/A;   TEE WITHOUT CARDIOVERSION N/A 11/04/2014   Procedure: TRANSESOPHAGEAL ECHOCARDIOGRAM (TEE);  Surgeon: Chrystie Nose, MD;  Location: [facility] ENDOSCOPY;  Service: Cardiovascular;  Laterality: N/A;    Prior to Admission medications   Medication Sig Start Date End Date Taking? Authorizing Provider  acetaminophen (TYLENOL) 500 MG tablet Take 1,000-2,000 mg by mouth every 6 (six) hours as needed for mild pain or headache.   Yes [provider]  atorvastatin (LIPITOR) 40 MG tablet Take 1 tablet (40 mg total) by mouth daily. 05/15/22  Yes Graciella Freer, PA-C  buPROPion (WELLBUTRIN XL) 150 MG 24 hr tablet Take 150 mg by mouth daily. 09/09/15  Yes [provider]  flecainide (TAMBOCOR) 100 MG tablet Take 1 tablet (100 mg total) by mouth 2 (two) times daily. 05/05/22  Yes Graciella Freer, PA-C  furosemide (LASIX) 20 MG tablet TAKE 1 TABLET BY MOUTH EVERY OTHER DAY 08/02/21  Yes Allred, Fayrene Fearing, MD  hydrOXYzine (ATARAX/VISTARIL) 25 MG tablet Take 25 mg by mouth at bedtime.   Yes [provider]  KLOR-CON M10 10 MEQ tablet TAKE 1 TABLET (10 MEQ TOTAL) BY MOUTH EVERY OTHER DAY. WHEN TAKING LASIX 01/27/21  Yes Allred, Fayrene Fearing, MD  lisinopril (ZESTRIL) 5 MG tablet Take 1 tablet (5 mg total) by mouth daily. 05/15/22  Yes Graciella Freer, PA-C  metoprolol succinate (TOPROL XL) 25 MG 24 hr tablet Take 0.5 tablets (12.5 mg total) by mouth at bedtime. 05/05/22  Yes Graciella Freer, PA-C  Multiple Vitamin (MULTIVITAMIN) tablet Take 1 tablet by mouth daily. Woman's 50 +   Yes [provider]  pramipexole (MIRAPEX) 0.25 MG tablet Take 0.25-0.5 mg by mouth at bedtime.   Yes  [provider]  sertraline (ZOLOFT) 100 MG tablet Take 200 mg by mouth every morning.  07/04/13  Yes [provider]  Vitamin D3 (VITAMIN D) 25 MCG tablet Take 1,000 Units by mouth daily.   Yes [provider]  apixaban (ELIQUIS) 5 MG TABS tablet Take 1 tablet (5 mg total) by mouth 2 (two) times daily. 05/18/22   Lanier Prude, MD  cyanocobalamin 2000 MCG tablet Take 5,000 mcg by mouth daily. One daily Patient not taking: Reported on 07/04/2022    [provider]  nystatin-triamcinolone (MYCOLOG II) cream Apply 1 application topically 2 (two) times daily as needed for rash. Patient not taking: Reported on 07/04/2022 09/09/20   [provider]    Current Outpatient Medications  Medication Sig Dispense Refill   acetaminophen (TYLENOL) 500 MG tablet Take 1,000-2,000 mg by mouth every 6 (six) hours as needed for mild pain or headache.     atorvastatin (LIPITOR) 40 MG tablet Take 1 tablet (40 mg total) by mouth daily. 90 tablet 3   buPROPion (WELLBUTRIN XL) 150 MG 24 hr tablet Take 150 mg by mouth daily.     flecainide (TAMBOCOR) 100 MG tablet Take  1 tablet (100 mg total) by mouth 2 (two) times daily. 180 tablet 3   furosemide (LASIX) 20 MG tablet TAKE 1 TABLET BY MOUTH EVERY OTHER DAY 45 tablet 3   hydrOXYzine (ATARAX/VISTARIL) 25 MG tablet Take 25 mg by mouth at bedtime.     KLOR-CON M10 10 MEQ tablet TAKE 1 TABLET (10 MEQ TOTAL) BY MOUTH EVERY OTHER DAY. WHEN TAKING LASIX 45 tablet 2   lisinopril (ZESTRIL) 5 MG tablet Take 1 tablet (5 mg total) by mouth daily. 90 tablet 3   metoprolol succinate (TOPROL XL) 25 MG 24 hr tablet Take 0.5 tablets (12.5 mg total) by mouth at bedtime. 45 tablet 3   Multiple Vitamin (MULTIVITAMIN) tablet Take 1 tablet by mouth daily. Woman's 50 +     pramipexole (MIRAPEX) 0.25 MG tablet Take 0.25-0.5 mg by mouth at bedtime.     sertraline (ZOLOFT) 100 MG tablet Take 200 mg by mouth every morning.      Vitamin D3 (VITAMIN D)  25 MCG tablet Take 1,000 Units by mouth daily.     apixaban (ELIQUIS) 5 MG TABS tablet Take 1 tablet (5 mg total) by mouth 2 (two) times daily. 180 tablet 3   cyanocobalamin 2000 MCG tablet Take 5,000 mcg by mouth daily. One daily (Patient not taking: Reported on 07/04/2022)     nystatin-triamcinolone (MYCOLOG II) cream Apply 1 application topically 2 (two) times daily as needed for rash. (Patient not taking: Reported on 07/04/2022)     Current Facility-Administered Medications  Medication Dose Route Frequency Provider Last Rate Last Admin   0.9 %  sodium chloride infusion  500 mL Intravenous Once Ngoc Detjen, Eleonore Chiquito, MD       Facility-Administered Medications Ordered in Other Visits  Medication Dose Route Frequency Provider Last Rate Last Admin   0.9 %  sodium chloride infusion   Intravenous Continuous Ralene Muskrat, PA-C       0.9 %  sodium chloride infusion   Intravenous Continuous Ralene Muskrat, PA-C        Allergies as of 07/04/2022 - Review Complete 07/04/2022  Allergen Reaction Noted   Azithromycin Diarrhea and Nausea And Vomiting 07/17/2013   Other Nausea And Vomiting and Other (See Comments) 01/11/2021   Tape Rash 11/13/2019    Family History  Problem Relation Age of Onset   Hypertension Mother    Lymphoma Mother    Alzheimer's disease Mother        with behavioral disturbances   Dementia Mother    Hypertension Father    Dementia Maternal Grandmother    Alzheimer's disease Maternal Grandmother    Colon cancer Neg Hx    Stomach cancer Neg Hx    Esophageal cancer Neg Hx     Social History   Socioeconomic History   Marital status: Married    Spouse name: Not on file   Number of children: 1   Years of education: 16   Highest education level: Bachelor's degree (e.g., BA, AB, BS)  Occupational History   Occupation: Retired   Occupation: Arts development officer  Tobacco Use   Smoking status: Never   Smokeless tobacco: Never  Vaping Use   Vaping Use: Never used  Substance and  Sexual Activity   Alcohol use: Yes    Alcohol/week: 3.0 standard drinks of alcohol    Types: 3 Cans of beer per week    Comment: 3 beers a week   Drug use: No   Sexual activity: Yes  Other Topics Concern   Not  on file  Social History Narrative   Left hand    Caffeine 1 cup a day or 2   One story home   Social Determinants of Health   Financial Resource Strain: Not on file  Food Insecurity: Not on file  Transportation Needs: Not on file  Physical Activity: Not on file  Stress: Not on file  Social Connections: Not on file  Intimate Partner Violence: Not on file    Review of Systems:  All other review of systems negative except as mentioned in the HPI.  Physical Exam: Vital signs in last 24 hours: Blood Pressure 121/64   Pulse (Abnormal) 55   Temperature 98.2 F (36.8 C) (Skin)   Respiration 14   Height 5\' 6"  (1.676 m)   Weight 206 lb (93.4 kg)   Oxygen Saturation 96%   Body Mass Index 33.25 kg/m  General:   Alert, NAD Lungs:  Clear .   Heart:  Regular rate and rhythm Abdomen:  Soft, nontender and nondistended. Neuro/Psych:  Alert and cooperative. Normal mood and affect. A and O x 3  Reviewed labs, radiology imaging, old records and pertinent past GI work up  Patient is appropriate for planned procedure(s) and anesthesia in an ambulatory setting   K. Scherry Ran , MD [phone]

## 2022-07-04 NOTE — Progress Notes (Signed)
Uneventful anesthetic. Report to pacu rn. Vss. Care resumed by rn. 

## 2022-07-04 NOTE — Op Note (Addendum)
[facility] Patient Name: Kiara [name] Procedure Date: 07/04/2022 9:58 AM MRN: [id number] Endoscopist: Napoleon Form , MD, [id number] Age: 62 Referring MD:  Date of Birth: 1960/04/03 Gender: Female Account #: [id number] Procedure:                Colonoscopy Indications:              Screening for colorectal malignant neoplasm Medicines:                Monitored Anesthesia Care Procedure:                Pre-Anesthesia Assessment:                           - Prior to the procedure, a History and Physical                            was performed, and patient medications and                            allergies were reviewed. The patient's tolerance of                            previous anesthesia was also reviewed. The risks                            and benefits of the procedure and the sedation                            options and risks were discussed with the patient.                            All questions were answered, and informed consent                            was obtained. Prior Anticoagulants: The patient                            last took Eliquis (apixaban) 2 days prior to the                            procedure. ASA Grade Assessment: II - A patient                            with mild systemic disease. After reviewing the                            risks and benefits, the patient was deemed in                            satisfactory condition to undergo the procedure.                           After obtaining informed consent, the colonoscope  was passed under direct vision. Throughout the                            procedure, the patient's blood pressure, pulse, and                            oxygen saturations were monitored continuously. The                            [id number]-HQ190L Colonoscope [id number] was introduced                            through the anus and advanced to the the cecum,                             identified by appendiceal orifice and ileocecal                            valve. The colonoscopy was performed without                            difficulty. The patient tolerated the procedure                            well. The quality of the bowel preparation was                            adequate to identify polyps greater than 5 mm in                            size. The ileocecal valve, appendiceal orifice, and                            rectum were photographed. Scope In: 10:08:06 AM Scope Out: 10:22:19 AM Scope Withdrawal Time: 0 hours 10 minutes 21 seconds  Total Procedure Duration: 0 hours 14 minutes 13 seconds  Findings:                 The perianal and digital rectal examinations were                            normal.                           A 2 mm polyp was found in the ascending colon. The                            polyp was sessile. The polyp was removed with a                            cold biopsy forceps. Resection and retrieval were                            complete.  A 7 mm polyp was found in the transverse colon. The                            polyp was sessile. The polyp was removed with a                            cold snare. Resection and retrieval were complete.                           Scattered medium-mouthed and small-mouthed                            diverticula were found in the sigmoid colon and                            descending colon.                           Non-bleeding external and internal hemorrhoids were                            found during retroflexion. The hemorrhoids were                            medium-sized. Complications:            No immediate complications. Estimated Blood Loss:     Estimated blood loss was minimal. Impression:               - One 2 mm polyp in the ascending colon, removed                            with a cold biopsy forceps. Resected and retrieved.                           -  One 7 mm polyp in the transverse colon, removed                            with a cold snare. Resected and retrieved.                           - Diverticulosis in the sigmoid colon and in the                            descending colon.                           - Non-bleeding external and internal hemorrhoids. Recommendation:           - Patient has a contact number available for                            emergencies. The signs and symptoms of potential  delayed complications were discussed with the                            patient. Return to normal activities tomorrow.                            Written discharge instructions were provided to the                            patient.                           - Resume previous diet.                           - Continue present medications.                           - Await pathology results.                           - Repeat colonoscopy in 5 years for surveillance                            based on pathology results.                           - For future colonoscopy the patient will require                            an extended preparation. If there are any                            questions, please contact the gastroenterologist.                           - Resume Eliquis (apixaban) at prior dose tomorrow.                            Refer to managing physician for further adjustment                            of therapy. Napoleon Form, MD 07/04/2022 10:30:18 AM This report has been signed electronically.

## 2022-07-05 ENCOUNTER — Telehealth: Payer: Self-pay | Admitting: *Deleted

## 2022-07-05 NOTE — Telephone Encounter (Signed)
  Follow up Call-     07/04/2022    9:26 AM  Call back number  Post procedure Call Back phone  # [phone]  Permission to leave phone message Yes     Patient questions:  Do you have a fever, pain , or abdominal swelling? No. Pain Score  0 *  Have you tolerated food without any problems? Yes.    Have you been able to return to your normal activities? Yes.    Do you have any questions about your discharge instructions: Diet   No. Medications  No. Follow up visit  No.  Do you have questions or concerns about your Care? No.  Actions: * If pain score is 4 or above: No action needed, pain <4.

## 2022-07-08 ENCOUNTER — Encounter: Payer: Self-pay | Admitting: Gastroenterology

## 2022-07-17 ENCOUNTER — Ambulatory Visit (INDEPENDENT_AMBULATORY_CARE_PROVIDER_SITE_OTHER): Payer: 59 | Admitting: Behavioral Health

## 2022-07-17 ENCOUNTER — Encounter: Payer: Self-pay | Admitting: Behavioral Health

## 2022-07-17 DIAGNOSIS — F4321 Adjustment disorder with depressed mood: Secondary | ICD-10-CM | POA: Diagnosis not present

## 2022-07-17 DIAGNOSIS — F331 Major depressive disorder, recurrent, moderate: Secondary | ICD-10-CM

## 2022-07-17 NOTE — Progress Notes (Signed)
                Dolphus Linch M Ilithyia Titzer, LCMHC 
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
## 2022-08-03 ENCOUNTER — Ambulatory Visit: Payer: 59 | Admitting: Behavioral Health

## 2022-08-03 ENCOUNTER — Encounter: Payer: Self-pay | Admitting: Behavioral Health

## 2022-08-03 DIAGNOSIS — F4321 Adjustment disorder with depressed mood: Secondary | ICD-10-CM
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
## 2022-08-15 ENCOUNTER — Other Ambulatory Visit: Payer: Self-pay

## 2022-08-15 MED ORDER — FUROSEMIDE 20 MG PO TABS: 20.0000 mg | ORAL_TABLET | ORAL | 3 refills | Status: DC

## 2022-08-30 ENCOUNTER — Ambulatory Visit: Payer: 59 | Admitting: Behavioral Health

## 2022-08-30 ENCOUNTER — Encounter: Payer: Self-pay | Admitting: Behavioral Health

## 2022-08-30 DIAGNOSIS — F4321 Adjustment disorder with depressed mood: Secondary | ICD-10-CM

## 2022-08-30 DIAGNOSIS — F4381 Prolonged grief disorder: Secondary | ICD-10-CM | POA: Diagnosis not present
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
## 2022-09-07 ENCOUNTER — Other Ambulatory Visit: Payer: Self-pay

## 2022-09-07 ENCOUNTER — Ambulatory Visit: Payer: 59 | Attending: Neurological Surgery

## 2022-09-07 DIAGNOSIS — R29898 Other symptoms and signs involving the musculoskeletal system: Secondary | ICD-10-CM | POA: Insufficient documentation

## 2022-09-07 DIAGNOSIS — R2681 Unsteadiness on feet: Secondary | ICD-10-CM | POA: Insufficient documentation

## 2022-09-07 DIAGNOSIS — M5416 Radiculopathy, lumbar region: Secondary | ICD-10-CM | POA: Insufficient documentation

## 2022-09-07 NOTE — Therapy (Signed)
OUTPATIENT PHYSICAL THERAPY THORACOLUMBAR EVALUATION   Patient Name: Kiara [name] MRN: [id number] DOB:01/18/1961, 62 y.o., female Today's Date: 09/07/2022  END OF SESSION:  PT End of Session - 09/07/22 1102     Visit Number 1    Date for PT Re-Evaluation 11/30/22    Progress Note Due on Visit 10    PT Start Time 1102    PT Stop Time 1145    PT Time Calculation (min) 43 min             Past Medical History:  Diagnosis Date   Atrial fibrillation    a. admx with AFib with RVR and acute systolic CHF 07/2013; TEE with LAA clot and no DCCV done;     Bilateral bunions 12/27/2020   Capsulitis of metatarsophalangeal (MTP) joint of right foot 01/31/2021   Chronic systolic heart failure    Coronary artery disease    a.  LHC (07/17/13):  LM 20%, ostial CFX 20-30%   Degenerative disc disease, lumbar 10/30/2017   Degenerative scoliosis 10/30/2017   Essential hypertension 09/10/2020   Fatigue 04/13/2014   Generalized anxiety disorder 09/22/2019   History of asthma 07/21/2013   History of cervical dysplasia 12/22/2021   History of kidney stones 2013   History of lumbar spinal fusion 01/16/2019   Hypercholesterolemia 09/22/2019   Hyperlipemia    Hypothyroidism    past hx   Leg weakness, bilateral 11/28/2017   Long term (current) use of anticoagulants 10/30/2017   Loss of bladder control 11/28/2017   Lumbar radiculopathy 11/28/2017   Major depressive disorder 09/22/2019   Moderate mitral regurgitation 07/17/2013   NICM (nonischemic cardiomyopathy)    a. Echo (07/17/13):  EF 25-30%, EF subsequently normalized with sinus rhythm (tachycardia mediated CM)   Osteopenia    Overweight 07/18/2019   Sinus bradycardia 08/28/2013   Spondylolisthesis at L4-L5 level 10/30/2017   Past Surgical History:  Procedure Laterality Date   ABDOMINAL HYSTERECTOMY  ~ 2001   ABLATION  10/28/13   PVI by Dr Johney Frame   APPENDECTOMY  1978   ATRIAL FIBRILLATION ABLATION N/A 10/28/2013   Procedure: ATRIAL  FIBRILLATION ABLATION;  Surgeon: Gardiner Rhyme, MD;  Location: [facility] CATH LAB;  Service: Cardiovascular;  Laterality: N/A;   ATRIAL FIBRILLATION ABLATION N/A 11/23/2020   Procedure: ATRIAL FIBRILLATION ABLATION;  Surgeon: Hillis Range, MD;  Location: [facility] INVASIVE CV LAB;  Service: Cardiovascular;  Laterality: N/A;   BACK SURGERY Bilateral 2019   Fusion   CARDIAC CATHETERIZATION  07/2013   CARDIOVERSION N/A 08/18/2013   Procedure: CARDIOVERSION;  Surgeon: Donato Schultz, MD;  Location: [facility] ENDOSCOPY;  Service: Cardiovascular;  Laterality: N/A;   CARDIOVERSION N/A 08/29/2013   Procedure: CARDIOVERSION;  Surgeon: Pricilla Riffle, MD;  Location: [facility] OR;  Service: Cardiovascular;  Laterality: N/A;   ELECTROPHYSIOLOGIC STUDY N/A 11/05/2014   Procedure: Atrial Fibrillation Ablation;  Surgeon: Hillis Range, MD;  Location: [facility] INVASIVE CV LAB;  Service: Cardiovascular;  Laterality: N/A;   EYE SURGERY Bilateral 1999   Corrective laser surgery   implantable loop recorder removal  10/02/2018   MDT Reveal LINQ removed in office by Dr Johney Frame for EOL battery   IR SACROPLASTY BILATERAL  02/06/2019   LEFT HEART CATHETERIZATION WITH CORONARY ANGIOGRAM N/A 07/17/2013   Procedure: LEFT HEART CATHETERIZATION WITH CORONARY ANGIOGRAM;  Surgeon: Micheline Chapman, MD;  Location: [facility] CATH LAB;  Service: Cardiovascular;  Laterality: N/A;   LOOP RECORDER IMPLANT N/A 06/05/2014   Procedure: LOOP RECORDER IMPLANT;  Surgeon: Hillis Range, MD;  Location: [facility] CATH LAB;  Service: Cardiovascular;  Laterality: N/A;   TEE WITHOUT CARDIOVERSION N/A 07/18/2013   Procedure: TRANSESOPHAGEAL ECHOCARDIOGRAM (TEE);  Surgeon: Lewayne Bunting, MD;  Location: [facility] ENDOSCOPY;  Service: Cardiovascular;  Laterality: N/A;   TEE WITHOUT CARDIOVERSION N/A 08/18/2013   Procedure: TRANSESOPHAGEAL ECHOCARDIOGRAM (TEE);  Surgeon: Donato Schultz, MD;  Location: [facility] ENDOSCOPY;  Service: Cardiovascular;  Laterality: N/A;   TEE WITHOUT CARDIOVERSION N/A 10/27/2013   Procedure:  TRANSESOPHAGEAL ECHOCARDIOGRAM (TEE);  Surgeon: Pricilla Riffle, MD;  Location: [facility] ENDOSCOPY;  Service: Cardiovascular;  Laterality: N/A;   TEE WITHOUT CARDIOVERSION N/A 11/04/2014   Procedure: TRANSESOPHAGEAL ECHOCARDIOGRAM (TEE);  Surgeon: Chrystie Nose, MD;  Location: [facility] ENDOSCOPY;  Service: Cardiovascular;  Laterality: N/A;   Patient Active Problem List   Diagnosis Date Noted   History of cervical dysplasia    Capsulitis of metatarsophalangeal (MTP) joint of right foot 01/31/2021   Essential hypertension 09/10/2020   Generalized anxiety disorder 09/22/2019   Hypercholesterolemia 09/22/2019   Major depressive disorder 09/22/2019   Overweight 07/18/2019   History of lumbar spinal fusion 01/16/2019   Leg weakness, bilateral 11/28/2017   Loss of bladder control 11/28/2017   Lumbar radiculopathy 11/28/2017   Degenerative disc disease, lumbar 10/30/2017   Degenerative scoliosis 10/30/2017   Long term (current) use of anticoagulants 10/30/2017   NICM (nonischemic cardiomyopathy) 10/30/2017   Spondylolisthesis at L4-L5 level 10/30/2017   Fatigue 04/13/2014   Sinus bradycardia 08/28/2013   Coronary artery disease    Chronic systolic heart failure    Atrial fibrillation    History of asthma 07/21/2013   Moderate mitral regurgitation 07/17/2013    PCP: [name], MD  REFERRING PROVIDER: Tia Alert, MD  REFERRING DIAG: L lumbar radiculopathy  Rationale for Evaluation and Treatment: Rehabilitation  THERAPY DIAG:  Left lumbar radiculopathy  Unsteady gait when walking  Weakness of both hips  ONSET DATE: 3 weeks  SUBJECTIVE:                                                                                                                                                                                           SUBJECTIVE STATEMENT: My balance has been really off for a long time so I have been wanting to get PT , this pain in my L leg has been there for about 3 weeks.  It is a  similar pain to the pain prior to my other lumbar fusion surgeries  PERTINENT HISTORY:  H/o previous L 2/3 , 3/4, 4/5 fusions  PAIN:  Are you having pain? Yes: NPRS scale: 2-3/10 Pain location: L lower back and lateral thigh, just below  L knee Pain description: pain with positioning, prolonged sitting Aggravating factors: sitting Relieving factors: resting  PRECAUTIONS: None  RED FLAGS: None   WEIGHT BEARING RESTRICTIONS: No  FALLS:  Has patient fallen in last 6 months? Yes. Number of falls 2  LIVING ENVIRONMENT: Lives with: lives with their spouse Lives in: House/apartment  OCCUPATION: disabled  PLOF: Independent and Needs assistance with homemaking  PATIENT GOALS: to get my balance better and be able to walk and keep up with my husband and daughter  NEXT MD VISIT: August 14  OBJECTIVE:   DIAGNOSTIC FINDINGS:  None performed recently  PATIENT SURVEYS:  Modified Oswestry 25/50   SCREENING FOR RED FLAGS: Bowel or bladder incontinence: No Spinal tumors: No Cauda equina syndrome: No Compression fracture: No Abdominal aneurysm: No  COGNITION: Overall cognitive status: Within functional limits for tasks assessed     SENSATION: WFL  MUSCLE LENGTH: Prone knee bend with restricted B to 90 degrees knee flexion POSTURE:  L lateral pelvic shift.  Elevated L iliac crest 1 cm, flattened LS spine   PALPATION: Point tender along L iliac crest at origin of gluteal musculature  LUMBAR ROM: NT in standing due to balance deficits, fall risk   LOWER EXTREMITY ROM:     AAROM  Right eval Left eval  Hip flexion 90 90  Hip extension -10 -10  Hip abduction    Hip adduction    Hip internal rotation    Hip external rotation    Knee flexion    Knee extension    Ankle dorsiflexion    Ankle plantarflexion    Ankle inversion    Ankle eversion     (Blank rows = wfl)  LOWER EXTREMITY MMT:    MMT Right eval Left eval  Hip flexion    Hip extension lock bridge   4' off mat 1" off mat  Hip abduction 4 3+  Hip adduction    Hip internal rotation    Hip external rotation 4 3+  Knee flexion    Knee extension    Ankle dorsiflexion 3+ 3+  Ankle plantarflexion 3+ 3+  Ankle inversion    Ankle eversion     (Blank rows = not tested)  LUMBAR SPECIAL TESTS:  Straight leg raise test: Positiveon L at 40 degrees for lateral L hip pain  FUNCTIONAL TESTS:  30 seconds chair stand test 8 3 position standing: feet together over one min safely, tandem 30 sec with sway and close SBA needed, unilateral standing 3 sec each  GAIT: Distance walked: walked within clinic 4' at a time.  No device, did note that she weaved frequently, lost her balance when walking to check out and had to brace on wall to stabilize.   TODAY'S TREATMENT:                                                                                                                              DATE: 09/07/22: Therex:  Inst in heel toe rocks , next to sink at home, at intervals throughout the day, to improve righting reactions, balance Inst in seated theraband hip abd/ER , black, to perform 15 x 2 -3 x day for motor control /strength B hip abductors    PATIENT EDUCATION:  Education details: POC, goals Person educated: Patient Education method: Programmer, multimedia, Demonstration, Tactile cues, and Verbal cues Education comprehension: verbalized understanding, returned demonstration, and verbal cues required  HOME EXERCISE PROGRAM: See above  ASSESSMENT:  CLINICAL IMPRESSION: Patient is a 63 y.o. female who was seen today for physical therapy evaluation and treatment for L lumbar radiculopathy.  She also has experienced 2 falls in the last 6 months and reports concern regarding her balance.  Today noted decreased strength B hip abductors, L hip ER, B ankle PF and DF, also stiff B hips, primarily for flexion.  She is point tender L gluteals as well.  Had balance loss, sway, weaving with her gait and with  standing balance tests.  Should benefit from skilled PT to address her deficits, improve her overall pain, strength and balance.  OBJECTIVE IMPAIRMENTS: decreased activity tolerance, decreased balance, decreased coordination, decreased endurance, difficulty walking, decreased ROM, decreased strength, postural dysfunction, and pain.   ACTIVITY LIMITATIONS: carrying, lifting, bending, squatting, sleeping, transfers, and locomotion level  PARTICIPATION LIMITATIONS: cleaning, laundry, shopping, community activity, and yard work  PERSONAL FACTORS: Behavior pattern, Fitness, Past/current experiences, Time since onset of injury/illness/exacerbation, and 1-2 comorbidities: h/o afib, h/o LS fusion, osteopenia, h/o B foot fractures  are also affecting patient's functional outcome.   REHAB POTENTIAL: Good  CLINICAL DECISION MAKING: Evolving/moderate complexity  EVALUATION COMPLEXITY: Moderate   GOALS: Goals reviewed with patient? Yes  SHORT TERM GOALS: Target date: 2 weeks 09/21/22  I HEP Baseline: Goal status: INITIAL  LONG TERM GOALS: Target date: 11/30/22  Improve 30 sec sit to stand from 8 reps to 12 reps  Baseline:  Goal status: INITIAL  2.  Improve balance, DGI 22/24 Baseline: TBD not assessed at initial eval Goal status: INITIAL  3.  Reduce L radicular pain to 50% less frequent and less intensity Baseline: constant Goal status: INITIAL  4.  Improve B hip flexion ROM to 110 each for improved sitting tolerance, improved mobility sit to stand Baseline: 90 degrees B Goal status: INITIAL  5.  Improve hip abd, ext  strength and plantar flexor strength to 4/5 Baseline: 3+/5 to 4-/5 Goal status: INITIAL  PLAN:  PT FREQUENCY: 2x/week  PT DURATION: 12 weeks  PLANNED INTERVENTIONS: Therapeutic exercises, Therapeutic activity, Neuromuscular re-education, Balance training, Gait training, Patient/Family education, Self Care, and Joint mobilization.  PLAN FOR NEXT SESSION: please  assess DGI and include in long term goal list, possibly dry needling or myofacial release L gluteals, initiate cardiovascular training on recumbent machines, manual stretching B hips, additional therex to address strength and balance LE's and B hip flexibility.   Jamire Shabazz Frazier Richards, PT DPT [facility] 09/07/2022, 1:02 PM

## 2022-09-12 ENCOUNTER — Encounter: Payer: 59 | Admitting: Physical Therapy

## 2022-09-13 ENCOUNTER — Encounter: Payer: Self-pay | Admitting: Behavioral Health

## 2022-09-13 ENCOUNTER — Ambulatory Visit: Payer: 59 | Admitting: Behavioral Health

## 2022-09-13 DIAGNOSIS — F4381 Prolonged grief disorder: Secondary | ICD-10-CM

## 2022-09-13 DIAGNOSIS — F4321 Adjustment disorder with depressed mood: Secondary | ICD-10-CM
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
## 2022-09-20 ENCOUNTER — Ambulatory Visit: Payer: 59 | Admitting: Physical Therapy

## 2022-09-26 ENCOUNTER — Other Ambulatory Visit: Payer: Self-pay

## 2022-09-26 ENCOUNTER — Ambulatory Visit: Payer: 59 | Attending: Neurological Surgery

## 2022-09-26 DIAGNOSIS — R2681 Unsteadiness on feet: Secondary | ICD-10-CM | POA: Diagnosis present

## 2022-09-26 DIAGNOSIS — M5416 Radiculopathy, lumbar region: Secondary | ICD-10-CM | POA: Diagnosis present

## 2022-09-26 DIAGNOSIS — R29898 Other symptoms and signs involving the musculoskeletal system: Secondary | ICD-10-CM | POA: Diagnosis present

## 2022-09-26 NOTE — Therapy (Signed)
OUTPATIENT PHYSICAL THERAPY THORACOLUMBAR EVALUATION   Patient Name: Kiara [name] MRN: [id number] DOB:1960-04-21, 62 y.o., female Today's Date: 09/26/2022  END OF SESSION:    Past Medical History:  Diagnosis Date   Atrial fibrillation    a. admx with AFib with RVR and acute systolic CHF 07/2013; TEE with LAA clot and no DCCV done;     Bilateral bunions 12/27/2020   Capsulitis of metatarsophalangeal (MTP) joint of right foot 01/31/2021   Chronic systolic heart failure    Coronary artery disease    a.  LHC (07/17/13):  LM 20%, ostial CFX 20-30%   Degenerative disc disease, lumbar 10/30/2017   Degenerative scoliosis 10/30/2017   Essential hypertension 09/10/2020   Fatigue 04/13/2014   Generalized anxiety disorder 09/22/2019   History of asthma 07/21/2013   History of cervical dysplasia 12/22/2021   History of kidney stones 2013   History of lumbar spinal fusion 01/16/2019   Hypercholesterolemia 09/22/2019   Hyperlipemia    Hypothyroidism    past hx   Leg weakness, bilateral 11/28/2017   Long term (current) use of anticoagulants 10/30/2017   Loss of bladder control 11/28/2017   Lumbar radiculopathy 11/28/2017   Major depressive disorder 09/22/2019   Moderate mitral regurgitation 07/17/2013   NICM (nonischemic cardiomyopathy)    a. Echo (07/17/13):  EF 25-30%, EF subsequently normalized with sinus rhythm (tachycardia mediated CM)   Osteopenia    Overweight 07/18/2019   Sinus bradycardia 08/28/2013   Spondylolisthesis at L4-L5 level 10/30/2017   Past Surgical History:  Procedure Laterality Date   ABDOMINAL HYSTERECTOMY  ~ 2001   ABLATION  10/28/13   PVI by Dr Johney Frame   APPENDECTOMY  1978   ATRIAL FIBRILLATION ABLATION N/A 10/28/2013   Procedure: ATRIAL FIBRILLATION ABLATION;  Surgeon: Gardiner Rhyme, MD;  Location: [facility] CATH LAB;  Service: Cardiovascular;  Laterality: N/A;   ATRIAL FIBRILLATION ABLATION N/A 11/23/2020   Procedure: ATRIAL FIBRILLATION ABLATION;  Surgeon:  Hillis Range, MD;  Location: [facility] INVASIVE CV LAB;  Service: Cardiovascular;  Laterality: N/A;   BACK SURGERY Bilateral 2019   Fusion   CARDIAC CATHETERIZATION  07/2013   CARDIOVERSION N/A 08/18/2013   Procedure: CARDIOVERSION;  Surgeon: Donato Schultz, MD;  Location: [facility] ENDOSCOPY;  Service: Cardiovascular;  Laterality: N/A;   CARDIOVERSION N/A 08/29/2013   Procedure: CARDIOVERSION;  Surgeon: Pricilla Riffle, MD;  Location: [facility] OR;  Service: Cardiovascular;  Laterality: N/A;   ELECTROPHYSIOLOGIC STUDY N/A 11/05/2014   Procedure: Atrial Fibrillation Ablation;  Surgeon: Hillis Range, MD;  Location: [facility] INVASIVE CV LAB;  Service: Cardiovascular;  Laterality: N/A;   EYE SURGERY Bilateral 1999   Corrective laser surgery   implantable loop recorder removal  10/02/2018   MDT Reveal LINQ removed in office by Dr Johney Frame for EOL battery   IR SACROPLASTY BILATERAL  02/06/2019   LEFT HEART CATHETERIZATION WITH CORONARY ANGIOGRAM N/A 07/17/2013   Procedure: LEFT HEART CATHETERIZATION WITH CORONARY ANGIOGRAM;  Surgeon: Micheline Chapman, MD;  Location: [facility] CATH LAB;  Service: Cardiovascular;  Laterality: N/A;   LOOP RECORDER IMPLANT N/A 06/05/2014   Procedure: LOOP RECORDER IMPLANT;  Surgeon: Hillis Range, MD;  Location: [facility] CATH LAB;  Service: Cardiovascular;  Laterality: N/A;   TEE WITHOUT CARDIOVERSION N/A 07/18/2013   Procedure: TRANSESOPHAGEAL ECHOCARDIOGRAM (TEE);  Surgeon: Lewayne Bunting, MD;  Location: [facility] ENDOSCOPY;  Service: Cardiovascular;  Laterality: N/A;   TEE WITHOUT CARDIOVERSION N/A 08/18/2013   Procedure: TRANSESOPHAGEAL ECHOCARDIOGRAM (TEE);  Surgeon: Donato Schultz, MD;  Location: [facility] ENDOSCOPY;  Service:  Cardiovascular;  Laterality: N/A;   TEE WITHOUT CARDIOVERSION N/A 10/27/2013   Procedure: TRANSESOPHAGEAL ECHOCARDIOGRAM (TEE);  Surgeon: Pricilla Riffle, MD;  Location: [facility] ENDOSCOPY;  Service: Cardiovascular;  Laterality: N/A;   TEE WITHOUT CARDIOVERSION N/A 11/04/2014   Procedure: TRANSESOPHAGEAL ECHOCARDIOGRAM  (TEE);  Surgeon: Chrystie Nose, MD;  Location: [facility] ENDOSCOPY;  Service: Cardiovascular;  Laterality: N/A;   Patient Active Problem List   Diagnosis Date Noted   History of cervical dysplasia    Capsulitis of metatarsophalangeal (MTP) joint of right foot 01/31/2021   Essential hypertension 09/10/2020   Generalized anxiety disorder 09/22/2019   Hypercholesterolemia 09/22/2019   Major depressive disorder 09/22/2019   Overweight 07/18/2019   History of lumbar spinal fusion 01/16/2019   Leg weakness, bilateral 11/28/2017   Loss of bladder control 11/28/2017   Lumbar radiculopathy 11/28/2017   Degenerative disc disease, lumbar 10/30/2017   Degenerative scoliosis 10/30/2017   Long term (current) use of anticoagulants 10/30/2017   NICM (nonischemic cardiomyopathy) 10/30/2017   Spondylolisthesis at L4-L5 level 10/30/2017   Fatigue 04/13/2014   Sinus bradycardia 08/28/2013   Coronary artery disease    Chronic systolic heart failure    Atrial fibrillation    History of asthma 07/21/2013   Moderate mitral regurgitation 07/17/2013    PCP: [name], MD  REFERRING PROVIDER: Tia Alert, MD  REFERRING DIAG: L lumbar radiculopathy  Rationale for Evaluation and Treatment: Rehabilitation  THERAPY DIAG:  No diagnosis found.  ONSET DATE: 3 weeks  SUBJECTIVE:                                                                                                                                                                                           SUBJECTIVE STATEMENT: 09/26/22:  I had to cancel the lat 2 weeks due to my daughter was sick and then I was sick .  Eval: My balance has been really off for a long time so I have been wanting to get PT , this pain in my L leg has been there for about 3 weeks.  It is a similar pain to the pain prior to my other lumbar fusion surgeries  PERTINENT HISTORY:  H/o previous L 2/3 , 3/4, 4/5 fusions  PAIN:  Are you having pain? Yes: NPRS scale:  2-3/10 Pain location: L lower back and lateral thigh, just below L knee Pain description: pain with positioning, prolonged sitting Aggravating factors: sitting Relieving factors: resting  PRECAUTIONS: None  RED FLAGS: None   WEIGHT BEARING RESTRICTIONS: No  FALLS:  Has patient fallen in last 6 months? Yes. Number of falls 2  LIVING ENVIRONMENT: Lives with: lives  with their spouse Lives in: House/apartment  OCCUPATION: disabled  PLOF: Independent and Needs assistance with homemaking  PATIENT GOALS: to get my balance better and be able to walk and keep up with my husband and daughter  NEXT MD VISIT: August 14  OBJECTIVE:   DIAGNOSTIC FINDINGS:  None performed recently  PATIENT SURVEYS:  Modified Oswestry 25/50   SCREENING FOR RED FLAGS: Bowel or bladder incontinence: No Spinal tumors: No Cauda equina syndrome: No Compression fracture: No Abdominal aneurysm: No  COGNITION: Overall cognitive status: Within functional limits for tasks assessed     SENSATION: WFL  MUSCLE LENGTH: Prone knee bend with restricted B to 90 degrees knee flexion POSTURE:  L lateral pelvic shift.  Elevated L iliac crest 1 cm, flattened LS spine   PALPATION: Point tender along L iliac crest at origin of gluteal musculature  LUMBAR ROM: NT in standing due to balance deficits, fall risk   LOWER EXTREMITY ROM:     AAROM  Right eval Left eval  Hip flexion 90 90  Hip extension -10 -10  Hip abduction    Hip adduction    Hip internal rotation    Hip external rotation    Knee flexion    Knee extension    Ankle dorsiflexion    Ankle plantarflexion    Ankle inversion    Ankle eversion     (Blank rows = wfl)  LOWER EXTREMITY MMT:    MMT Right eval Left eval  Hip flexion    Hip extension lock bridge  4' off mat 1" off mat  Hip abduction 4 3+  Hip adduction    Hip internal rotation    Hip external rotation 4 3+  Knee flexion    Knee extension    Ankle dorsiflexion 3+ 3+   Ankle plantarflexion 3+ 3+  Ankle inversion    Ankle eversion     (Blank rows = not tested)  LUMBAR SPECIAL TESTS:  Straight leg raise test: Positiveon L at 40 degrees for lateral L hip pain  FUNCTIONAL TESTS:  30 seconds chair stand test 8 3 position standing: feet together over one min safely, tandem 30 sec with sway and close SBA needed, unilateral standing 3 sec each  GAIT: Distance walked: walked within clinic 44' at a time.  No device, did note that she weaved frequently, lost her balance when walking to check out and had to brace on wall to stabilize.   TODAY'S TREATMENT:                                                                                                                              DATE:  09/26/22: Neuromuscular reed:  addressed balance deficits:  Completed DGI Added standing in corner , semi tandem standing, with head turns  Reviewed heel toe rocks, patient needs counter to maintain balance  Therex: instructed in the following ex to build her strength, stability, also motor control, coordination, focused primarily on  hip musculture   Standing for penguins with green t band around thighs  Standing for forward backward rocks with green t band around thighs  BATCA B knee extension 15 reps, 2 sets  BATCA B knee flexion 15 reps, 2 sets  Nustep:  level 5, Ue's and LE's 09/07/22: Therex:  Inst in heel toe rocks , next to sink at home, at intervals throughout the day, to improve righting reactions, balance Inst in seated theraband hip abd/ER , black, to perform 15 x 2 -3 x day for motor control /strength B hip abductors    PATIENT EDUCATION:  Education details: POC, goals Person educated: Patient Education method: Programmer, multimedia, Demonstration, Tactile cues, and Verbal cues Education comprehension: verbalized understanding, returned demonstration, and verbal cues required  HOME EXERCISE PROGRAM: See above  ASSESSMENT:  CLINICAL IMPRESSION: Patient is a 62  y.o. female who participated in physical therapy treatment today for L lumbar radiculopathy.  Today was her second PT visit after her evaluation, she has missed a couple of weeks due to illness.assessed DGI today at 14/24.  She demonstrated the most difficulty with head turns and pivot turn so added this component to her home program.  She did not think she needed modalities or dry needling today for pain. She tolerated session well, hopefully will be able to consistently attend 2 times a week for several weeks to build some momentum.    OBJECTIVE IMPAIRMENTS: decreased activity tolerance, decreased balance, decreased coordination, decreased endurance, difficulty walking, decreased ROM, decreased strength, postural dysfunction, and pain.   ACTIVITY LIMITATIONS: carrying, lifting, bending, squatting, sleeping, transfers, and locomotion level  PARTICIPATION LIMITATIONS: cleaning, laundry, shopping, community activity, and yard work  PERSONAL FACTORS: Behavior pattern, Fitness, Past/current experiences, Time since onset of injury/illness/exacerbation, and 1-2 comorbidities: h/o afib, h/o LS fusion, osteopenia, h/o B foot fractures  are also affecting patient's functional outcome.   REHAB POTENTIAL: Good  CLINICAL DECISION MAKING: Evolving/moderate complexity  EVALUATION COMPLEXITY: Moderate   GOALS: Goals reviewed with patient? Yes  SHORT TERM GOALS: Target date: 2 weeks 09/21/22  I HEP Baseline: Goal status: INITIAL  LONG TERM GOALS: Target date: 11/30/22  Improve 30 sec sit to stand from 8 reps to 12 reps  Baseline:  Goal status: INITIAL  2.  Improve balance, DGI 22/24 Baseline: TBD not assessed at initial eval 09/26/22: 14/24 Goal status: INITIAL  3.  Reduce L radicular pain to 50% less frequent and less intensity Baseline: constant Goal status: INITIAL  4.  Improve B hip flexion ROM to 110 each for improved sitting tolerance, improved mobility sit to stand Baseline: 90 degrees  B Goal status: INITIAL  5.  Improve hip abd, ext  strength and plantar flexor strength to 4/5 Baseline: 3+/5 to 4-/5 Goal status: INITIAL  PLAN:  PT FREQUENCY: 2x/week  PT DURATION: 12 weeks  PLANNED INTERVENTIONS: Therapeutic exercises, Therapeutic activity, Neuromuscular re-education, Balance training, Gait training, Patient/Family education, Self Care, and Joint mobilization.  PLAN FOR NEXT SESSION:  cardiovascular training on recumbent machines, manual stretching B hips, additional therex to address strength and balance LE's and B hip flexibility.    Frazier Richards, PT DPT O[facility] 09/26/2022, 9:44 AM

## 2022-09-27 ENCOUNTER — Ambulatory Visit: Payer: 59 | Admitting: Behavioral Health

## 2022-09-27 ENCOUNTER — Encounter: Payer: Self-pay | Admitting: Behavioral Health

## 2022-09-27 DIAGNOSIS — F4381 Prolonged grief disorder: Secondary | ICD-10-CM | POA: Diagnosis not present

## 2022-09-27 DIAGNOSIS — F4321 Adjustment disorder with depressed mood: Secondary | ICD-10-CM
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
## 2022-09-29 ENCOUNTER — Ambulatory Visit: Payer: 59

## 2022-09-29 DIAGNOSIS — R29898 Other symptoms and signs involving the musculoskeletal system: Secondary | ICD-10-CM

## 2022-09-29 DIAGNOSIS — R2681 Unsteadiness on feet: Secondary | ICD-10-CM

## 2022-09-29 DIAGNOSIS — M5416 Radiculopathy, lumbar region: Secondary | ICD-10-CM | POA: Diagnosis not present

## 2022-09-29 NOTE — Therapy (Signed)
OUTPATIENT PHYSICAL THERAPY TREATMENT   Patient Name: Kiara [name] MRN: [id number] DOB:08/27/1960, 62 y.o., female Today's Date: 09/29/2022  END OF SESSION:  PT End of Session - 09/29/22 1054     Visit Number 3    Date for PT Re-Evaluation 11/30/22    PT Start Time 1019    PT Stop Time 1100    PT Time Calculation (min) 41 min    Activity Tolerance Patient tolerated treatment well    Behavior During Therapy [facility] for tasks assessed/performed              Past Medical History:  Diagnosis Date   Atrial fibrillation    a. admx with AFib with RVR and acute systolic CHF 07/2013; TEE with LAA clot and no DCCV done;     Bilateral bunions 12/27/2020   Capsulitis of metatarsophalangeal (MTP) joint of right foot 01/31/2021   Chronic systolic heart failure    Coronary artery disease    a.  LHC (07/17/13):  LM 20%, ostial CFX 20-30%   Degenerative disc disease, lumbar 10/30/2017   Degenerative scoliosis 10/30/2017   Essential hypertension 09/10/2020   Fatigue 04/13/2014   Generalized anxiety disorder 09/22/2019   History of asthma 07/21/2013   History of cervical dysplasia 12/22/2021   History of kidney stones 2013   History of lumbar spinal fusion 01/16/2019   Hypercholesterolemia 09/22/2019   Hyperlipemia    Hypothyroidism    past hx   Leg weakness, bilateral 11/28/2017   Long term (current) use of anticoagulants 10/30/2017   Loss of bladder control 11/28/2017   Lumbar radiculopathy 11/28/2017   Major depressive disorder 09/22/2019   Moderate mitral regurgitation 07/17/2013   NICM (nonischemic cardiomyopathy)    a. Echo (07/17/13):  EF 25-30%, EF subsequently normalized with sinus rhythm (tachycardia mediated CM)   Osteopenia    Overweight 07/18/2019   Sinus bradycardia 08/28/2013   Spondylolisthesis at L4-L5 level 10/30/2017   Past Surgical History:  Procedure Laterality Date   ABDOMINAL HYSTERECTOMY  ~ 2001   ABLATION  10/28/13   PVI by Dr Johney Frame   APPENDECTOMY  1978    ATRIAL FIBRILLATION ABLATION N/A 10/28/2013   Procedure: ATRIAL FIBRILLATION ABLATION;  Surgeon: Gardiner Rhyme, MD;  Location: [facility] CATH LAB;  Service: Cardiovascular;  Laterality: N/A;   ATRIAL FIBRILLATION ABLATION N/A 11/23/2020   Procedure: ATRIAL FIBRILLATION ABLATION;  Surgeon: Hillis Range, MD;  Location: [facility] INVASIVE CV LAB;  Service: Cardiovascular;  Laterality: N/A;   BACK SURGERY Bilateral 2019   Fusion   CARDIAC CATHETERIZATION  07/2013   CARDIOVERSION N/A 08/18/2013   Procedure: CARDIOVERSION;  Surgeon: Donato Schultz, MD;  Location: [facility] ENDOSCOPY;  Service: Cardiovascular;  Laterality: N/A;   CARDIOVERSION N/A 08/29/2013   Procedure: CARDIOVERSION;  Surgeon: Pricilla Riffle, MD;  Location: [facility] OR;  Service: Cardiovascular;  Laterality: N/A;   ELECTROPHYSIOLOGIC STUDY N/A 11/05/2014   Procedure: Atrial Fibrillation Ablation;  Surgeon: Hillis Range, MD;  Location: [facility] INVASIVE CV LAB;  Service: Cardiovascular;  Laterality: N/A;   EYE SURGERY Bilateral 1999   Corrective laser surgery   implantable loop recorder removal  10/02/2018   MDT Reveal LINQ removed in office by Dr Johney Frame for EOL battery   IR SACROPLASTY BILATERAL  02/06/2019   LEFT HEART CATHETERIZATION WITH CORONARY ANGIOGRAM N/A 07/17/2013   Procedure: LEFT HEART CATHETERIZATION WITH CORONARY ANGIOGRAM;  Surgeon: Micheline Chapman, MD;  Location: [facility] CATH LAB;  Service: Cardiovascular;  Laterality: N/A;   LOOP RECORDER IMPLANT N/A 06/05/2014  Procedure: LOOP RECORDER IMPLANT;  Surgeon: Hillis Range, MD;  Location: [facility] CATH LAB;  Service: Cardiovascular;  Laterality: N/A;   TEE WITHOUT CARDIOVERSION N/A 07/18/2013   Procedure: TRANSESOPHAGEAL ECHOCARDIOGRAM (TEE);  Surgeon: Lewayne Bunting, MD;  Location: [facility] ENDOSCOPY;  Service: Cardiovascular;  Laterality: N/A;   TEE WITHOUT CARDIOVERSION N/A 08/18/2013   Procedure: TRANSESOPHAGEAL ECHOCARDIOGRAM (TEE);  Surgeon: Donato Schultz, MD;  Location: [facility] ENDOSCOPY;  Service: Cardiovascular;   Laterality: N/A;   TEE WITHOUT CARDIOVERSION N/A 10/27/2013   Procedure: TRANSESOPHAGEAL ECHOCARDIOGRAM (TEE);  Surgeon: Pricilla Riffle, MD;  Location: [facility] ENDOSCOPY;  Service: Cardiovascular;  Laterality: N/A;   TEE WITHOUT CARDIOVERSION N/A 11/04/2014   Procedure: TRANSESOPHAGEAL ECHOCARDIOGRAM (TEE);  Surgeon: Chrystie Nose, MD;  Location: [facility] ENDOSCOPY;  Service: Cardiovascular;  Laterality: N/A;   Patient Active Problem List   Diagnosis Date Noted   History of cervical dysplasia    Capsulitis of metatarsophalangeal (MTP) joint of right foot 01/31/2021   Essential hypertension 09/10/2020   Generalized anxiety disorder 09/22/2019   Hypercholesterolemia 09/22/2019   Major depressive disorder 09/22/2019   Overweight 07/18/2019   History of lumbar spinal fusion 01/16/2019   Leg weakness, bilateral 11/28/2017   Loss of bladder control 11/28/2017   Lumbar radiculopathy 11/28/2017   Degenerative disc disease, lumbar 10/30/2017   Degenerative scoliosis 10/30/2017   Long term (current) use of anticoagulants 10/30/2017   NICM (nonischemic cardiomyopathy) 10/30/2017   Spondylolisthesis at L4-L5 level 10/30/2017   Fatigue 04/13/2014   Sinus bradycardia 08/28/2013   Coronary artery disease    Chronic systolic heart failure    Atrial fibrillation    History of asthma 07/21/2013   Moderate mitral regurgitation 07/17/2013    PCP: [name], MD  REFERRING PROVIDER: Tia Alert, MD  REFERRING DIAG: L lumbar radiculopathy  Rationale for Evaluation and Treatment: Rehabilitation  THERAPY DIAG:  Left lumbar radiculopathy  Unsteady gait when walking  Weakness of both hips  ONSET DATE: 3 weeks  SUBJECTIVE:                                                                                                                                                                                           SUBJECTIVE STATEMENT: 09/29/22: Doing good with exercises, rescheduled MD appointment   Eval:  My balance has been really off for a long time so I have been wanting to get PT , this pain in my L leg has been there for about 3 weeks.  It is a similar pain to the pain prior to my other lumbar fusion surgeries  PERTINENT HISTORY:  H/o previous L 2/3 , 3/4, 4/5 fusions  PAIN:  Are you having pain? Yes: NPRS scale: 5/10 Pain location: L lower back and lateral thigh, just below L knee Pain description: pain with positioning, prolonged sitting Aggravating factors: sitting Relieving factors: resting  PRECAUTIONS: None  RED FLAGS: None   WEIGHT BEARING RESTRICTIONS: No  FALLS:  Has patient fallen in last 6 months? Yes. Number of falls 2  LIVING ENVIRONMENT: Lives with: lives with their spouse Lives in: House/apartment  OCCUPATION: disabled  PLOF: Independent and Needs assistance with homemaking  PATIENT GOALS: to get my balance better and be able to walk and keep up with my husband and daughter  NEXT MD VISIT: 10/05/22  OBJECTIVE:   DIAGNOSTIC FINDINGS:  None performed recently  PATIENT SURVEYS:  Modified Oswestry 25/50   SCREENING FOR RED FLAGS: Bowel or bladder incontinence: No Spinal tumors: No Cauda equina syndrome: No Compression fracture: No Abdominal aneurysm: No  COGNITION: Overall cognitive status: Within functional limits for tasks assessed     SENSATION: WFL  MUSCLE LENGTH: Prone knee bend with restricted B to 90 degrees knee flexion POSTURE:  L lateral pelvic shift.  Elevated L iliac crest 1 cm, flattened LS spine   PALPATION: Point tender along L iliac crest at origin of gluteal musculature  LUMBAR ROM: NT in standing due to balance deficits, fall risk   LOWER EXTREMITY ROM:     AAROM  Right eval Left eval  Hip flexion 90 90  Hip extension -10 -10  Hip abduction    Hip adduction    Hip internal rotation    Hip external rotation    Knee flexion    Knee extension    Ankle dorsiflexion    Ankle plantarflexion    Ankle inversion     Ankle eversion     (Blank rows = wfl)  LOWER EXTREMITY MMT:    MMT Right eval Left eval  Hip flexion    Hip extension lock bridge  4' off mat 1" off mat  Hip abduction 4 3+  Hip adduction    Hip internal rotation    Hip external rotation 4 3+  Knee flexion    Knee extension    Ankle dorsiflexion 3+ 3+  Ankle plantarflexion 3+ 3+  Ankle inversion    Ankle eversion     (Blank rows = not tested)  LUMBAR SPECIAL TESTS:  Straight leg raise test: Positiveon L at 40 degrees for lateral L hip pain  FUNCTIONAL TESTS:  30 seconds chair stand test 8 3 position standing: feet together over one min safely, tandem 30 sec with sway and close SBA needed, unilateral standing 3 sec each  GAIT: Distance walked: walked within clinic 70' at a time.  No device, did note that she weaved frequently, lost her balance when walking to check out and had to brace on wall to stabilize.   TODAY'S TREATMENT:  DATE: 09/29/22 Nustep L5x85min  Standing for penguins with green t band around thighs x 10  Standing for forward backward rocks with green t band around thighs x 10  Semi tandem in corner, head turns with gaze stability in corner Knee flexion 20# 2x10 Knee extension 10# 2x10 Seated hip ER x 10   09/26/22: Neuromuscular reed:  addressed balance deficits:  Completed DGI Added standing in corner , semi tandem standing, with head turns  Reviewed heel toe rocks, patient needs counter to maintain balance  Therex: instructed in the following ex to build her strength, stability, also motor control, coordination, focused primarily on hip musculture   Standing for penguins with green t band around thighs  Standing for forward backward rocks with green t band around thighs  BATCA B knee extension 15 reps, 2 sets  BATCA B knee flexion 15 reps, 2 sets  Nustep:  level 5, Ue's and  LE's 09/07/22: Therex:  Inst in heel toe rocks , next to sink at home, at intervals throughout the day, to improve righting reactions, balance Inst in seated theraband hip abd/ER , black, to perform 15 x 2 -3 x day for motor control /strength B hip abductors    PATIENT EDUCATION:  Education details: POC, goals Person educated: Patient Education method: Programmer, multimedia, Demonstration, Tactile cues, and Verbal cues Education comprehension: verbalized understanding, returned demonstration, and verbal cues required  HOME EXERCISE PROGRAM: See above  ASSESSMENT:  CLINICAL IMPRESSION: Patient is a 62 y.o. female who participated in physical therapy treatment today for L lumbar radiculopathy.  Progressed exercises as tolerated. Provided cues as needed with exercises to correct form and technique. Reviewed of HEP needed to clarify pengiun walks and gaze stability exercises.    OBJECTIVE IMPAIRMENTS: decreased activity tolerance, decreased balance, decreased coordination, decreased endurance, difficulty walking, decreased ROM, decreased strength, postural dysfunction, and pain.   ACTIVITY LIMITATIONS: carrying, lifting, bending, squatting, sleeping, transfers, and locomotion level  PARTICIPATION LIMITATIONS: cleaning, laundry, shopping, community activity, and yard work  PERSONAL FACTORS: Behavior pattern, Fitness, Past/current experiences, Time since onset of injury/illness/exacerbation, and 1-2 comorbidities: h/o afib, h/o LS fusion, osteopenia, h/o B foot fractures  are also affecting patient's functional outcome.   REHAB POTENTIAL: Good  CLINICAL DECISION MAKING: Evolving/moderate complexity  EVALUATION COMPLEXITY: Moderate   GOALS: Goals reviewed with patient? Yes  SHORT TERM GOALS: Target date: 2 weeks 09/21/22  I HEP Baseline: Goal status: PROGRESSING  LONG TERM GOALS: Target date: 11/30/22  Improve 30 sec sit to stand from 8 reps to 12 reps  Baseline:  Goal status:  INITIAL  2.  Improve balance, DGI 22/24 Baseline: TBD not assessed at initial eval 09/26/22: 14/24 Goal status: INITIAL  3.  Reduce L radicular pain to 50% less frequent and less intensity Baseline: constant Goal status: INITIAL  4.  Improve B hip flexion ROM to 110 each for improved sitting tolerance, improved mobility sit to stand Baseline: 90 degrees B Goal status: INITIAL  5.  Improve hip abd, ext  strength and plantar flexor strength to 4/5 Baseline: 3+/5 to 4-/5 Goal status: INITIAL  PLAN:  PT FREQUENCY: 2x/week  PT DURATION: 12 weeks  PLANNED INTERVENTIONS: Therapeutic exercises, Therapeutic activity, Neuromuscular re-education, Balance training, Gait training, Patient/Family education, Self Care, and Joint mobilization.  PLAN FOR NEXT SESSION:  cardiovascular training on recumbent machines, manual stretching B hips, additional therex to address strength and balance LE's and B hip flexibility.   Darleene Cleaver, PTA 09/29/2022, 11:12 AM

## 2022-10-03 ENCOUNTER — Ambulatory Visit: Payer: 59

## 2022-10-03 DIAGNOSIS — R2681 Unsteadiness on feet: Secondary | ICD-10-CM

## 2022-10-03 DIAGNOSIS — M5416 Radiculopathy, lumbar region: Secondary | ICD-10-CM

## 2022-10-03 DIAGNOSIS — R29898 Other symptoms and signs involving the musculoskeletal system: Secondary | ICD-10-CM

## 2022-10-03 NOTE — Therapy (Signed)
OUTPATIENT PHYSICAL THERAPY TREATMENT   Patient Name: Kiara [name] MRN: [id number] DOB:01-31-1961, 62 y.o., female Today's Date: 10/03/2022  END OF SESSION:  PT End of Session - 10/03/22 0939     Visit Number 4    Date for PT Re-Evaluation 11/30/22    PT Start Time 0933    PT Stop Time 1015    PT Time Calculation (min) 42 min    Activity Tolerance Patient tolerated treatment well    Behavior During Therapy [facility] for tasks assessed/performed               Past Medical History:  Diagnosis Date   Atrial fibrillation    a. admx with AFib with RVR and acute systolic CHF 07/2013; TEE with LAA clot and no DCCV done;     Bilateral bunions 12/27/2020   Capsulitis of metatarsophalangeal (MTP) joint of right foot 01/31/2021   Chronic systolic heart failure    Coronary artery disease    a.  LHC (07/17/13):  LM 20%, ostial CFX 20-30%   Degenerative disc disease, lumbar 10/30/2017   Degenerative scoliosis 10/30/2017   Essential hypertension 09/10/2020   Fatigue 04/13/2014   Generalized anxiety disorder 09/22/2019   History of asthma 07/21/2013   History of cervical dysplasia 12/22/2021   History of kidney stones 2013   History of lumbar spinal fusion 01/16/2019   Hypercholesterolemia 09/22/2019   Hyperlipemia    Hypothyroidism    past hx   Leg weakness, bilateral 11/28/2017   Long term (current) use of anticoagulants 10/30/2017   Loss of bladder control 11/28/2017   Lumbar radiculopathy 11/28/2017   Major depressive disorder 09/22/2019   Moderate mitral regurgitation 07/17/2013   NICM (nonischemic cardiomyopathy)    a. Echo (07/17/13):  EF 25-30%, EF subsequently normalized with sinus rhythm (tachycardia mediated CM)   Osteopenia    Overweight 07/18/2019   Sinus bradycardia 08/28/2013   Spondylolisthesis at L4-L5 level 10/30/2017   Past Surgical History:  Procedure Laterality Date   ABDOMINAL HYSTERECTOMY  ~ 2001   ABLATION  10/28/13   PVI by Dr Johney Frame   APPENDECTOMY   1978   ATRIAL FIBRILLATION ABLATION N/A 10/28/2013   Procedure: ATRIAL FIBRILLATION ABLATION;  Surgeon: Gardiner Rhyme, MD;  Location: [facility] CATH LAB;  Service: Cardiovascular;  Laterality: N/A;   ATRIAL FIBRILLATION ABLATION N/A 11/23/2020   Procedure: ATRIAL FIBRILLATION ABLATION;  Surgeon: Hillis Range, MD;  Location: [facility] INVASIVE CV LAB;  Service: Cardiovascular;  Laterality: N/A;   BACK SURGERY Bilateral 2019   Fusion   CARDIAC CATHETERIZATION  07/2013   CARDIOVERSION N/A 08/18/2013   Procedure: CARDIOVERSION;  Surgeon: Donato Schultz, MD;  Location: [facility] ENDOSCOPY;  Service: Cardiovascular;  Laterality: N/A;   CARDIOVERSION N/A 08/29/2013   Procedure: CARDIOVERSION;  Surgeon: Pricilla Riffle, MD;  Location: [facility] OR;  Service: Cardiovascular;  Laterality: N/A;   ELECTROPHYSIOLOGIC STUDY N/A 11/05/2014   Procedure: Atrial Fibrillation Ablation;  Surgeon: Hillis Range, MD;  Location: [facility] INVASIVE CV LAB;  Service: Cardiovascular;  Laterality: N/A;   EYE SURGERY Bilateral 1999   Corrective laser surgery   implantable loop recorder removal  10/02/2018   MDT Reveal LINQ removed in office by Dr Johney Frame for EOL battery   IR SACROPLASTY BILATERAL  02/06/2019   LEFT HEART CATHETERIZATION WITH CORONARY ANGIOGRAM N/A 07/17/2013   Procedure: LEFT HEART CATHETERIZATION WITH CORONARY ANGIOGRAM;  Surgeon: Micheline Chapman, MD;  Location[facility] CATH LAB;  Service: Cardiovascular;  Laterality: N/A;   LOOP RECORDER IMPLANT N/A 06/05/2014  Procedure: LOOP RECORDER IMPLANT;  Surgeon: Hillis Range, MD;  Location: [facility] CATH LAB;  Service: Cardiovascular;  Laterality: N/A;   TEE WITHOUT CARDIOVERSION N/A 07/18/2013   Procedure: TRANSESOPHAGEAL ECHOCARDIOGRAM (TEE);  Surgeon: Lewayne Bunting, MD;  Location: [facility] ENDOSCOPY;  Service: Cardiovascular;  Laterality: N/A;   TEE WITHOUT CARDIOVERSION N/A 08/18/2013   Procedure: TRANSESOPHAGEAL ECHOCARDIOGRAM (TEE);  Surgeon: Donato Schultz, MD;  Location: [facility] ENDOSCOPY;  Service: Cardiovascular;   Laterality: N/A;   TEE WITHOUT CARDIOVERSION N/A 10/27/2013   Procedure: TRANSESOPHAGEAL ECHOCARDIOGRAM (TEE);  Surgeon: Pricilla Riffle, MD;  Location: [facility] ENDOSCOPY;  Service: Cardiovascular;  Laterality: N/A;   TEE WITHOUT CARDIOVERSION N/A 11/04/2014   Procedure: TRANSESOPHAGEAL ECHOCARDIOGRAM (TEE);  Surgeon: Chrystie Nose, MD;  Location: [facility] ENDOSCOPY;  Service: Cardiovascular;  Laterality: N/A;   Patient Active Problem List   Diagnosis Date Noted   History of cervical dysplasia    Capsulitis of metatarsophalangeal (MTP) joint of right foot 01/31/2021   Essential hypertension 09/10/2020   Generalized anxiety disorder 09/22/2019   Hypercholesterolemia 09/22/2019   Major depressive disorder 09/22/2019   Overweight 07/18/2019   History of lumbar spinal fusion 01/16/2019   Leg weakness, bilateral 11/28/2017   Loss of bladder control 11/28/2017   Lumbar radiculopathy 11/28/2017   Degenerative disc disease, lumbar 10/30/2017   Degenerative scoliosis 10/30/2017   Long term (current) use of anticoagulants 10/30/2017   NICM (nonischemic cardiomyopathy) 10/30/2017   Spondylolisthesis at L4-L5 level 10/30/2017   Fatigue 04/13/2014   Sinus bradycardia 08/28/2013   Coronary artery disease    Chronic systolic heart failure    Atrial fibrillation    History of asthma 07/21/2013   Moderate mitral regurgitation 07/17/2013    PCP: [name], MD  REFERRING PROVIDER: Tia Alert, MD  REFERRING DIAG: L lumbar radiculopathy  Rationale for Evaluation and Treatment: Rehabilitation  THERAPY DIAG:  Left lumbar radiculopathy  Unsteady gait when walking  Weakness of both hips  ONSET DATE: 3 weeks  SUBJECTIVE:                                                                                                                                                                                           SUBJECTIVE STATEMENT: Feeling sore from last session.   Eval: My balance has been really off  for a long time so I have been wanting to get PT , this pain in my L leg has been there for about 3 weeks.  It is a similar pain to the pain prior to my other lumbar fusion surgeries  PERTINENT HISTORY:  H/o previous L 2/3 , 3/4, 4/5 fusions  PAIN:  Are you having pain? Yes: NPRS scale: 5/10 Pain location: L lower back and lateral thigh, just below L knee Pain description: pain with positioning, prolonged sitting Aggravating factors: sitting Relieving factors: resting  PRECAUTIONS: None  RED FLAGS: None   WEIGHT BEARING RESTRICTIONS: No  FALLS:  Has patient fallen in last 6 months? Yes. Number of falls 2  LIVING ENVIRONMENT: Lives with: lives with their spouse Lives in: House/apartment  OCCUPATION: disabled  PLOF: Independent and Needs assistance with homemaking  PATIENT GOALS: to get my balance better and be able to walk and keep up with my husband and daughter  NEXT MD VISIT: 10/05/22  OBJECTIVE:   DIAGNOSTIC FINDINGS:  None performed recently  PATIENT SURVEYS:  Modified Oswestry 25/50   SCREENING FOR RED FLAGS: Bowel or bladder incontinence: No Spinal tumors: No Cauda equina syndrome: No Compression fracture: No Abdominal aneurysm: No  COGNITION: Overall cognitive status: Within functional limits for tasks assessed     SENSATION: WFL  MUSCLE LENGTH: Prone knee bend with restricted B to 90 degrees knee flexion POSTURE:  L lateral pelvic shift.  Elevated L iliac crest 1 cm, flattened LS spine   PALPATION: Point tender along L iliac crest at origin of gluteal musculature  LUMBAR ROM: NT in standing due to balance deficits, fall risk   LOWER EXTREMITY ROM:     AAROM  Right eval Left eval  Hip flexion 90 90  Hip extension -10 -10  Hip abduction    Hip adduction    Hip internal rotation    Hip external rotation    Knee flexion    Knee extension    Ankle dorsiflexion    Ankle plantarflexion    Ankle inversion    Ankle eversion     (Blank  rows = wfl)  LOWER EXTREMITY MMT:    MMT Right eval Left eval  Hip flexion    Hip extension lock bridge  4' off mat 1" off mat  Hip abduction 4 3+  Hip adduction    Hip internal rotation    Hip external rotation 4 3+  Knee flexion    Knee extension    Ankle dorsiflexion 3+ 3+  Ankle plantarflexion 3+ 3+  Ankle inversion    Ankle eversion     (Blank rows = not tested)  LUMBAR SPECIAL TESTS:  Straight leg raise test: Positiveon L at 40 degrees for lateral L hip pain  FUNCTIONAL TESTS:  30 seconds chair stand test 8 3 position standing: feet together over one min safely, tandem 30 sec with sway and close SBA needed, unilateral standing 3 sec each  GAIT: Distance walked: walked within clinic 38' at a time.  No device, did note that she weaved frequently, lost her balance when walking to check out and had to brace on wall to stabilize.   TODAY'S TREATMENT:  DATE: 10/03/22 Bike L1x44min Fwd step ups 6' x 10 bil Lateral step ups x 10 bil Runner balance x 10 bil intermittent support Kettle bell swing no weight  x 10  Leg press 20# 2x15 Figure 4 stretch 2 x 30 sec bil Hooklying LTR x 10 bil  09/29/22 Nustep L5x57min  Standing for penguins with green t band around thighs x 10  Standing for forward backward rocks with green t band around thighs x 10  Semi tandem in corner, head turns with gaze stability in corner Knee flexion 20# 2x10 Knee extension 10# 2x10 Seated hip ER x 10   09/26/22: Neuromuscular reed:  addressed balance deficits:  Completed DGI Added standing in corner , semi tandem standing, with head turns  Reviewed heel toe rocks, patient needs counter to maintain balance  Therex: instructed in the following ex to build her strength, stability, also motor control, coordination, focused primarily on hip musculture   Standing for penguins  with green t band around thighs  Standing for forward backward rocks with green t band around thighs  BATCA B knee extension 15 reps, 2 sets  BATCA B knee flexion 15 reps, 2 sets  Nustep:  level 5, Ue's and LE's 09/07/22: Therex:  Inst in heel toe rocks , next to sink at home, at intervals throughout the day, to improve righting reactions, balance Inst in seated theraband hip abd/ER , black, to perform 15 x 2 -3 x day for motor control /strength B hip abductors    PATIENT EDUCATION:  Education details: POC, goals Person educated: Patient Education method: Programmer, multimedia, Demonstration, Tactile cues, and Verbal cues Education comprehension: verbalized understanding, returned demonstration, and verbal cues required  HOME EXERCISE PROGRAM: See above  ASSESSMENT:  CLINICAL IMPRESSION: Patient is a 63 y.o. female who participated in physical therapy treatment today for L lumbar radiculopathy.  Pt showed a good tolerance for the progression of exercises. She shows LOB occasional during SLS phase of exercises. She had some discomfort in L low back after the kettle bell swings so we did some stretching after the session. Pt continues to benefit from skilled therapy.   OBJECTIVE IMPAIRMENTS: decreased activity tolerance, decreased balance, decreased coordination, decreased endurance, difficulty walking, decreased ROM, decreased strength, postural dysfunction, and pain.   ACTIVITY LIMITATIONS: carrying, lifting, bending, squatting, sleeping, transfers, and locomotion level  PARTICIPATION LIMITATIONS: cleaning, laundry, shopping, community activity, and yard work  PERSONAL FACTORS: Behavior pattern, Fitness, Past/current experiences, Time since onset of injury/illness/exacerbation, and 1-2 comorbidities: h/o afib, h/o LS fusion, osteopenia, h/o B foot fractures  are also affecting patient's functional outcome.   REHAB POTENTIAL: Good  CLINICAL DECISION MAKING: Evolving/moderate  complexity  EVALUATION COMPLEXITY: Moderate   GOALS: Goals reviewed with patient? Yes  SHORT TERM GOALS: Target date: 2 weeks 09/21/22  I HEP Baseline: Goal status: MET- 10/03/22  LONG TERM GOALS: Target date: 11/30/22  Improve 30 sec sit to stand from 8 reps to 12 reps  Baseline:  Goal status: INITIAL  2.  Improve balance, DGI 22/24 Baseline: TBD not assessed at initial eval 09/26/22: 14/24 Goal status: INITIAL  3.  Reduce L radicular pain to 50% less frequent and less intensity Baseline: constant Goal status: INITIAL  4.  Improve B hip flexion ROM to 110 each for improved sitting tolerance, improved mobility sit to stand Baseline: 90 degrees B Goal status: INITIAL  5.  Improve hip abd, ext  strength and plantar flexor strength to 4/5 Baseline: 3+/5 to 4-/5 Goal status: INITIAL  PLAN:  PT FREQUENCY: 2x/week  PT DURATION: 12 weeks  PLANNED INTERVENTIONS: Therapeutic exercises, Therapeutic activity, Neuromuscular re-education, Balance training, Gait training, Patient/Family education, Self Care, and Joint mobilization.  PLAN FOR NEXT SESSION:  cardiovascular training on recumbent machines, manual stretching B hips, additional therex to address strength and balance LE's and B hip flexibility.   Darleene Cleaver, PTA 10/03/2022, 11:12 AM

## 2022-10-05 ENCOUNTER — Other Ambulatory Visit: Payer: Self-pay

## 2022-10-05 ENCOUNTER — Ambulatory Visit: Payer: 59

## 2022-10-05 DIAGNOSIS — R2681 Unsteadiness on feet: Secondary | ICD-10-CM

## 2022-10-05 DIAGNOSIS — M5416 Radiculopathy, lumbar region: Secondary | ICD-10-CM

## 2022-10-05 DIAGNOSIS — R29898 Other symptoms and signs involving the musculoskeletal system: Secondary | ICD-10-CM

## 2022-10-05 NOTE — Therapy (Signed)
OUTPATIENT PHYSICAL THERAPY TREATMENT   Patient Name: Kiara [name] MRN: [id number] DOB:July 27, 1960, 62 y.o., female Today's Date: 10/05/2022  END OF SESSION:  PT End of Session - 10/05/22 1407     Visit Number 5    Date for PT Re-Evaluation 11/30/22    PT Start Time 1402    PT Stop Time 1446    PT Time Calculation (min) 44 min    Activity Tolerance Patient tolerated treatment well    Behavior During Therapy [facility] for tasks assessed/performed                Past Medical History:  Diagnosis Date   Atrial fibrillation    a. admx with AFib with RVR and acute systolic CHF 07/2013; TEE with LAA clot and no DCCV done;     Bilateral bunions 12/27/2020   Capsulitis of metatarsophalangeal (MTP) joint of right foot 01/31/2021   Chronic systolic heart failure    Coronary artery disease    a.  LHC (07/17/13):  LM 20%, ostial CFX 20-30%   Degenerative disc disease, lumbar 10/30/2017   Degenerative scoliosis 10/30/2017   Essential hypertension 09/10/2020   Fatigue 04/13/2014   Generalized anxiety disorder 09/22/2019   History of asthma 07/21/2013   History of cervical dysplasia 12/22/2021   History of kidney stones 2013   History of lumbar spinal fusion 01/16/2019   Hypercholesterolemia 09/22/2019   Hyperlipemia    Hypothyroidism    past hx   Leg weakness, bilateral 11/28/2017   Long term (current) use of anticoagulants 10/30/2017   Loss of bladder control 11/28/2017   Lumbar radiculopathy 11/28/2017   Major depressive disorder 09/22/2019   Moderate mitral regurgitation 07/17/2013   NICM (nonischemic cardiomyopathy)    a. Echo (07/17/13):  EF 25-30%, EF subsequently normalized with sinus rhythm (tachycardia mediated CM)   Osteopenia    Overweight 07/18/2019   Sinus bradycardia 08/28/2013   Spondylolisthesis at L4-L5 level 10/30/2017   Past Surgical History:  Procedure Laterality Date   ABDOMINAL HYSTERECTOMY  ~ 2001   ABLATION  10/28/13   PVI by Dr Johney Frame   APPENDECTOMY   1978   ATRIAL FIBRILLATION ABLATION N/A 10/28/2013   Procedure: ATRIAL FIBRILLATION ABLATION;  Surgeon: Gardiner Rhyme, MD;  Location: [facility] CATH LAB;  Service: Cardiovascular;  Laterality: N/A;   ATRIAL FIBRILLATION ABLATION N/A 11/23/2020   Procedure: ATRIAL FIBRILLATION ABLATION;  Surgeon: Hillis Range, MD;  Location: [facility] INVASIVE CV LAB;  Service: Cardiovascular;  Laterality: N/A;   BACK SURGERY Bilateral 2019   Fusion   CARDIAC CATHETERIZATION  07/2013   CARDIOVERSION N/A 08/18/2013   Procedure: CARDIOVERSION;  Surgeon: Donato Schultz, MD;  Location: [facility] ENDOSCOPY;  Service: Cardiovascular;  Laterality: N/A;   CARDIOVERSION N/A 08/29/2013   Procedure: CARDIOVERSION;  Surgeon: Pricilla Riffle, MD;  Location: [facility] OR;  Service: Cardiovascular;  Laterality: N/A;   ELECTROPHYSIOLOGIC STUDY N/A 11/05/2014   Procedure: Atrial Fibrillation Ablation;  Surgeon: Hillis Range, MD;  Location: [facility] INVASIVE CV LAB;  Service: Cardiovascular;  Laterality: N/A;   EYE SURGERY Bilateral 1999   Corrective laser surgery   implantable loop recorder removal  10/02/2018   MDT Reveal LINQ removed in office by Dr Johney Frame for EOL battery   IR SACROPLASTY BILATERAL  02/06/2019   LEFT HEART CATHETERIZATION WITH CORONARY ANGIOGRAM N/A 07/17/2013   Procedure: LEFT HEART CATHETERIZATION WITH CORONARY ANGIOGRAM;  Surgeon: Micheline Chapman, MD;  Location: [facility] CATH LAB;  Service: Cardiovascular;  Laterality: N/A;   LOOP RECORDER IMPLANT N/A  06/05/2014   Procedure: LOOP RECORDER IMPLANT;  Surgeon: Hillis Range, MD;  Location: [facility] CATH LAB;  Service: Cardiovascular;  Laterality: N/A;   TEE WITHOUT CARDIOVERSION N/A 07/18/2013   Procedure: TRANSESOPHAGEAL ECHOCARDIOGRAM (TEE);  Surgeon: Lewayne Bunting, MD;  Location: [facility] ENDOSCOPY;  Service: Cardiovascular;  Laterality: N/A;   TEE WITHOUT CARDIOVERSION N/A 08/18/2013   Procedure: TRANSESOPHAGEAL ECHOCARDIOGRAM (TEE);  Surgeon: Donato Schultz, MD;  Location: [facility] ENDOSCOPY;  Service: Cardiovascular;   Laterality: N/A;   TEE WITHOUT CARDIOVERSION N/A 10/27/2013   Procedure: TRANSESOPHAGEAL ECHOCARDIOGRAM (TEE);  Surgeon: Pricilla Riffle, MD;  Location: [facility] ENDOSCOPY;  Service: Cardiovascular;  Laterality: N/A;   TEE WITHOUT CARDIOVERSION N/A 11/04/2014   Procedure: TRANSESOPHAGEAL ECHOCARDIOGRAM (TEE);  Surgeon: Chrystie Nose, MD;  Location: [facility] ENDOSCOPY;  Service: Cardiovascular;  Laterality: N/A;   Patient Active Problem List   Diagnosis Date Noted   History of cervical dysplasia    Capsulitis of metatarsophalangeal (MTP) joint of right foot 01/31/2021   Essential hypertension 09/10/2020   Generalized anxiety disorder 09/22/2019   Hypercholesterolemia 09/22/2019   Major depressive disorder 09/22/2019   Overweight 07/18/2019   History of lumbar spinal fusion 01/16/2019   Leg weakness, bilateral 11/28/2017   Loss of bladder control 11/28/2017   Lumbar radiculopathy 11/28/2017   Degenerative disc disease, lumbar 10/30/2017   Degenerative scoliosis 10/30/2017   Long term (current) use of anticoagulants 10/30/2017   NICM (nonischemic cardiomyopathy) 10/30/2017   Spondylolisthesis at L4-L5 level 10/30/2017   Fatigue 04/13/2014   Sinus bradycardia 08/28/2013   Coronary artery disease    Chronic systolic heart failure    Atrial fibrillation    History of asthma 07/21/2013   Moderate mitral regurgitation 07/17/2013    PCP: [name], MD  REFERRING PROVIDER: Tia Alert, MD  REFERRING DIAG: L lumbar radiculopathy  Rationale for Evaluation and Treatment: Rehabilitation  THERAPY DIAG:  Left lumbar radiculopathy  Unsteady gait when walking  Weakness of both hips  ONSET DATE: 3 weeks  SUBJECTIVE:                                                                                                                                                                                           SUBJECTIVE STATEMENT: Feeling sore from last session.   Eval: My balance has been really off  for a long time so I have been wanting to get PT , this pain in my L leg has been there for about 3 weeks.  It is a similar pain to the pain prior to my other lumbar fusion surgeries  PERTINENT HISTORY:  H/o previous L 2/3 , 3/4, 4/5 fusions  PAIN:  Are you having pain? Yes: NPRS scale: 5/10 Pain location: L lower back and lateral thigh, just below L knee Pain description: pain with positioning, prolonged sitting Aggravating factors: sitting Relieving factors: resting  PRECAUTIONS: None  RED FLAGS: None   WEIGHT BEARING RESTRICTIONS: No  FALLS:  Has patient fallen in last 6 months? Yes. Number of falls 2  LIVING ENVIRONMENT: Lives with: lives with their spouse Lives in: House/apartment  OCCUPATION: disabled  PLOF: Independent and Needs assistance with homemaking  PATIENT GOALS: to get my balance better and be able to walk and keep up with my husband and daughter  NEXT MD VISIT: 10/05/22  OBJECTIVE:   DIAGNOSTIC FINDINGS:  None performed recently  PATIENT SURVEYS:  Modified Oswestry 25/50   SCREENING FOR RED FLAGS: Bowel or bladder incontinence: No Spinal tumors: No Cauda equina syndrome: No Compression fracture: No Abdominal aneurysm: No  COGNITION: Overall cognitive status: Within functional limits for tasks assessed     SENSATION: WFL  MUSCLE LENGTH: Prone knee bend with restricted B to 90 degrees knee flexion POSTURE:  L lateral pelvic shift.  Elevated L iliac crest 1 cm, flattened LS spine   PALPATION: Point tender along L iliac crest at origin of gluteal musculature  LUMBAR ROM: NT in standing due to balance deficits, fall risk   LOWER EXTREMITY ROM:     AAROM  Right eval Left eval  Hip flexion 90 90  Hip extension -10 -10  Hip abduction    Hip adduction    Hip internal rotation    Hip external rotation    Knee flexion    Knee extension    Ankle dorsiflexion    Ankle plantarflexion    Ankle inversion    Ankle eversion     (Blank  rows = wfl)  LOWER EXTREMITY MMT:    MMT Right eval Left eval  Hip flexion    Hip extension lock bridge  4' off mat 1" off mat  Hip abduction 4 3+  Hip adduction    Hip internal rotation    Hip external rotation 4 3+  Knee flexion    Knee extension    Ankle dorsiflexion 3+ 3+  Ankle plantarflexion 3+ 3+  Ankle inversion    Ankle eversion     (Blank rows = not tested)  LUMBAR SPECIAL TESTS:  Straight leg raise test: Positiveon L at 40 degrees for lateral L hip pain  FUNCTIONAL TESTS:  30 seconds chair stand test 8 3 position standing: feet together over one min safely, tandem 30 sec with sway and close SBA needed, unilateral standing 3 sec each  GAIT: Distance walked: walked within clinic 72' at a time.  No device, did note that she weaved frequently, lost her balance when walking to check out and had to brace on wall to stabilize.   TODAY'S TREATMENT:  DATE: 10/05/22:  Standing for penguins with green t band around thighs x 10  Standing for forward backward rocks with green t band around thighs x 10  Heel/toe rocks on airex  Static standing on airex, horizontal head turns Forward step ups 15 x each leg to 6" Leg press 20# B LE's 3 x 12 Seated hamstring curls, 25#, 3 x 10   10/03/22 Bike L1x68min Fwd step ups 6' x 10 bil Lateral step ups x 10 bil Runner balance x 10 bil intermittent support Kettle bell swing no weight  x 10  Leg press 20# 2x15 Figure 4 stretch 2 x 30 sec bil Hooklying LTR x 10 bil  09/29/22 Nustep L5x65min  Standing for penguins with green t band around thighs x 10  Standing for forward backward rocks with green t band around thighs x 10  Semi tandem in corner, head turns with gaze stability in corner Knee flexion 20# 2x10 Knee extension 10# 2x10 Seated hip ER x 10   09/26/22: Neuromuscular reed:  addressed balance  deficits:  Completed DGI Added standing in corner , semi tandem standing, with head turns  Reviewed heel toe rocks, patient needs counter to maintain balance  Therex: instructed in the following ex to build her strength, stability, also motor control, coordination, focused primarily on hip musculture   Standing for penguins with green t band around thighs  Standing for forward backward rocks with green t band around thighs  BATCA B knee extension 15 reps, 2 sets  BATCA B knee flexion 15 reps, 2 sets  Nustep:  level 5, Ue's and LE's  09/07/22: Therex:  Inst in heel toe rocks , next to sink at home, at intervals throughout the day, to improve righting reactions, balance Inst in seated theraband hip abd/ER , black, to perform 15 x 2 -3 x day for motor control /strength B hip abductors    PATIENT EDUCATION:  Education details: POC, goals Person educated: Patient Education method: Programmer, multimedia, Demonstration, Tactile cues, and Verbal cues Education comprehension: verbalized understanding, returned demonstration, and verbal cues required  HOME EXERCISE PROGRAM: See above  ASSESSMENT:  CLINICAL IMPRESSION: Patient is a 62 y.o. female who participated in physical therapy treatment today for L lumbar radiculopathy.  She did see her neurologist prior to today's appt and is scheduled for an MRI lumbo sacral. She tolerated all activities today well, much improved with VOR training.  She did stumble a few times with the forward step ups, leading with L LE.  Pt continues to benefit from skilled physical therapy to address her deficits and improve her overall stability, strength, activity tolerance.   OBJECTIVE IMPAIRMENTS: decreased activity tolerance, decreased balance, decreased coordination, decreased endurance, difficulty walking, decreased ROM, decreased strength, postural dysfunction, and pain.   ACTIVITY LIMITATIONS: carrying, lifting, bending, squatting, sleeping, transfers, and  locomotion level  PARTICIPATION LIMITATIONS: cleaning, laundry, shopping, community activity, and yard work  PERSONAL FACTORS: Behavior pattern, Fitness, Past/current experiences, Time since onset of injury/illness/exacerbation, and 1-2 comorbidities: h/o afib, h/o LS fusion, osteopenia, h/o B foot fractures  are also affecting patient's functional outcome.   REHAB POTENTIAL: Good  CLINICAL DECISION MAKING: Evolving/moderate complexity  EVALUATION COMPLEXITY: Moderate   GOALS: Goals reviewed with patient? Yes  SHORT TERM GOALS: Target date: 2 weeks 09/21/22  I HEP Baseline: Goal status: MET- 10/03/22  LONG TERM GOALS: Target date: 11/30/22  Improve 30 sec sit to stand from 8 reps to 12 reps  Baseline:  Goal status: INITIAL  2.  Improve balance, DGI 22/24 Baseline: TBD not assessed at initial eval 09/26/22: 14/24 Goal status: INITIAL  3.  Reduce L radicular pain to 50% less frequent and less intensity Baseline: constant Goal status: INITIAL  4.  Improve B hip flexion ROM to 110 each for improved sitting tolerance, improved mobility sit to stand Baseline: 90 degrees B Goal status: INITIAL  5.  Improve hip abd, ext  strength and plantar flexor strength to 4/5 Baseline: 3+/5 to 4-/5 Goal status: INITIAL  PLAN:  PT FREQUENCY: 2x/week  PT DURATION: 12 weeks  PLANNED INTERVENTIONS: Therapeutic exercises, Therapeutic activity, Neuromuscular re-education, Balance training, Gait training, Patient/Family education, Self Care, and Joint mobilization.  PLAN FOR NEXT SESSION:  cardiovascular training on recumbent machines, manual stretching B hips, additional therex to address strength and balance LE's and B hip flexibility.   Riata Ikeda L Garner Dullea, PT, DPT, OCS 10/05/2022, 3:49 PM

## 2022-10-09 ENCOUNTER — Ambulatory Visit: Payer: 59

## 2022-10-09 DIAGNOSIS — M5416 Radiculopathy, lumbar region: Secondary | ICD-10-CM

## 2022-10-09 DIAGNOSIS — R2681 Unsteadiness on feet: Secondary | ICD-10-CM

## 2022-10-09 DIAGNOSIS — R29898 Other symptoms and signs involving the musculoskeletal system: Secondary | ICD-10-CM

## 2022-10-09 NOTE — Therapy (Signed)
OUTPATIENT PHYSICAL THERAPY TREATMENT   Patient Name: Kiara [name] MRN: [id number] DOB:Jun 20, 1960, 62 y.o., female Today's Date: 10/09/2022  END OF SESSION:  PT End of Session - 10/09/22 1540     Visit Number 6    Date for PT Re-Evaluation 11/30/22    PT Start Time 1535    PT Stop Time 1613    PT Time Calculation (min) 38 min    Activity Tolerance Patient tolerated treatment well    Behavior During Therapy [facility] for tasks assessed/performed                 Past Medical History:  Diagnosis Date   Atrial fibrillation    a. admx with AFib with RVR and acute systolic CHF 07/2013; TEE with LAA clot and no DCCV done;     Bilateral bunions 12/27/2020   Capsulitis of metatarsophalangeal (MTP) joint of right foot 01/31/2021   Chronic systolic heart failure    Coronary artery disease    a.  LHC (07/17/13):  LM 20%, ostial CFX 20-30%   Degenerative disc disease, lumbar 10/30/2017   Degenerative scoliosis 10/30/2017   Essential hypertension 09/10/2020   Fatigue 04/13/2014   Generalized anxiety disorder 09/22/2019   History of asthma 07/21/2013   History of cervical dysplasia 12/22/2021   History of kidney stones 2013   History of lumbar spinal fusion 01/16/2019   Hypercholesterolemia 09/22/2019   Hyperlipemia    Hypothyroidism    past hx   Leg weakness, bilateral 11/28/2017   Long term (current) use of anticoagulants 10/30/2017   Loss of bladder control 11/28/2017   Lumbar radiculopathy 11/28/2017   Major depressive disorder 09/22/2019   Moderate mitral regurgitation 07/17/2013   NICM (nonischemic cardiomyopathy)    a. Echo (07/17/13):  EF 25-30%, EF subsequently normalized with sinus rhythm (tachycardia mediated CM)   Osteopenia    Overweight 07/18/2019   Sinus bradycardia 08/28/2013   Spondylolisthesis at L4-L5 level 10/30/2017   Past Surgical History:  Procedure Laterality Date   ABDOMINAL HYSTERECTOMY  ~ 2001   ABLATION  10/28/13   PVI by Dr Johney Frame   APPENDECTOMY   1978   ATRIAL FIBRILLATION ABLATION N/A 10/28/2013   Procedure: ATRIAL FIBRILLATION ABLATION;  Surgeon: Gardiner Rhyme, MD;  Location: [facility] CATH LAB;  Service: Cardiovascular;  Laterality: N/A;   ATRIAL FIBRILLATION ABLATION N/A 11/23/2020   Procedure: ATRIAL FIBRILLATION ABLATION;  Surgeon: Hillis Range, MD;  Location: [facility] INVASIVE CV LAB;  Service: Cardiovascular;  Laterality: N/A;   BACK SURGERY Bilateral 2019   Fusion   CARDIAC CATHETERIZATION  07/2013   CARDIOVERSION N/A 08/18/2013   Procedure: CARDIOVERSION;  Surgeon: Donato Schultz, MD;  Location: [facility] ENDOSCOPY;  Service: Cardiovascular;  Laterality: N/A;   CARDIOVERSION N/A 08/29/2013   Procedure: CARDIOVERSION;  Surgeon: Pricilla Riffle, MD;  Location: [facility] OR;  Service: Cardiovascular;  Laterality: N/A;   ELECTROPHYSIOLOGIC STUDY N/A 11/05/2014   Procedure: Atrial Fibrillation Ablation;  Surgeon: Hillis Range, MD;  Location: [facility] INVASIVE CV LAB;  Service: Cardiovascular;  Laterality: N/A;   EYE SURGERY Bilateral 1999   Corrective laser surgery   implantable loop recorder removal  10/02/2018   MDT Reveal LINQ removed in office by Dr Johney Frame for EOL battery   IR SACROPLASTY BILATERAL  02/06/2019   LEFT HEART CATHETERIZATION WITH CORONARY ANGIOGRAM N/A 07/17/2013   Procedure: LEFT HEART CATHETERIZATION WITH CORONARY ANGIOGRAM;  Surgeon: Micheline Chapman, MD;  Location: [facility] CATH LAB;  Service: Cardiovascular;  Laterality: N/A;   LOOP RECORDER IMPLANT  N/A 06/05/2014   Procedure: LOOP RECORDER IMPLANT;  Surgeon: Hillis Range, MD;  Location: [facility] CATH LAB;  Service: Cardiovascular;  Laterality: N/A;   TEE WITHOUT CARDIOVERSION N/A 07/18/2013   Procedure: TRANSESOPHAGEAL ECHOCARDIOGRAM (TEE);  Surgeon: Lewayne Bunting, MD;  Location: [facility] ENDOSCOPY;  Service: Cardiovascular;  Laterality: N/A;   TEE WITHOUT CARDIOVERSION N/A 08/18/2013   Procedure: TRANSESOPHAGEAL ECHOCARDIOGRAM (TEE);  Surgeon: Donato Schultz, MD;  Location: [facility] ENDOSCOPY;  Service: Cardiovascular;   Laterality: N/A;   TEE WITHOUT CARDIOVERSION N/A 10/27/2013   Procedure: TRANSESOPHAGEAL ECHOCARDIOGRAM (TEE);  Surgeon: Pricilla Riffle, MD;  Location: [facility] ENDOSCOPY;  Service: Cardiovascular;  Laterality: N/A;   TEE WITHOUT CARDIOVERSION N/A 11/04/2014   Procedure: TRANSESOPHAGEAL ECHOCARDIOGRAM (TEE);  Surgeon: Chrystie Nose, MD;  Location: [facility] ENDOSCOPY;  Service: Cardiovascular;  Laterality: N/A;   Patient Active Problem List   Diagnosis Date Noted   History of cervical dysplasia    Capsulitis of metatarsophalangeal (MTP) joint of right foot 01/31/2021   Essential hypertension 09/10/2020   Generalized anxiety disorder 09/22/2019   Hypercholesterolemia 09/22/2019   Major depressive disorder 09/22/2019   Overweight 07/18/2019   History of lumbar spinal fusion 01/16/2019   Leg weakness, bilateral 11/28/2017   Loss of bladder control 11/28/2017   Lumbar radiculopathy 11/28/2017   Degenerative disc disease, lumbar 10/30/2017   Degenerative scoliosis 10/30/2017   Long term (current) use of anticoagulants 10/30/2017   NICM (nonischemic cardiomyopathy) 10/30/2017   Spondylolisthesis at L4-L5 level 10/30/2017   Fatigue 04/13/2014   Sinus bradycardia 08/28/2013   Coronary artery disease    Chronic systolic heart failure    Atrial fibrillation    History of asthma 07/21/2013   Moderate mitral regurgitation 07/17/2013    PCP: [name], MD  REFERRING PROVIDER: Tia Alert, MD  REFERRING DIAG: L lumbar radiculopathy  Rationale for Evaluation and Treatment: Rehabilitation  THERAPY DIAG:  Left lumbar radiculopathy  Unsteady gait when walking  Weakness of both hips  ONSET DATE: 3 weeks  SUBJECTIVE:                                                                                                                                                                                           SUBJECTIVE STATEMENT: Pt reports getting MRI for lumbar spine, doctor reports L5-S1 now an  issue. Stiff today  Eval: My balance has been really off for a long time so I have been wanting to get PT , this pain in my L leg has been there for about 3 weeks.  It is a similar pain to the pain prior to my other lumbar fusion surgeries  PERTINENT  HISTORY:  H/o previous L 2/3 , 3/4, 4/5 fusions  PAIN:  Are you having pain? Yes: NPRS scale: 5/10 Pain location: L lower back and lateral thigh, just below L knee Pain description: pain with positioning, prolonged sitting Aggravating factors: sitting Relieving factors: resting  PRECAUTIONS: None  RED FLAGS: None   WEIGHT BEARING RESTRICTIONS: No  FALLS:  Has patient fallen in last 6 months? Yes. Number of falls 2  LIVING ENVIRONMENT: Lives with: lives with their spouse Lives in: House/apartment  OCCUPATION: disabled  PLOF: Independent and Needs assistance with homemaking  PATIENT GOALS: to get my balance better and be able to walk and keep up with my husband and daughter  NEXT MD VISIT: 10/05/22  OBJECTIVE:   DIAGNOSTIC FINDINGS:  None performed recently  PATIENT SURVEYS:  Modified Oswestry 25/50   SCREENING FOR RED FLAGS: Bowel or bladder incontinence: No Spinal tumors: No Cauda equina syndrome: No Compression fracture: No Abdominal aneurysm: No  COGNITION: Overall cognitive status: Within functional limits for tasks assessed     SENSATION: WFL  MUSCLE LENGTH: Prone knee bend with restricted B to 90 degrees knee flexion POSTURE:  L lateral pelvic shift.  Elevated L iliac crest 1 cm, flattened LS spine   PALPATION: Point tender along L iliac crest at origin of gluteal musculature  LUMBAR ROM: NT in standing due to balance deficits, fall risk   LOWER EXTREMITY ROM:     AAROM  Right eval Left eval  Hip flexion 90 90  Hip extension -10 -10  Hip abduction    Hip adduction    Hip internal rotation    Hip external rotation    Knee flexion    Knee extension    Ankle dorsiflexion    Ankle  plantarflexion    Ankle inversion    Ankle eversion     (Blank rows = wfl)  LOWER EXTREMITY MMT:    MMT Right eval Left eval  Hip flexion    Hip extension lock bridge  4' off mat 1" off mat  Hip abduction 4 3+  Hip adduction    Hip internal rotation    Hip external rotation 4 3+  Knee flexion    Knee extension    Ankle dorsiflexion 3+ 3+  Ankle plantarflexion 3+ 3+  Ankle inversion    Ankle eversion     (Blank rows = not tested)  LUMBAR SPECIAL TESTS:  Straight leg raise test: Positiveon L at 40 degrees for lateral L hip pain  FUNCTIONAL TESTS:  30 seconds chair stand test 8 3 position standing: feet together over one min safely, tandem 30 sec with sway and close SBA needed, unilateral standing 3 sec each  GAIT: Distance walked: walked within clinic 58' at a time.  No device, did note that she weaved frequently, lost her balance when walking to check out and had to brace on wall to stabilize.   TODAY'S TREATMENT:  DATE: 10/09/22 Bike L4x35min 19/24 DGI Toe taps to yoga block: Fwd x 10 bil Lateral x 10 bil Tandem walk 4x at counter down and back Fwd step and reach unilateral x 10 bil  10/05/22:  Standing for penguins with green t band around thighs x 10  Standing for forward backward rocks with green t band around thighs x 10  Heel/toe rocks on airex  Static standing on airex, horizontal head turns Forward step ups 15 x each leg to 6" Leg press 20# B LE's 3 x 12 Seated hamstring curls, 25#, 3 x 10   10/03/22 Bike L1x61min Fwd step ups 6' x 10 bil Lateral step ups x 10 bil Runner balance x 10 bil intermittent support Kettle bell swing no weight  x 10  Leg press 20# 2x15 Figure 4 stretch 2 x 30 sec bil Hooklying LTR x 10 bil  09/29/22 Nustep L5x61min  Standing for penguins with green t band around thighs x 10  Standing for forward  backward rocks with green t band around thighs x 10  Semi tandem in corner, head turns with gaze stability in corner Knee flexion 20# 2x10 Knee extension 10# 2x10 Seated hip ER x 10   09/26/22: Neuromuscular reed:  addressed balance deficits:  Completed DGI Added standing in corner , semi tandem standing, with head turns  Reviewed heel toe rocks, patient needs counter to maintain balance  Therex: instructed in the following ex to build her strength, stability, also motor control, coordination, focused primarily on hip musculture   Standing for penguins with green t band around thighs  Standing for forward backward rocks with green t band around thighs  BATCA B knee extension 15 reps, 2 sets  BATCA B knee flexion 15 reps, 2 sets  Nustep:  level 5, Ue's and LE's  09/07/22: Therex:  Inst in heel toe rocks , next to sink at home, at intervals throughout the day, to improve righting reactions, balance Inst in seated theraband hip abd/ER , black, to perform 15 x 2 -3 x day for motor control /strength B hip abductors    PATIENT EDUCATION:  Education details: POC, goals Person educated: Patient Education method: Programmer, multimedia, Demonstration, Tactile cues, and Verbal cues Education comprehension: verbalized understanding, returned demonstration, and verbal cues required  HOME EXERCISE PROGRAM: See above  ASSESSMENT:  CLINICAL IMPRESSION: Patient is a 62 y.o. female who participated in physical therapy treatment today for L lumbar radiculopathy.Pt shows improved balance based on DGI score. We continued progressing balance activities to improve proprioceptive reactions and reduce fall risk. She had many postural sways with the stepping exercise and tandem walk. Required minA for stability.  OBJECTIVE IMPAIRMENTS: decreased activity tolerance, decreased balance, decreased coordination, decreased endurance, difficulty walking, decreased ROM, decreased strength, postural dysfunction, and pain.    ACTIVITY LIMITATIONS: carrying, lifting, bending, squatting, sleeping, transfers, and locomotion level  PARTICIPATION LIMITATIONS: cleaning, laundry, shopping, community activity, and yard work  PERSONAL FACTORS: Behavior pattern, Fitness, Past/current experiences, Time since onset of injury/illness/exacerbation, and 1-2 comorbidities: h/o afib, h/o LS fusion, osteopenia, h/o B foot fractures  are also affecting patient's functional outcome.   REHAB POTENTIAL: Good  CLINICAL DECISION MAKING: Evolving/moderate complexity  EVALUATION COMPLEXITY: Moderate   GOALS: Goals reviewed with patient? Yes  SHORT TERM GOALS: Target date: 2 weeks 09/21/22  I HEP Baseline: Goal status: MET- 10/03/22  LONG TERM GOALS: Target date: 11/30/22  Improve 30 sec sit to stand from 8 reps to 12 reps  Baseline:  Goal status:  INITIAL  2.  Improve balance, DGI 22/24 Baseline: TBD not assessed at initial eval 09/26/22: 14/24 Goal status: INITIAL  3.  Reduce L radicular pain to 50% less frequent and less intensity Baseline: constant Goal status: INITIAL  4.  Improve B hip flexion ROM to 110 each for improved sitting tolerance, improved mobility sit to stand Baseline: 90 degrees B Goal status: INITIAL  5.  Improve hip abd, ext  strength and plantar flexor strength to 4/5 Baseline: 3+/5 to 4-/5 Goal status: INITIAL  PLAN:  PT FREQUENCY: 2x/week  PT DURATION: 12 weeks  PLANNED INTERVENTIONS: Therapeutic exercises, Therapeutic activity, Neuromuscular re-education, Balance training, Gait training, Patient/Family education, Self Care, and Joint mobilization.  PLAN FOR NEXT SESSION:  cardiovascular training on recumbent machines, manual stretching B hips, additional therex to address strength and balance LE's and B hip flexibility.   Darleene Cleaver, PTA 10/09/2022, 4:20 PM

## 2022-10-10 ENCOUNTER — Ambulatory Visit: Payer: 59

## 2022-10-11 ENCOUNTER — Ambulatory Visit: Payer: 59

## 2022-10-11 ENCOUNTER — Ambulatory Visit: Payer: 59 | Admitting: Behavioral Health

## 2022-10-11 ENCOUNTER — Encounter: Payer: Self-pay | Admitting: Behavioral Health

## 2022-10-11 DIAGNOSIS — R29898 Other symptoms and signs involving the musculoskeletal system: Secondary | ICD-10-CM

## 2022-10-11 DIAGNOSIS — R2681 Unsteadiness on feet: Secondary | ICD-10-CM

## 2022-10-11 DIAGNOSIS — M5416 Radiculopathy, lumbar region: Secondary | ICD-10-CM | POA: Diagnosis not present

## 2022-10-11 DIAGNOSIS — F4381 Prolonged grief disorder: Secondary | ICD-10-CM

## 2022-10-11 DIAGNOSIS — F4321 Adjustment disorder with depressed mood: Secondary | ICD-10-CM
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
## 2022-10-11 NOTE — Therapy (Signed)
OUTPATIENT PHYSICAL THERAPY TREATMENT   Patient Name: Kiara [name] MRN: [id number] DOB:[birth year], 62 y.o., female Today's Date: 10/11/2022  END OF SESSION:  PT End of Session - 10/11/22 0939     Visit Number 7    Date for PT Re-Evaluation 11/30/22    PT Start Time 0935    PT Stop Time 1017    PT Time Calculation (min) 42 min    Activity Tolerance Patient tolerated treatment well    Behavior During Therapy [facility] for tasks assessed/performed                  Past Medical History:  Diagnosis Date   Atrial fibrillation    a. admx with AFib with RVR and acute systolic CHF 07/2013; TEE with LAA clot and no DCCV done;     Bilateral bunions 12/27/2020   Capsulitis of metatarsophalangeal (MTP) joint of right foot 01/31/2021   Chronic systolic heart failure    Coronary artery disease    a.  LHC (07/17/13):  LM 20%, ostial CFX 20-30%   Degenerative disc disease, lumbar 10/30/2017   Degenerative scoliosis 10/30/2017   Essential hypertension 09/10/2020   Fatigue 04/13/2014   Generalized anxiety disorder 09/22/2019   History of asthma 07/21/2013   History of cervical dysplasia 12/22/2021   History of kidney stones 2013   History of lumbar spinal fusion 01/16/2019   Hypercholesterolemia 09/22/2019   Hyperlipemia    Hypothyroidism    past hx   Leg weakness, bilateral 11/28/2017   Long term (current) use of anticoagulants 10/30/2017   Loss of bladder control 11/28/2017   Lumbar radiculopathy 11/28/2017   Major depressive disorder 09/22/2019   Moderate mitral regurgitation 07/17/2013   NICM (nonischemic cardiomyopathy)    a. Echo (07/17/13):  EF 25-30%, EF subsequently normalized with sinus rhythm (tachycardia mediated CM)   Osteopenia    Overweight 07/18/2019   Sinus bradycardia 08/28/2013   Spondylolisthesis at L4-L5 level 10/30/2017   Past Surgical History:  Procedure Laterality Date   ABDOMINAL HYSTERECTOMY  ~ 2001   ABLATION  10/28/13   PVI by Dr Johney Frame    APPENDECTOMY  1978   ATRIAL FIBRILLATION ABLATION N/A 10/28/2013   Procedure: ATRIAL FIBRILLATION ABLATION;  Surgeon: Gardiner Rhyme, MD;  Location: [facility] CATH LAB;  Service: Cardiovascular;  Laterality: N/A;   ATRIAL FIBRILLATION ABLATION N/A 11/23/2020   Procedure: ATRIAL FIBRILLATION ABLATION;  Surgeon: Hillis Range, MD;  Location: [facility] INVASIVE CV LAB;  Service: Cardiovascular;  Laterality: N/A;   BACK SURGERY Bilateral 2019   Fusion   CARDIAC CATHETERIZATION  07/2013   CARDIOVERSION N/A 08/18/2013   Procedure: CARDIOVERSION;  Surgeon: Donato Schultz, MD;  Location: [facility] ENDOSCOPY;  Service: Cardiovascular;  Laterality: N/A;   CARDIOVERSION N/A 08/29/2013   Procedure: CARDIOVERSION;  Surgeon: Pricilla Riffle, MD;  Location: [facility] OR;  Service: Cardiovascular;  Laterality: N/A;   ELECTROPHYSIOLOGIC STUDY N/A 11/05/2014   Procedure: Atrial Fibrillation Ablation;  Surgeon: Hillis Range, MD;  Location: [facility] INVASIVE CV LAB;  Service: Cardiovascular;  Laterality: N/A;   EYE SURGERY Bilateral 1999   Corrective laser surgery   implantable loop recorder removal  10/02/2018   MDT Reveal LINQ removed in office by Dr Johney Frame for EOL battery   IR SACROPLASTY BILATERAL  02/06/2019   LEFT HEART CATHETERIZATION WITH CORONARY ANGIOGRAM N/A 07/17/2013   Procedure: LEFT HEART CATHETERIZATION WITH CORONARY ANGIOGRAM;  Surgeon: Micheline Chapman, MD;  Location: [facility] CATH LAB;  Service: Cardiovascular;  Laterality: N/A;   LOOP RECORDER  IMPLANT N/A 06/05/2014   Procedure: LOOP RECORDER IMPLANT;  Surgeon: Hillis Range, MD;  Location: [facility] CATH LAB;  Service: Cardiovascular;  Laterality: N/A;   TEE WITHOUT CARDIOVERSION N/A 07/18/2013   Procedure: TRANSESOPHAGEAL ECHOCARDIOGRAM (TEE);  Surgeon: Lewayne Bunting, MD;  Location: [facility] ENDOSCOPY;  Service: Cardiovascular;  Laterality: N/A;   TEE WITHOUT CARDIOVERSION N/A 08/18/2013   Procedure: TRANSESOPHAGEAL ECHOCARDIOGRAM (TEE);  Surgeon: Donato Schultz, MD;  Location: [facility] ENDOSCOPY;  Service:  Cardiovascular;  Laterality: N/A;   TEE WITHOUT CARDIOVERSION N/A 10/27/2013   Procedure: TRANSESOPHAGEAL ECHOCARDIOGRAM (TEE);  Surgeon: Pricilla Riffle, MD;  Location: [facility] ENDOSCOPY;  Service: Cardiovascular;  Laterality: N/A;   TEE WITHOUT CARDIOVERSION N/A 11/04/2014   Procedure: TRANSESOPHAGEAL ECHOCARDIOGRAM (TEE);  Surgeon: Chrystie Nose, MD;  Location: [facility] ENDOSCOPY;  Service: Cardiovascular;  Laterality: N/A;   Patient Active Problem List   Diagnosis Date Noted   History of cervical dysplasia    Capsulitis of metatarsophalangeal (MTP) joint of right foot 01/31/2021   Essential hypertension 09/10/2020   Generalized anxiety disorder 09/22/2019   Hypercholesterolemia 09/22/2019   Major depressive disorder 09/22/2019   Overweight 07/18/2019   History of lumbar spinal fusion 01/16/2019   Leg weakness, bilateral 11/28/2017   Loss of bladder control 11/28/2017   Lumbar radiculopathy 11/28/2017   Degenerative disc disease, lumbar 10/30/2017   Degenerative scoliosis 10/30/2017   Long term (current) use of anticoagulants 10/30/2017   NICM (nonischemic cardiomyopathy) 10/30/2017   Spondylolisthesis at L4-L5 level 10/30/2017   Fatigue 04/13/2014   Sinus bradycardia 08/28/2013   Coronary artery disease    Chronic systolic heart failure    Atrial fibrillation    History of asthma 07/21/2013   Moderate mitral regurgitation 07/17/2013    PCP: [name], MD  REFERRING PROVIDER: Tia Alert, MD  REFERRING DIAG: L lumbar radiculopathy  Rationale for Evaluation and Treatment: Rehabilitation  THERAPY DIAG:  Left lumbar radiculopathy  Unsteady gait when walking  Weakness of both hips  ONSET DATE: 3 weeks  SUBJECTIVE:                                                                                                                                                                                           SUBJECTIVE STATEMENT: Pt reports soreness from last visit.   Eval: My  balance has been really off for a long time so I have been wanting to get PT , this pain in my L leg has been there for about 3 weeks.  It is a similar pain to the pain prior to my other lumbar fusion surgeries  PERTINENT HISTORY:  H/o previous L 2/3 ,  3/4, 4/5 fusions  PAIN:  Are you having pain? Yes: NPRS scale: 6/10 Pain location: L lower back and lateral thigh, just below L knee Pain description: pain with positioning, prolonged sitting Aggravating factors: sitting Relieving factors: resting  PRECAUTIONS: None  RED FLAGS: None   WEIGHT BEARING RESTRICTIONS: No  FALLS:  Has patient fallen in last 6 months? Yes. Number of falls 2  LIVING ENVIRONMENT: Lives with: lives with their spouse Lives in: House/apartment  OCCUPATION: disabled  PLOF: Independent and Needs assistance with homemaking  PATIENT GOALS: to get my balance better and be able to walk and keep up with my husband and daughter  NEXT MD VISIT: 10/05/22  OBJECTIVE:   DIAGNOSTIC FINDINGS:  None performed recently  PATIENT SURVEYS:  Modified Oswestry 25/50   SCREENING FOR RED FLAGS: Bowel or bladder incontinence: No Spinal tumors: No Cauda equina syndrome: No Compression fracture: No Abdominal aneurysm: No  COGNITION: Overall cognitive status: Within functional limits for tasks assessed     SENSATION: WFL  MUSCLE LENGTH: Prone knee bend with restricted B to 90 degrees knee flexion POSTURE:  L lateral pelvic shift.  Elevated L iliac crest 1 cm, flattened LS spine   PALPATION: Point tender along L iliac crest at origin of gluteal musculature  LUMBAR ROM: NT in standing due to balance deficits, fall risk   LOWER EXTREMITY ROM:     AAROM  Right eval Left eval  Hip flexion 90 90  Hip extension -10 -10  Hip abduction    Hip adduction    Hip internal rotation    Hip external rotation    Knee flexion    Knee extension    Ankle dorsiflexion    Ankle plantarflexion    Ankle inversion     Ankle eversion     (Blank rows = wfl)  LOWER EXTREMITY MMT:    MMT Right eval Left eval  Hip flexion    Hip extension lock bridge  4' off mat 1" off mat  Hip abduction 4 3+  Hip adduction    Hip internal rotation    Hip external rotation 4 3+  Knee flexion    Knee extension    Ankle dorsiflexion 3+ 3+  Ankle plantarflexion 3+ 3+  Ankle inversion    Ankle eversion     (Blank rows = not tested)  LUMBAR SPECIAL TESTS:  Straight leg raise test: Positiveon L at 40 degrees for lateral L hip pain  FUNCTIONAL TESTS:  30 seconds chair stand test 8 3 position standing: feet together over one min safely, tandem 30 sec with sway and close SBA needed, unilateral standing 3 sec each  GAIT: Distance walked: walked within clinic 62' at a time.  No device, did note that she weaved frequently, lost her balance when walking to check out and had to brace on wall to stabilize.   TODAY'S TREATMENT:  DATE: 10/11/22 Nustep L6x28min Clocks 12 o'clock to 6 o'clock bil 5x Standing on airex head turns x 10 fixed gaze Standing on airex trunk rotations x 10 Toe taps from airex x 10 bil STM to L lumbar paraspinals, upper glutes  10/09/22 Bike L4x8min 19/24 DGI Toe taps to yoga block: Fwd x 10 bil Lateral x 10 bil Tandem walk 4x at counter down and back Fwd step and reach unilateral x 10 bil  10/05/22:  Standing for penguins with green t band around thighs x 10  Standing for forward backward rocks with green t band around thighs x 10  Heel/toe rocks on airex  Static standing on airex, horizontal head turns Forward step ups 15 x each leg to 6" Leg press 20# B LE's 3 x 12 Seated hamstring curls, 25#, 3 x 10   10/03/22 Bike L1x26min Fwd step ups 6' x 10 bil Lateral step ups x 10 bil Runner balance x 10 bil intermittent support Kettle bell swing no weight  x 10  Leg  press 20# 2x15 Figure 4 stretch 2 x 30 sec bil Hooklying LTR x 10 bil  09/29/22 Nustep L5x45min  Standing for penguins with green t band around thighs x 10  Standing for forward backward rocks with green t band around thighs x 10  Semi tandem in corner, head turns with gaze stability in corner Knee flexion 20# 2x10 Knee extension 10# 2x10 Seated hip ER x 10   09/26/22: Neuromuscular reed:  addressed balance deficits:  Completed DGI Added standing in corner , semi tandem standing, with head turns  Reviewed heel toe rocks, patient needs counter to maintain balance  Therex: instructed in the following ex to build her strength, stability, also motor control, coordination, focused primarily on hip musculture   Standing for penguins with green t band around thighs  Standing for forward backward rocks with green t band around thighs  BATCA B knee extension 15 reps, 2 sets  BATCA B knee flexion 15 reps, 2 sets  Nustep:  level 5, Ue's and LE's  09/07/22: Therex:  Inst in heel toe rocks , next to sink at home, at intervals throughout the day, to improve righting reactions, balance Inst in seated theraband hip abd/ER , black, to perform 15 x 2 -3 x day for motor control /strength B hip abductors    PATIENT EDUCATION:  Education details: HEP update- see below Person educated: Patient Education method: Explanation, Demonstration, Tactile cues, and Verbal cues Education comprehension: verbalized understanding, returned demonstration, and verbal cues required  HOME EXERCISE PROGRAM: Access Code: [email or web address] URL: [email or web address] Date: 10/11/2022 Prepared by: Verta Ellen  Exercises - Standing 4-Way Leg Reach with Counter Support  - 1 x daily - 7 x weekly - 3 sets - 10 reps  ASSESSMENT:  CLINICAL IMPRESSION: Continued to focus on balance incorporating more single leg activities. She was very challenged with the clocks as well as toe taps from airex. CGA required with  most balance activities and cues for postural alignment prior to movement to maintain balance. She had slightly increased pain in L hip/low back today so addressed it with MT. Needs more work on dynamic balance and strength. She continues to show potential for improvement and would continue to benefit from skilled therapy.  OBJECTIVE IMPAIRMENTS: decreased activity tolerance, decreased balance, decreased coordination, decreased endurance, difficulty walking, decreased ROM, decreased strength, postural dysfunction, and pain.   ACTIVITY LIMITATIONS: carrying, lifting, bending, squatting, sleeping, transfers, and locomotion level  PARTICIPATION LIMITATIONS: cleaning, laundry,  shopping, community activity, and yard work  PERSONAL FACTORS: Behavior pattern, Fitness, Past/current experiences, Time since onset of injury/illness/exacerbation, and 1-2 comorbidities: h/o afib, h/o LS fusion, osteopenia, h/o B foot fractures  are also affecting patient's functional outcome.   REHAB POTENTIAL: Good  CLINICAL DECISION MAKING: Evolving/moderate complexity  EVALUATION COMPLEXITY: Moderate   GOALS: Goals reviewed with patient? Yes  SHORT TERM GOALS: Target date: 2 weeks 09/21/22  I HEP Baseline: Goal status: MET- 10/03/22  LONG TERM GOALS: Target date: 11/30/22  Improve 30 sec sit to stand from 8 reps to 12 reps  Baseline:  Goal status: INITIAL  2.  Improve balance, DGI 22/24 Baseline: TBD not assessed at initial eval 09/26/22: 14/24 Goal status: Progressing- 10/09/22  3.  Reduce L radicular pain to 50% less frequent and less intensity Baseline: constant Goal status: INITIAL  4.  Improve B hip flexion ROM to 110 each for improved sitting tolerance, improved mobility sit to stand Baseline: 90 degrees B Goal status: INITIAL  5.  Improve hip abd, ext  strength and plantar flexor strength to 4/5 Baseline: 3+/5 to 4-/5 Goal status: INITIAL  PLAN:  PT FREQUENCY: 2x/week  PT DURATION: 12  weeks  PLANNED INTERVENTIONS: Therapeutic exercises, Therapeutic activity, Neuromuscular re-education, Balance training, Gait training, Patient/Family education, Self Care, and Joint mobilization.  PLAN FOR NEXT SESSION:  cardiovascular training on recumbent machines, manual stretching B hips, additional therex to address strength and balance LE's and B hip flexibility.   Darleene Cleaver, PTA 10/11/2022, 10:20 AM

## 2022-10-17 ENCOUNTER — Ambulatory Visit: Payer: 59 | Attending: Neurological Surgery

## 2022-10-17 DIAGNOSIS — M5416 Radiculopathy, lumbar region: Secondary | ICD-10-CM | POA: Diagnosis present

## 2022-10-17 DIAGNOSIS — R2681 Unsteadiness on feet: Secondary | ICD-10-CM | POA: Diagnosis present

## 2022-10-17 DIAGNOSIS — R29898 Other symptoms and signs involving the musculoskeletal system: Secondary | ICD-10-CM | POA: Diagnosis present

## 2022-10-17 NOTE — Therapy (Signed)
OUTPATIENT PHYSICAL THERAPY TREATMENT   Patient Name: Kiara [name] MRN: [id number] DOB:04/10/1960, 62 y.o., female Today's Date: 10/17/2022  END OF SESSION:  PT End of Session - 10/17/22 1709     Visit Number 8    Date for PT Re-Evaluation 11/30/22    PT Start Time 1705    PT Stop Time 1745    PT Time Calculation (min) 40 min    Activity Tolerance Patient tolerated treatment well    Behavior During Therapy [facility] for tasks assessed/performed                   Past Medical History:  Diagnosis Date   Atrial fibrillation    a. admx with AFib with RVR and acute systolic CHF 07/2013; TEE with LAA clot and no DCCV done;     Bilateral bunions 12/27/2020   Capsulitis of metatarsophalangeal (MTP) joint of right foot 01/31/2021   Chronic systolic heart failure    Coronary artery disease    a.  LHC (07/17/13):  LM 20%, ostial CFX 20-30%   Degenerative disc disease, lumbar 10/30/2017   Degenerative scoliosis 10/30/2017   Essential hypertension 09/10/2020   Fatigue 04/13/2014   Generalized anxiety disorder 09/22/2019   History of asthma 07/21/2013   History of cervical dysplasia 12/22/2021   History of kidney stones 2013   History of lumbar spinal fusion 01/16/2019   Hypercholesterolemia 09/22/2019   Hyperlipemia    Hypothyroidism    past hx   Leg weakness, bilateral 11/28/2017   Long term (current) use of anticoagulants 10/30/2017   Loss of bladder control 11/28/2017   Lumbar radiculopathy 11/28/2017   Major depressive disorder 09/22/2019   Moderate mitral regurgitation 07/17/2013   NICM (nonischemic cardiomyopathy)    a. Echo (07/17/13):  EF 25-30%, EF subsequently normalized with sinus rhythm (tachycardia mediated CM)   Osteopenia    Overweight 07/18/2019   Sinus bradycardia 08/28/2013   Spondylolisthesis at L4-L5 level 10/30/2017   Past Surgical History:  Procedure Laterality Date   ABDOMINAL HYSTERECTOMY  ~ 2001   ABLATION  10/28/13   PVI by Dr Johney Frame    APPENDECTOMY  1978   ATRIAL FIBRILLATION ABLATION N/A 10/28/2013   Procedure: ATRIAL FIBRILLATION ABLATION;  Surgeon: Gardiner Rhyme, MD;  Location: [facility] CATH LAB;  Service: Cardiovascular;  Laterality: N/A;   ATRIAL FIBRILLATION ABLATION N/A 11/23/2020   Procedure: ATRIAL FIBRILLATION ABLATION;  Surgeon: Hillis Range, MD;  Location: [facility] INVASIVE CV LAB;  Service: Cardiovascular;  Laterality: N/A;   BACK SURGERY Bilateral 2019   Fusion   CARDIAC CATHETERIZATION  07/2013   CARDIOVERSION N/A 08/18/2013   Procedure: CARDIOVERSION;  Surgeon: Donato Schultz, MD;  Location: [facility] ENDOSCOPY;  Service: Cardiovascular;  Laterality: N/A;   CARDIOVERSION N/A 08/29/2013   Procedure: CARDIOVERSION;  Surgeon: Pricilla Riffle, MD;  Location: [facility] OR;  Service: Cardiovascular;  Laterality: N/A;   ELECTROPHYSIOLOGIC STUDY N/A 11/05/2014   Procedure: Atrial Fibrillation Ablation;  Surgeon: Hillis Range, MD;  Location: [facility] INVASIVE CV LAB;  Service: Cardiovascular;  Laterality: N/A;   EYE SURGERY Bilateral 1999   Corrective laser surgery   implantable loop recorder removal  10/02/2018   MDT Reveal LINQ removed in office by Dr Johney Frame for EOL battery   IR SACROPLASTY BILATERAL  02/06/2019   LEFT HEART CATHETERIZATION WITH CORONARY ANGIOGRAM N/A 07/17/2013   Procedure: LEFT HEART CATHETERIZATION WITH CORONARY ANGIOGRAM;  Surgeon: Micheline Chapman, MD;  Location: [facility] CATH LAB;  Service: Cardiovascular;  Laterality: N/A;   LOOP  RECORDER IMPLANT N/A 06/05/2014   Procedure: LOOP RECORDER IMPLANT;  Surgeon: Hillis Range, MD;  Location: [facility] CATH LAB;  Service: Cardiovascular;  Laterality: N/A;   TEE WITHOUT CARDIOVERSION N/A 07/18/2013   Procedure: TRANSESOPHAGEAL ECHOCARDIOGRAM (TEE);  Surgeon: Lewayne Bunting, MD;  Location: [facility] ENDOSCOPY;  Service: Cardiovascular;  Laterality: N/A;   TEE WITHOUT CARDIOVERSION N/A 08/18/2013   Procedure: TRANSESOPHAGEAL ECHOCARDIOGRAM (TEE);  Surgeon: Donato Schultz, MD;  Location: [facility] ENDOSCOPY;  Service:  Cardiovascular;  Laterality: N/A;   TEE WITHOUT CARDIOVERSION N/A 10/27/2013   Procedure: TRANSESOPHAGEAL ECHOCARDIOGRAM (TEE);  Surgeon: Pricilla Riffle, MD;  Location: [facility] ENDOSCOPY;  Service: Cardiovascular;  Laterality: N/A;   TEE WITHOUT CARDIOVERSION N/A 11/04/2014   Procedure: TRANSESOPHAGEAL ECHOCARDIOGRAM (TEE);  Surgeon: Chrystie Nose, MD;  Location: [facility] ENDOSCOPY;  Service: Cardiovascular;  Laterality: N/A;   Patient Active Problem List   Diagnosis Date Noted   History of cervical dysplasia    Capsulitis of metatarsophalangeal (MTP) joint of right foot 01/31/2021   Essential hypertension 09/10/2020   Generalized anxiety disorder 09/22/2019   Hypercholesterolemia 09/22/2019   Major depressive disorder 09/22/2019   Overweight 07/18/2019   History of lumbar spinal fusion 01/16/2019   Leg weakness, bilateral 11/28/2017   Loss of bladder control 11/28/2017   Lumbar radiculopathy 11/28/2017   Degenerative disc disease, lumbar 10/30/2017   Degenerative scoliosis 10/30/2017   Long term (current) use of anticoagulants 10/30/2017   NICM (nonischemic cardiomyopathy) 10/30/2017   Spondylolisthesis at L4-L5 level 10/30/2017   Fatigue 04/13/2014   Sinus bradycardia 08/28/2013   Coronary artery disease    Chronic systolic heart failure    Atrial fibrillation    History of asthma 07/21/2013   Moderate mitral regurgitation 07/17/2013    PCP: [name], MD  REFERRING PROVIDER: Tia Alert, MD  REFERRING DIAG: L lumbar radiculopathy  Rationale for Evaluation and Treatment: Rehabilitation  THERAPY DIAG:  Left lumbar radiculopathy  Unsteady gait when walking  Weakness of both hips  ONSET DATE: 3 weeks  SUBJECTIVE:                                                                                                                                                                                           SUBJECTIVE STATEMENT: Pt wore out from this weekend.   Eval: My balance has  been really off for a long time so I have been wanting to get PT , this pain in my L leg has been there for about 3 weeks.  It is a similar pain to the pain prior to my other lumbar fusion surgeries  PERTINENT HISTORY:  H/o previous L 2/3 ,  3/4, 4/5 fusions  PAIN:  Are you having pain? Yes: NPRS scale: 6/10 Pain location: L lower back and lateral thigh, just below L knee Pain description: pain with positioning, prolonged sitting Aggravating factors: sitting Relieving factors: resting  PRECAUTIONS: None  RED FLAGS: None   WEIGHT BEARING RESTRICTIONS: No  FALLS:  Has patient fallen in last 6 months? Yes. Number of falls 2  LIVING ENVIRONMENT: Lives with: lives with their spouse Lives in: House/apartment  OCCUPATION: disabled  PLOF: Independent and Needs assistance with homemaking  PATIENT GOALS: to get my balance better and be able to walk and keep up with my husband and daughter  NEXT MD VISIT: 10/05/22  OBJECTIVE:   DIAGNOSTIC FINDINGS:  None performed recently  PATIENT SURVEYS:  Modified Oswestry 25/50   SCREENING FOR RED FLAGS: Bowel or bladder incontinence: No Spinal tumors: No Cauda equina syndrome: No Compression fracture: No Abdominal aneurysm: No  COGNITION: Overall cognitive status: Within functional limits for tasks assessed     SENSATION: WFL  MUSCLE LENGTH: Prone knee bend with restricted B to 90 degrees knee flexion POSTURE:  L lateral pelvic shift.  Elevated L iliac crest 1 cm, flattened LS spine   PALPATION: Point tender along L iliac crest at origin of gluteal musculature  LUMBAR ROM: NT in standing due to balance deficits, fall risk   LOWER EXTREMITY ROM:     AAROM  Right eval Left eval  Hip flexion 90 90  Hip extension -10 -10  Hip abduction    Hip adduction    Hip internal rotation    Hip external rotation    Knee flexion    Knee extension    Ankle dorsiflexion    Ankle plantarflexion    Ankle inversion    Ankle  eversion     (Blank rows = wfl)  LOWER EXTREMITY MMT:    MMT Right eval Left eval  Hip flexion    Hip extension lock bridge  4' off mat 1" off mat  Hip abduction 4 3+  Hip adduction    Hip internal rotation    Hip external rotation 4 3+  Knee flexion    Knee extension    Ankle dorsiflexion 3+ 3+  Ankle plantarflexion 3+ 3+  Ankle inversion    Ankle eversion     (Blank rows = not tested)  LUMBAR SPECIAL TESTS:  Straight leg raise test: Positiveon L at 40 degrees for lateral L hip pain  FUNCTIONAL TESTS:  30 seconds chair stand test 8 3 position standing: feet together over one min safely, tandem 30 sec with sway and close SBA needed, unilateral standing 3 sec each  GAIT: Distance walked: walked within clinic 46' at a time.  No device, did note that she weaved frequently, lost her balance when walking to check out and had to brace on wall to stabilize.   TODAY'S TREATMENT:  DATE: 10/17/22 Nustep L5x49min Prone hip extension bil x 12  S/L hip abduction x 12 bil  STM to L glute med/min Passive stretch into ER and IR with mobilization  10/11/22 Nustep L6x77min Clocks 12 o'clock to 6 o'clock bil 5x Standing on airex head turns x 10 fixed gaze Standing on airex trunk rotations x 10 Toe taps from airex x 10 bil STM to L lumbar paraspinals, upper glutes  10/09/22 Bike L4x43min 19/24 DGI Toe taps to yoga block: Fwd x 10 bil Lateral x 10 bil Tandem walk 4x at counter down and back Fwd step and reach unilateral x 10 bil  10/05/22:  Standing for penguins with green t band around thighs x 10  Standing for forward backward rocks with green t band around thighs x 10  Heel/toe rocks on airex  Static standing on airex, horizontal head turns Forward step ups 15 x each leg to 6" Leg press 20# B LE's 3 x 12 Seated hamstring curls, 25#, 3 x  10   10/03/22 Bike L1x13min Fwd step ups 6' x 10 bil Lateral step ups x 10 bil Runner balance x 10 bil intermittent support Kettle bell swing no weight  x 10  Leg press 20# 2x15 Figure 4 stretch 2 x 30 sec bil Hooklying LTR x 10 bil  09/29/22 Nustep L5x76min  Standing for penguins with green t band around thighs x 10  Standing for forward backward rocks with green t band around thighs x 10  Semi tandem in corner, head turns with gaze stability in corner Knee flexion 20# 2x10 Knee extension 10# 2x10 Seated hip ER x 10   09/26/22: Neuromuscular reed:  addressed balance deficits:  Completed DGI Added standing in corner , semi tandem standing, with head turns  Reviewed heel toe rocks, patient needs counter to maintain balance  Therex: instructed in the following ex to build her strength, stability, also motor control, coordination, focused primarily on hip musculture   Standing for penguins with green t band around thighs  Standing for forward backward rocks with green t band around thighs  BATCA B knee extension 15 reps, 2 sets  BATCA B knee flexion 15 reps, 2 sets  Nustep:  level 5, Ue's and LE's  09/07/22: Therex:  Inst in heel toe rocks , next to sink at home, at intervals throughout the day, to improve righting reactions, balance Inst in seated theraband hip abd/ER , black, to perform 15 x 2 -3 x day for motor control /strength B hip abductors    PATIENT EDUCATION:  Education details: HEP update- see below Person educated: Patient Education method: Explanation, Demonstration, Tactile cues, and Verbal cues Education comprehension: verbalized understanding, returned demonstration, and verbal cues required  HOME EXERCISE PROGRAM: Access Code: [email or web address] URL: [email or web address] Date: 10/17/2022 Prepared by: Verta Ellen  Exercises - Standing 4-Way Leg Reach with Counter Support  - 1 x daily - 7 x weekly - 3 sets - 10 reps - Sidelying Hip Abduction  - 1 x  daily - 7 x weekly - 2 sets - 10 reps - Prone Hip Extension  - 1 x daily - 7 x weekly - 2 sets - 10 reps  ASSESSMENT:  CLINICAL IMPRESSION: Pt arrived increasingly fatigued in LE from busy weekend. She had very tender spot over L glute med with palpation. Majority of session focused on manual techniques to improve mobility and soft tissue restrictions. Followed with strengthening for gluteal muscles and added to HEP. She continues to show potential for  improvement and would continue to benefit from skilled therapy.  OBJECTIVE IMPAIRMENTS: decreased activity tolerance, decreased balance, decreased coordination, decreased endurance, difficulty walking, decreased ROM, decreased strength, postural dysfunction, and pain.   ACTIVITY LIMITATIONS: carrying, lifting, bending, squatting, sleeping, transfers, and locomotion level  PARTICIPATION LIMITATIONS: cleaning, laundry, shopping, community activity, and yard work  PERSONAL FACTORS: Behavior pattern, Fitness, Past/current experiences, Time since onset of injury/illness/exacerbation, and 1-2 comorbidities: h/o afib, h/o LS fusion, osteopenia, h/o B foot fractures  are also affecting patient's functional outcome.   REHAB POTENTIAL: Good  CLINICAL DECISION MAKING: Evolving/moderate complexity  EVALUATION COMPLEXITY: Moderate   GOALS: Goals reviewed with patient? Yes  SHORT TERM GOALS: Target date: 2 weeks 09/21/22  I HEP Baseline: Goal status: MET- 10/03/22  LONG TERM GOALS: Target date: 11/30/22  Improve 30 sec sit to stand from 8 reps to 12 reps  Baseline:  Goal status: INITIAL  2.  Improve balance, DGI 22/24 Baseline: TBD not assessed at initial eval 09/26/22: 14/24 Goal status: Progressing- 10/09/22  3.  Reduce L radicular pain to 50% less frequent and less intensity Baseline: constant Goal status: INITIAL  4.  Improve B hip flexion ROM to 110 each for improved sitting tolerance, improved mobility sit to stand Baseline: 90  degrees B Goal status: INITIAL  5.  Improve hip abd, ext  strength and plantar flexor strength to 4/5 Baseline: 3+/5 to 4-/5 Goal status: INITIAL  PLAN:  PT FREQUENCY: 2x/week  PT DURATION: 12 weeks  PLANNED INTERVENTIONS: Therapeutic exercises, Therapeutic activity, Neuromuscular re-education, Balance training, Gait training, Patient/Family education, Self Care, and Joint mobilization.  PLAN FOR NEXT SESSION:  cardiovascular training on recumbent machines, manual stretching B hips, additional therex to address strength and balance LE's and B hip flexibility.   Darleene Cleaver, PTA 10/17/2022, 5:53 PM

## 2022-10-19 ENCOUNTER — Other Ambulatory Visit: Payer: Self-pay

## 2022-10-19 ENCOUNTER — Ambulatory Visit: Payer: 59

## 2022-10-19 DIAGNOSIS — M5416 Radiculopathy, lumbar region: Secondary | ICD-10-CM

## 2022-10-19 DIAGNOSIS — R29898 Other symptoms and signs involving the musculoskeletal system: Secondary | ICD-10-CM

## 2022-10-19 DIAGNOSIS — R2681 Unsteadiness on feet: Secondary | ICD-10-CM

## 2022-10-19 NOTE — Therapy (Signed)
OUTPATIENT PHYSICAL THERAPY TREATMENT   Patient Name: Kiara [name] MRN: [id number] DOB:Jun 24, 1960, 62 y.o., female Today's Date: 10/19/2022  END OF SESSION:  PT End of Session - 10/19/22 1450     Visit Number 9    Date for PT Re-Evaluation 11/30/22    Progress Note Due on Visit 10    PT Start Time 1450    PT Stop Time 1533    PT Time Calculation (min) 43 min    Activity Tolerance Patient tolerated treatment well    Behavior During Therapy [facility] for tasks assessed/performed                    Past Medical History:  Diagnosis Date   Atrial fibrillation    a. admx with AFib with RVR and acute systolic CHF 07/2013; TEE with LAA clot and no DCCV done;     Bilateral bunions 12/27/2020   Capsulitis of metatarsophalangeal (MTP) joint of right foot 01/31/2021   Chronic systolic heart failure    Coronary artery disease    a.  LHC (07/17/13):  LM 20%, ostial CFX 20-30%   Degenerative disc disease, lumbar 10/30/2017   Degenerative scoliosis 10/30/2017   Essential hypertension 09/10/2020   Fatigue 04/13/2014   Generalized anxiety disorder 09/22/2019   History of asthma 07/21/2013   History of cervical dysplasia 12/22/2021   History of kidney stones 2013   History of lumbar spinal fusion 01/16/2019   Hypercholesterolemia 09/22/2019   Hyperlipemia    Hypothyroidism    past hx   Leg weakness, bilateral 11/28/2017   Long term (current) use of anticoagulants 10/30/2017   Loss of bladder control 11/28/2017   Lumbar radiculopathy 11/28/2017   Major depressive disorder 09/22/2019   Moderate mitral regurgitation 07/17/2013   NICM (nonischemic cardiomyopathy)    a. Echo (07/17/13):  EF 25-30%, EF subsequently normalized with sinus rhythm (tachycardia mediated CM)   Osteopenia    Overweight 07/18/2019   Sinus bradycardia 08/28/2013   Spondylolisthesis at L4-L5 level 10/30/2017   Past Surgical History:  Procedure Laterality Date   ABDOMINAL HYSTERECTOMY  ~ 2001   ABLATION   10/28/13   PVI by Dr Johney Frame   APPENDECTOMY  1978   ATRIAL FIBRILLATION ABLATION N/A 10/28/2013   Procedure: ATRIAL FIBRILLATION ABLATION;  Surgeon: Gardiner Rhyme, MD;  Location: [facility] CATH LAB;  Service: Cardiovascular;  Laterality: N/A;   ATRIAL FIBRILLATION ABLATION N/A 11/23/2020   Procedure: ATRIAL FIBRILLATION ABLATION;  Surgeon: Hillis Range, MD;  Location: [facility] INVASIVE CV LAB;  Service: Cardiovascular;  Laterality: N/A;   BACK SURGERY Bilateral 2019   Fusion   CARDIAC CATHETERIZATION  07/2013   CARDIOVERSION N/A 08/18/2013   Procedure: CARDIOVERSION;  Surgeon: Donato Schultz, MD;  Location: [facility] ENDOSCOPY;  Service: Cardiovascular;  Laterality: N/A;   CARDIOVERSION N/A 08/29/2013   Procedure: CARDIOVERSION;  Surgeon: Pricilla Riffle, MD;  Location: [facility] OR;  Service: Cardiovascular;  Laterality: N/A;   ELECTROPHYSIOLOGIC STUDY N/A 11/05/2014   Procedure: Atrial Fibrillation Ablation;  Surgeon: Hillis Range, MD;  Location: [facility] INVASIVE CV LAB;  Service: Cardiovascular;  Laterality: N/A;   EYE SURGERY Bilateral 1999   Corrective laser surgery   implantable loop recorder removal  10/02/2018   MDT Reveal LINQ removed in office by Dr Johney Frame for EOL battery   IR SACROPLASTY BILATERAL  02/06/2019   LEFT HEART CATHETERIZATION WITH CORONARY ANGIOGRAM N/A 07/17/2013   Procedure: LEFT HEART CATHETERIZATION WITH CORONARY ANGIOGRAM;  Surgeon: Micheline Chapman, MD;  Location: [facility] CATH  LAB;  Service: Cardiovascular;  Laterality: N/A;   LOOP RECORDER IMPLANT N/A 06/05/2014   Procedure: LOOP RECORDER IMPLANT;  Surgeon: Hillis Range, MD;  Location: [facility] CATH LAB;  Service: Cardiovascular;  Laterality: N/A;   TEE WITHOUT CARDIOVERSION N/A 07/18/2013   Procedure: TRANSESOPHAGEAL ECHOCARDIOGRAM (TEE);  Surgeon: Lewayne Bunting, MD;  Location: [facility] ENDOSCOPY;  Service: Cardiovascular;  Laterality: N/A;   TEE WITHOUT CARDIOVERSION N/A 08/18/2013   Procedure: TRANSESOPHAGEAL ECHOCARDIOGRAM (TEE);  Surgeon: Donato Schultz, MD;  Location:  [facility] ENDOSCOPY;  Service: Cardiovascular;  Laterality: N/A;   TEE WITHOUT CARDIOVERSION N/A 10/27/2013   Procedure: TRANSESOPHAGEAL ECHOCARDIOGRAM (TEE);  Surgeon: Pricilla Riffle, MD;  Location: [facility] ENDOSCOPY;  Service: Cardiovascular;  Laterality: N/A;   TEE WITHOUT CARDIOVERSION N/A 11/04/2014   Procedure: TRANSESOPHAGEAL ECHOCARDIOGRAM (TEE);  Surgeon: Chrystie Nose, MD;  Location: [facility] ENDOSCOPY;  Service: Cardiovascular;  Laterality: N/A;   Patient Active Problem List   Diagnosis Date Noted   History of cervical dysplasia    Capsulitis of metatarsophalangeal (MTP) joint of right foot 01/31/2021   Essential hypertension 09/10/2020   Generalized anxiety disorder 09/22/2019   Hypercholesterolemia 09/22/2019   Major depressive disorder 09/22/2019   Overweight 07/18/2019   History of lumbar spinal fusion 01/16/2019   Leg weakness, bilateral 11/28/2017   Loss of bladder control 11/28/2017   Lumbar radiculopathy 11/28/2017   Degenerative disc disease, lumbar 10/30/2017   Degenerative scoliosis 10/30/2017   Long term (current) use of anticoagulants 10/30/2017   NICM (nonischemic cardiomyopathy) 10/30/2017   Spondylolisthesis at L4-L5 level 10/30/2017   Fatigue 04/13/2014   Sinus bradycardia 08/28/2013   Coronary artery disease    Chronic systolic heart failure    Atrial fibrillation    History of asthma 07/21/2013   Moderate mitral regurgitation 07/17/2013    PCP: [name], MD  REFERRING PROVIDER: Tia Alert, MD  REFERRING DIAG: L lumbar radiculopathy  Rationale for Evaluation and Treatment: Rehabilitation  THERAPY DIAG:  Left lumbar radiculopathy  Unsteady gait when walking  Weakness of both hips  ONSET DATE: 3 weeks  SUBJECTIVE:                                                                                                                                                                                           SUBJECTIVE STATEMENT: Pt still concerned about  balance, also about how to get up and down from floor.  Eval: My balance has been really off for a long time so I have been wanting to get PT , this pain in my L leg has been there for about 3 weeks.  It is a similar pain  to the pain prior to my other lumbar fusion surgeries  PERTINENT HISTORY:  H/o previous L 2/3 , 3/4, 4/5 fusions  PAIN:  Are you having pain? Yes: NPRS scale: 6/10 Pain location: L lower back and lateral thigh, just below L knee Pain description: pain with positioning, prolonged sitting Aggravating factors: sitting Relieving factors: resting  PRECAUTIONS: None  RED FLAGS: None   WEIGHT BEARING RESTRICTIONS: No  FALLS:  Has patient fallen in last 6 months? Yes. Number of falls 2  LIVING ENVIRONMENT: Lives with: lives with their spouse Lives in: House/apartment  OCCUPATION: disabled  PLOF: Independent and Needs assistance with homemaking  PATIENT GOALS: to get my balance better and be able to walk and keep up with my husband and daughter  NEXT MD VISIT: 10/05/22  OBJECTIVE:   DIAGNOSTIC FINDINGS:  None performed recently  PATIENT SURVEYS:  Modified Oswestry 25/50   SCREENING FOR RED FLAGS: Bowel or bladder incontinence: No Spinal tumors: No Cauda equina syndrome: No Compression fracture: No Abdominal aneurysm: No  COGNITION: Overall cognitive status: Within functional limits for tasks assessed     SENSATION: WFL  MUSCLE LENGTH: Prone knee bend with restricted B to 90 degrees knee flexion POSTURE:  L lateral pelvic shift.  Elevated L iliac crest 1 cm, flattened LS spine   PALPATION: Point tender along L iliac crest at origin of gluteal musculature  LUMBAR ROM: NT in standing due to balance deficits, fall risk   LOWER EXTREMITY ROM:     AAROM  Right eval Left eval  Hip flexion 90 90  Hip extension -10 -10  Hip abduction    Hip adduction    Hip internal rotation    Hip external rotation    Knee flexion    Knee extension     Ankle dorsiflexion    Ankle plantarflexion    Ankle inversion    Ankle eversion     (Blank rows = wfl)  LOWER EXTREMITY MMT:    MMT Right eval Left eval  Hip flexion    Hip extension lock bridge  4' off mat 1" off mat  Hip abduction 4 3+  Hip adduction    Hip internal rotation    Hip external rotation 4 3+  Knee flexion    Knee extension    Ankle dorsiflexion 3+ 3+  Ankle plantarflexion 3+ 3+  Ankle inversion    Ankle eversion     (Blank rows = not tested)  LUMBAR SPECIAL TESTS:  Straight leg raise test: Positiveon L at 40 degrees for lateral L hip pain  FUNCTIONAL TESTS:  30 seconds chair stand test 8 3 position standing: feet together over one min safely, tandem 30 sec with sway and close SBA needed, unilateral standing 3 sec each  GAIT: Distance walked: walked within clinic 46' at a time.  No device, did note that she weaved frequently, lost her balance when walking to check out and had to brace on wall to stabilize.   TODAY'S TREATMENT:  DATE: 10/19/22 Recumbent bike level 4 x 7 min Supine for stretching L hip with overpressure from therapist, in figure 4 position for ER and for pirformis with hip flexion Heel toe walking along counter, 8' x 5 reps Standing semitandem with arm movements B arms for/back overhead, rainbow arc, reaching side to side B knee hamstring curls 25# 30 reps B knee ext 10# 30 reps  Pt instruction in : Floor transfer, from kneeling to 1/2 kneeling to upright, using floor and knee to move upright, also with support of mat table Patient attempted with R and L leg leading for  1/2 kneeling and was unable leading with L Leg.  Instruction in golfer's lifts pivoting on R LE Instruction in lifting box from floor, instructed in maintaining object close to trunk to reduce torque on spine     10/17/22 Nustep L5x85min Prone  hip extension bil x 12  S/L hip abduction x 12 bil  STM to L glute med/min Passive stretch into ER and IR with mobilization  10/11/22 Nustep L6x31min Clocks 12 o'clock to 6 o'clock bil 5x Standing on airex head turns x 10 fixed gaze Standing on airex trunk rotations x 10 Toe taps from airex x 10 bil STM to L lumbar paraspinals, upper glutes  10/09/22 Bike L4x68min 19/24 DGI Toe taps to yoga block: Fwd x 10 bil Lateral x 10 bil Tandem walk 4x at counter down and back Fwd step and reach unilateral x 10 bil  10/05/22:  Standing for penguins with green t band around thighs x 10  Standing for forward backward rocks with green t band around thighs x 10  Heel/toe rocks on airex  Static standing on airex, horizontal head turns Forward step ups 15 x each leg to 6" Leg press 20# B LE's 3 x 12 Seated hamstring curls, 25#, 3 x 10   10/03/22 Bike L1x52min Fwd step ups 6' x 10 bil Lateral step ups x 10 bil Runner balance x 10 bil intermittent support Kettle bell swing no weight  x 10  Leg press 20# 2x15 Figure 4 stretch 2 x 30 sec bil Hooklying LTR x 10 bil  09/29/22 Nustep L5x88min  Standing for penguins with green t band around thighs x 10  Standing for forward backward rocks with green t band around thighs x 10  Semi tandem in corner, head turns with gaze stability in corner Knee flexion 20# 2x10 Knee extension 10# 2x10 Seated hip ER x 10   09/26/22: Neuromuscular reed:  addressed balance deficits:  Completed DGI Added standing in corner , semi tandem standing, with head turns  Reviewed heel toe rocks, patient needs counter to maintain balance  Therex: instructed in the following ex to build her strength, stability, also motor control, coordination, focused primarily on hip musculture   Standing for penguins with green t band around thighs  Standing for forward backward rocks with green t band around thighs  BATCA B knee extension 15 reps, 2 sets  BATCA B knee flexion 15  reps, 2 sets  Nustep:  level 5, Ue's and LE's  09/07/22: Therex:  Inst in heel toe rocks , next to sink at home, at intervals throughout the day, to improve righting reactions, balance Inst in seated theraband hip abd/ER , black, to perform 15 x 2 -3 x day for motor control /strength B hip abductors    PATIENT EDUCATION:  Education details: HEP update- see below Person educated: Patient Education method: Explanation, Demonstration, Tactile cues, and Verbal cues  Education comprehension: verbalized understanding, returned demonstration, and verbal cues required  HOME EXERCISE PROGRAM: Access Code: [email or web address] URL: [email or web address] Date: 10/17/2022 Prepared by: Verta Ellen  Exercises - Standing 4-Way Leg Reach with Counter Support  - 1 x daily - 7 x weekly - 3 sets - 10 reps - Sidelying Hip Abduction  - 1 x daily - 7 x weekly - 2 sets - 10 reps - Prone Hip Extension  - 1 x daily - 7 x weekly - 2 sets - 10 reps  ASSESSMENT:  CLINICAL IMPRESSION: Pt overall feeling well, had an incident this weekend in which she had to get on a floor to find a remote control and she had difficulty getting back up.  Therefore instructed her in various floor transfer techniques, also in lifting mechanics for jt protection lumbar sine long term.  She continues to show potential for improvement and would continue to benefit from skilled therapy.  OBJECTIVE IMPAIRMENTS: decreased activity tolerance, decreased balance, decreased coordination, decreased endurance, difficulty walking, decreased ROM, decreased strength, postural dysfunction, and pain.   ACTIVITY LIMITATIONS: carrying, lifting, bending, squatting, sleeping, transfers, and locomotion level  PARTICIPATION LIMITATIONS: cleaning, laundry, shopping, community activity, and yard work  PERSONAL FACTORS: Behavior pattern, Fitness, Past/current experiences, Time since onset of injury/illness/exacerbation, and 1-2 comorbidities: h/o  afib, h/o LS fusion, osteopenia, h/o B foot fractures  are also affecting patient's functional outcome.   REHAB POTENTIAL: Good  CLINICAL DECISION MAKING: Evolving/moderate complexity  EVALUATION COMPLEXITY: Moderate   GOALS: Goals reviewed with patient? Yes  SHORT TERM GOALS: Target date: 2 weeks 09/21/22  I HEP Baseline: Goal status: MET- 10/03/22  LONG TERM GOALS: Target date: 11/30/22  Improve 30 sec sit to stand from 8 reps to 12 reps  Baseline:  Goal status: INITIAL  2.  Improve balance, DGI 22/24 Baseline: TBD not assessed at initial eval 09/26/22: 14/24 Goal status: Progressing- 10/09/22  3.  Reduce L radicular pain to 50% less frequent and less intensity Baseline: constant Goal status: INITIAL  4.  Improve B hip flexion ROM to 110 each for improved sitting tolerance, improved mobility sit to stand Baseline: 90 degrees B Goal status: INITIAL  5.  Improve hip abd, ext  strength and plantar flexor strength to 4/5 Baseline: 3+/5 to 4-/5 Goal status: INITIAL  PLAN:  PT FREQUENCY: 2x/week  PT DURATION: 12 weeks  PLANNED INTERVENTIONS: Therapeutic exercises, Therapeutic activity, Neuromuscular re-education, Balance training, Gait training, Patient/Family education, Self Care, and Joint mobilization.  PLAN FOR NEXT SESSION:  cardiovascular training on recumbent machines, manual stretching B hips, additional therex to address strength and balance LE's and B hip flexibility.   Yasseen Salls L Lisbeth Puller, PT, DPT, OCS 10/19/2022, 4:50 PM

## 2022-10-24 ENCOUNTER — Telehealth: Payer: Self-pay | Admitting: *Deleted

## 2022-10-24 ENCOUNTER — Ambulatory Visit: Payer: 59

## 2022-10-24 DIAGNOSIS — R29898 Other symptoms and signs involving the musculoskeletal system: Secondary | ICD-10-CM

## 2022-10-24 DIAGNOSIS — M5416 Radiculopathy, lumbar region: Secondary | ICD-10-CM

## 2022-10-24 DIAGNOSIS — R2681 Unsteadiness on feet: Secondary | ICD-10-CM

## 2022-10-24 NOTE — Telephone Encounter (Signed)
   Pre-operative Risk Assessment    Patient Name: Kiara [name]  DOB: February 17, 1960 MRN: [id number]      Request for Surgical Clearance    Procedure:   Bilateral L5 -S1 TFESI  Date of Surgery:  Clearance TBD                                 Surgeon:  Dr. Aileen Fass Surgeon's Group or Practice Name:  [facility]  Phone number:  [phone] Fax number:  [phone]   Type of Clearance Requested:   - Medical  - Pharmacy:  Hold Apixaban (Eliquis) 3 days prior and resume day after.    Type of Anesthesia:  Not Indicated   Additional requests/questions:    Signed, Emmit Pomfret   10/24/2022, 2:19 PM

## 2022-10-24 NOTE — Telephone Encounter (Signed)
Pharmacy please advise on holding Eliquis prior to  Bilateral L5 -S1 TFESI scheduled for TBD. Thank you.

## 2022-10-24 NOTE — Therapy (Signed)
OUTPATIENT PHYSICAL THERAPY TREATMENT   Patient Name: Kiara [name] MRN: [id number] DOB:04/07/1960, 62 y.o., female Today's Date: 10/24/2022  END OF SESSION:  PT End of Session - 10/24/22 1018     Visit Number 10    Date for PT Re-Evaluation 11/30/22    Progress Note Due on Visit 10    PT Start Time 0933    PT Stop Time 1016    PT Time Calculation (min) 43 min    Activity Tolerance Patient tolerated treatment well    Behavior During Therapy [facility] for tasks assessed/performed                     Past Medical History:  Diagnosis Date   Atrial fibrillation    a. admx with AFib with RVR and acute systolic CHF 07/2013; TEE with LAA clot and no DCCV done;     Bilateral bunions 12/27/2020   Capsulitis of metatarsophalangeal (MTP) joint of right foot 01/31/2021   Chronic systolic heart failure    Coronary artery disease    a.  LHC (07/17/13):  LM 20%, ostial CFX 20-30%   Degenerative disc disease, lumbar 10/30/2017   Degenerative scoliosis 10/30/2017   Essential hypertension 09/10/2020   Fatigue 04/13/2014   Generalized anxiety disorder 09/22/2019   History of asthma 07/21/2013   History of cervical dysplasia 12/22/2021   History of kidney stones 2013   History of lumbar spinal fusion 01/16/2019   Hypercholesterolemia 09/22/2019   Hyperlipemia    Hypothyroidism    past hx   Leg weakness, bilateral 11/28/2017   Long term (current) use of anticoagulants 10/30/2017   Loss of bladder control 11/28/2017   Lumbar radiculopathy 11/28/2017   Major depressive disorder 09/22/2019   Moderate mitral regurgitation 07/17/2013   NICM (nonischemic cardiomyopathy)    a. Echo (07/17/13):  EF 25-30%, EF subsequently normalized with sinus rhythm (tachycardia mediated CM)   Osteopenia    Overweight 07/18/2019   Sinus bradycardia 08/28/2013   Spondylolisthesis at L4-L5 level 10/30/2017   Past Surgical History:  Procedure Laterality Date   ABDOMINAL HYSTERECTOMY  ~ 2001   ABLATION   10/28/13   PVI by Dr Johney Frame   APPENDECTOMY  1978   ATRIAL FIBRILLATION ABLATION N/A 10/28/2013   Procedure: ATRIAL FIBRILLATION ABLATION;  Surgeon: Gardiner Rhyme, MD;  Location: [facility] CATH LAB;  Service: Cardiovascular;  Laterality: N/A;   ATRIAL FIBRILLATION ABLATION N/A 11/23/2020   Procedure: ATRIAL FIBRILLATION ABLATION;  Surgeon: Hillis Range, MD;  Location: [facility] INVASIVE CV LAB;  Service: Cardiovascular;  Laterality: N/A;   BACK SURGERY Bilateral 2019   Fusion   CARDIAC CATHETERIZATION  07/2013   CARDIOVERSION N/A 08/18/2013   Procedure: CARDIOVERSION;  Surgeon: Donato Schultz, MD;  Location: [facility] ENDOSCOPY;  Service: Cardiovascular;  Laterality: N/A;   CARDIOVERSION N/A 08/29/2013   Procedure: CARDIOVERSION;  Surgeon: Pricilla Riffle, MD;  Location: [facility] OR;  Service: Cardiovascular;  Laterality: N/A;   ELECTROPHYSIOLOGIC STUDY N/A 11/05/2014   Procedure: Atrial Fibrillation Ablation;  Surgeon: Hillis Range, MD;  Location: [facility] INVASIVE CV LAB;  Service: Cardiovascular;  Laterality: N/A;   EYE SURGERY Bilateral 1999   Corrective laser surgery   implantable loop recorder removal  10/02/2018   MDT Reveal LINQ removed in office by Dr Johney Frame for EOL battery   IR SACROPLASTY BILATERAL  02/06/2019   LEFT HEART CATHETERIZATION WITH CORONARY ANGIOGRAM N/A 07/17/2013   Procedure: LEFT HEART CATHETERIZATION WITH CORONARY ANGIOGRAM;  Surgeon: Micheline Chapman, MD;  Location: [facility]  CATH LAB;  Service: Cardiovascular;  Laterality: N/A;   LOOP RECORDER IMPLANT N/A 06/05/2014   Procedure: LOOP RECORDER IMPLANT;  Surgeon: Hillis Range, MD;  Location: [facility] CATH LAB;  Service: Cardiovascular;  Laterality: N/A;   TEE WITHOUT CARDIOVERSION N/A 07/18/2013   Procedure: TRANSESOPHAGEAL ECHOCARDIOGRAM (TEE);  Surgeon: Lewayne Bunting, MD;  Location: [facility] ENDOSCOPY;  Service: Cardiovascular;  Laterality: N/A;   TEE WITHOUT CARDIOVERSION N/A 08/18/2013   Procedure: TRANSESOPHAGEAL ECHOCARDIOGRAM (TEE);  Surgeon: Donato Schultz, MD;  Location:  [facility] ENDOSCOPY;  Service: Cardiovascular;  Laterality: N/A;   TEE WITHOUT CARDIOVERSION N/A 10/27/2013   Procedure: TRANSESOPHAGEAL ECHOCARDIOGRAM (TEE);  Surgeon: Pricilla Riffle, MD;  Location: [facility] ENDOSCOPY;  Service: Cardiovascular;  Laterality: N/A;   TEE WITHOUT CARDIOVERSION N/A 11/04/2014   Procedure: TRANSESOPHAGEAL ECHOCARDIOGRAM (TEE);  Surgeon: Chrystie Nose, MD;  Location: [facility] ENDOSCOPY;  Service: Cardiovascular;  Laterality: N/A;   Patient Active Problem List   Diagnosis Date Noted   History of cervical dysplasia    Capsulitis of metatarsophalangeal (MTP) joint of right foot 01/31/2021   Essential hypertension 09/10/2020   Generalized anxiety disorder 09/22/2019   Hypercholesterolemia 09/22/2019   Major depressive disorder 09/22/2019   Overweight 07/18/2019   History of lumbar spinal fusion 01/16/2019   Leg weakness, bilateral 11/28/2017   Loss of bladder control 11/28/2017   Lumbar radiculopathy 11/28/2017   Degenerative disc disease, lumbar 10/30/2017   Degenerative scoliosis 10/30/2017   Long term (current) use of anticoagulants 10/30/2017   NICM (nonischemic cardiomyopathy) 10/30/2017   Spondylolisthesis at L4-L5 level 10/30/2017   Fatigue 04/13/2014   Sinus bradycardia 08/28/2013   Coronary artery disease    Chronic systolic heart failure    Atrial fibrillation    History of asthma 07/21/2013   Moderate mitral regurgitation 07/17/2013    PCP: [name], MD  REFERRING PROVIDER: Tia Alert, MD  REFERRING DIAG: L lumbar radiculopathy  Rationale for Evaluation and Treatment: Rehabilitation  THERAPY DIAG:  Left lumbar radiculopathy  Unsteady gait when walking  Weakness of both hips  ONSET DATE: 3 weeks  SUBJECTIVE:                                                                                                                                                                                           SUBJECTIVE STATEMENT: Pt over worked this weekend  helping her daughter heavy furniture, increased soreness today.  Eval: My balance has been really off for a long time so I have been wanting to get PT , this pain in my L leg has been there for about 3 weeks.  It is a similar pain to  the pain prior to my other lumbar fusion surgeries  PERTINENT HISTORY:  H/o previous L 2/3 , 3/4, 4/5 fusions  PAIN:  Are you having pain? Yes: NPRS scale: 7/10 Pain location: L lower back and lateral thigh, just below L knee Pain description: pain with positioning, prolonged sitting Aggravating factors: sitting Relieving factors: resting  PRECAUTIONS: None  RED FLAGS: None   WEIGHT BEARING RESTRICTIONS: No  FALLS:  Has patient fallen in last 6 months? Yes. Number of falls 2  LIVING ENVIRONMENT: Lives with: lives with their spouse Lives in: House/apartment  OCCUPATION: disabled  PLOF: Independent and Needs assistance with homemaking  PATIENT GOALS: to get my balance better and be able to walk and keep up with my husband and daughter  NEXT MD VISIT: 10/05/22  OBJECTIVE:   DIAGNOSTIC FINDINGS:  None performed recently  PATIENT SURVEYS:  Modified Oswestry 25/50   SCREENING FOR RED FLAGS: Bowel or bladder incontinence: No Spinal tumors: No Cauda equina syndrome: No Compression fracture: No Abdominal aneurysm: No  COGNITION: Overall cognitive status: Within functional limits for tasks assessed     SENSATION: WFL  MUSCLE LENGTH: Prone knee bend with restricted B to 90 degrees knee flexion POSTURE:  L lateral pelvic shift.  Elevated L iliac crest 1 cm, flattened LS spine   PALPATION: Point tender along L iliac crest at origin of gluteal musculature  LUMBAR ROM: NT in standing due to balance deficits, fall risk   LOWER EXTREMITY ROM:     AAROM  Right eval Left eval R/L 10/24/22  Hip flexion 90 90 100/105  Hip extension -10 -10   Hip abduction     Hip adduction     Hip internal rotation     Hip external rotation      Knee flexion     Knee extension     Ankle dorsiflexion     Ankle plantarflexion     Ankle inversion     Ankle eversion      (Blank rows = wfl)  LOWER EXTREMITY MMT:    MMT Right eval Left eval R 10/24/22 L 10/24/22  Hip flexion      Hip extension lock bridge  4' off mat 1" off mat 4- 4-  Hip abduction 4 3+ 4 4-  Hip adduction      Hip internal rotation      Hip external rotation 4 3+    Knee flexion      Knee extension      Ankle dorsiflexion 3+ 3+    Ankle plantarflexion 3+ 3+    Ankle inversion      Ankle eversion       (Blank rows = not tested)  LUMBAR SPECIAL TESTS:  Straight leg raise test: Positiveon L at 40 degrees for lateral L hip pain  FUNCTIONAL TESTS:  30 seconds chair stand test 8 3 position standing: feet together over one min safely, tandem 30 sec with sway and close SBA needed, unilateral standing 3 sec each  GAIT: Distance walked: walked within clinic 45' at a time.  No device, did note that she weaved frequently, lost her balance when walking to check out and had to brace on wall to stabilize.   TODAY'S TREATMENT:  DATE: 10/24/22 Rec Bike L3x53min Supine KTOS stretch 2x30 sec bil Supine figure 4 stretch 2x30 sec bil Supine bridge 2x10  Therapeutic Activity: Assessment of LTGs for progress  10/19/22 Recumbent bike level 4 x 7 min Supine for stretching L hip with overpressure from therapist, in figure 4 position for ER and for pirformis with hip flexion Heel toe walking along counter, 8' x 5 reps Standing semitandem with arm movements B arms for/back overhead, rainbow arc, reaching side to side B knee hamstring curls 25# 30 reps B knee ext 10# 30 reps  Pt instruction in : Floor transfer, from kneeling to 1/2 kneeling to upright, using floor and knee to move upright, also with support of mat table Patient attempted with R  and L leg leading for  1/2 kneeling and was unable leading with L Leg.  Instruction in golfer's lifts pivoting on R LE Instruction in lifting box from floor, instructed in maintaining object close to trunk to reduce torque on spine     10/17/22 Nustep L5x29min Prone hip extension bil x 12  S/L hip abduction x 12 bil  STM to L glute med/min Passive stretch into ER and IR with mobilization  10/11/22 Nustep L6x62min Clocks 12 o'clock to 6 o'clock bil 5x Standing on airex head turns x 10 fixed gaze Standing on airex trunk rotations x 10 Toe taps from airex x 10 bil STM to L lumbar paraspinals, upper glutes  10/09/22 Bike L4x1min 19/24 DGI Toe taps to yoga block: Fwd x 10 bil Lateral x 10 bil Tandem walk 4x at counter down and back Fwd step and reach unilateral x 10 bil  10/05/22:  Standing for penguins with green t band around thighs x 10  Standing for forward backward rocks with green t band around thighs x 10  Heel/toe rocks on airex  Static standing on airex, horizontal head turns Forward step ups 15 x each leg to 6" Leg press 20# B LE's 3 x 12 Seated hamstring curls, 25#, 3 x 10   10/03/22 Bike L1x24min Fwd step ups 6' x 10 bil Lateral step ups x 10 bil Runner balance x 10 bil intermittent support Kettle bell swing no weight  x 10  Leg press 20# 2x15 Figure 4 stretch 2 x 30 sec bil Hooklying LTR x 10 bil  09/29/22 Nustep L5x27min  Standing for penguins with green t band around thighs x 10  Standing for forward backward rocks with green t band around thighs x 10  Semi tandem in corner, head turns with gaze stability in corner Knee flexion 20# 2x10 Knee extension 10# 2x10 Seated hip ER x 10   09/26/22: Neuromuscular reed:  addressed balance deficits:  Completed DGI Added standing in corner , semi tandem standing, with head turns  Reviewed heel toe rocks, patient needs counter to maintain balance  Therex: instructed in the following ex to build her strength,  stability, also motor control, coordination, focused primarily on hip musculture   Standing for penguins with green t band around thighs  Standing for forward backward rocks with green t band around thighs  BATCA B knee extension 15 reps, 2 sets  BATCA B knee flexion 15 reps, 2 sets  Nustep:  level 5, Ue's and LE's  09/07/22: Therex:  Inst in heel toe rocks , next to sink at home, at intervals throughout the day, to improve righting reactions, balance Inst in seated theraband hip abd/ER , black, to perform 15 x 2 -3 x day for motor  control /strength B hip abductors    PATIENT EDUCATION:  Education details: HEP update- see below Person educated: Patient Education method: Explanation, Demonstration, Tactile cues, and Verbal cues Education comprehension: verbalized understanding, returned demonstration, and verbal cues required  HOME EXERCISE PROGRAM: Access Code: [email or web address] URL: [email or web address] Date: 10/17/2022 Prepared by: Verta Ellen  Exercises - Standing 4-Way Leg Reach with Counter Support  - 1 x daily - 7 x weekly - 3 sets - 10 reps - Sidelying Hip Abduction  - 1 x daily - 7 x weekly - 2 sets - 10 reps - Prone Hip Extension  - 1 x daily - 7 x weekly - 2 sets - 10 reps  ASSESSMENT:  CLINICAL IMPRESSION: Pt shows improved B hip flexion and LE strength based on objective measurements. She still has radicular pain but today it was more centered in the buttock and into proximal hamstrings. Exercises focused more on low back and hips with good response from patient. She is showing improvement with goals and would continue to benefit from skilled therapy to continue addressing strength, ROM, and other goals to restore function.  OBJECTIVE IMPAIRMENTS: decreased activity tolerance, decreased balance, decreased coordination, decreased endurance, difficulty walking, decreased ROM, decreased strength, postural dysfunction, and pain.   ACTIVITY LIMITATIONS: carrying,  lifting, bending, squatting, sleeping, transfers, and locomotion level  PARTICIPATION LIMITATIONS: cleaning, laundry, shopping, community activity, and yard work  PERSONAL FACTORS: Behavior pattern, Fitness, Past/current experiences, Time since onset of injury/illness/exacerbation, and 1-2 comorbidities: h/o afib, h/o LS fusion, osteopenia, h/o B foot fractures  are also affecting patient's functional outcome.   REHAB POTENTIAL: Good  CLINICAL DECISION MAKING: Evolving/moderate complexity  EVALUATION COMPLEXITY: Moderate   GOALS: Goals reviewed with patient? Yes  SHORT TERM GOALS: Target date: 2 weeks 09/21/22  I HEP Baseline: Goal status: MET- 10/03/22  LONG TERM GOALS: Target date: 11/30/22  Improve 30 sec sit to stand from 8 reps to 12 reps  Baseline:  Goal status: INITIAL  2.  Improve balance, DGI 22/24 Baseline: TBD not assessed at initial eval 09/26/22: 14/24 Goal status: Progressing- 10/09/22  3.  Reduce L radicular pain to 50% less frequent and less intensity Baseline: constant Goal status: PROGRESSING- 10/24/22  4.  Improve B hip flexion ROM to 110 each for improved sitting tolerance, improved mobility sit to stand Baseline: 90 degrees B Goal status: PROGRESSING- 10/24/22 see chart  5.  Improve hip abd, ext  strength and plantar flexor strength to 4/5 Baseline: 3+/5 to 4-/5 Goal status: PROGRESSING- 10/24/22- see chart  PLAN:  PT FREQUENCY: 2x/week  PT DURATION: 12 weeks  PLANNED INTERVENTIONS: Therapeutic exercises, Therapeutic activity, Neuromuscular re-education, Balance training, Gait training, Patient/Family education, Self Care, and Joint mobilization.  PLAN FOR NEXT SESSION:  cardiovascular training on recumbent machines, manual stretching B hips, additional therex to address strength and balance LE's and B hip flexibility.   Darleene Cleaver, PTA 10/24/2022, 10:18 AM

## 2022-10-25 ENCOUNTER — Telehealth: Payer: Self-pay | Admitting: *Deleted

## 2022-10-25 ENCOUNTER — Encounter: Payer: Self-pay | Admitting: Behavioral Health

## 2022-10-25 ENCOUNTER — Ambulatory Visit (INDEPENDENT_AMBULATORY_CARE_PROVIDER_SITE_OTHER): Payer: 59 | Admitting: Behavioral Health

## 2022-10-25 DIAGNOSIS — F411 Generalized anxiety disorder: Secondary | ICD-10-CM

## 2022-10-25 DIAGNOSIS — F4321 Adjustment disorder with depressed mood: Secondary | ICD-10-CM

## 2022-10-25 DIAGNOSIS — F4381 Prolonged grief disorder: Secondary | ICD-10-CM | POA: Diagnosis not present

## 2022-10-25 NOTE — Telephone Encounter (Signed)
Patient with diagnosis of A Fib on Eliquis for anticoagulation.    Procedure: Bilateral L5 -S1 TFES  Date of procedure: TBD   CHA2DS2-VASc Score = 4  This indicates a 4.8% annual risk of stroke. The patient's score is based upon: CHF History: 1 HTN History: 1 Diabetes History: 0 Stroke History: 0 Vascular Disease History: 1 Age Score: 0 Gender Score: 1   CrCl 102 mL/min Platelet count 200K   Per office protocol, patient can hold Eliquis for 3 days prior to procedure.    Patient will not need bridging with Lovenox (enoxaparin) around procedure.  **This guidance is not considered finalized until pre-operative APP has relayed final recommendations.**

## 2022-10-25 NOTE — Telephone Encounter (Signed)
   Name: Kiara [name]  DOB: 05/10/1960  MRN: [id number]  Primary Cardiologist: Hillis Range, MD (Inactive)   Preoperative team, please contact this patient and set up a phone call appointment for further preoperative risk assessment. Please obtain consent and complete medication review. Thank you for your help.  Patient with diagnosis of A Fib on Eliquis for anticoagulation.     Procedure: Bilateral L5 -S1 TFES  Date of procedure: TBD     CHA2DS2-VASc Score = 4  This indicates a 4.8% annual risk of stroke. The patient's score is based upon: CHF History: 1 HTN History: 1 Diabetes History: 0 Stroke History: 0 Vascular Disease History: 1 Age Score: 0 Gender Score: 1   CrCl 102 mL/min Platelet count 200K     Per office protocol, patient can hold Eliquis for 3 days prior to procedure.     Patient will not need bridging with Lovenox (enoxaparin) around procedure.  I confirm that guidance regarding antiplatelet and oral anticoagulation therapy has been completed and, if necessary, noted below.     Ronney Asters, NP 10/25/2022, 8:47 AM [facility]
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
## 2022-10-25 NOTE — Telephone Encounter (Signed)
Pt has been scheduled for tele pre op appt 11/07/22 @ 9:20. Med rec and consent are done.

## 2022-10-25 NOTE — Telephone Encounter (Signed)
Pt has been scheduled for tele pre op appt 11/07/22 @ 9:20. Med rec and consent are done.     Patient Consent for Virtual Visit        Kiara [name] has provided verbal consent on 10/25/2022 for a virtual visit (video or telephone).   CONSENT FOR VIRTUAL VISIT FOR:  Kiara [name]  By participating in this virtual visit I agree to the following:  I hereby voluntarily request, consent and authorize [facility] and its employed or contracted physicians, physician assistants, nurse practitioners or other licensed health care professionals (the Practitioner), to provide me with telemedicine health care services (the "Services") as deemed necessary by the treating Practitioner. I acknowledge and consent to receive the Services by the Practitioner via telemedicine. I understand that the telemedicine visit will involve communicating with the Practitioner through live audiovisual communication technology and the disclosure of certain medical information by electronic transmission. I acknowledge that I have been given the opportunity to request an in-person assessment or other available alternative prior to the telemedicine visit and am voluntarily participating in the telemedicine visit.  I understand that I have the right to withhold or withdraw my consent to the use of telemedicine in the course of my care at any time, without affecting my right to future care or treatment, and that the Practitioner or I may terminate the telemedicine visit at any time. I understand that I have the right to inspect all information obtained and/or recorded in the course of the telemedicine visit and may receive copies of available information for a reasonable fee.  I understand that some of the potential risks of receiving the Services via telemedicine include:  Delay or interruption in medical evaluation due to technological equipment failure or disruption; Information transmitted may not be sufficient (e.g.  poor resolution of images) to allow for appropriate medical decision making by the Practitioner; and/or  In rare instances, security protocols could fail, causing a breach of personal health information.  Furthermore, I acknowledge that it is my responsibility to provide information about my medical history, conditions and care that is complete and accurate to the best of my ability. I acknowledge that Practitioner's advice, recommendations, and/or decision may be based on factors not within their control, such as incomplete or inaccurate data provided by me or distortions of diagnostic images or specimens that may result from electronic transmissions. I understand that the practice of medicine is not an exact science and that Practitioner makes no warranties or guarantees regarding treatment outcomes. I acknowledge that a copy of this consent can be made available to me via my patient portal [facility] MyChart), or I can request a printed copy by calling the office of [facility].    I understand that my insurance will be billed for this visit.   I have read or had this consent read to me. I understand the contents of this consent, which adequately explains the benefits and risks of the Services being provided via telemedicine.  I have been provided ample opportunity to ask questions regarding this consent and the Services and have had my questions answered to my satisfaction. I give my informed consent for the services to be provided through the use of telemedicine in my medical care

## 2022-10-26 ENCOUNTER — Other Ambulatory Visit: Payer: Self-pay

## 2022-10-26 ENCOUNTER — Ambulatory Visit: Payer: 59

## 2022-10-26 DIAGNOSIS — M5416 Radiculopathy, lumbar region: Secondary | ICD-10-CM | POA: Diagnosis not present

## 2022-10-26 DIAGNOSIS — R2681 Unsteadiness on feet: Secondary | ICD-10-CM

## 2022-10-26 DIAGNOSIS — R29898 Other symptoms and signs involving the musculoskeletal system: Secondary | ICD-10-CM

## 2022-10-26 NOTE — Therapy (Signed)
OUTPATIENT PHYSICAL THERAPY TREATMENT   Patient Name: Kiara [name] MRN: [id number] DOB:[birth year], 62 y.o., female Today's Date: 10/26/2022  END OF SESSION:  PT End of Session - 10/26/22 0936     Visit Number 11    Date for PT Re-Evaluation 11/30/22    Progress Note Due on Visit 20    PT Start Time 0933                      Past Medical History:  Diagnosis Date   Atrial fibrillation    a. admx with AFib with RVR and acute systolic CHF 07/2013; TEE with LAA clot and no DCCV done;     Bilateral bunions 12/27/2020   Capsulitis of metatarsophalangeal (MTP) joint of right foot 01/31/2021   Chronic systolic heart failure    Coronary artery disease    a.  LHC (07/17/13):  LM 20%, ostial CFX 20-30%   Degenerative disc disease, lumbar 10/30/2017   Degenerative scoliosis 10/30/2017   Essential hypertension 09/10/2020   Fatigue 04/13/2014   Generalized anxiety disorder 09/22/2019   History of asthma 07/21/2013   History of cervical dysplasia 12/22/2021   History of kidney stones 2013   History of lumbar spinal fusion 01/16/2019   Hypercholesterolemia 09/22/2019   Hyperlipemia    Hypothyroidism    past hx   Leg weakness, bilateral 11/28/2017   Long term (current) use of anticoagulants 10/30/2017   Loss of bladder control 11/28/2017   Lumbar radiculopathy 11/28/2017   Major depressive disorder 09/22/2019   Moderate mitral regurgitation 07/17/2013   NICM (nonischemic cardiomyopathy)    a. Echo (07/17/13):  EF 25-30%, EF subsequently normalized with sinus rhythm (tachycardia mediated CM)   Osteopenia    Overweight 07/18/2019   Sinus bradycardia 08/28/2013   Spondylolisthesis at L4-L5 level 10/30/2017   Past Surgical History:  Procedure Laterality Date   ABDOMINAL HYSTERECTOMY  ~ 2001   ABLATION  10/28/13   PVI by Dr Johney Frame   APPENDECTOMY  1978   ATRIAL FIBRILLATION ABLATION N/A 10/28/2013   Procedure: ATRIAL FIBRILLATION ABLATION;  Surgeon: Gardiner Rhyme, MD;   Location: [facility] CATH LAB;  Service: Cardiovascular;  Laterality: N/A;   ATRIAL FIBRILLATION ABLATION N/A 11/23/2020   Procedure: ATRIAL FIBRILLATION ABLATION;  Surgeon: Hillis Range, MD;  Location: [facility] INVASIVE CV LAB;  Service: Cardiovascular;  Laterality: N/A;   BACK SURGERY Bilateral 2019   Fusion   CARDIAC CATHETERIZATION  07/2013   CARDIOVERSION N/A 08/18/2013   Procedure: CARDIOVERSION;  Surgeon: Donato Schultz, MD;  Location[facility] ENDOSCOPY;  Service: Cardiovascular;  Laterality: N/A;   CARDIOVERSION N/A 08/29/2013   Procedure: CARDIOVERSION;  Surgeon: Pricilla Riffle, MD;  Location: [facility] OR;  Service: Cardiovascular;  Laterality: N/A;   ELECTROPHYSIOLOGIC STUDY N/A 11/05/2014   Procedure: Atrial Fibrillation Ablation;  Surgeon: Hillis Range, MD;  Location: [facility] INVASIVE CV LAB;  Service: Cardiovascular;  Laterality: N/A;   EYE SURGERY Bilateral 1999   Corrective laser surgery   implantable loop recorder removal  10/02/2018   MDT Reveal LINQ removed in office by Dr Johney Frame for EOL battery   IR SACROPLASTY BILATERAL  02/06/2019   LEFT HEART CATHETERIZATION WITH CORONARY ANGIOGRAM N/A 07/17/2013   Procedure: LEFT HEART CATHETERIZATION WITH CORONARY ANGIOGRAM;  Surgeon: Micheline Chapman, MD;  Location: [facility] CATH LAB;  Service: Cardiovascular;  Laterality: N/A;   LOOP RECORDER IMPLANT N/A 06/05/2014   Procedure: LOOP RECORDER IMPLANT;  Surgeon: Hillis Range, MD;  Location: [facility] CATH LAB;  Service: Cardiovascular;  Laterality: N/A;   TEE WITHOUT CARDIOVERSION N/A 07/18/2013   Procedure: TRANSESOPHAGEAL ECHOCARDIOGRAM (TEE);  Surgeon: Lewayne Bunting, MD;  Location: [facility] ENDOSCOPY;  Service: Cardiovascular;  Laterality: N/A;   TEE WITHOUT CARDIOVERSION N/A 08/18/2013   Procedure: TRANSESOPHAGEAL ECHOCARDIOGRAM (TEE);  Surgeon: Donato Schultz, MD;  Location: [facility] ENDOSCOPY;  Service: Cardiovascular;  Laterality: N/A;   TEE WITHOUT CARDIOVERSION N/A 10/27/2013   Procedure: TRANSESOPHAGEAL ECHOCARDIOGRAM (TEE);  Surgeon: Pricilla Riffle, MD;  Location: [facility] ENDOSCOPY;  Service: Cardiovascular;  Laterality: N/A;   TEE WITHOUT CARDIOVERSION N/A 11/04/2014   Procedure: TRANSESOPHAGEAL ECHOCARDIOGRAM (TEE);  Surgeon: Chrystie Nose, MD;  Location: [facility] ENDOSCOPY;  Service: Cardiovascular;  Laterality: N/A;   Patient Active Problem List   Diagnosis Date Noted   History of cervical dysplasia    Capsulitis of metatarsophalangeal (MTP) joint of right foot 01/31/2021   Essential hypertension 09/10/2020   Generalized anxiety disorder 09/22/2019   Hypercholesterolemia 09/22/2019   Major depressive disorder 09/22/2019   Overweight 07/18/2019   History of lumbar spinal fusion 01/16/2019   Leg weakness, bilateral 11/28/2017   Loss of bladder control 11/28/2017   Lumbar radiculopathy 11/28/2017   Degenerative disc disease, lumbar 10/30/2017   Degenerative scoliosis 10/30/2017   Long term (current) use of anticoagulants 10/30/2017   NICM (nonischemic cardiomyopathy) 10/30/2017   Spondylolisthesis at L4-L5 level 10/30/2017   Fatigue 04/13/2014   Sinus bradycardia 08/28/2013   Coronary artery disease    Chronic systolic heart failure    Atrial fibrillation    History of asthma 07/21/2013   Moderate mitral regurgitation 07/17/2013    PCP: [name], MD  REFERRING PROVIDER: Tia Alert, MD  REFERRING DIAG: L lumbar radiculopathy  Rationale for Evaluation and Treatment: Rehabilitation  THERAPY DIAG:  Left lumbar radiculopathy  Unsteady gait when walking  Weakness of both hips  ONSET DATE: 3 weeks  SUBJECTIVE:                                                                                                                                                                                           SUBJECTIVE STATEMENT: Pt saw neurosurgeon, now is going to have pain injection but ultimately will undergo a fusion L L5/S1, hopefully in October Has been down for 2 days, on sofa, I think I hurt it again when I was  helping my daughter move that dresser.   Eval: My balance has been really off for a long time so I have been wanting to get PT , this pain in my L leg has been there for about 3 weeks.  It is a similar pain to the pain prior  to my other lumbar fusion surgeries  PERTINENT HISTORY:  H/o previous L 2/3 , 3/4, 4/5 fusions  PAIN:  Are you having pain? Yes: NPRS scale: 7/10 Pain location: L lower back and lateral thigh, just below L knee Pain description: pain with positioning, prolonged sitting Aggravating factors: sitting Relieving factors: resting  PRECAUTIONS: None  RED FLAGS: None   WEIGHT BEARING RESTRICTIONS: No  FALLS:  Has patient fallen in last 6 months? Yes. Number of falls 2  LIVING ENVIRONMENT: Lives with: lives with their spouse Lives in: House/apartment  OCCUPATION: disabled  PLOF: Independent and Needs assistance with homemaking  PATIENT GOALS: to get my balance better and be able to walk and keep up with my husband and daughter  NEXT MD VISIT: 10/05/22  OBJECTIVE:   DIAGNOSTIC FINDINGS:  None performed recently  PATIENT SURVEYS:  Modified Oswestry 25/50   SCREENING FOR RED FLAGS: Bowel or bladder incontinence: No Spinal tumors: No Cauda equina syndrome: No Compression fracture: No Abdominal aneurysm: No  COGNITION: Overall cognitive status: Within functional limits for tasks assessed     SENSATION: WFL  MUSCLE LENGTH: Prone knee bend with restricted B to 90 degrees knee flexion POSTURE:  L lateral pelvic shift.  Elevated L iliac crest 1 cm, flattened LS spine   PALPATION: Point tender along L iliac crest at origin of gluteal musculature  LUMBAR ROM: NT in standing due to balance deficits, fall risk   LOWER EXTREMITY ROM:     AAROM  Right eval Left eval R/L 10/24/22  Hip flexion 90 90 100/105  Hip extension -10 -10   Hip abduction     Hip adduction     Hip internal rotation     Hip external rotation     Knee flexion     Knee  extension     Ankle dorsiflexion     Ankle plantarflexion     Ankle inversion     Ankle eversion      (Blank rows = wfl)  LOWER EXTREMITY MMT:    MMT Right eval Left eval R 10/24/22 L 10/24/22  Hip flexion      Hip extension lock bridge  4' off mat 1" off mat 4- 4-  Hip abduction 4 3+ 4 4-  Hip adduction      Hip internal rotation      Hip external rotation 4 3+    Knee flexion      Knee extension      Ankle dorsiflexion 3+ 3+    Ankle plantarflexion 3+ 3+    Ankle inversion      Ankle eversion       (Blank rows = not tested)  LUMBAR SPECIAL TESTS:  Straight leg raise test: Positiveon L at 40 degrees for lateral L hip pain  FUNCTIONAL TESTS:  30 seconds chair stand test 8 3 position standing: feet together over one min safely, tandem 30 sec with sway and close SBA needed, unilateral standing 3 sec each  GAIT: Distance walked: walked within clinic 81' at a time.  No device, did note that she weaved frequently, lost her balance when walking to check out and had to brace on wall to stabilize.   TODAY'S TREATMENT:  DATE: 10/26/22:Therapeutic exercise:  the patient was instructed in very gentle ex to engage B hip motor recruitment and flexibility, due to her flare up in lumbar Sx: Supine with moist heat L lower back for therex: Manually stretching each hip into flexion, ER, also B piriformis to tolerance, 10 to 20 sec holds Supine with legs on physioball, 55cm for gentle rocking for LTR 15 reps Supine hooklying for pelvic tilt 15 reps Supine hooklying for hip ER/abd with red t band around knees 15 reps Nustep level 5, x 10 min Ue's and LE's for tissue perfusion and gentle LE ROM, at end of session   10/24/22 Rec Bike L3x98min Supine KTOS stretch 2x30 sec bil Supine figure 4 stretch 2x30 sec bil Supine bridge 2x10  Therapeutic Activity: Assessment of  LTGs for progress  10/19/22 Recumbent bike level 4 x 7 min Supine for stretching L hip with overpressure from therapist, in figure 4 position for ER and for pirformis with hip flexion Heel toe walking along counter, 8' x 5 reps Standing semitandem with arm movements B arms for/back overhead, rainbow arc, reaching side to side B knee hamstring curls 25# 30 reps B knee ext 10# 30 reps  Pt instruction in : Floor transfer, from kneeling to 1/2 kneeling to upright, using floor and knee to move upright, also with support of mat table Patient attempted with R and L leg leading for  1/2 kneeling and was unable leading with L Leg.  Instruction in golfer's lifts pivoting on R LE Instruction in lifting box from floor, instructed in maintaining object close to trunk to reduce torque on spine     10/17/22 Nustep L5x68min Prone hip extension bil x 12  S/L hip abduction x 12 bil  STM to L glute med/min Passive stretch into ER and IR with mobilization  10/11/22 Nustep L6x63min Clocks 12 o'clock to 6 o'clock bil 5x Standing on airex head turns x 10 fixed gaze Standing on airex trunk rotations x 10 Toe taps from airex x 10 bil STM to L lumbar paraspinals, upper glutes  10/09/22 Bike L4x58min 19/24 DGI Toe taps to yoga block: Fwd x 10 bil Lateral x 10 bil Tandem walk 4x at counter down and back Fwd step and reach unilateral x 10 bil  10/05/22:  Standing for penguins with green t band around thighs x 10  Standing for forward backward rocks with green t band around thighs x 10  Heel/toe rocks on airex  Static standing on airex, horizontal head turns Forward step ups 15 x each leg to 6" Leg press 20# B LE's 3 x 12 Seated hamstring curls, 25#, 3 x 10   10/03/22 Bike L1x32min Fwd step ups 6' x 10 bil Lateral step ups x 10 bil Runner balance x 10 bil intermittent support Kettle bell swing no weight  x 10  Leg press 20# 2x15 Figure 4 stretch 2 x 30 sec bil Hooklying LTR x 10  bil  09/29/22 Nustep L5x52min  Standing for penguins with green t band around thighs x 10  Standing for forward backward rocks with green t band around thighs x 10  Semi tandem in corner, head turns with gaze stability in corner Knee flexion 20# 2x10 Knee extension 10# 2x10 Seated hip ER x 10   09/26/22: Neuromuscular reed:  addressed balance deficits:  Completed DGI Added standing in corner , semi tandem standing, with head turns  Reviewed heel toe rocks, patient needs counter to maintain balance  Therex: instructed in the  following ex to build her strength, stability, also motor control, coordination, focused primarily on hip musculture   Standing for penguins with green t band around thighs  Standing for forward backward rocks with green t band around thighs  BATCA B knee extension 15 reps, 2 sets  BATCA B knee flexion 15 reps, 2 sets  Nustep:  level 5, Ue's and LE's  09/07/22: Therex:  Inst in heel toe rocks , next to sink at home, at intervals throughout the day, to improve righting reactions, balance Inst in seated theraband hip abd/ER , black, to perform 15 x 2 -3 x day for motor control /strength B hip abductors    PATIENT EDUCATION:  Education details: HEP update- see below Person educated: Patient Education method: Explanation, Demonstration, Tactile cues, and Verbal cues Education comprehension: verbalized understanding, returned demonstration, and verbal cues required  HOME EXERCISE PROGRAM: Access Code: [email or web address] URL: [email or web address] Date: 10/17/2022 Prepared by: Verta Ellen  Exercises - Standing 4-Way Leg Reach with Counter Support  - 1 x daily - 7 x weekly - 3 sets - 10 reps - Sidelying Hip Abduction  - 1 x daily - 7 x weekly - 2 sets - 10 reps - Prone Hip Extension  - 1 x daily - 7 x weekly - 2 sets - 10 reps  ASSESSMENT:  CLINICAL IMPRESSION: Pt is attending skilled physical therapy due to L L5/S1 radiculopathy.  Has undergone her  MRI, received results yesterday and will be undergoing a LS fusion when able to schedule, sometime late October.  Also to undergo injection soon.  She had a flare up this week, was bedridden yesterday for the most part, therefore significantly reduced the intensity of the exercise/activities today.  She  shoulder continue to benefit from skilled therapy to continue addressing strength, ROM, and other goals to restore and maintain her function.  OBJECTIVE IMPAIRMENTS: decreased activity tolerance, decreased balance, decreased coordination, decreased endurance, difficulty walking, decreased ROM, decreased strength, postural dysfunction, and pain.   ACTIVITY LIMITATIONS: carrying, lifting, bending, squatting, sleeping, transfers, and locomotion level  PARTICIPATION LIMITATIONS: cleaning, laundry, shopping, community activity, and yard work  PERSONAL FACTORS: Behavior pattern, Fitness, Past/current experiences, Time since onset of injury/illness/exacerbation, and 1-2 comorbidities: h/o afib, h/o LS fusion, osteopenia, h/o B foot fractures  are also affecting patient's functional outcome.   REHAB POTENTIAL: Good  CLINICAL DECISION MAKING: Evolving/moderate complexity  EVALUATION COMPLEXITY: Moderate   GOALS: Goals reviewed with patient? Yes  SHORT TERM GOALS: Target date: 2 weeks 09/21/22  I HEP Baseline: Goal status: MET- 10/03/22  LONG TERM GOALS: Target date: 11/30/22  Improve 30 sec sit to stand from 8 reps to 12 reps  Baseline:  Goal status: INITIAL  2.  Improve balance, DGI 22/24 Baseline: TBD not assessed at initial eval 09/26/22: 14/24 Goal status: Progressing- 10/09/22  3.  Reduce L radicular pain to 50% less frequent and less intensity Baseline: constant Goal status: PROGRESSING- 10/24/22  4.  Improve B hip flexion ROM to 110 each for improved sitting tolerance, improved mobility sit to stand Baseline: 90 degrees B Goal status: PROGRESSING- 10/24/22 see chart  5.  Improve  hip abd, ext  strength and plantar flexor strength to 4/5 Baseline: 3+/5 to 4-/5 Goal status: PROGRESSING- 10/24/22- see chart  PLAN:  PT FREQUENCY: 2x/week  PT DURATION: 12 weeks  PLANNED INTERVENTIONS: Therapeutic exercises, Therapeutic activity, Neuromuscular re-education, Balance training, Gait training, Patient/Family education, Self Care, and Joint mobilization.  PLAN FOR NEXT SESSION:  cardiovascular training on recumbent  machines, manual stretching B hips, additional therex to address strength and balance LE's and B hip flexibility.   [place] Doig Frazier Richards, PT, DPT, OCS 10/26/2022, 12:24 PM

## 2022-10-31 ENCOUNTER — Ambulatory Visit: Payer: 59

## 2022-10-31 ENCOUNTER — Other Ambulatory Visit: Payer: Self-pay

## 2022-10-31 DIAGNOSIS — M5416 Radiculopathy, lumbar region: Secondary | ICD-10-CM | POA: Diagnosis not present

## 2022-10-31 DIAGNOSIS — R29898 Other symptoms and signs involving the musculoskeletal system: Secondary | ICD-10-CM

## 2022-10-31 DIAGNOSIS — R2681 Unsteadiness on feet: Secondary | ICD-10-CM

## 2022-10-31 NOTE — Therapy (Signed)
OUTPATIENT PHYSICAL THERAPY TREATMENT   Patient Name: Kiara [name] MRN: [id number] DOB:09-18-1960, 62 y.o., female Today's Date: 10/31/2022  END OF SESSION:  PT End of Session - 10/31/22 1057     PT Start Time 0935    PT Stop Time 1015    PT Time Calculation (min) 40 min    Activity Tolerance Patient tolerated treatment well    Behavior During Therapy [facility] for tasks assessed/performed                       Past Medical History:  Diagnosis Date   Atrial fibrillation    a. admx with AFib with RVR and acute systolic CHF 07/2013; TEE with LAA clot and no DCCV done;     Bilateral bunions 12/27/2020   Capsulitis of metatarsophalangeal (MTP) joint of right foot 01/31/2021   Chronic systolic heart failure    Coronary artery disease    a.  LHC (07/17/13):  LM 20%, ostial CFX 20-30%   Degenerative disc disease, lumbar 10/30/2017   Degenerative scoliosis 10/30/2017   Essential hypertension 09/10/2020   Fatigue 04/13/2014   Generalized anxiety disorder 09/22/2019   History of asthma 07/21/2013   History of cervical dysplasia 12/22/2021   History of kidney stones 2013   History of lumbar spinal fusion 01/16/2019   Hypercholesterolemia 09/22/2019   Hyperlipemia    Hypothyroidism    past hx   Leg weakness, bilateral 11/28/2017   Long term (current) use of anticoagulants 10/30/2017   Loss of bladder control 11/28/2017   Lumbar radiculopathy 11/28/2017   Major depressive disorder 09/22/2019   Moderate mitral regurgitation 07/17/2013   NICM (nonischemic cardiomyopathy)    a. Echo (07/17/13):  EF 25-30%, EF subsequently normalized with sinus rhythm (tachycardia mediated CM)   Osteopenia    Overweight 07/18/2019   Sinus bradycardia 08/28/2013   Spondylolisthesis at L4-L5 level 10/30/2017   Past Surgical History:  Procedure Laterality Date   ABDOMINAL HYSTERECTOMY  ~ 2001   ABLATION  10/28/13   PVI by Dr Johney Frame   APPENDECTOMY  1978   ATRIAL FIBRILLATION ABLATION N/A  10/28/2013   Procedure: ATRIAL FIBRILLATION ABLATION;  Surgeon: Gardiner Rhyme, MD;  Location: [facility] CATH LAB;  Service: Cardiovascular;  Laterality: N/A;   ATRIAL FIBRILLATION ABLATION N/A 11/23/2020   Procedure: ATRIAL FIBRILLATION ABLATION;  Surgeon: Hillis Range, MD;  Location: [facility] INVASIVE CV LAB;  Service: Cardiovascular;  Laterality: N/A;   BACK SURGERY Bilateral 2019   Fusion   CARDIAC CATHETERIZATION  07/2013   CARDIOVERSION N/A 08/18/2013   Procedure: CARDIOVERSION;  Surgeon: Donato Schultz, MD;  Location: [facility] ENDOSCOPY;  Service: Cardiovascular;  Laterality: N/A;   CARDIOVERSION N/A 08/29/2013   Procedure: CARDIOVERSION;  Surgeon: Pricilla Riffle, MD;  Location: [facility] OR;  Service: Cardiovascular;  Laterality: N/A;   ELECTROPHYSIOLOGIC STUDY N/A 11/05/2014   Procedure: Atrial Fibrillation Ablation;  Surgeon: Hillis Range, MD;  Location: [facility] INVASIVE CV LAB;  Service: Cardiovascular;  Laterality: N/A;   EYE SURGERY Bilateral 1999   Corrective laser surgery   implantable loop recorder removal  10/02/2018   MDT Reveal LINQ removed in office by Dr Johney Frame for EOL battery   IR SACROPLASTY BILATERAL  02/06/2019   LEFT HEART CATHETERIZATION WITH CORONARY ANGIOGRAM N/A 07/17/2013   Procedure: LEFT HEART CATHETERIZATION WITH CORONARY ANGIOGRAM;  Surgeon: Micheline Chapman, MD;  Location: [facility] CATH LAB;  Service: Cardiovascular;  Laterality: N/A;   LOOP RECORDER IMPLANT N/A 06/05/2014   Procedure: LOOP RECORDER IMPLANT;  Surgeon: Hillis Range, MD;  Location: [facility] CATH LAB;  Service: Cardiovascular;  Laterality: N/A;   TEE WITHOUT CARDIOVERSION N/A 07/18/2013   Procedure: TRANSESOPHAGEAL ECHOCARDIOGRAM (TEE);  Surgeon: Lewayne Bunting, MD;  Location: [facility] ENDOSCOPY;  Service: Cardiovascular;  Laterality: N/A;   TEE WITHOUT CARDIOVERSION N/A 08/18/2013   Procedure: TRANSESOPHAGEAL ECHOCARDIOGRAM (TEE);  Surgeon: Donato Schultz, MD;  Location: [facility] ENDOSCOPY;  Service: Cardiovascular;  Laterality: N/A;   TEE WITHOUT CARDIOVERSION  N/A 10/27/2013   Procedure: TRANSESOPHAGEAL ECHOCARDIOGRAM (TEE);  Surgeon: Pricilla Riffle, MD;  Location: [facility] ENDOSCOPY;  Service: Cardiovascular;  Laterality: N/A;   TEE WITHOUT CARDIOVERSION N/A 11/04/2014   Procedure: TRANSESOPHAGEAL ECHOCARDIOGRAM (TEE);  Surgeon: Chrystie Nose, MD;  Location: [facility] ENDOSCOPY;  Service: Cardiovascular;  Laterality: N/A;   Patient Active Problem List   Diagnosis Date Noted   History of cervical dysplasia    Capsulitis of metatarsophalangeal (MTP) joint of right foot 01/31/2021   Essential hypertension 09/10/2020   Generalized anxiety disorder 09/22/2019   Hypercholesterolemia 09/22/2019   Major depressive disorder 09/22/2019   Overweight 07/18/2019   History of lumbar spinal fusion 01/16/2019   Leg weakness, bilateral 11/28/2017   Loss of bladder control 11/28/2017   Lumbar radiculopathy 11/28/2017   Degenerative disc disease, lumbar 10/30/2017   Degenerative scoliosis 10/30/2017   Long term (current) use of anticoagulants 10/30/2017   NICM (nonischemic cardiomyopathy) 10/30/2017   Spondylolisthesis at L4-L5 level 10/30/2017   Fatigue 04/13/2014   Sinus bradycardia 08/28/2013   Coronary artery disease    Chronic systolic heart failure    Atrial fibrillation    History of asthma 07/21/2013   Moderate mitral regurgitation 07/17/2013    PCP: [name], MD  REFERRING PROVIDER: Tia Alert, MD  REFERRING DIAG: L lumbar radiculopathy  Rationale for Evaluation and Treatment: Rehabilitation  THERAPY DIAG:  Left lumbar radiculopathy  Unsteady gait when walking  Weakness of both hips  ONSET DATE: 3 weeks  SUBJECTIVE:                                                                                                                                                                                           SUBJECTIVE STATEMENT: better today than last week, hasn't had to lie down and hasn't been in as much pain as she was the other day.      Eval: My balance has been really off for a long time so I have been wanting to get PT , this pain in my L leg has been there for about 3 weeks.  It is a similar pain to the pain prior to my other lumbar fusion surgeries  PERTINENT HISTORY:  H/o previous L 2/3 , 3/4, 4/5 fusions  PAIN:  Are you having pain? Yes: NPRS scale: 7/10 Pain location: L lower back and lateral thigh, just below L knee Pain description: pain with positioning, prolonged sitting Aggravating factors: sitting Relieving factors: resting  PRECAUTIONS: None  RED FLAGS: None   WEIGHT BEARING RESTRICTIONS: No  FALLS:  Has patient fallen in last 6 months? Yes. Number of falls 2  LIVING ENVIRONMENT: Lives with: lives with their spouse Lives in: House/apartment  OCCUPATION: disabled  PLOF: Independent and Needs assistance with homemaking  PATIENT GOALS: to get my balance better and be able to walk and keep up with my husband and daughter  NEXT MD VISIT: 10/05/22  OBJECTIVE:   DIAGNOSTIC FINDINGS:  None performed recently  PATIENT SURVEYS:  Modified Oswestry 25/50   SCREENING FOR RED FLAGS: Bowel or bladder incontinence: No Spinal tumors: No Cauda equina syndrome: No Compression fracture: No Abdominal aneurysm: No  COGNITION: Overall cognitive status: Within functional limits for tasks assessed     SENSATION: WFL  MUSCLE LENGTH: Prone knee bend with restricted B to 90 degrees knee flexion POSTURE:  L lateral pelvic shift.  Elevated L iliac crest 1 cm, flattened LS spine   PALPATION: Point tender along L iliac crest at origin of gluteal musculature  LUMBAR ROM: NT in standing due to balance deficits, fall risk   LOWER EXTREMITY ROM:     AAROM  Right eval Left eval R/L 10/24/22  Hip flexion 90 90 100/105  Hip extension -10 -10   Hip abduction     Hip adduction     Hip internal rotation     Hip external rotation     Knee flexion     Knee extension     Ankle dorsiflexion      Ankle plantarflexion     Ankle inversion     Ankle eversion      (Blank rows = wfl)  LOWER EXTREMITY MMT:    MMT Right eval Left eval R 10/24/22 L 10/24/22  Hip flexion      Hip extension lock bridge  4' off mat 1" off mat 4- 4-  Hip abduction 4 3+ 4 4-  Hip adduction      Hip internal rotation      Hip external rotation 4 3+    Knee flexion      Knee extension      Ankle dorsiflexion 3+ 3+    Ankle plantarflexion 3+ 3+    Ankle inversion      Ankle eversion       (Blank rows = not tested)  LUMBAR SPECIAL TESTS:  Straight leg raise test: Positiveon L at 40 degrees for lateral L hip pain  FUNCTIONAL TESTS:  30 seconds chair stand test 8 3 position standing: feet together over one min safely, tandem 30 sec with sway and close SBA needed, unilateral standing 3 sec each  GAIT: Distance walked: walked within clinic 89' at a time.  No device, did note that she weaved frequently, lost her balance when walking to check out and had to brace on wall to stabilize.   TODAY'S TREATMENT:  DATE: 10/31/22:Therapeutic exercise: Nustep, level 5 , Ue's and LE's, x 7 min Supine with 65 cm physioball under thighs, for 15 reps each of: LTR B knee to chest, with pelvic tilt Bridging, legs on 65 cm ball Hook lying B hip abd with blue band  Knee extension B 10#, 3 x 10 Knee flexion B 25#, 2 x 10    10/26/22:Therapeutic exercise:  the patient was instructed in very gentle ex to engage B hip motor recruitment and flexibility, due to her flare up in lumbar Sx: Supine with moist heat L lower back for therex: Manually stretching each hip into flexion, ER, also B piriformis to tolerance, 10 to 20 sec holds Supine with legs on physioball, 55cm for gentle rocking for LTR 15 reps Supine hooklying for pelvic tilt 15 reps Supine hooklying for hip ER/abd with red t band around  knees 15 reps Nustep level 5, x 10 min Ue's and LE's for tissue perfusion and gentle LE ROM, at end of session   10/24/22 Rec Bike L3x26min Supine KTOS stretch 2x30 sec bil Supine figure 4 stretch 2x30 sec bil Supine bridge 2x10  Therapeutic Activity: Assessment of LTGs for progress  10/19/22 Recumbent bike level 4 x 7 min Supine for stretching L hip with overpressure from therapist, in figure 4 position for ER and for pirformis with hip flexion Heel toe walking along counter, 8' x 5 reps Standing semitandem with arm movements B arms for/back overhead, rainbow arc, reaching side to side B knee hamstring curls 25# 30 reps B knee ext 10# 30 reps  Pt instruction in : Floor transfer, from kneeling to 1/2 kneeling to upright, using floor and knee to move upright, also with support of mat table Patient attempted with R and L leg leading for  1/2 kneeling and was unable leading with L Leg.  Instruction in golfer's lifts pivoting on R LE Instruction in lifting box from floor, instructed in maintaining object close to trunk to reduce torque on spine     10/17/22 Nustep L5x59min Prone hip extension bil x 12  S/L hip abduction x 12 bil  STM to L glute med/min Passive stretch into ER and IR with mobilization  10/11/22 Nustep L6x98min Clocks 12 o'clock to 6 o'clock bil 5x Standing on airex head turns x 10 fixed gaze Standing on airex trunk rotations x 10 Toe taps from airex x 10 bil STM to L lumbar paraspinals, upper glutes  10/09/22 Bike L4x44min 19/24 DGI Toe taps to yoga block: Fwd x 10 bil Lateral x 10 bil Tandem walk 4x at counter down and back Fwd step and reach unilateral x 10 bil  10/05/22:  Standing for penguins with green t band around thighs x 10  Standing for forward backward rocks with green t band around thighs x 10  Heel/toe rocks on airex  Static standing on airex, horizontal head turns Forward step ups 15 x each leg to 6" Leg press 20# B LE's 3 x 12 Seated  hamstring curls, 25#, 3 x 10   10/03/22 Bike L1x36min Fwd step ups 6' x 10 bil Lateral step ups x 10 bil Runner balance x 10 bil intermittent support Kettle bell swing no weight  x 10  Leg press 20# 2x15 Figure 4 stretch 2 x 30 sec bil Hooklying LTR x 10 bil  09/29/22 Nustep L5x86min  Standing for penguins with green t band around thighs x 10  Standing for forward backward rocks with green t band around thighs x 10  Semi tandem in corner, head turns with gaze stability in corner Knee flexion 20# 2x10 Knee extension 10# 2x10 Seated hip ER x 10   09/26/22: Neuromuscular reed:  addressed balance deficits:  Completed DGI Added standing in corner , semi tandem standing, with head turns  Reviewed heel toe rocks, patient needs counter to maintain balance  Therex: instructed in the following ex to build her strength, stability, also motor control, coordination, focused primarily on hip musculture   Standing for penguins with green t band around thighs  Standing for forward backward rocks with green t band around thighs  BATCA B knee extension 15 reps, 2 sets  BATCA B knee flexion 15 reps, 2 sets  Nustep:  level 5, Ue's and LE's  09/07/22: Therex:  Inst in heel toe rocks , next to sink at home, at intervals throughout the day, to improve righting reactions, balance Inst in seated theraband hip abd/ER , black, to perform 15 x 2 -3 x day for motor control /strength B hip abductors    PATIENT EDUCATION:  Education details: HEP update- see below Person educated: Patient Education method: Explanation, Demonstration, Tactile cues, and Verbal cues Education comprehension: verbalized understanding, returned demonstration, and verbal cues required  HOME EXERCISE PROGRAM: Access Code: [email or web address] URL: [email or web address] Date: 10/17/2022 Prepared by: Verta Ellen  Exercises - Standing 4-Way Leg Reach with Counter Support  - 1 x daily - 7 x weekly - 3 sets - 10 reps -  Sidelying Hip Abduction  - 1 x daily - 7 x weekly - 2 sets - 10 reps - Prone Hip Extension  - 1 x daily - 7 x weekly - 2 sets - 10 reps  ASSESSMENT:  CLINICAL IMPRESSION: Pt is attending skilled physical therapy due to L L5/S1 radiculopathy.  Has undergone her MRI, received results yesterday and will be undergoing a LS fusion when able to schedule, sometime late October.  Moving much more easily today as compared to last week. She has decided to cancel injection because it will take 3 to 4 weeks for medical clearance and to schedule it. Wants to continue for one additional visit and then DC, as the next 3 weeks are very busy with her daughter moving, and then plans fusion at end of October sometime.   She  shoulder continue to benefit from skilled therapy to continue addressing strength, ROM, and other goals to restore and maintain her function.  OBJECTIVE IMPAIRMENTS: decreased activity tolerance, decreased balance, decreased coordination, decreased endurance, difficulty walking, decreased ROM, decreased strength, postural dysfunction, and pain.   ACTIVITY LIMITATIONS: carrying, lifting, bending, squatting, sleeping, transfers, and locomotion level  PARTICIPATION LIMITATIONS: cleaning, laundry, shopping, community activity, and yard work  PERSONAL FACTORS: Behavior pattern, Fitness, Past/current experiences, Time since onset of injury/illness/exacerbation, and 1-2 comorbidities: h/o afib, h/o LS fusion, osteopenia, h/o B foot fractures  are also affecting patient's functional outcome.   REHAB POTENTIAL: Good  CLINICAL DECISION MAKING: Evolving/moderate complexity  EVALUATION COMPLEXITY: Moderate   GOALS: Goals reviewed with patient? Yes  SHORT TERM GOALS: Target date: 2 weeks 09/21/22  I HEP Baseline: Goal status: MET- 10/03/22  LONG TERM GOALS: Target date: 11/30/22  Improve 30 sec sit to stand from 8 reps to 12 reps  Baseline:  Goal status: INITIAL  2.  Improve balance, DGI  22/24 Baseline: TBD not assessed at initial eval 09/26/22: 14/24 Goal status: Progressing- 10/09/22  3.  Reduce L radicular pain to 50% less frequent and less intensity Baseline: constant Goal status: PROGRESSING-  10/24/22  4.  Improve B hip flexion ROM to 110 each for improved sitting tolerance, improved mobility sit to stand Baseline: 90 degrees B Goal status: PROGRESSING- 10/24/22 see chart  5.  Improve hip abd, ext  strength and plantar flexor strength to 4/5 Baseline: 3+/5 to 4-/5 Goal status: PROGRESSING- 10/24/22- see chart  PLAN:  PT FREQUENCY: 2x/week  PT DURATION: 12 weeks  PLANNED INTERVENTIONS: Therapeutic exercises, Therapeutic activity, Neuromuscular re-education, Balance training, Gait training, Patient/Family education, Self Care, and Joint mobilization.  PLAN FOR NEXT SESSION:  cardiovascular training on recumbent machines, manual stretching B hips, additional therex to address strength and balance LE's and B hip flexibility, DC.   Kiara [name], PT, DPT, OCS 10/31/2022, 11:08 AM

## 2022-11-02 ENCOUNTER — Ambulatory Visit: Payer: 59

## 2022-11-02 ENCOUNTER — Other Ambulatory Visit: Payer: Self-pay

## 2022-11-02 DIAGNOSIS — R29898 Other symptoms and signs involving the musculoskeletal system: Secondary | ICD-10-CM

## 2022-11-02 DIAGNOSIS — M5416 Radiculopathy, lumbar region: Secondary | ICD-10-CM

## 2022-11-02 DIAGNOSIS — R2681 Unsteadiness on feet: Secondary | ICD-10-CM

## 2022-11-02 NOTE — Therapy (Signed)
OUTPATIENT PHYSICAL THERAPY TREATMENT   Patient Name: Kiara [name] MRN: [id number] DOB:05/12/1960, 62 y.o., female Today's Date: 11/02/2022  END OF SESSION:  PT End of Session - 11/02/22 1042     Visit Number 13    Date for PT Re-Evaluation 11/30/22    Progress Note Due on Visit 20    PT Start Time 0935    PT Stop Time 1017    PT Time Calculation (min) 42 min    Activity Tolerance Patient tolerated treatment well    Behavior During Therapy [facility] for tasks assessed/performed                        Past Medical History:  Diagnosis Date   Atrial fibrillation    a. admx with AFib with RVR and acute systolic CHF 07/2013; TEE with LAA clot and no DCCV done;     Bilateral bunions 12/27/2020   Capsulitis of metatarsophalangeal (MTP) joint of right foot 01/31/2021   Chronic systolic heart failure    Coronary artery disease    a.  LHC (07/17/13):  LM 20%, ostial CFX 20-30%   Degenerative disc disease, lumbar 10/30/2017   Degenerative scoliosis 10/30/2017   Essential hypertension 09/10/2020   Fatigue 04/13/2014   Generalized anxiety disorder 09/22/2019   History of asthma 07/21/2013   History of cervical dysplasia 12/22/2021   History of kidney stones 2013   History of lumbar spinal fusion 01/16/2019   Hypercholesterolemia 09/22/2019   Hyperlipemia    Hypothyroidism    past hx   Leg weakness, bilateral 11/28/2017   Long term (current) use of anticoagulants 10/30/2017   Loss of bladder control 11/28/2017   Lumbar radiculopathy 11/28/2017   Major depressive disorder 09/22/2019   Moderate mitral regurgitation 07/17/2013   NICM (nonischemic cardiomyopathy)    a. Echo (07/17/13):  EF 25-30%, EF subsequently normalized with sinus rhythm (tachycardia mediated CM)   Osteopenia    Overweight 07/18/2019   Sinus bradycardia 08/28/2013   Spondylolisthesis at L4-L5 level 10/30/2017   Past Surgical History:  Procedure Laterality Date   ABDOMINAL HYSTERECTOMY  ~ 2001    ABLATION  10/28/13   PVI by Dr Johney Frame   APPENDECTOMY  1978   ATRIAL FIBRILLATION ABLATION N/A 10/28/2013   Procedure: ATRIAL FIBRILLATION ABLATION;  Surgeon: Gardiner Rhyme, MD;  Location: [facility] CATH LAB;  Service: Cardiovascular;  Laterality: N/A;   ATRIAL FIBRILLATION ABLATION N/A 11/23/2020   Procedure: ATRIAL FIBRILLATION ABLATION;  Surgeon: Hillis Range, MD;  Location: [facility] INVASIVE CV LAB;  Service: Cardiovascular;  Laterality: N/A;   BACK SURGERY Bilateral 2019   Fusion   CARDIAC CATHETERIZATION  07/2013   CARDIOVERSION N/A 08/18/2013   Procedure: CARDIOVERSION;  Surgeon: Donato Schultz, MD;  Location: [facility] ENDOSCOPY;  Service: Cardiovascular;  Laterality: N/A;   CARDIOVERSION N/A 08/29/2013   Procedure: CARDIOVERSION;  Surgeon: Pricilla Riffle, MD;  Location: [facility] OR;  Service: Cardiovascular;  Laterality: N/A;   ELECTROPHYSIOLOGIC STUDY N/A 11/05/2014   Procedure: Atrial Fibrillation Ablation;  Surgeon: Hillis Range, MD;  Location: [facility] INVASIVE CV LAB;  Service: Cardiovascular;  Laterality: N/A;   EYE SURGERY Bilateral 1999   Corrective laser surgery   implantable loop recorder removal  10/02/2018   MDT Reveal LINQ removed in office by Dr Johney Frame for EOL battery   IR SACROPLASTY BILATERAL  02/06/2019   LEFT HEART CATHETERIZATION WITH CORONARY ANGIOGRAM N/A 07/17/2013   Procedure: LEFT HEART CATHETERIZATION WITH CORONARY ANGIOGRAM;  Surgeon: Micheline Chapman, MD;  Location: [facility] CATH LAB;  Service: Cardiovascular;  Laterality: N/A;   LOOP RECORDER IMPLANT N/A 06/05/2014   Procedure: LOOP RECORDER IMPLANT;  Surgeon: Hillis Range, MD;  Location: [facility] CATH LAB;  Service: Cardiovascular;  Laterality: N/A;   TEE WITHOUT CARDIOVERSION N/A 07/18/2013   Procedure: TRANSESOPHAGEAL ECHOCARDIOGRAM (TEE);  Surgeon: Lewayne Bunting, MD;  Location: [facility] ENDOSCOPY;  Service: Cardiovascular;  Laterality: N/A;   TEE WITHOUT CARDIOVERSION N/A 08/18/2013   Procedure: TRANSESOPHAGEAL ECHOCARDIOGRAM (TEE);  Surgeon: Donato Schultz, MD;   Location: [facility] ENDOSCOPY;  Service: Cardiovascular;  Laterality: N/A;   TEE WITHOUT CARDIOVERSION N/A 10/27/2013   Procedure: TRANSESOPHAGEAL ECHOCARDIOGRAM (TEE);  Surgeon: Pricilla Riffle, MD;  Location: [facility] ENDOSCOPY;  Service: Cardiovascular;  Laterality: N/A;   TEE WITHOUT CARDIOVERSION N/A 11/04/2014   Procedure: TRANSESOPHAGEAL ECHOCARDIOGRAM (TEE);  Surgeon: Chrystie Nose, MD;  Location: [facility] ENDOSCOPY;  Service: Cardiovascular;  Laterality: N/A;   Patient Active Problem List   Diagnosis Date Noted   History of cervical dysplasia    Capsulitis of metatarsophalangeal (MTP) joint of right foot 01/31/2021   Essential hypertension 09/10/2020   Generalized anxiety disorder 09/22/2019   Hypercholesterolemia 09/22/2019   Major depressive disorder 09/22/2019   Overweight 07/18/2019   History of lumbar spinal fusion 01/16/2019   Leg weakness, bilateral 11/28/2017   Loss of bladder control 11/28/2017   Lumbar radiculopathy 11/28/2017   Degenerative disc disease, lumbar 10/30/2017   Degenerative scoliosis 10/30/2017   Long term (current) use of anticoagulants 10/30/2017   NICM (nonischemic cardiomyopathy) 10/30/2017   Spondylolisthesis at L4-L5 level 10/30/2017   Fatigue 04/13/2014   Sinus bradycardia 08/28/2013   Coronary artery disease    Chronic systolic heart failure    Atrial fibrillation    History of asthma 07/21/2013   Moderate mitral regurgitation 07/17/2013    PCP: [name], MD  REFERRING PROVIDER: Tia Alert, MD  REFERRING DIAG: L lumbar radiculopathy  Rationale for Evaluation and Treatment: Rehabilitation  THERAPY DIAG:  No diagnosis found.  ONSET DATE: 3 weeks  SUBJECTIVE:                                                                                                                                                                                           SUBJECTIVE STATEMENT: I've been refinishing furniture for my daughter's new apartment so I'm stiff and  moving slowly today. I do still plan for today to be my last day for PT as ill schedule for my L5S1 fusion surgery next week with him.    Eval: My balance has been really off for a long time so I have been wanting to get  PT , this pain in my L leg has been there for about 3 weeks.  It is a similar pain to the pain prior to my other lumbar fusion surgeries  PERTINENT HISTORY:  H/o previous L 2/3 , 3/4, 4/5 fusions  PAIN:  Are you having pain? Yes: NPRS scale: 7/10 Pain location: L lower back and lateral thigh, just below L knee Pain description: pain with positioning, prolonged sitting Aggravating factors: sitting Relieving factors: resting  PRECAUTIONS: None  RED FLAGS: None   WEIGHT BEARING RESTRICTIONS: No  FALLS:  Has patient fallen in last 6 months? Yes. Number of falls 2  LIVING ENVIRONMENT: Lives with: lives with their spouse Lives in: House/apartment  OCCUPATION: disabled  PLOF: Independent and Needs assistance with homemaking  PATIENT GOALS: to get my balance better and be able to walk and keep up with my husband and daughter  NEXT MD VISIT: 10/05/22  OBJECTIVE:   DIAGNOSTIC FINDINGS:  None performed recently  PATIENT SURVEYS:  Modified Oswestry 25/50   SCREENING FOR RED FLAGS: Bowel or bladder incontinence: No Spinal tumors: No Cauda equina syndrome: No Compression fracture: No Abdominal aneurysm: No  COGNITION: Overall cognitive status: Within functional limits for tasks assessed     SENSATION: WFL  MUSCLE LENGTH: Prone knee bend with restricted B to 90 degrees knee flexion POSTURE:  L lateral pelvic shift.  Elevated L iliac crest 1 cm, flattened LS spine   PALPATION: Point tender along L iliac crest at origin of gluteal musculature  LUMBAR ROM: NT in standing due to balance deficits, fall risk   LOWER EXTREMITY ROM:     AAROM  Right eval Left eval R/L 10/24/22  Hip flexion 90 90 100/105  Hip extension -10 -10   Hip abduction     Hip  adduction     Hip internal rotation     Hip external rotation     Knee flexion     Knee extension     Ankle dorsiflexion     Ankle plantarflexion     Ankle inversion     Ankle eversion      (Blank rows = wfl)  LOWER EXTREMITY MMT:    MMT Right eval Left eval R 10/24/22 L 10/24/22  Hip flexion      Hip extension lock bridge  4' off mat 1" off mat 4- 4-  Hip abduction 4 3+ 4 4-  Hip adduction      Hip internal rotation      Hip external rotation 4 3+    Knee flexion      Knee extension      Ankle dorsiflexion 3+ 3+    Ankle plantarflexion 3+ 3+    Ankle inversion      Ankle eversion       (Blank rows = not tested)  LUMBAR SPECIAL TESTS:  Straight leg raise test: Positiveon L at 40 degrees for lateral L hip pain  FUNCTIONAL TESTS:  30 seconds chair stand test 8 3 position standing: feet together over one min safely, tandem 30 sec with sway and close SBA needed, unilateral standing 3 sec each  GAIT: Distance walked: walked within clinic 67' at a time.  No device, did note that she weaved frequently, lost her balance when walking to check out and had to brace on wall to stabilize.   TODAY'S TREATMENT:  DATE: 11/02/22:  Nustep level 4, Ue's and LE's x 6 min Supine on mat table, with 55cm ball under legs, for LTR B knee to chest Bridging, all with physioball, x 15 reps each Therapist assisted stretching each hip, piriformis with gentle overpressure, and B hip ER with flexion with gentle overpressure Standing for tandem static positioning, 30 sec holds,  Progressed to tandem static standing with horizontal head turns  Semi tandem standing with B UE flexion, horizontal abd Heel toe gait along counter, 10' at a time 4 laps    10/31/22:Therapeutic exercise: Nustep, level 5 , Ue's and LE's, x 7 min Supine with 65 cm physioball under thighs, for 15 reps  each of: LTR B knee to chest, with pelvic tilt Bridging, legs on 65 cm ball Hook lying B hip abd with blue band  Knee extension B 10#, 3 x 10 Knee flexion B 25#, 2 x 10    10/26/22:Therapeutic exercise:  the patient was instructed in very gentle ex to engage B hip motor recruitment and flexibility, due to her flare up in lumbar Sx: Supine with moist heat L lower back for therex: Manually stretching each hip into flexion, ER, also B piriformis to tolerance, 10 to 20 sec holds Supine with legs on physioball, 55cm for gentle rocking for LTR 15 reps Supine hooklying for pelvic tilt 15 reps Supine hooklying for hip ER/abd with red t band around knees 15 reps Nustep level 5, x 10 min Ue's and LE's for tissue perfusion and gentle LE ROM, at end of session   10/24/22 Rec Bike L3x81min Supine KTOS stretch 2x30 sec bil Supine figure 4 stretch 2x30 sec bil Supine bridge 2x10  Therapeutic Activity: Assessment of LTGs for progress  10/19/22 Recumbent bike level 4 x 7 min Supine for stretching L hip with overpressure from therapist, in figure 4 position for ER and for pirformis with hip flexion Heel toe walking along counter, 8' x 5 reps Standing semitandem with arm movements B arms for/back overhead, rainbow arc, reaching side to side B knee hamstring curls 25# 30 reps B knee ext 10# 30 reps  Pt instruction in : Floor transfer, from kneeling to 1/2 kneeling to upright, using floor and knee to move upright, also with support of mat table Patient attempted with R and L leg leading for  1/2 kneeling and was unable leading with L Leg.  Instruction in golfer's lifts pivoting on R LE Instruction in lifting box from floor, instructed in maintaining object close to trunk to reduce torque on spine     10/17/22 Nustep L5x74min Prone hip extension bil x 12  S/L hip abduction x 12 bil  STM to L glute med/min Passive stretch into ER and IR with mobilization  10/11/22 Nustep L6x60min Clocks 12  o'clock to 6 o'clock bil 5x Standing on airex head turns x 10 fixed gaze Standing on airex trunk rotations x 10 Toe taps from airex x 10 bil STM to L lumbar paraspinals, upper glutes  10/09/22 Bike L4x57min 19/24 DGI Toe taps to yoga block: Fwd x 10 bil Lateral x 10 bil Tandem walk 4x at counter down and back Fwd step and reach unilateral x 10 bil  10/05/22:  Standing for penguins with green t band around thighs x 10  Standing for forward backward rocks with green t band around thighs x 10  Heel/toe rocks on airex  Static standing on airex, horizontal head turns Forward step ups 15 x each leg to 6" Leg press  20# B LE's 3 x 12 Seated hamstring curls, 25#, 3 x 10   10/03/22 Bike L1x79min Fwd step ups 6' x 10 bil Lateral step ups x 10 bil Runner balance x 10 bil intermittent support Kettle bell swing no weight  x 10  Leg press 20# 2x15 Figure 4 stretch 2 x 30 sec bil Hooklying LTR x 10 bil  09/29/22 Nustep L5x67min  Standing for penguins with green t band around thighs x 10  Standing for forward backward rocks with green t band around thighs x 10  Semi tandem in corner, head turns with gaze stability in corner Knee flexion 20# 2x10 Knee extension 10# 2x10 Seated hip ER x 10   09/26/22: Neuromuscular reed:  addressed balance deficits:  Completed DGI Added standing in corner , semi tandem standing, with head turns  Reviewed heel toe rocks, patient needs counter to maintain balance  Therex: instructed in the following ex to build her strength, stability, also motor control, coordination, focused primarily on hip musculture   Standing for penguins with green t band around thighs  Standing for forward backward rocks with green t band around thighs  BATCA B knee extension 15 reps, 2 sets  BATCA B knee flexion 15 reps, 2 sets  Nustep:  level 5, Ue's and LE's  09/07/22: Therex:  Inst in heel toe rocks , next to sink at home, at intervals throughout the day, to improve righting  reactions, balance Inst in seated theraband hip abd/ER , black, to perform 15 x 2 -3 x day for motor control /strength B hip abductors    PATIENT EDUCATION:  Education details: HEP update- see below Person educated: Patient Education method: Explanation, Demonstration, Tactile cues, and Verbal cues Education comprehension: verbalized understanding, returned demonstration, and verbal cues required  HOME EXERCISE PROGRAM: Access Code: [email or web address] URL: [email or web address] Date: 10/17/2022 Prepared by: Verta Ellen  Exercises - Standing 4-Way Leg Reach with Counter Support  - 1 x daily - 7 x weekly - 3 sets - 10 reps - Sidelying Hip Abduction  - 1 x daily - 7 x weekly - 2 sets - 10 reps - Prone Hip Extension  - 1 x daily - 7 x weekly - 2 sets - 10 reps  ASSESSMENT:  CLINICAL IMPRESSION: Pt is attending skilled physical therapy due to L L5/S1 radiculopathy.  Has undergone her MRI, received results yesterday and will be undergoing a LS fusion when able to schedule, sometime late October. Overall her activity tolerance is much improved and activities involving narrowed stance with improved balance.  Improved B hip flexion ROM noted as well. She is experiencing ongoing radicular pain, weakness L LE and will undergo L5S1 fusion late October.  Will dc from skilled PT at this time.  OBJECTIVE IMPAIRMENTS: decreased activity tolerance, decreased balance, decreased coordination, decreased endurance, difficulty walking, decreased ROM, decreased strength, postural dysfunction, and pain.   ACTIVITY LIMITATIONS: carrying, lifting, bending, squatting, sleeping, transfers, and locomotion level  PARTICIPATION LIMITATIONS: cleaning, laundry, shopping, community activity, and yard work  PERSONAL FACTORS: Behavior pattern, Fitness, Past/current experiences, Time since onset of injury/illness/exacerbation, and 1-2 comorbidities: h/o afib, h/o LS fusion, osteopenia, h/o B foot fractures  are also  affecting patient's functional outcome.   REHAB POTENTIAL: Good  CLINICAL DECISION MAKING: Evolving/moderate complexity  EVALUATION COMPLEXITY: Moderate   GOALS: Goals reviewed with patient? Yes  SHORT TERM GOALS: Target date: 2 weeks 09/21/22  I HEP Baseline: Goal status: MET- 10/03/22  LONG TERM GOALS: Target date: 11/30/22  Improve  30 sec sit to stand from 8 reps to 12 reps  Baseline:  Goal status: INITIAL 11/02/22:  not assessed on DC date due to pt  with increased pain , this test would increase pain Sx  2.  Improve balance, DGI 22/24 Baseline: TBD not assessed at initial eval 09/26/22: 14/24 Goal status: Progressing- 10/09/22  3.  Reduce L radicular pain to 50% less frequent and less intensity Baseline: constant Goal status: PROGRESSING- 10/24/22 11/02/22:  not met , still consistent , will undergo LS fusion  4.  Improve B hip flexion ROM to 110 each for improved sitting tolerance, improved mobility sit to stand Baseline: 90 degrees B Goal status: PROGRESSING- 10/24/22 see chart 11/02/22:  improved today to 110 each, MET  5.  Improve hip abd, ext  strength and plantar flexor strength to 4/5 Baseline: 3+/5 to 4-/5 Goal status: PROGRESSING- 10/24/22- see chart  PLAN:  PT FREQUENCY: 2x/week  PT DURATION: 12 weeks  PLANNED INTERVENTIONS: Therapeutic exercises, Therapeutic activity, Neuromuscular re-education, Balance training, Gait training, Patient/Family education, Self Care, and Joint mobilization.  PLAN FOR NEXT SESSION:  DC today as patient is going to undergo LS fusion  Mishti Swanton L Bradden Tadros, PT, DPT, OCS 11/02/2022, 10:43 AM

## 2022-11-07 ENCOUNTER — Ambulatory Visit (INDEPENDENT_AMBULATORY_CARE_PROVIDER_SITE_OTHER): Payer: 59

## 2022-11-07 DIAGNOSIS — Z01810 Encounter for preprocedural cardiovascular examination: Secondary | ICD-10-CM

## 2022-11-07 NOTE — Progress Notes (Signed)
   Virtual Visit via Telephone Note   Patient is no longer undergoing lumbar ESI. She has a visit with surgeon today to discuss surgery. She will have the surgeon send in a request with the appropriate procedure. Will reschedule her virtual visit based on the new procedure.    Carlos Levering, NP  11/07/2022, 7:43 AM

## 2022-11-08 ENCOUNTER — Ambulatory Visit: Payer: 59 | Admitting: Behavioral Health

## 2022-11-08 ENCOUNTER — Encounter: Payer: Self-pay | Admitting: Behavioral Health

## 2022-11-08 ENCOUNTER — Telehealth: Payer: Self-pay | Admitting: *Deleted

## 2022-11-08 DIAGNOSIS — F411 Generalized anxiety disorder: Secondary | ICD-10-CM

## 2022-11-08 DIAGNOSIS — F4381 Prolonged grief disorder: Secondary | ICD-10-CM

## 2022-11-08 NOTE — Telephone Encounter (Signed)
See multiple my chart messages from pt about pre op clearance. I have reached out to [place] at [facility] about new clearance form to be faxed to our office.
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
## 2022-11-09 ENCOUNTER — Telehealth: Payer: Self-pay | Admitting: *Deleted

## 2022-11-09 DIAGNOSIS — M4120 Other idiopathic scoliosis, site unspecified: Secondary | ICD-10-CM | POA: Insufficient documentation

## 2022-11-09 DIAGNOSIS — M810 Age-related osteoporosis without current pathological fracture: Secondary | ICD-10-CM | POA: Insufficient documentation

## 2022-11-09 NOTE — Telephone Encounter (Signed)
  Patient Consent for Virtual Visit    Kiara [name] has provided verbal consent on 11/09/2022 for a virtual visit (video or telephone).  CONSENT FOR VIRTUAL VISIT FOR:  Kiara [name]  By participating in this virtual visit I agree to the following:  I hereby voluntarily request, consent and authorize [facility] and its employed or contracted physicians, physician assistants, nurse practitioners or other licensed health care professionals (the Practitioner), to provide me with telemedicine health care services (the "Services") as deemed necessary by the treating Practitioner. I acknowledge and consent to receive the Services by the Practitioner via telemedicine. I understand that the telemedicine visit will involve communicating with the Practitioner through live audiovisual communication technology and the disclosure of certain medical information by electronic transmission. I acknowledge that I have been given the opportunity to request an in-person assessment or other available alternative prior to the telemedicine visit and am voluntarily participating in the telemedicine visit.  I understand that I have the right to withhold or withdraw my consent to the use of telemedicine in the course of my care at any time, without affecting my right to future care or treatment, and that the Practitioner or I may terminate the telemedicine visit at any time. I understand that I have the right to inspect all information obtained and/or recorded in the course of the telemedicine visit and may receive copies of available information for a reasonable fee.  I understand that some of the potential risks of receiving the Services via telemedicine include:  Delay or interruption in medical evaluation due to technological equipment failure or disruption; Information transmitted may not be sufficient (e.g. poor resolution of images) to allow for appropriate medical decision making by the Practitioner; and/or   In rare instances, security protocols could fail, causing a breach of personal health information.  Furthermore, I acknowledge that it is my responsibility to provide information about my medical history, conditions and care that is complete and accurate to the best of my ability. I acknowledge that Practitioner's advice, recommendations, and/or decision may be based on factors not within their control, such as incomplete or inaccurate data provided by me or distortions of diagnostic images or specimens that may result from electronic transmissions. I understand that the practice of medicine is not an exact science and that Practitioner makes no warranties or guarantees regarding treatment outcomes. I acknowledge that a copy of this consent can be made available to me via my patient portal [facility] MyChart), or I can request a printed copy by calling the office of [facility].    I understand that my insurance will be billed for this visit.   I have read or had this consent read to me. I understand the contents of this consent, which adequately explains the benefits and risks of the Services being provided via telemedicine.  I have been provided ample opportunity to ask questions regarding this consent and the Services and have had my questions answered to my satisfaction. I give my informed consent for the services to be provided through the use of telemedicine in my medical care

## 2022-11-09 NOTE — Telephone Encounter (Signed)
Loop explanted in 2020.  No further devices noted.

## 2022-11-09 NOTE — Telephone Encounter (Signed)
Name: Kiara [name]  DOB: [birth year]  MRN: [id number]  Primary Cardiologist: Hillis Range, MD (Inactive)   Preoperative team, please contact this patient and set up a phone call appointment for further preoperative risk assessment. Please obtain consent and complete medication review. Thank you for your help.  I confirm that guidance regarding antiplatelet and oral anticoagulation therapy has been completed and, if necessary, noted below.  I also confirmed the patient resides in the state of [place]. As per [facility] Medical Board telemedicine laws, the patient must reside in the state in which the provider is licensed.  Patient with diagnosis of A fib on Eliquis for anticoagulation.     Procedure: Left L5-S1 TFES1  Date of procedure: 11/15/22   CHA2DS2-VASc Score = 4  This indicates a 4.8% annual risk of stroke. The patient's score is based upon: CHF History: 1 HTN History: 1 Diabetes History: 0 Stroke History: 0 Vascular Disease History: 1 Age Score: 0 Gender Score: 1     CrCl 102 mL/min Platelet count 200K     Per office protocol, patient can hold Eliquis for 3 days prior to procedure.     Ronney Asters, NP 11/09/2022, 9:56 AM [facility]

## 2022-11-09 NOTE — Telephone Encounter (Signed)
Patient with diagnosis of A fib on Eliquis for anticoagulation.    Procedure: Left L5-S1 TFES1  Date of procedure: 11/15/22  CHA2DS2-VASc Score = 4  This indicates a 4.8% annual risk of stroke. The patient's score is based upon: CHF History: 1 HTN History: 1 Diabetes History: 0 Stroke History: 0 Vascular Disease History: 1 Age Score: 0 Gender Score: 1    CrCl 102 mL/min Platelet count 200K   Per office protocol, patient can hold Eliquis for 3 days prior to procedure.  **This guidance is not considered finalized until pre-operative APP has relayed final recommendations.**

## 2022-11-09 NOTE — Telephone Encounter (Signed)
Pre-operative Risk Assessment    Patient Name: Kiara [name]  DOB: 12/05/1960 MRN: [id number]      Request for Surgical Clearance    Procedure:   Left L5-S1 TFES1  Date of Surgery:  Clearance 11/15/22                                 Surgeon:  Dr. Aileen Fass Surgeon's Group or Practice Name:  [facility] Phone number:  [phone] Fax number:  [phone]   Type of Clearance Requested:   - Medical  - Pharmacy:  Hold Apixaban (Eliquis) 3 days prior.   Type of Anesthesia:  Not Indicated   Additional requests/questions:    Signed, Emmit Pomfret   11/09/2022, 8:14 AM

## 2022-11-10 ENCOUNTER — Ambulatory Visit: Payer: 59 | Attending: Internal Medicine

## 2022-11-10 DIAGNOSIS — Z01810 Encounter for preprocedural cardiovascular examination: Secondary | ICD-10-CM | POA: Diagnosis not present

## 2022-11-10 NOTE — Progress Notes (Signed)
Virtual Visit via Telephone Note   Because of Kiara [name]'s co-morbid illnesses, she is at least at moderate risk for complications without adequate follow up.  This format is felt to be most appropriate for this patient at this time.  The patient did not have access to video technology/had technical difficulties with video requiring transitioning to audio format only (telephone).  All issues noted in this document were discussed and addressed.  No physical exam could be performed with this format.  Please refer to the patient's chart for her consent to telehealth for [facility].  Evaluation Performed:  Preoperative cardiovascular risk assessment _____________   Date:  11/10/2022   Patient ID:  Kiara [name], [name] November 28, 1960, MRN [id number] Patient Location:  Home Provider location:   Office  Primary Care Provider:  [name], MD Primary Cardiologist:  Hillis Range, MD (Inactive)  Chief Complaint / Patient Profile   62 y.o. y/o female with a h/o paroxysmal atrial fibrillation, tachycardia cardiomyopathy who is pending left L5-S1 TF ESI and presents today for telephonic preoperative cardiovascular risk assessment.  History of Present Illness    ARIANDA PILGREEN is a 62 y.o. female who presents via audio/video conferencing for a telehealth visit today.  Pt was last seen in cardiology clinic on 05/05/2022 by Otilio Saber, PA.  At that time Sharlot Gowda was doing well .  The patient is now pending procedure as outlined above. Since her last visit, she continues to be stable from a cardiac standpoint.  Today she denies chest pain, shortness of breath, lower extremity edema, fatigue, palpitations, melena, hematuria, hemoptysis, diaphoresis, weakness, presyncope, syncope, orthopnea, and PND.   Past Medical History    Past Medical History:  Diagnosis Date   Atrial fibrillation    a. admx with AFib with RVR and acute systolic CHF 07/2013; TEE with LAA clot and no DCCV done;      Bilateral bunions 12/27/2020   Capsulitis of metatarsophalangeal (MTP) joint of right foot 01/31/2021   Chronic systolic heart failure    Coronary artery disease    a.  LHC (07/17/13):  LM 20%, ostial CFX 20-30%   Degenerative disc disease, lumbar 10/30/2017   Degenerative scoliosis 10/30/2017   Essential hypertension 09/10/2020   Fatigue 04/13/2014   Generalized anxiety disorder 09/22/2019   History of asthma 07/21/2013   History of cervical dysplasia 12/22/2021   History of kidney stones 2013   History of lumbar spinal fusion 01/16/2019   Hypercholesterolemia 09/22/2019   Hyperlipemia    Hypothyroidism    past hx   Leg weakness, bilateral 11/28/2017   Long term (current) use of anticoagulants 10/30/2017   Loss of bladder control 11/28/2017   Lumbar radiculopathy 11/28/2017   Major depressive disorder 09/22/2019   Moderate mitral regurgitation 07/17/2013   NICM (nonischemic cardiomyopathy)    a. Echo (07/17/13):  EF 25-30%, EF subsequently normalized with sinus rhythm (tachycardia mediated CM)   Osteopenia    Overweight 07/18/2019   Sinus bradycardia 08/28/2013   Spondylolisthesis at L4-L5 level 10/30/2017   Past Surgical History:  Procedure Laterality Date   ABDOMINAL HYSTERECTOMY  ~ 2001   ABLATION  10/28/13   PVI by Dr Johney Frame   APPENDECTOMY  1978   ATRIAL FIBRILLATION ABLATION N/A 10/28/2013   Procedure: ATRIAL FIBRILLATION ABLATION;  Surgeon: Gardiner Rhyme, MD;  Location: [facility] CATH LAB;  Service: Cardiovascular;  Laterality: N/A;   ATRIAL FIBRILLATION ABLATION N/A 11/23/2020   Procedure: ATRIAL FIBRILLATION ABLATION;  Surgeon: Johney Frame,  Fayrene Fearing, MD;  Location: [facility] INVASIVE CV LAB;  Service: Cardiovascular;  Laterality: N/A;   BACK SURGERY Bilateral 2019   Fusion   CARDIAC CATHETERIZATION  07/2013   CARDIOVERSION N/A 08/18/2013   Procedure: CARDIOVERSION;  Surgeon: Donato Schultz, MD;  Location: [facility] ENDOSCOPY;  Service: Cardiovascular;  Laterality: N/A;   CARDIOVERSION N/A  08/29/2013   Procedure: CARDIOVERSION;  Surgeon: Pricilla Riffle, MD;  Location: [facility] OR;  Service: Cardiovascular;  Laterality: N/A;   ELECTROPHYSIOLOGIC STUDY N/A 11/05/2014   Procedure: Atrial Fibrillation Ablation;  Surgeon: Hillis Range, MD;  Location: [facility] INVASIVE CV LAB;  Service: Cardiovascular;  Laterality: N/A;   EYE SURGERY Bilateral 1999   Corrective laser surgery   implantable loop recorder removal  10/02/2018   MDT Reveal LINQ removed in office by Dr Johney Frame for EOL battery   IR SACROPLASTY BILATERAL  02/06/2019   LEFT HEART CATHETERIZATION WITH CORONARY ANGIOGRAM N/A 07/17/2013   Procedure: LEFT HEART CATHETERIZATION WITH CORONARY ANGIOGRAM;  Surgeon: Micheline Chapman, MD;  Location: [facility] CATH LAB;  Service: Cardiovascular;  Laterality: N/A;   LOOP RECORDER IMPLANT N/A 06/05/2014   Procedure: LOOP RECORDER IMPLANT;  Surgeon: Hillis Range, MD;  Location: [facility] CATH LAB;  Service: Cardiovascular;  Laterality: N/A;   TEE WITHOUT CARDIOVERSION N/A 07/18/2013   Procedure: TRANSESOPHAGEAL ECHOCARDIOGRAM (TEE);  Surgeon: Lewayne Bunting, MD;  Location: [facility] ENDOSCOPY;  Service: Cardiovascular;  Laterality: N/A;   TEE WITHOUT CARDIOVERSION N/A 08/18/2013   Procedure: TRANSESOPHAGEAL ECHOCARDIOGRAM (TEE);  Surgeon: Donato Schultz, MD;  Location: [facility] ENDOSCOPY;  Service: Cardiovascular;  Laterality: N/A;   TEE WITHOUT CARDIOVERSION N/A 10/27/2013   Procedure: TRANSESOPHAGEAL ECHOCARDIOGRAM (TEE);  Surgeon: Pricilla Riffle, MD;  Location: [facility] ENDOSCOPY;  Service: Cardiovascular;  Laterality: N/A;   TEE WITHOUT CARDIOVERSION N/A 11/04/2014   Procedure: TRANSESOPHAGEAL ECHOCARDIOGRAM (TEE);  Surgeon: Chrystie Nose, MD;  Location: [facility] ENDOSCOPY;  Service: Cardiovascular;  Laterality: N/A;    Allergies  Allergies  Allergen Reactions   Azithromycin Diarrhea and Nausea And Vomiting   Other Nausea And Vomiting and Other (See Comments)    WEGOVY   Tape Rash    Surgical     Home Medications    Prior to Admission  medications   Medication Sig Start Date End Date Taking? Authorizing Provider  acetaminophen (TYLENOL) 500 MG tablet Take 1,000-2,000 mg by mouth every 6 (six) hours as needed for mild pain or headache.    [provider]  apixaban (ELIQUIS) 5 MG TABS tablet Take 1 tablet (5 mg total) by mouth 2 (two) times daily. 05/18/22   Lanier Prude, MD  atorvastatin (LIPITOR) 40 MG tablet Take 1 tablet (40 mg total) by mouth daily. 05/15/22   Graciella Freer, PA-C  buPROPion (WELLBUTRIN XL) 150 MG 24 hr tablet Take 150 mg by mouth daily. 09/09/15   [provider]  cyanocobalamin 2000 MCG tablet Take 5,000 mcg by mouth daily. One daily    [provider]  flecainide (TAMBOCOR) 100 MG tablet Take 1 tablet (100 mg total) by mouth 2 (two) times daily. 05/05/22   Graciella Freer, PA-C  furosemide (LASIX) 20 MG tablet Take 1 tablet (20 mg total) by mouth every other day. 08/15/22   Graciella Freer, PA-C  hydrOXYzine (ATARAX/VISTARIL) 25 MG tablet Take 25 mg by mouth at bedtime.    [provider]  KLOR-CON M10 10 MEQ tablet TAKE 1 TABLET (10 MEQ TOTAL) BY MOUTH EVERY OTHER DAY. WHEN TAKING LASIX 01/27/21   Allred,  Fayrene Fearing, MD  lisinopril (ZESTRIL) 5 MG tablet Take 1 tablet (5 mg total) by mouth daily. 05/15/22   Graciella Freer, PA-C  metoprolol succinate (TOPROL XL) 25 MG 24 hr tablet Take 0.5 tablets (12.5 mg total) by mouth at bedtime. 05/05/22   Graciella Freer, PA-C  Multiple Vitamin (MULTIVITAMIN) tablet Take 1 tablet by mouth daily. Woman's 50 +    [provider]  nystatin-triamcinolone (MYCOLOG II) cream Apply 1 application topically 2 (two) times daily as needed for rash. Patient not taking: Reported on 07/04/2022 09/09/20   [provider]  pramipexole (MIRAPEX) 0.25 MG tablet Take 0.25-0.5 mg by mouth at bedtime.    [provider]  sertraline (ZOLOFT) 100 MG tablet Take 200 mg by mouth every morning.  07/04/13    [provider]  Vitamin D3 (VITAMIN D) 25 MCG tablet Take 1,000 Units by mouth daily.    [provider]    Physical Exam    Vital Signs:  Sharlot Gowda does not have vital signs available for review today.  Given telephonic nature of communication, physical exam is limited. AAOx3. NAD. Normal affect.  Speech and respirations are unlabored.  Accessory Clinical Findings    None  Assessment & Plan    1.  Preoperative Cardiovascular Risk Assessment: Dr. Aileen Fass, [facility] Phone number:  [phone] Fax number:  [phone]      Primary Cardiologist: Hillis Range, MD (Inactive)  Chart reviewed as part of pre-operative protocol coverage. Given past medical history and time since last visit, based on ACC/AHA guidelines, Asja O Serpe would be at acceptable risk for the planned procedure without further cardiovascular testing.   Patient with diagnosis of A fib on Eliquis for anticoagulation.     Procedure: Left L5-S1 TFES1  Date of procedure: 11/15/22   CHA2DS2-VASc Score = 4  This indicates a 4.8% annual risk of stroke. The patient's score is based upon: CHF History: 1 HTN History: 1 Diabetes History: 0 Stroke History: 0 Vascular Disease History: 1 Age Score: 0 Gender Score: 1     CrCl 102 mL/min Platelet count 200K     Per office protocol, patient can hold Eliquis for 3 days prior to procedure.  Patient was advised that if she develops new symptoms prior to surgery to contact our office to arrange a follow-up appointment.  She verbalized understanding.  I will route this recommendation to the requesting party via Epic fax function and remove from pre-op pool.      Time:   Today, I have spent 5 minutes with the patient with telehealth technology discussing medical history, symptoms, and management plan.Prior to patient's phone evaluation I spent greater than 10 minutes reviewing their past medical history and  cardiac medications.    Ronney Asters, NP  11/10/2022, 8:07 AM

## 2022-11-14 ENCOUNTER — Telehealth: Payer: Self-pay | Admitting: Student

## 2022-11-14 NOTE — Telephone Encounter (Signed)
See previous clearance encounter.  Patient is following up. She states [facility] informed her that the clearance recommendation has not been received and they need to have it faxed to [phone]. Patient would like a call back to confirm.

## 2022-11-14 NOTE — Telephone Encounter (Signed)
Clearance note has been refaxed.   Left message for the patient to contact her requesting providers office and see if they have received the fax. Requested a call back if we need to refaxed clearance note.

## 2022-11-15 MED ORDER — POTASSIUM CHLORIDE CRYS ER 10 MEQ PO TBCR: EXTENDED_RELEASE_TABLET | ORAL | 1 refills | Status: DC

## 2022-11-15 NOTE — Telephone Encounter (Signed)
Spoke with patient concerning potassium refills, scheduled appointment with Dr Lalla Brothers on next available January. Otilio Saber approved refills in office, sent to [facility] at patient request. No further needs at this time

## 2022-11-22 ENCOUNTER — Ambulatory Visit: Payer: 59 | Admitting: Behavioral Health

## 2022-11-22 ENCOUNTER — Encounter: Payer: Self-pay | Admitting: Behavioral Health

## 2022-11-22 DIAGNOSIS — F4321 Adjustment disorder with depressed mood: Secondary | ICD-10-CM
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
## 2022-11-30 ENCOUNTER — Other Ambulatory Visit (HOSPITAL_BASED_OUTPATIENT_CLINIC_OR_DEPARTMENT_OTHER): Payer: Self-pay | Admitting: Student

## 2022-11-30 DIAGNOSIS — M5416 Radiculopathy, lumbar region: Secondary | ICD-10-CM

## 2022-12-06 ENCOUNTER — Ambulatory Visit: Payer: 59 | Admitting: Behavioral Health

## 2022-12-06 ENCOUNTER — Encounter: Payer: Self-pay | Admitting: Behavioral Health

## 2022-12-06 DIAGNOSIS — F4321 Adjustment disorder with depressed mood: Secondary | ICD-10-CM | POA: Diagnosis not present
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
## 2022-12-07 ENCOUNTER — Ambulatory Visit (HOSPITAL_BASED_OUTPATIENT_CLINIC_OR_DEPARTMENT_OTHER)
Admission: RE | Admit: 2022-12-07 | Discharge: 2022-12-07 | Disposition: A | Discharge status: Home / Self Care | Payer: 59 | Source: Ambulatory Visit | Attending: Student | Admitting: Student

## 2022-12-07 DIAGNOSIS — M5416 Radiculopathy, lumbar region: Secondary | ICD-10-CM | POA: Diagnosis present

## 2022-12-19 ENCOUNTER — Ambulatory Visit: Payer: 59 | Admitting: Behavioral Health

## 2022-12-19 ENCOUNTER — Encounter: Payer: Self-pay | Admitting: Behavioral Health

## 2022-12-19 DIAGNOSIS — F4321 Adjustment disorder with depressed mood: Secondary | ICD-10-CM | POA: Diagnosis not present
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
## 2022-12-28 ENCOUNTER — Encounter: Payer: Self-pay | Admitting: Cardiology

## 2023-01-03 LAB — LAB REPORT - SCANNED: EGFR: 83

## 2023-01-04 ENCOUNTER — Telehealth: Payer: Self-pay | Admitting: *Deleted

## 2023-01-04 NOTE — Telephone Encounter (Signed)
   Pre-operative Risk Assessment    Patient Name: Kiara [name]  DOB: [birth year] MRN: [id number]   Last OV: Otilio Saber, PA 05/05/2022 Upcoming OV: Dr. Lalla Brothers 02/26/2023  Request for Surgical Clearance    Procedure:   L5-S1 Lumbar fusion  Date of Surgery:  Clearance 01/15/23                                 Surgeon:  Dr. Marikay Alar Surgeon's Group or Practice Name:  [facility] Phone number:  [phone] Fax number:  [phone]   Type of Clearance Requested:   - Medical  - Pharmacy:  Hold Apixaban (Eliquis) Not Indicated.   Type of Anesthesia:  General    Additional requests/questions:    Signed, Emmit Pomfret   01/04/2023, 1:41 PM

## 2023-01-08 NOTE — Telephone Encounter (Signed)
Patient with diagnosis of atrial fibrillation on Eliquis for anticoagulation.    Procedure:   L5-S1 Lumbar fusion   Date of Surgery:  Clearance 01/15/23    CHA2DS2-VASc Score = 4   This indicates a 4.8% annual risk of stroke. The patient's score is based upon: CHF History: 1 HTN History: 1 Diabetes History: 0 Stroke History: 0 Vascular Disease History: 1 Age Score: 0 Gender Score: 1    CrCl 102 Platelet count 200  Per office protocol, patient can hold Eliquis for 3 days prior to procedure.   Patient will not need bridging with Lovenox (enoxaparin) around procedure.  **This guidance is not considered finalized until pre-operative APP has relayed final recommendations.**

## 2023-01-08 NOTE — Telephone Encounter (Signed)
   Name: Kiara [name]  DOB: 14-Keali-1962  MRN: [id number]  Primary Cardiologist: Hillis Range, MD (Inactive)  Chart reviewed as part of pre-operative protocol coverage. Because of [name]'s past medical history and time since last visit, she will require a follow-up in-office visit in order to better assess preoperative cardiovascular risk.  Pre-op covering staff: - Please schedule appointment and call patient to inform them. If patient already had an upcoming appointment within acceptable timeframe, please add "pre-op clearance" to the appointment notes so provider is aware. - Please contact requesting surgeon's office via preferred method (i.e, phone, fax) to inform them of need for appointment prior to surgery.  Per office protocol, patient can hold Eliquis for 3 days prior to procedure.   Patient will not need bridging with Lovenox (enoxaparin) around procedure.    Sharlene Dory, PA-C  01/08/2023, 7:54 AM

## 2023-01-09 NOTE — Telephone Encounter (Signed)
I will send a message to EP schedulers to see where they can get the pt in for appt as she needs pre op clearance.

## 2023-01-09 NOTE — Progress Notes (Unsigned)
Cardiology Clinic Note    Date:  01/10/2023  Patient ID:  Lulla, Partsch [birth year], MRN [id number] PCP:  [name], MD  Cardiologist:  Hillis Range, MD (Inactive) Electrophysiologist: Hillis Range MD > Lanier Prude, MD (though has not met)    Patient Profile    Chief Complaint: flecainide followup, pre-procedure eval  History of Present Illness: Nieshia O Rucci is a 62 y.o. female with PMH notable for parox Afib, bradycardia, NICM, HTN, ; seen today for Lanier Prude, MD for cardiac clearance for L5-S1 lumbar fusion  She is AF ablation x 3 (2015, 2016, 2022) She last saw [facility] 04/2022. He started BB at that visit as she was on flecainide without an AV nodal blocking agent.   On follow-up today, she is feeling well from a cardiac standpoint. Denies chest pain, chest pressure, palpitations. No SOB or exercise limitations. She is able to grocery shop easily without concerning symptoms.  Continues to take eliquis BID, no bleeding concerns.  Continues to take flecainide BID and toprol daily without missing doses.   She had labwork last week with PCP, on phone for review: Hgb - 13.8 Hct - 42.2  K - 4 Cr 0.79   AAD History: Tikosyn - stopped d/t QT prolongation Flecainide - on now    ROS:  Please see the history of present illness. All other systems are reviewed and otherwise negative.    Physical Exam    VS:  BP 130/74 (BP Location: Left Arm, Patient Position: Sitting, Cuff Size: Large)   Pulse (!) 53   Ht 5\' 6"  (1.676 m)   Wt 199 lb (90.3 kg)   SpO2 96%   BMI 32.12 kg/m  BMI: Body mass index is 32.12 kg/m.  Wt Readings from Last 3 Encounters:  01/10/23 199 lb (90.3 kg)  07/04/22 206 lb (93.4 kg)  06/02/22 206 lb (93.4 kg)     GEN- The patient is well appearing, alert and oriented x 3 today.   Lungs- Clear to ausculation bilaterally, normal work of breathing.  Heart- Regular rate and rhythm, no murmurs, rubs or gallops Extremities- No  peripheral edema, warm, dry   Studies Reviewed   Previous EP, cardiology notes.    EKG is ordered. Personal review of EKG from today shows:    EKG Interpretation Date/Time:  Wednesday January 10 2023 13:20:08 EST Ventricular Rate:  53 PR Interval:  170 QRS Duration:  104 QT Interval:  480 QTC Calculation: 450 R Axis:   8  Text Interpretation: Sinus bradycardia Confirmed by Sherie Don [phone]) on 01/10/2023 1:23:47 PM    05/05/2022 EKG - SR, rate 61bpm PR - QRS 104   TTE, 10/12/2020  1. Left ventricular ejection fraction, by estimation, is 60 to 65%. Left  ventricular ejection fraction by 3D volume is 62 %. The left ventricle has normal function. The left ventricle has no regional wall motion abnormalities. Left ventricular diastolic  parameters were normal.   2. Right ventricular systolic function is normal. The right ventricular size is normal. There is normal pulmonary artery systolic pressure.   3. Left atrial size was mildly dilated.   4. The mitral valve is normal in structure. Mild mitral valve regurgitation.   5. The aortic valve is normal in structure. Aortic valve regurgitation is not visualized. No aortic stenosis is present.     Assessment and Plan     #) Parox AFib #) NICM, likely caused by tachycardia #) high risk  medication monitoring - flecainide S/p AF ablation x3, most recently 2020 Maintaining sinus rhythm on 100mg  flecainide BID + 12.5mg  toprol EKG with stable intervals Recent labwork with PCP stable  #) Hypercoag d/t parox afib CHA2DS2-VASc Score = 4 [CHF History: 1, HTN History: 1, Diabetes History: 0, Stroke History: 0, Vascular Disease History: 1, Age Score: 0, Gender Score: 1].  Therefore, the patient's annual risk of stroke is 4.8 %.    Stroke ppx - 5mg  eliquis BID, appropriately dosed No bleeding concerns Recent CBC stable  #) pre-op cardiovascular evaluation prior to L5-S1 lumbar fusion RCRI = 1, indicating at 0.9% perioperative  risk of major cardiac event Maintaining sinus rhythm on AAD as above No further workup or evaluation needed prior to spinal surgery       Current medicines are reviewed at length with the patient today.   The patient does not have concerns regarding her medicines.  The following changes were made today:  none  Labs/ tests ordered today include:  Orders Placed This Encounter  Procedures   EKG 12-Lead     Disposition: Follow up with Dr. Lalla Brothers  in 6 months to establish care with new MD   Signed, Sherie Don, NP  01/10/23  1:42 PM  Electrophysiology [facility]

## 2023-01-09 NOTE — Telephone Encounter (Signed)
Pt has appt 01/10/23 with Sherie Don, NP in the [place] office per EP scheduler. I will update all parties involved.

## 2023-01-10 ENCOUNTER — Encounter: Payer: Self-pay | Admitting: Cardiology

## 2023-01-10 ENCOUNTER — Ambulatory Visit: Payer: 59 | Attending: Cardiology | Admitting: Cardiology

## 2023-01-10 VITALS — BP 130/74 | HR 53 | Ht 66.0 in | Wt 199.0 lb

## 2023-01-10 DIAGNOSIS — D6869 Other thrombophilia: Secondary | ICD-10-CM | POA: Diagnosis not present

## 2023-01-10 DIAGNOSIS — I480 Paroxysmal atrial fibrillation: Secondary | ICD-10-CM

## 2023-01-10 DIAGNOSIS — Z01810 Encounter for preprocedural cardiovascular examination: Secondary | ICD-10-CM

## 2023-01-10 DIAGNOSIS — R000 Tachycardia, unspecified: Secondary | ICD-10-CM

## 2023-01-10 DIAGNOSIS — I43 Cardiomyopathy in diseases classified elsewhere: Secondary | ICD-10-CM

## 2023-01-10 NOTE — Patient Instructions (Signed)
Medication Instructions:  Your physician recommends that you continue on your current medications as directed. Please refer to the Current Medication list given to you today.  *If you need a refill on your cardiac medications before your next appointment, please call your pharmacy*   Lab Work:  NONE  If you have labs (blood work) drawn today and your tests are completely normal, you will receive your results only by: MyChart Message (if you have MyChart) OR A paper copy in the mail If you have any lab test that is abnormal or we need to change your treatment, we will call you to review the results.   Testing/Procedures:  NONE   Follow-Up: At [facility], you and your health needs are our priority.  As part of our continuing mission to provide you with exceptional heart care, we have created designated Provider Care Teams.  These Care Teams include your primary Cardiologist (physician) and Advanced Practice Providers (APPs -  Physician Assistants and Nurse Practitioners) who all work together to provide you with the care you need, when you need it.  We recommend signing up for the patient portal called "MyChart".  Sign up information is provided on this After Visit Summary.  MyChart is used to connect with patients for Virtual Visits (Telemedicine).  Patients are able to view lab/test results, encounter notes, upcoming appointments, etc.  Non-urgent messages can be sent to your provider as well.   To learn more about what you can do with MyChart, go to [email or web address].    Your next appointment:   6 month(s)  Provider:    Steffanie Dunn, MD in [facility]

## 2023-01-19 ENCOUNTER — Ambulatory Visit: Payer: 59 | Admitting: Behavioral Health

## 2023-01-31 ENCOUNTER — Ambulatory Visit (INDEPENDENT_AMBULATORY_CARE_PROVIDER_SITE_OTHER): Payer: 59 | Admitting: Behavioral Health

## 2023-01-31 ENCOUNTER — Encounter: Payer: Self-pay | Admitting: Behavioral Health

## 2023-01-31 DIAGNOSIS — F4381 Prolonged grief disorder: Secondary | ICD-10-CM | POA: Diagnosis not present

## 2023-01-31 DIAGNOSIS — F4321 Adjustment disorder with depressed mood: Secondary | ICD-10-CM
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
## 2023-02-26 ENCOUNTER — Ambulatory Visit: Payer: 59 | Admitting: Cardiology

## 2023-03-02 ENCOUNTER — Ambulatory Visit: Payer: 59 | Admitting: Behavioral Health

## 2023-03-05 ENCOUNTER — Encounter: Payer: Self-pay | Admitting: Behavioral Health

## 2023-03-05 ENCOUNTER — Ambulatory Visit: Payer: 59 | Admitting: Behavioral Health

## 2023-03-05 DIAGNOSIS — F4321 Adjustment disorder with depressed mood: Secondary | ICD-10-CM

## 2023-03-05 DIAGNOSIS — F4381 Prolonged grief disorder: Secondary | ICD-10-CM | POA: Diagnosis not present
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
## 2023-03-07 NOTE — Therapy (Signed)
OUTPATIENT PHYSICAL THERAPY THORACOLUMBAR EVALUATION   Patient Name: Kiara [name] MRN: [id number] DOB:07-25-1960, 63 y.o., female Today's Date: 03/12/2023  END OF SESSION:  PT End of Session - 03/12/23 1158     Visit Number 1    Date for PT Re-Evaluation 05/21/23    Authorization Type [employer]    Progress Note Due on Visit 10    PT Start Time 1150    PT Stop Time 1235    PT Time Calculation (min) 45 min    Activity Tolerance Patient tolerated treatment well    Behavior During Therapy WFL for tasks assessed/performed             Past Medical History:  Diagnosis Date   Atrial fibrillation    a. admx with AFib with RVR and acute systolic CHF 07/2013; TEE with LAA clot and no DCCV done;     Bilateral bunions 12/27/2020   Capsulitis of metatarsophalangeal (MTP) joint of right foot 01/31/2021   Chronic systolic heart failure    Coronary artery disease    a.  LHC (07/17/13):  LM 20%, ostial CFX 20-30%   Degenerative disc disease, lumbar 10/30/2017   Degenerative scoliosis 10/30/2017   Essential hypertension 09/10/2020   Fatigue 04/13/2014   Generalized anxiety disorder 09/22/2019   History of asthma 07/21/2013   History of cervical dysplasia 12/22/2021   History of kidney stones 2013   History of lumbar spinal fusion 01/16/2019   Hypercholesterolemia 09/22/2019   Hyperlipemia    Hypothyroidism    past hx   Leg weakness, bilateral 11/28/2017   Long term (current) use of anticoagulants 10/30/2017   Loss of bladder control 11/28/2017   Lumbar radiculopathy 11/28/2017   Major depressive disorder 09/22/2019   Moderate mitral regurgitation 07/17/2013   NICM (nonischemic cardiomyopathy)    a. Echo (07/17/13):  EF 25-30%, EF subsequently normalized with sinus rhythm (tachycardia mediated CM)   Osteopenia    Overweight 07/18/2019   Sinus bradycardia 08/28/2013   Spondylolisthesis at L4-L5 level 10/30/2017   Past Surgical History:  Procedure Laterality Date   ABDOMINAL  HYSTERECTOMY  ~ 2001   ABLATION  10/28/13   PVI by Dr Johney Frame   APPENDECTOMY  1978   ATRIAL FIBRILLATION ABLATION N/A 10/28/2013   Procedure: ATRIAL FIBRILLATION ABLATION;  Surgeon: Gardiner Rhyme, MD;  Location: [facility] CATH LAB;  Service: Cardiovascular;  Laterality: N/A;   ATRIAL FIBRILLATION ABLATION N/A 11/23/2020   Procedure: ATRIAL FIBRILLATION ABLATION;  Surgeon: Hillis Range, MD;  Location: [facility] INVASIVE CV LAB;  Service: Cardiovascular;  Laterality: N/A;   BACK SURGERY Bilateral 2019   Fusion   CARDIAC CATHETERIZATION  07/2013   CARDIOVERSION N/A 08/18/2013   Procedure: CARDIOVERSION;  Surgeon: Donato Schultz, MD;  Location: [facility] ENDOSCOPY;  Service: Cardiovascular;  Laterality: N/A;   CARDIOVERSION N/A 08/29/2013   Procedure: CARDIOVERSION;  Surgeon: Pricilla Riffle, MD;  Location: [facility] OR;  Service: Cardiovascular;  Laterality: N/A;   ELECTROPHYSIOLOGIC STUDY N/A 11/05/2014   Procedure: Atrial Fibrillation Ablation;  Surgeon: Hillis Range, MD;  Location: [facility] INVASIVE CV LAB;  Service: Cardiovascular;  Laterality: N/A;   EYE SURGERY Bilateral 1999   Corrective laser surgery   implantable loop recorder removal  10/02/2018   MDT Reveal LINQ removed in office by Dr Johney Frame for EOL battery   IR SACROPLASTY BILATERAL  02/06/2019   LEFT HEART CATHETERIZATION WITH CORONARY ANGIOGRAM N/A 07/17/2013   Procedure: LEFT HEART CATHETERIZATION WITH CORONARY ANGIOGRAM;  Surgeon: Micheline Chapman, MD;  Location: [facility] CATH  LAB;  Service: Cardiovascular;  Laterality: N/A;   LOOP RECORDER IMPLANT N/A 06/05/2014   Procedure: LOOP RECORDER IMPLANT;  Surgeon: Hillis Range, MD;  Location: [facility] CATH LAB;  Service: Cardiovascular;  Laterality: N/A;   TEE WITHOUT CARDIOVERSION N/A 07/18/2013   Procedure: TRANSESOPHAGEAL ECHOCARDIOGRAM (TEE);  Surgeon: Lewayne Bunting, MD;  Location: [facility] ENDOSCOPY;  Service: Cardiovascular;  Laterality: N/A;   TEE WITHOUT CARDIOVERSION N/A 08/18/2013   Procedure: TRANSESOPHAGEAL ECHOCARDIOGRAM (TEE);   Surgeon: Donato Schultz, MD;  Location: [facility] ENDOSCOPY;  Service: Cardiovascular;  Laterality: N/A;   TEE WITHOUT CARDIOVERSION N/A 10/27/2013   Procedure: TRANSESOPHAGEAL ECHOCARDIOGRAM (TEE);  Surgeon: Pricilla Riffle, MD;  Location: [facility] ENDOSCOPY;  Service: Cardiovascular;  Laterality: N/A;   TEE WITHOUT CARDIOVERSION N/A 11/04/2014   Procedure: TRANSESOPHAGEAL ECHOCARDIOGRAM (TEE);  Surgeon: Chrystie Nose, MD;  Location: [facility] ENDOSCOPY;  Service: Cardiovascular;  Laterality: N/A;   Patient Active Problem List   Diagnosis Date Noted   Scoliosis, or kyphoscoliosis, idiopathic 11/09/2022   Osteoporosis 11/09/2022   Primary osteoarthritis of right hip 01/31/2022   Greater trochanteric bursitis of right hip 01/31/2022   History of cervical dysplasia    Capsulitis of metatarsophalangeal (MTP) joint of right foot 01/31/2021   Bilateral bunions 12/27/2020   Essential hypertension 09/10/2020   Generalized anxiety disorder 09/22/2019   Hypercholesterolemia 09/22/2019   Major depressive disorder 09/22/2019   Overweight 07/18/2019   Fracture of sacrum (HCC) 01/30/2019   History of lumbar spinal fusion 01/16/2019   Leg weakness, bilateral 11/28/2017   Loss of bladder control 11/28/2017   Lumbar radiculopathy 11/28/2017   Spinal stenosis at L4-L5 level 11/28/2017   Degenerative disc disease, lumbar 10/30/2017   Degenerative scoliosis 10/30/2017   Long term (current) use of anticoagulants 10/30/2017   NICM (nonischemic cardiomyopathy) 10/30/2017   Spondylolisthesis at L4-L5 level 10/30/2017   Fatigue 04/13/2014   Sinus bradycardia 08/28/2013   Coronary artery disease    Chronic systolic heart failure    Atrial fibrillation    History of asthma 07/21/2013   Moderate mitral regurgitation 07/17/2013    PCP: [name], MD   REFERRING PROVIDER: Sherryl Manges*   REFERRING DIAG:  [phone] (ICD-10-CM) - Spondylolisthesis of lumbar region  [id number] (ICD-10-CM) - Status post lumbar spine  operation    Rationale for Evaluation and Treatment: Rehabilitation  THERAPY DIAG:  Other low back pain  Unsteadiness on feet  ONSET DATE: Jan 15, 2023 Fusion L5/S1  SUBJECTIVE:  SUBJECTIVE STATEMENT: Patient reports history of recent Fusion on 01/15/23, revised L3-4 (removed hardware) complicated by previous sacroplasty 4 years ago from broken sacrum, worried it would crack cement.  Having residual pain in sacrum, trying to ride it out.  MD thought it was expected, but it feels similar to when she broke it, dull aggravating pain just above butt cheeks.  Hoping PT would help.   Balance is my biggest issue, I'm very unsteady, I tend to drift when walking if not holding my husbands hand, I had to sneeze yesterday and the force of the sneeze almost knocked me over, if I turn around too quick I lose my balance.  Doing chair exercises with daughter now.  No restrictions from daughter.   PERTINENT HISTORY:  01/15/23 Fusion L5/S1 with hardware removal.  Previous back surgery (L2/3, 3/4, 4/5 fusion); osteoporosis, CAD, chronic systolic heart failure, Afib, history bil foot fractures.  Sacroplasty.   PAIN:  Are you having pain? Yes: NPRS scale: 4/10 Pain location: sacrum Pain description: dull ache Aggravating factors: sitting too long, sitting on hard surfaces, sitting without back support  Relieving factors: cushions, tylenol  PRECAUTIONS: Fall  RED FLAGS: None   WEIGHT BEARING RESTRICTIONS: No  FALLS:  Has patient fallen in last 6 months? No but feels very unsteady.    LIVING ENVIRONMENT: Lives with: lives with their spouse Lives in: House/apartment Stairs: No Has following equipment at home: Dan Humphreys - 2 wheeled  OCCUPATION: disabled  PLOF: Independent  PATIENT GOALS: regain my balance, hopefully  improve pain  NEXT MD VISIT: March 2025  OBJECTIVE:   DIAGNOSTIC FINDINGS:  12/07/2022 CT Lumbar Spine (PRE-OP) IMPRESSION: 1. Previous posterior decompression and discectomy at L2-3. Right-sided L3 pedicle screw does breach the superior endplate and enter the lateral disc space. No evidence of motion of either of the L3 screws. There is mild lucency and surrounding sclerosis at the left L2 screw. Possibility of mild screw motion does exist, but this does not appear pronounced. 2. Previous posterior decompression, diskectomy and fusion at L3-4 and L4-5. No evidence of motion or hardware failure. 3. L5-S1: Bulging of the disc. Bilateral facet arthropathy worse on the left than the right. Left foraminal stenosis primarily due to osteophytic encroachment from facet arthropathy in combination with endplate osteophytes and bulging disc that could affect the exiting left L5 nerve.  PATIENT SURVEYS:  Modified Oswestry 19/50   COGNITION: Overall cognitive status: Within functional limits for tasks assessed     SENSATION: Diminished to light touch L S1 dermatome.  Reports tingling in L foot still after surgery.   MUSCLE LENGTH: NT  POSTURE:  decreased cervical lordosis  PALPATION: NA  LUMBAR ROM:   AROM eval  Flexion   Extension   Right lateral flexion   Left lateral flexion   Right rotation   Left rotation    (Blank rows = not tested)  LOWER EXTREMITY ROM:      NT today  LOWER EXTREMITY MMT:    MMT Right* eval Left* eval  Hip flexion 4+ 4  Hip extension    Hip abduction 5 5  Hip adduction 5 5  Knee flexion 5 5  Knee extension 5 5  Ankle dorsiflexion 5 5  Ankle plantarflexion 5 WB 5 WB   (Blank rows = not tested) * tested in sitting  FUNCTIONAL TESTS:  5 times sit to stand: 19.5 sec with bil UE assist (needed for eccentric control due to sacral pain) Functional gait assessment: 12/30 MCTSIB: Condition 1:  Avg of 3 trials: 30 sec, Condition 2: Avg of 3  trials: 30 sec, Condition 3: Avg of 3 trials: 30 sec, Condition 4: Avg of 3 trials: 30 sec, and Total Score: 120/120   GAIT: Distance walked: 200' Assistive device utilized: None Level of assistance: Complete Independence Comments: occasionally unsteady, visually slow gait speed.   TODAY'S TREATMENT:                                                                                                                              DATE:   03/12/23 EVAL    PATIENT EDUCATION:  Education details: findings, POC Person educated: Patient Education method: Explanation Education comprehension: verbalized understanding  HOME EXERCISE PROGRAM: TBD  ASSESSMENT:  CLINICAL IMPRESSION: Kiara [name]  is a 63 y.o. female who was seen today for physical therapy evaluation and treatment for s/p L5-S1 fusion 8 weeks ago.  She reports sacral pain following surgery, but her primary concern is her balance, as she rates it as quite poor and is concerned about falls. Patient presents with impaired activity tolerance, impaired standing balance, impaired ambulation, and decreased safety awareness impacting safe and independent functional mobility. Examination also reveals patient is at risk for falls and functional decline as evidenced by the following objective test measures: FGA 12/30 (average for age is 20/30, less than 19/30 places at high risk for falls), and 5x sit to stand of 19.5 sec (>15sec indicates increased risk for falls and decreased BLE power). Patient will benefit from skilled physical therapy services to decrease pain and help reach the maximal level of functional independence and mobility. Patient demonstrates understanding of this plan of care and is in agreement with this plan.    OBJECTIVE IMPAIRMENTS: Abnormal gait, decreased activity tolerance, decreased balance, decreased endurance, difficulty walking, decreased strength, impaired perceived functional ability, and pain.   ACTIVITY LIMITATIONS:  carrying, lifting, bending, standing, squatting, sleeping, transfers, and locomotion level  PARTICIPATION LIMITATIONS: meal prep, cleaning, laundry, shopping, community activity, and yard work  PERSONAL FACTORS: Behavior pattern, Fitness, Past/current experiences, and 3+ comorbidities: Previous back surgery (L2/3, 3/4, 4/5 fusion); osteoporosis, CAD, chronic systolic heart failure, Afib, history bil foot fractures  are also affecting patient's functional outcome.   REHAB POTENTIAL: Good  CLINICAL DECISION MAKING: Evolving/moderate complexity  EVALUATION COMPLEXITY: Moderate   GOALS: Goals reviewed with patient? Yes  SHORT TERM GOALS: Target date: 03/26/2023   Patient will be independent with initial HEP.  Baseline: TBD Goal status: INITIAL   LONG TERM GOALS: Target date: 05/21/2023   Patient will be independent with advanced/ongoing HEP to improve outcomes and carryover.  Baseline:  Goal status: INITIAL  2.  Patient will report 75% improvement in low back/sacral pain to improve QOL.  Baseline: constant dull ache Goal status: INITIAL  3.  Patient will demonstrate improved functional strength as demonstrated by 5x STS < 15 sec to decrease fall risk. Baseline: 19.5 sec with bil UE assist Goal status: INITIAL  4.  Patient will report at least 6 points improvement on modified Oswestry to demonstrate improved functional ability.  Baseline: 19/50 Goal status: INITIAL   5.  Patient will score at least 19/30 on FGA to decrease risk of falls with injury. Baseline: 12/30 Goal status: INITIAL  PLAN:  PT FREQUENCY: 2x/week  PT DURATION: 10 weeks  PLANNED INTERVENTIONS: 97110-Therapeutic exercises, 97530- Therapeutic activity, 97112- Neuromuscular re-education, 97535- Self Care, 62130- Manual therapy, [phone]- Gait training, [id number]- Electrical stimulation (unattended), [phone]- Electrical stimulation (manual), [id number]- Ultrasound, Patient/Family education, Balance training, Stair training,  Taping, Dry Needling, Joint mobilization, Joint manipulation, Spinal mobilization, Vestibular training, Cryotherapy, and Moist heat.  PLAN FOR NEXT SESSION: core strengthening, hip strengthening, balance training, check hip mobility/hamstring/quad tightness, caution- sacral pain.    Jena Gauss, PT, DPT  03/12/2023, 12:58 PM

## 2023-03-12 ENCOUNTER — Other Ambulatory Visit: Payer: Self-pay

## 2023-03-12 ENCOUNTER — Ambulatory Visit: Payer: 59 | Attending: Student | Admitting: Physical Therapy

## 2023-03-12 ENCOUNTER — Encounter: Payer: Self-pay | Admitting: Physical Therapy

## 2023-03-12 DIAGNOSIS — R29898 Other symptoms and signs involving the musculoskeletal system: Secondary | ICD-10-CM | POA: Insufficient documentation

## 2023-03-12 DIAGNOSIS — M5416 Radiculopathy, lumbar region: Secondary | ICD-10-CM | POA: Insufficient documentation

## 2023-03-12 DIAGNOSIS — R2681 Unsteadiness on feet: Secondary | ICD-10-CM | POA: Diagnosis present

## 2023-03-12 DIAGNOSIS — M5459 Other low back pain: Secondary | ICD-10-CM | POA: Insufficient documentation

## 2023-03-15 ENCOUNTER — Ambulatory Visit: Payer: 59

## 2023-03-15 DIAGNOSIS — M5459 Other low back pain: Secondary | ICD-10-CM | POA: Diagnosis not present

## 2023-03-15 DIAGNOSIS — R29898 Other symptoms and signs involving the musculoskeletal system: Secondary | ICD-10-CM

## 2023-03-15 DIAGNOSIS — R2681 Unsteadiness on feet: Secondary | ICD-10-CM

## 2023-03-15 DIAGNOSIS — M5416 Radiculopathy, lumbar region: Secondary | ICD-10-CM

## 2023-03-15 NOTE — Therapy (Signed)
OUTPATIENT PHYSICAL THERAPY TREATMENT   Patient Name: Kiara [name] MRN: [id number] DOB:December 15, 1960, 63 y.o., female Today's Date: 03/15/2023  END OF SESSION:  PT End of Session - 03/15/23 1346     Visit Number 2    Date for PT Re-Evaluation 05/21/23    Authorization Type [employer]    Progress Note Due on Visit 10    PT Start Time 1315    PT Stop Time 1400    PT Time Calculation (min) 45 min    Activity Tolerance Patient tolerated treatment well    Behavior During Therapy WFL for tasks assessed/performed              Past Medical History:  Diagnosis Date   Atrial fibrillation    a. admx with AFib with RVR and acute systolic CHF 07/2013; TEE with LAA clot and no DCCV done;     Bilateral bunions 12/27/2020   Capsulitis of metatarsophalangeal (MTP) joint of right foot 01/31/2021   Chronic systolic heart failure    Coronary artery disease    a.  LHC (07/17/13):  LM 20%, ostial CFX 20-30%   Degenerative disc disease, lumbar 10/30/2017   Degenerative scoliosis 10/30/2017   Essential hypertension 09/10/2020   Fatigue 04/13/2014   Generalized anxiety disorder 09/22/2019   History of asthma 07/21/2013   History of cervical dysplasia 12/22/2021   History of kidney stones 2013   History of lumbar spinal fusion 01/16/2019   Hypercholesterolemia 09/22/2019   Hyperlipemia    Hypothyroidism    past hx   Leg weakness, bilateral 11/28/2017   Long term (current) use of anticoagulants 10/30/2017   Loss of bladder control 11/28/2017   Lumbar radiculopathy 11/28/2017   Major depressive disorder 09/22/2019   Moderate mitral regurgitation 07/17/2013   NICM (nonischemic cardiomyopathy)    a. Echo (07/17/13):  EF 25-30%, EF subsequently normalized with sinus rhythm (tachycardia mediated CM)   Osteopenia    Overweight 07/18/2019   Sinus bradycardia 08/28/2013   Spondylolisthesis at L4-L5 level 10/30/2017   Past Surgical History:  Procedure Laterality Date   ABDOMINAL HYSTERECTOMY  ~ 2001    ABLATION  10/28/13   PVI by Dr Johney Frame   APPENDECTOMY  1978   ATRIAL FIBRILLATION ABLATION N/A 10/28/2013   Procedure: ATRIAL FIBRILLATION ABLATION;  Surgeon: Gardiner Rhyme, MD;  Location: [facility] CATH LAB;  Service: Cardiovascular;  Laterality: N/A;   ATRIAL FIBRILLATION ABLATION N/A 11/23/2020   Procedure: ATRIAL FIBRILLATION ABLATION;  Surgeon: Hillis Range, MD;  Location: [facility] INVASIVE CV LAB;  Service: Cardiovascular;  Laterality: N/A;   BACK SURGERY Bilateral 2019   Fusion   CARDIAC CATHETERIZATION  07/2013   CARDIOVERSION N/A 08/18/2013   Procedure: CARDIOVERSION;  Surgeon: Donato Schultz, MD;  Location: [facility] ENDOSCOPY;  Service: Cardiovascular;  Laterality: N/A;   CARDIOVERSION N/A 08/29/2013   Procedure: CARDIOVERSION;  Surgeon: Pricilla Riffle, MD;  Location: [facility] OR;  Service: Cardiovascular;  Laterality: N/A;   ELECTROPHYSIOLOGIC STUDY N/A 11/05/2014   Procedure: Atrial Fibrillation Ablation;  Surgeon: Hillis Range, MD;  Location: [facility] INVASIVE CV LAB;  Service: Cardiovascular;  Laterality: N/A;   EYE SURGERY Bilateral 1999   Corrective laser surgery   implantable loop recorder removal  10/02/2018   MDT Reveal LINQ removed in office by Dr Johney Frame for EOL battery   IR SACROPLASTY BILATERAL  02/06/2019   LEFT HEART CATHETERIZATION WITH CORONARY ANGIOGRAM N/A 07/17/2013   Procedure: LEFT HEART CATHETERIZATION WITH CORONARY ANGIOGRAM;  Surgeon: Micheline Chapman, MD;  Location: [facility] CATH  LAB;  Service: Cardiovascular;  Laterality: N/A;   LOOP RECORDER IMPLANT N/A 06/05/2014   Procedure: LOOP RECORDER IMPLANT;  Surgeon: Hillis Range, MD;  Location: [facility] CATH LAB;  Service: Cardiovascular;  Laterality: N/A;   TEE WITHOUT CARDIOVERSION N/A 07/18/2013   Procedure: TRANSESOPHAGEAL ECHOCARDIOGRAM (TEE);  Surgeon: Lewayne Bunting, MD;  Location: [facility] ENDOSCOPY;  Service: Cardiovascular;  Laterality: N/A;   TEE WITHOUT CARDIOVERSION N/A 08/18/2013   Procedure: TRANSESOPHAGEAL ECHOCARDIOGRAM (TEE);  Surgeon: Donato Schultz, MD;   Location: [facility] ENDOSCOPY;  Service: Cardiovascular;  Laterality: N/A;   TEE WITHOUT CARDIOVERSION N/A 10/27/2013   Procedure: TRANSESOPHAGEAL ECHOCARDIOGRAM (TEE);  Surgeon: Pricilla Riffle, MD;  Location: [facility] ENDOSCOPY;  Service: Cardiovascular;  Laterality: N/A;   TEE WITHOUT CARDIOVERSION N/A 11/04/2014   Procedure: TRANSESOPHAGEAL ECHOCARDIOGRAM (TEE);  Surgeon: Chrystie Nose, MD;  Location: [facility] ENDOSCOPY;  Service: Cardiovascular;  Laterality: N/A;   Patient Active Problem List   Diagnosis Date Noted   Scoliosis, or kyphoscoliosis, idiopathic 11/09/2022   Osteoporosis 11/09/2022   Primary osteoarthritis of right hip 01/31/2022   Greater trochanteric bursitis of right hip 01/31/2022   History of cervical dysplasia    Capsulitis of metatarsophalangeal (MTP) joint of right foot 01/31/2021   Bilateral bunions 12/27/2020   Essential hypertension 09/10/2020   Generalized anxiety disorder 09/22/2019   Hypercholesterolemia 09/22/2019   Major depressive disorder 09/22/2019   Overweight 07/18/2019   Fracture of sacrum (HCC) 01/30/2019   History of lumbar spinal fusion 01/16/2019   Leg weakness, bilateral 11/28/2017   Loss of bladder control 11/28/2017   Lumbar radiculopathy 11/28/2017   Spinal stenosis at L4-L5 level 11/28/2017   Degenerative disc disease, lumbar 10/30/2017   Degenerative scoliosis 10/30/2017   Long term (current) use of anticoagulants 10/30/2017   NICM (nonischemic cardiomyopathy) 10/30/2017   Spondylolisthesis at L4-L5 level 10/30/2017   Fatigue 04/13/2014   Sinus bradycardia 08/28/2013   Coronary artery disease    Chronic systolic heart failure    Atrial fibrillation    History of asthma 07/21/2013   Moderate mitral regurgitation 07/17/2013    PCP: [name], MD   REFERRING PROVIDER: Sherryl Manges*   REFERRING DIAG:  [phone] (ICD-10-CM) - Spondylolisthesis of lumbar region  [phone] (ICD-10-CM) - Status post lumbar spine operation    Rationale for  Evaluation and Treatment: Rehabilitation  THERAPY DIAG:  Other low back pain  Unsteadiness on feet  Left lumbar radiculopathy  Unsteady gait when walking  Weakness of both hips  ONSET DATE: Jan 15, 2023 Fusion L5/S1  SUBJECTIVE:  SUBJECTIVE STATEMENT: Patient reports history of recent Fusion on 01/15/23, revised L3-4 (removed hardware) complicated by previous sacroplasty 4 years ago from broken sacrum. Pt reports doing fine except having sacral pain.    PERTINENT HISTORY:  01/15/23 Fusion L5/S1 with hardware removal.  Previous back surgery (L2/3, 3/4, 4/5 fusion); osteoporosis, CAD, chronic systolic heart failure, Afib, history bil foot fractures.  Sacroplasty.   PAIN:  Are you having pain? Yes: NPRS scale: 5/10 Pain location: sacrum Pain description: dull ache Aggravating factors: sitting too long, sitting on hard surfaces, sitting without back support  Relieving factors: cushions, tylenol  PRECAUTIONS: Fall  RED FLAGS: None   WEIGHT BEARING RESTRICTIONS: No  FALLS:  Has patient fallen in last 6 months? No but feels very unsteady.    LIVING ENVIRONMENT: Lives with: lives with their spouse Lives in: House/apartment Stairs: No Has following equipment at home: Dan Humphreys - 2 wheeled  OCCUPATION: disabled  PLOF: Independent  PATIENT GOALS: regain my balance, hopefully improve pain  NEXT MD VISIT: March 2025  OBJECTIVE:   DIAGNOSTIC FINDINGS:  12/07/2022 CT Lumbar Spine (PRE-OP) IMPRESSION: 1. Previous posterior decompression and discectomy at L2-3. Right-sided L3 pedicle screw does breach the superior endplate and enter the lateral disc space. No evidence of motion of either of the L3 screws. There is mild lucency and surrounding sclerosis at the left L2 screw. Possibility of mild  screw motion does exist, but this does not appear pronounced. 2. Previous posterior decompression, diskectomy and fusion at L3-4 and L4-5. No evidence of motion or hardware failure. 3. L5-S1: Bulging of the disc. Bilateral facet arthropathy worse on the left than the right. Left foraminal stenosis primarily due to osteophytic encroachment from facet arthropathy in combination with endplate osteophytes and bulging disc that could affect the exiting left L5 nerve.  PATIENT SURVEYS:  Modified Oswestry 19/50   COGNITION: Overall cognitive status: Within functional limits for tasks assessed     SENSATION: Diminished to light touch L S1 dermatome.  Reports tingling in L foot still after surgery.   MUSCLE LENGTH: NT  POSTURE:  decreased cervical lordosis  PALPATION: NA  LUMBAR ROM:   AROM eval  Flexion   Extension   Right lateral flexion   Left lateral flexion   Right rotation   Left rotation    (Blank rows = not tested)  LOWER EXTREMITY ROM:      NT today  LOWER EXTREMITY MMT:    MMT Right* eval Left* eval  Hip flexion 4+ 4  Hip extension    Hip abduction 5 5  Hip adduction 5 5  Knee flexion 5 5  Knee extension 5 5  Ankle dorsiflexion 5 5  Ankle plantarflexion 5 WB 5 WB   (Blank rows = not tested) * tested in sitting  FUNCTIONAL TESTS:  5 times sit to stand: 19.5 sec with bil UE assist (needed for eccentric control due to sacral pain) Functional gait assessment: 12/30 MCTSIB: Condition 1: Avg of 3 trials: 30 sec, Condition 2: Avg of 3 trials: 30 sec, Condition 3: Avg of 3 trials: 30 sec, Condition 4: Avg of 3 trials: 30 sec, and Total Score: 120/120   GAIT: Distance walked: 200' Assistive device utilized: None Level of assistance: Complete Independence Comments: occasionally unsteady, visually slow gait speed.   TODAY'S TREATMENT:  DATE:  03/15/23 Recumbent bike L2x46min [employer] pe[employer] x 15 Tandem stance 2x30" bil Supine pelvic tilt 10x3" Supine PPT + hip ADD 15x3" Supine RTB clams with TrA 15x3" Supine RTB march with TrA 15x B HS curl with ornage pball 03/12/23 EVAL    PATIENT EDUCATION:  Education details: findings, POC Person educated: Patient Education method: Explanation Education comprehension: verbalized understanding  HOME EXERCISE PROGRAM: Access Code: [email or web address] URL: [email or web address] Date: 03/15/2023 Prepared by: Verta Ellen  Exercises - Supine Posterior Pelvic Tilt  - 1 x daily - 7 x weekly - 2 sets - 10 reps - Hooklying Clamshell with Resistance  - 1 x daily - 7 x weekly - 2 sets - 10 reps - Supine March with Resistance Band  - 1 x daily - 7 x weekly - 2 sets - 10 reps  ASSESSMENT:  CLINICAL IMPRESSION: Went through exercises for gentle balance and core engagement along with hip strength. She needed tactile cures to fully engage her abdominals. Balance deficits seen today, would benefit from more dynamic activities to challenge balance.   OBJECTIVE IMPAIRMENTS: Abnormal gait, decreased activity tolerance, decreased balance, decreased endurance, difficulty walking, decreased strength, impaired perceived functional ability, and pain.   ACTIVITY LIMITATIONS: carrying, lifting, bending, standing, squatting, sleeping, transfers, and locomotion level  PARTICIPATION LIMITATIONS: meal prep, cleaning, laundry, shopping, community activity, and yard work  PERSONAL FACTORS: Behavior pattern, Fitness, Past/current experiences, and 3+ comorbidities: Previous back surgery (L2/3, 3/4, 4/5 fusion); osteoporosis, CAD, chronic systolic heart failure, Afib, history bil foot fractures  are also affecting patient's functional outcome.   REHAB POTENTIAL: Good  CLINICAL DECISION MAKING: Evolving/moderate complexity  EVALUATION COMPLEXITY: Moderate   GOALS: Goals reviewed with patient?  Yes  SHORT TERM GOALS: Target date: 03/26/2023   Patient will be independent with initial HEP.  Baseline: TBD Goal status: INITIAL   LONG TERM GOALS: Target date: 05/21/2023   Patient will be independent with advanced/ongoing HEP to improve outcomes and carryover.  Baseline:  Goal status: INITIAL  2.  Patient will report 75% improvement in low back/sacral pain to improve QOL.  Baseline: constant dull ache Goal status: INITIAL  3.  Patient will demonstrate improved functional strength as demonstrated by 5x STS < 15 sec to decrease fall risk. Baseline: 19.5 sec with bil UE assist Goal status: INITIAL  4.  Patient will report at least 6 points improvement on modified Oswestry to demonstrate improved functional ability.  Baseline: 19/50 Goal status: INITIAL   5.  Patient will score at least 19/30 on FGA to decrease risk of falls with injury. Baseline: 12/30 Goal status: INITIAL  PLAN:  PT FREQUENCY: 2x/week  PT DURATION: 10 weeks  PLANNED INTERVENTIONS: 97110-Therapeutic exercises, 97530- Therapeutic activity, 97112- Neuromuscular re-education, 97535- Self Care, [id number]- Manual therapy, [phone]- Gait training, [id number]- Electrical stimulation (unattended), [phone]- Electrical stimulation (manual), [id number]- Ultrasound, Patient/Family education, Balance training, Stair training, Taping, Dry Needling, Joint mobilization, Joint manipulation, Spinal mobilization, Vestibular training, Cryotherapy, and Moist heat.  PLAN FOR NEXT SESSION: core strengthening, hip strengthening, balance training, check hip mobility/hamstring/quad tightness, caution- sacral pain.    Darleene Cleaver, PTA 03/15/2023, 2:16 PM

## 2023-03-20 ENCOUNTER — Ambulatory Visit: Payer: 59 | Attending: Student

## 2023-03-20 DIAGNOSIS — M5416 Radiculopathy, lumbar region: Secondary | ICD-10-CM

## 2023-03-20 DIAGNOSIS — R2681 Unsteadiness on feet: Secondary | ICD-10-CM | POA: Diagnosis present

## 2023-03-20 DIAGNOSIS — R29898 Other symptoms and signs involving the musculoskeletal system: Secondary | ICD-10-CM

## 2023-03-20 DIAGNOSIS — M5459 Other low back pain: Secondary | ICD-10-CM | POA: Diagnosis present

## 2023-03-20 NOTE — Therapy (Signed)
 OUTPATIENT PHYSICAL THERAPY TREATMENT   Patient Name: Kiara [name] MRN: [id number] DOB:02-Feb-1961, 63 y.o., female Today's Date: 03/20/2023  END OF SESSION:  PT End of Session - 03/20/23 1401     Visit Number 3    Date for PT Re-Evaluation 05/21/23    Authorization Type [employer]    Progress Note Due on Visit 10    PT Start Time 1315    PT Stop Time 1357    PT Time Calculation (min) 42 min    Activity Tolerance Patient tolerated treatment well    Behavior During Therapy WFL for tasks assessed/performed               Past Medical History:  Diagnosis Date   Atrial fibrillation    a. admx with AFib with RVR and acute systolic CHF 07/2013; TEE with LAA clot and no DCCV done;     Bilateral bunions 12/27/2020   Capsulitis of metatarsophalangeal (MTP) joint of right foot 01/31/2021   Chronic systolic heart failure    Coronary artery disease    a.  LHC (07/17/13):  LM 20%, ostial CFX 20-30%   Degenerative disc disease, lumbar 10/30/2017   Degenerative scoliosis 10/30/2017   Essential hypertension 09/10/2020   Fatigue 04/13/2014   Generalized anxiety disorder 09/22/2019   History of asthma 07/21/2013   History of cervical dysplasia 12/22/2021   History of kidney stones 2013   History of lumbar spinal fusion 01/16/2019   Hypercholesterolemia 09/22/2019   Hyperlipemia    Hypothyroidism    past hx   Leg weakness, bilateral 11/28/2017   Long term (current) use of anticoagulants 10/30/2017   Loss of bladder control 11/28/2017   Lumbar radiculopathy 11/28/2017   Major depressive disorder 09/22/2019   Moderate mitral regurgitation 07/17/2013   NICM (nonischemic cardiomyopathy)    a. Echo (07/17/13):  EF 25-30%, EF subsequently normalized with sinus rhythm (tachycardia mediated CM)   Osteopenia    Overweight 07/18/2019   Sinus bradycardia 08/28/2013   Spondylolisthesis at L4-L5 level 10/30/2017   Past Surgical History:  Procedure Laterality Date   ABDOMINAL HYSTERECTOMY  ~ 2001    ABLATION  10/28/13   PVI by Dr Kelsie   APPENDECTOMY  1978   ATRIAL FIBRILLATION ABLATION N/A 10/28/2013   Procedure: ATRIAL FIBRILLATION ABLATION;  Surgeon: Lynwood JONETTA Kelsie, MD;  Location: [facility] CATH LAB;  Service: Cardiovascular;  Laterality: N/A;   ATRIAL FIBRILLATION ABLATION N/A 11/23/2020   Procedure: ATRIAL FIBRILLATION ABLATION;  Surgeon: Kelsie Lynwood, MD;  Location: [facility] INVASIVE CV LAB;  Service: Cardiovascular;  Laterality: N/A;   BACK SURGERY Bilateral 2019   Fusion   CARDIAC CATHETERIZATION  07/2013   CARDIOVERSION N/A 08/18/2013   Procedure: CARDIOVERSION;  Surgeon: Oneil Parchment, MD;  Location: [facility] ENDOSCOPY;  Service: Cardiovascular;  Laterality: N/A;   CARDIOVERSION N/A 08/29/2013   Procedure: CARDIOVERSION;  Surgeon: Vina Okey GAILS, MD;  Location: [facility] OR;  Service: Cardiovascular;  Laterality: N/A;   ELECTROPHYSIOLOGIC STUDY N/A 11/05/2014   Procedure: Atrial Fibrillation Ablation;  Surgeon: Lynwood Kelsie, MD;  Location: [facility] INVASIVE CV LAB;  Service: Cardiovascular;  Laterality: N/A;   EYE SURGERY Bilateral 1999   Corrective laser surgery   implantable loop recorder removal  10/02/2018   MDT Reveal LINQ removed in office by Dr Kelsie for EOL battery   IR SACROPLASTY BILATERAL  02/06/2019   LEFT HEART CATHETERIZATION WITH CORONARY ANGIOGRAM N/A 07/17/2013   Procedure: LEFT HEART CATHETERIZATION WITH CORONARY ANGIOGRAM;  Surgeon: Ozell JONETTA Fell, MD;  Location: [facility]  CATH LAB;  Service: Cardiovascular;  Laterality: N/A;   LOOP RECORDER IMPLANT N/A 06/05/2014   Procedure: LOOP RECORDER IMPLANT;  Surgeon: Lynwood Rakers, MD;  Location: [facility] CATH LAB;  Service: Cardiovascular;  Laterality: N/A;   TEE WITHOUT CARDIOVERSION N/A 07/18/2013   Procedure: TRANSESOPHAGEAL ECHOCARDIOGRAM (TEE);  Surgeon: Redell GORMAN Shallow, MD;  Location: [facility] ENDOSCOPY;  Service: Cardiovascular;  Laterality: N/A;   TEE WITHOUT CARDIOVERSION N/A 08/18/2013   Procedure: TRANSESOPHAGEAL ECHOCARDIOGRAM (TEE);  Surgeon: Oneil Parchment, MD;   Location: [facility] ENDOSCOPY;  Service: Cardiovascular;  Laterality: N/A;   TEE WITHOUT CARDIOVERSION N/A 10/27/2013   Procedure: TRANSESOPHAGEAL ECHOCARDIOGRAM (TEE);  Surgeon: Vina Okey GAILS, MD;  Location: [facility] ENDOSCOPY;  Service: Cardiovascular;  Laterality: N/A;   TEE WITHOUT CARDIOVERSION N/A 11/04/2014   Procedure: TRANSESOPHAGEAL ECHOCARDIOGRAM (TEE);  Surgeon: Vinie JAYSON Maxcy, MD;  Location: [facility] ENDOSCOPY;  Service: Cardiovascular;  Laterality: N/A;   Patient Active Problem List   Diagnosis Date Noted   Scoliosis, or kyphoscoliosis, idiopathic 11/09/2022   Osteoporosis 11/09/2022   Primary osteoarthritis of right hip 01/31/2022   Greater trochanteric bursitis of right hip 01/31/2022   History of cervical dysplasia    Capsulitis of metatarsophalangeal (MTP) joint of right foot 01/31/2021   Bilateral bunions 12/27/2020   Essential hypertension 09/10/2020   Generalized anxiety disorder 09/22/2019   Hypercholesterolemia 09/22/2019   Major depressive disorder 09/22/2019   Overweight 07/18/2019   Fracture of sacrum (HCC) 01/30/2019   History of lumbar spinal fusion 01/16/2019   Leg weakness, bilateral 11/28/2017   Loss of bladder control 11/28/2017   Lumbar radiculopathy 11/28/2017   Spinal stenosis at L4-L5 level 11/28/2017   Degenerative disc disease, lumbar 10/30/2017   Degenerative scoliosis 10/30/2017   Long term (current) use of anticoagulants 10/30/2017   NICM (nonischemic cardiomyopathy) 10/30/2017   Spondylolisthesis at L4-L5 level 10/30/2017   Fatigue 04/13/2014   Sinus bradycardia 08/28/2013   Coronary artery disease    Chronic systolic heart failure    Atrial fibrillation    History of asthma 07/21/2013   Moderate mitral regurgitation 07/17/2013    PCP: Clarice Nottingham, MD   REFERRING PROVIDER: Orlean Suzen Lacks*   REFERRING DIAG:  [phone] (ICD-10-CM) - Spondylolisthesis of lumbar region  [phone] (ICD-10-CM) - Status post lumbar spine operation    Rationale for  Evaluation and Treatment: Rehabilitation  THERAPY DIAG:  Other low back pain  Unsteadiness on feet  Left lumbar radiculopathy  Unsteady gait when walking  Weakness of both hips  ONSET DATE: Jan 15, 2023 Fusion L5/S1  SUBJECTIVE:  SUBJECTIVE STATEMENT: Doing good today.    PERTINENT HISTORY:  01/15/23 Fusion L5/S1 with hardware removal.  Previous back surgery (L2/3, 3/4, 4/5 fusion); osteoporosis, CAD, chronic systolic heart failure, Afib, history bil foot fractures.  Sacroplasty.   PAIN:  Are you having pain? Yes: NPRS scale: 5/10 Pain location: sacrum Pain description: dull ache Aggravating factors: sitting too long, sitting on hard surfaces, sitting without back support  Relieving factors: cushions, tylenol   PRECAUTIONS: Fall  RED FLAGS: None   WEIGHT BEARING RESTRICTIONS: No  FALLS:  Has patient fallen in last 6 months? No but feels very unsteady.    LIVING ENVIRONMENT: Lives with: lives with their spouse Lives in: House/apartment Stairs: No Has following equipment at home: Vannie - 2 wheeled  OCCUPATION: disabled  PLOF: Independent  PATIENT GOALS: regain my balance, hopefully improve pain  NEXT MD VISIT: March 2025  OBJECTIVE:   DIAGNOSTIC FINDINGS:  12/07/2022 CT Lumbar Spine (PRE-OP) IMPRESSION: 1. Previous posterior decompression and discectomy at L2-3. Right-sided L3 pedicle screw does breach the superior endplate and enter the lateral disc space. No evidence of motion of either of the L3 screws. There is mild lucency and surrounding sclerosis at the left L2 screw. Possibility of mild screw motion does exist, but this does not appear pronounced. 2. Previous posterior decompression, diskectomy and fusion at L3-4 and L4-5. No evidence of motion or hardware  failure. 3. L5-S1: Bulging of the disc. Bilateral facet arthropathy worse on the left than the right. Left foraminal stenosis primarily due to osteophytic encroachment from facet arthropathy in combination with endplate osteophytes and bulging disc that could affect the exiting left L5 nerve.  PATIENT SURVEYS:  Modified Oswestry 19/50   COGNITION: Overall cognitive status: Within functional limits for tasks assessed     SENSATION: Diminished to light touch L S1 dermatome.  Reports tingling in L foot still after surgery.   MUSCLE LENGTH: NT  POSTURE:  decreased cervical lordosis  PALPATION: NA  LUMBAR ROM:   AROM eval  Flexion   Extension   Right lateral flexion   Left lateral flexion   Right rotation   Left rotation    (Blank rows = not tested)  LOWER EXTREMITY ROM:      NT today  LOWER EXTREMITY MMT:    MMT Right* eval Left* eval  Hip flexion 4+ 4  Hip extension    Hip abduction 5 5  Hip adduction 5 5  Knee flexion 5 5  Knee extension 5 5  Ankle dorsiflexion 5 5  Ankle plantarflexion 5 WB 5 WB   (Blank rows = not tested) * tested in sitting  FUNCTIONAL TESTS:  5 times sit to stand: 19.5 sec with bil UE assist (needed for eccentric control due to sacral pain) Functional gait assessment: 12/30 MCTSIB: Condition 1: Avg of 3 trials: 30 sec, Condition 2: Avg of 3 trials: 30 sec, Condition 3: Avg of 3 trials: 30 sec, Condition 4: Avg of 3 trials: 30 sec, and Total Score: 120/120   GAIT: Distance walked: 200' Assistive device utilized: None Level of assistance: Complete Independence Comments: occasionally unsteady, visually slow gait speed.   TODAY'S TREATMENT:  DATE: 03/20/23 Therapeutic Exercise: to improve strength and mobility.  Demo, verbal and tactile cues throughout for technique.  Supine PPT 2x10 Supine march + TRA RTB  2x10 S/L clamshells RTB 12x3 bil Bridge + RTB x 10  NEUROMUSCULAR RE-EDUCATION: To improve posture, balance, and kinesthesia. Clock balance R/L Standing march with no UE support  Step ups no UE support 6' x 10   03/15/23 Recumbent bike L2x48min [employer] pe[employer] x 15 Tandem stance 2x30 bil Supine pelvic tilt 10x3 Supine PPT + hip ADD 15x3 Supine RTB clams with TrA 15x3 Supine RTB march with TrA 15x B HS curl with ornage pball 03/12/23 EVAL    PATIENT EDUCATION:  Education details: findings, POC Person educated: Patient Education method: Explanation Education comprehension: verbalized understanding  HOME EXERCISE PROGRAM: Access Code: [email or web address] URL: [email or web address] Date: 03/15/2023 Prepared by: Kaely Hollan  Exercises - Supine Posterior Pelvic Tilt  - 1 x daily - 7 x weekly - 2 sets - 10 reps - Hooklying Clamshell with Resistance  - 1 x daily - 7 x weekly - 2 sets - 10 reps - Supine March with Resistance Band  - 1 x daily - 7 x weekly - 2 sets - 10 reps  ASSESSMENT:  CLINICAL IMPRESSION: Continued with strengthening interventions to improve core activation and stability. Balance interventions focused on single leg stability, with many instances of imbalance seen. Close guarding required with balance interventions. Pt will have cataract surgery Thurs, cx next Tues per doctor recommendation.   OBJECTIVE IMPAIRMENTS: Abnormal gait, decreased activity tolerance, decreased balance, decreased endurance, difficulty walking, decreased strength, impaired perceived functional ability, and pain.   ACTIVITY LIMITATIONS: carrying, lifting, bending, standing, squatting, sleeping, transfers, and locomotion level  PARTICIPATION LIMITATIONS: meal prep, cleaning, laundry, shopping, community activity, and yard work  PERSONAL FACTORS: Behavior pattern, Fitness, Past/current experiences, and 3+ comorbidities: Previous back surgery (L2/3, 3/4, 4/5 fusion); osteoporosis,  CAD, chronic systolic heart failure, Afib, history bil foot fractures  are also affecting patient's functional outcome.   REHAB POTENTIAL: Good  CLINICAL DECISION MAKING: Evolving/moderate complexity  EVALUATION COMPLEXITY: Moderate   GOALS: Goals reviewed with patient? Yes  SHORT TERM GOALS: Target date: 03/26/2023   Patient will be independent with initial HEP.  Baseline: TBD Goal status: INITIAL   LONG TERM GOALS: Target date: 05/21/2023   Patient will be independent with advanced/ongoing HEP to improve outcomes and carryover.  Baseline:  Goal status: INITIAL  2.  Patient will report 75% improvement in low back/sacral pain to improve QOL.  Baseline: constant dull ache Goal status: INITIAL  3.  Patient will demonstrate improved functional strength as demonstrated by 5x STS < 15 sec to decrease fall risk. Baseline: 19.5 sec with bil UE assist Goal status: INITIAL  4.  Patient will report at least 6 points improvement on modified Oswestry to demonstrate improved functional ability.  Baseline: 19/50 Goal status: INITIAL   5.  Patient will score at least 19/30 on FGA to decrease risk of falls with injury. Baseline: 12/30 Goal status: INITIAL  PLAN:  PT FREQUENCY: 2x/week  PT DURATION: 10 weeks  PLANNED INTERVENTIONS: 97110-Therapeutic exercises, 97530- Therapeutic activity, 97112- Neuromuscular re-education, 97535- Self Care, [id number]- Manual therapy, [phone]- Gait training, [id number]- Electrical stimulation (unattended), [phone]- Electrical stimulation (manual), [id number]- Ultrasound, Patient/Family education, Balance training, Stair training, Taping, Dry Needling, Joint mobilization, Joint manipulation, Spinal mobilization, Vestibular training, Cryotherapy, and Moist heat.  PLAN FOR NEXT SESSION: core strengthening, hip strengthening, balance training, check hip mobility/hamstring/quad tightness, caution- sacral pain.  Cleaven Demario L Holly Pring, PTA 03/20/2023, 2:02 PM

## 2023-03-27 ENCOUNTER — Encounter: Payer: 59 | Admitting: Physical Therapy

## 2023-03-29 ENCOUNTER — Ambulatory Visit: Payer: 59

## 2023-03-29 DIAGNOSIS — M5416 Radiculopathy, lumbar region: Secondary | ICD-10-CM

## 2023-03-29 DIAGNOSIS — M5459 Other low back pain: Secondary | ICD-10-CM | POA: Diagnosis not present

## 2023-03-29 DIAGNOSIS — R2681 Unsteadiness on feet: Secondary | ICD-10-CM

## 2023-03-29 DIAGNOSIS — R29898 Other symptoms and signs involving the musculoskeletal system: Secondary | ICD-10-CM

## 2023-03-29 NOTE — Therapy (Signed)
OUTPATIENT PHYSICAL THERAPY TREATMENT   Patient Name: Kiara [name] MRN: [id number] DOB:March 27, 1960, 63 y.o., female Today's Date: 03/29/2023  END OF SESSION:      Past Medical History:  Diagnosis Date   Atrial fibrillation    a. admx with AFib with RVR and acute systolic CHF 07/2013; TEE with LAA clot and no DCCV done;     Bilateral bunions 12/27/2020   Capsulitis of metatarsophalangeal (MTP) joint of right foot 01/31/2021   Chronic systolic heart failure    Coronary artery disease    a.  LHC (07/17/13):  LM 20%, ostial CFX 20-30%   Degenerative disc disease, lumbar 10/30/2017   Degenerative scoliosis 10/30/2017   Essential hypertension 09/10/2020   Fatigue 04/13/2014   Generalized anxiety disorder 09/22/2019   History of asthma 07/21/2013   History of cervical dysplasia 12/22/2021   History of kidney stones 2013   History of lumbar spinal fusion 01/16/2019   Hypercholesterolemia 09/22/2019   Hyperlipemia    Hypothyroidism    past hx   Leg weakness, bilateral 11/28/2017   Long term (current) use of anticoagulants 10/30/2017   Loss of bladder control 11/28/2017   Lumbar radiculopathy 11/28/2017   Major depressive disorder 09/22/2019   Moderate mitral regurgitation 07/17/2013   NICM (nonischemic cardiomyopathy)    a. Echo (07/17/13):  EF 25-30%, EF subsequently normalized with sinus rhythm (tachycardia mediated CM)   Osteopenia    Overweight 07/18/2019   Sinus bradycardia 08/28/2013   Spondylolisthesis at L4-L5 level 10/30/2017   Past Surgical History:  Procedure Laterality Date   ABDOMINAL HYSTERECTOMY  ~ 2001   ABLATION  10/28/13   PVI by Dr Johney Frame   APPENDECTOMY  1978   ATRIAL FIBRILLATION ABLATION N/A 10/28/2013   Procedure: ATRIAL FIBRILLATION ABLATION;  Surgeon: Gardiner Rhyme, MD;  Location: [facility] CATH LAB;  Service: Cardiovascular;  Laterality: N/A;   ATRIAL FIBRILLATION ABLATION N/A 11/23/2020   Procedure: ATRIAL FIBRILLATION ABLATION;  Surgeon: Hillis Range,  MD;  Location: [facility] INVASIVE CV LAB;  Service: Cardiovascular;  Laterality: N/A;   BACK SURGERY Bilateral 2019   Fusion   CARDIAC CATHETERIZATION  07/2013   CARDIOVERSION N/A 08/18/2013   Procedure: CARDIOVERSION;  Surgeon: Donato Schultz, MD;  Location: [facility] ENDOSCOPY;  Service: Cardiovascular;  Laterality: N/A;   CARDIOVERSION N/A 08/29/2013   Procedure: CARDIOVERSION;  Surgeon: Pricilla Riffle, MD;  Location: [facility] OR;  Service: Cardiovascular;  Laterality: N/A;   ELECTROPHYSIOLOGIC STUDY N/A 11/05/2014   Procedure: Atrial Fibrillation Ablation;  Surgeon: Hillis Range, MD;  Location: [facility] INVASIVE CV LAB;  Service: Cardiovascular;  Laterality: N/A;   EYE SURGERY Bilateral 1999   Corrective laser surgery   implantable loop recorder removal  10/02/2018   MDT Reveal LINQ removed in office by Dr Johney Frame for EOL battery   IR SACROPLASTY BILATERAL  02/06/2019   LEFT HEART CATHETERIZATION WITH CORONARY ANGIOGRAM N/A 07/17/2013   Procedure: LEFT HEART CATHETERIZATION WITH CORONARY ANGIOGRAM;  Surgeon: Micheline Chapman, MD;  Location: [facility] CATH LAB;  Service: Cardiovascular;  Laterality: N/A;   LOOP RECORDER IMPLANT N/A 06/05/2014   Procedure: LOOP RECORDER IMPLANT;  Surgeon: Hillis Range, MD;  Location: [facility] CATH LAB;  Service: Cardiovascular;  Laterality: N/A;   TEE WITHOUT CARDIOVERSION N/A 07/18/2013   Procedure: TRANSESOPHAGEAL ECHOCARDIOGRAM (TEE);  Surgeon: Lewayne Bunting, MD;  Location: [facility] ENDOSCOPY;  Service: Cardiovascular;  Laterality: N/A;   TEE WITHOUT CARDIOVERSION N/A 08/18/2013   Procedure: TRANSESOPHAGEAL ECHOCARDIOGRAM (TEE);  Surgeon: Donato Schultz, MD;  Location: [facility] ENDOSCOPY;  Service: Cardiovascular;  Laterality: N/A;   TEE WITHOUT CARDIOVERSION N/A 10/27/2013   Procedure: TRANSESOPHAGEAL ECHOCARDIOGRAM (TEE);  Surgeon: Pricilla Riffle, MD;  Location: [facility] ENDOSCOPY;  Service: Cardiovascular;  Laterality: N/A;   TEE WITHOUT CARDIOVERSION N/A 11/04/2014   Procedure: TRANSESOPHAGEAL ECHOCARDIOGRAM (TEE);  Surgeon:  Chrystie Nose, MD;  Location: [facility] ENDOSCOPY;  Service: Cardiovascular;  Laterality: N/A;   Patient Active Problem List   Diagnosis Date Noted   Scoliosis, or kyphoscoliosis, idiopathic 11/09/2022   Osteoporosis 11/09/2022   Primary osteoarthritis of right hip 01/31/2022   Greater trochanteric bursitis of right hip 01/31/2022   History of cervical dysplasia    Capsulitis of metatarsophalangeal (MTP) joint of right foot 01/31/2021   Bilateral bunions 12/27/2020   Essential hypertension 09/10/2020   Generalized anxiety disorder 09/22/2019   Hypercholesterolemia 09/22/2019   Major depressive disorder 09/22/2019   Overweight 07/18/2019   Fracture of sacrum (HCC) 01/30/2019   History of lumbar spinal fusion 01/16/2019   Leg weakness, bilateral 11/28/2017   Loss of bladder control 11/28/2017   Lumbar radiculopathy 11/28/2017   Spinal stenosis at L4-L5 level 11/28/2017   Degenerative disc disease, lumbar 10/30/2017   Degenerative scoliosis 10/30/2017   Long term (current) use of anticoagulants 10/30/2017   NICM (nonischemic cardiomyopathy) 10/30/2017   Spondylolisthesis at L4-L5 level 10/30/2017   Fatigue 04/13/2014   Sinus bradycardia 08/28/2013   Coronary artery disease    Chronic systolic heart failure    Atrial fibrillation    History of asthma 07/21/2013   Moderate mitral regurgitation 07/17/2013    PCP: [name], MD   REFERRING PROVIDER: Sherryl Manges*   REFERRING DIAG:  [phone] (ICD-10-CM) - Spondylolisthesis of lumbar region  [phone] (ICD-10-CM) - Status post lumbar spine operation    Rationale for Evaluation and Treatment: Rehabilitation  THERAPY DIAG:  Other low back pain  Unsteadiness on feet  Left lumbar radiculopathy  Unsteady gait when walking  Weakness of both hips  ONSET DATE: Jan 15, 2023 Fusion L5/S1  SUBJECTIVE:                                                                                                                                                                                            SUBJECTIVE STATEMENT: Doing good today.    PERTINENT HISTORY:  01/15/23 Fusion L5/S1 with hardware removal.  Previous back surgery (L2/3, 3/4, 4/5 fusion); osteoporosis, CAD, chronic systolic heart failure, Afib, history bil foot fractures.  Sacroplasty.   PAIN:  Are you having pain? Yes: NPRS scale: 5/10 Pain location: sacrum Pain description: dull ache Aggravating factors: sitting too long, sitting on hard surfaces, sitting without  back support  Relieving factors: cushions, tylenol  PRECAUTIONS: Fall  RED FLAGS: None   WEIGHT BEARING RESTRICTIONS: No  FALLS:  Has patient fallen in last 6 months? No but feels very unsteady.    LIVING ENVIRONMENT: Lives with: lives with their spouse Lives in: House/apartment Stairs: No Has following equipment at home: Dan Humphreys - 2 wheeled  OCCUPATION: disabled  PLOF: Independent  PATIENT GOALS: regain my balance, hopefully improve pain  NEXT MD VISIT: March 2025  OBJECTIVE:   DIAGNOSTIC FINDINGS:  12/07/2022 CT Lumbar Spine (PRE-OP) IMPRESSION: 1. Previous posterior decompression and discectomy at L2-3. Right-sided L3 pedicle screw does breach the superior endplate and enter the lateral disc space. No evidence of motion of either of the L3 screws. There is mild lucency and surrounding sclerosis at the left L2 screw. Possibility of mild screw motion does exist, but this does not appear pronounced. 2. Previous posterior decompression, diskectomy and fusion at L3-4 and L4-5. No evidence of motion or hardware failure. 3. L5-S1: Bulging of the disc. Bilateral facet arthropathy worse on the left than the right. Left foraminal stenosis primarily due to osteophytic encroachment from facet arthropathy in combination with endplate osteophytes and bulging disc that could affect the exiting left L5 nerve.  PATIENT SURVEYS:  Modified Oswestry 19/50   COGNITION: Overall  cognitive status: Within functional limits for tasks assessed     SENSATION: Diminished to light touch L S1 dermatome.  Reports tingling in L foot still after surgery.   MUSCLE LENGTH: NT  POSTURE:  decreased cervical lordosis  PALPATION: NA  LUMBAR ROM:   AROM eval  Flexion   Extension   Right lateral flexion   Left lateral flexion   Right rotation   Left rotation    (Blank rows = not tested)  LOWER EXTREMITY ROM:      NT today  LOWER EXTREMITY MMT:    MMT Right* eval Left* eval  Hip flexion 4+ 4  Hip extension    Hip abduction 5 5  Hip adduction 5 5  Knee flexion 5 5  Knee extension 5 5  Ankle dorsiflexion 5 5  Ankle plantarflexion 5 WB 5 WB   (Blank rows = not tested) * tested in sitting  FUNCTIONAL TESTS:  5 times sit to stand: 19.5 sec with bil UE assist (needed for eccentric control due to sacral pain) Functional gait assessment: 12/30 MCTSIB: Condition 1: Avg of 3 trials: 30 sec, Condition 2: Avg of 3 trials: 30 sec, Condition 3: Avg of 3 trials: 30 sec, Condition 4: Avg of 3 trials: 30 sec, and Total Score: 120/120   GAIT: Distance walked: 200' Assistive device utilized: None Level of assistance: Complete Independence Comments: occasionally unsteady, visually slow gait speed.   TODAY'S TREATMENT:                                                                                                                              DATE: 03/29/23 Therapeutic  Exercise: to improve strength and mobility.  Demo, verbal and tactile cues throughout for technique.  Bike L2x41min Bridge + RTB 2x10 NEUROMUSCULAR RE-EDUCATION: To improve posture, balance, and kinesthesia. Supine ab sets with orange pball 15x3" Bird dog x 10- starting with feet on table, cues for coordinated movement Supine sequential LE march and lower for core activation x 10 Bridge straight leg orange pball x 10  Manual Therapy: to decrease muscle spasm, pain and improve mobility.  STM to L  soleus/gastroc complex  03/20/23 Therapeutic Exercise: to improve strength and mobility.  Demo, verbal and tactile cues throughout for technique.  Supine PPT 2x10 Supine march + TRA RTB 2x10 S/L clamshells RTB 12x3" bil Bridge + RTB x 10  NEUROMUSCULAR RE-EDUCATION: To improve posture, balance, and kinesthesia. Clock balance R/L Standing march with no UE support  Step ups no UE support 6' x 10   03/15/23 Recumbent bike L2x54min [employer] pews x 15 Tandem stance 2x30" bil Supine pelvic tilt 10x3" Supine PPT + hip ADD 15x3" Supine RTB clams with TrA 15x3" Supine RTB march with TrA 15x B HS curl with ornage pball 03/12/23 EVAL    PATIENT EDUCATION:  Education details: findings, POC Person educated: Patient Education method: Explanation Education comprehension: verbalized understanding  HOME EXERCISE PROGRAM: Access Code: [email or web address] URL: [email or web address] Date: 03/29/2023 Prepared by: Verta Ellen  Exercises - Supine Posterior Pelvic Tilt  - 1 x daily - 7 x weekly - 2 sets - 10 reps - Supine Bridge with Resistance Band  - 1 x daily - 7 x weekly - 3 sets - 10 reps - Clamshell with Resistance  - 1 x daily - 7 x weekly - 3 sets - 10 reps  ASSESSMENT:  CLINICAL IMPRESSION: Continued with strengthening interventions to improve core activation and stability. Progressed to more challenging interventions to increased core activation. Progressed HEP for strengthening, she needs supervision with the core strength interventions so did not add these exercises to HEP yet. She was noting increased muscle tension in her L gastroc, which inhibits her walking and balance, so did a little STM as well to address this. Pt showed a good response and continues to benefit from skilled therapy.    OBJECTIVE IMPAIRMENTS: Abnormal gait, decreased activity tolerance, decreased balance, decreased endurance, difficulty walking, decreased strength, impaired perceived functional ability, and  pain.   ACTIVITY LIMITATIONS: carrying, lifting, bending, standing, squatting, sleeping, transfers, and locomotion level  PARTICIPATION LIMITATIONS: meal prep, cleaning, laundry, shopping, community activity, and yard work  PERSONAL FACTORS: Behavior pattern, Fitness, Past/current experiences, and 3+ comorbidities: Previous back surgery (L2/3, 3/4, 4/5 fusion); osteoporosis, CAD, chronic systolic heart failure, Afib, history bil foot fractures  are also affecting patient's functional outcome.   REHAB POTENTIAL: Good  CLINICAL DECISION MAKING: Evolving/moderate complexity  EVALUATION COMPLEXITY: Moderate   GOALS: Goals reviewed with patient? Yes  SHORT TERM GOALS: Target date: 03/26/2023   Patient will be independent with initial HEP.  Baseline: TBD Goal status: IN PROGRESS   LONG TERM GOALS: Target date: 05/21/2023   Patient will be independent with advanced/ongoing HEP to improve outcomes and carryover.  Baseline:  Goal status: IN PROGRESS  2.  Patient will report 75% improvement in low back/sacral pain to improve QOL.  Baseline: constant dull ache Goal status: IN PROGRESS  3.  Patient will demonstrate improved functional strength as demonstrated by 5x STS < 15 sec to decrease fall risk. Baseline: 19.5 sec with bil UE assist Goal status: IN PROGRESS  4.  Patient will report  at least 6 points improvement on modified Oswestry to demonstrate improved functional ability.  Baseline: 19/50 Goal status: IN PROGRESS   5.  Patient will score at least 19/30 on FGA to decrease risk of falls with injury. Baseline: 12/30 Goal status: IN PROGRESS  PLAN:  PT FREQUENCY: 2x/week  PT DURATION: 10 weeks  PLANNED INTERVENTIONS: 97110-Therapeutic exercises, 97530- Therapeutic activity, 97112- Neuromuscular re-education, 97535- Self Care, 78295- Manual therapy, [phone]- Gait training, 97014- Electrical stimulation (unattended), [phone]- Electrical stimulation (manual), [id number]- Ultrasound,  Patient/Family education, Balance training, Stair training, Taping, Dry Needling, Joint mobilization, Joint manipulation, Spinal mobilization, Vestibular training, Cryotherapy, and Moist heat.  PLAN FOR NEXT SESSION: core strengthening, hip strengthening, balance training, check hip mobility/hamstring/quad tightness, caution- sacral pain.    Darleene Cleaver, PTA 03/29/2023, 11:51 AM

## 2023-04-03 ENCOUNTER — Encounter: Payer: Self-pay | Admitting: Physical Therapy

## 2023-04-03 ENCOUNTER — Ambulatory Visit: Payer: 59 | Admitting: Physical Therapy

## 2023-04-03 DIAGNOSIS — M5459 Other low back pain: Secondary | ICD-10-CM

## 2023-04-03 DIAGNOSIS — R2681 Unsteadiness on feet: Secondary | ICD-10-CM

## 2023-04-03 IMAGING — CT CT HEART MORPH/PULM VEIN W/ CM & W/O CA SCORE
2 of 6 series · 12 of 20 positions shown, 14 images · IV contrast (Omni 300)
Comparison: None

Addendum:
CLINICAL DATA: Atrial fibrillation scheduled for ablation.

EXAM:
Cardiac CTA
TECHNIQUE: A non-contrast, gated CT scan was obtained with axial slices of 3 mm
through the heart for calcium scoring. Calcium scoring was performed
using the Agatston method. A 120 kV retrospective, gated, contrast
cardiac scan was obtained. Gantry rotation speed was 250 msecs and
collimation was 0.6 mm. Nitroglycerin was not given. A delayed scan
was obtained to exclude left atrial appendage thrombus. The 3D
dataset was reconstructed in 5% intervals of the 0-95% of the R-R
cycle. Late systolic phases were analyzed on a dedicated workstation
using MPR, MIP, and VRT modes. The patient received 80 cc of
contrast.

[Series 8: 0-90% · axial · 0.38mm/px · z∈[+1336,+1434]mm · 6 of 2740 slices shown, 8 images]
[im 392/2740  vessel]
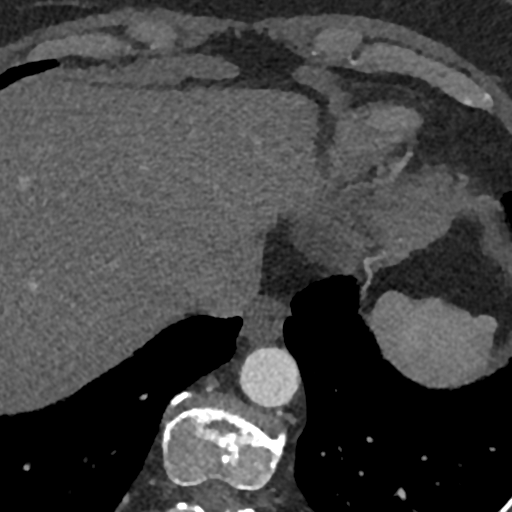
[im 392/2740  lung]
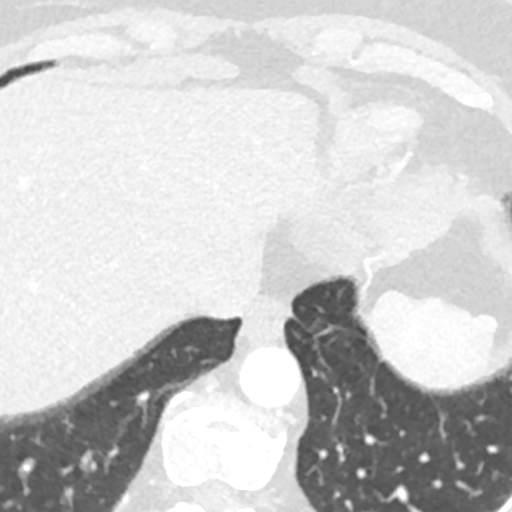
[im 783/2740  vessel]
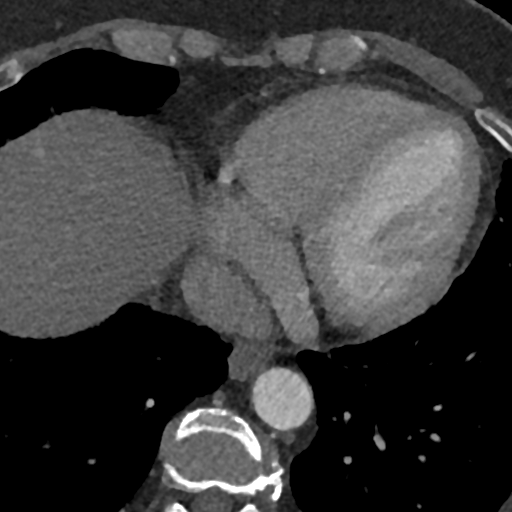
[im 1174/2740  vessel]
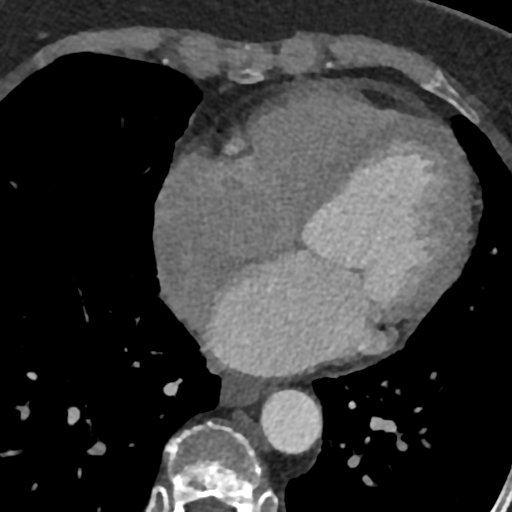
[im 1566/2740  vessel]
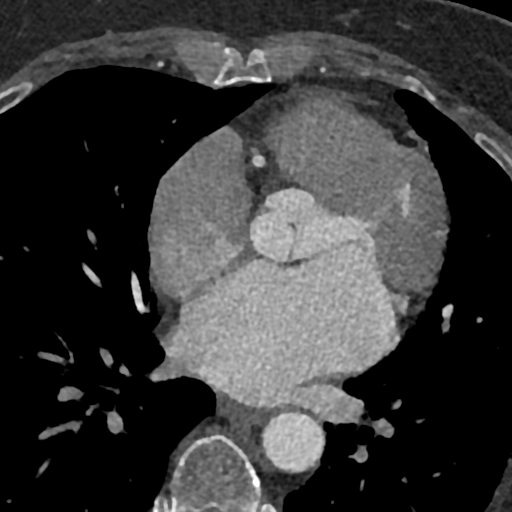
[im 1957/2740  vessel]
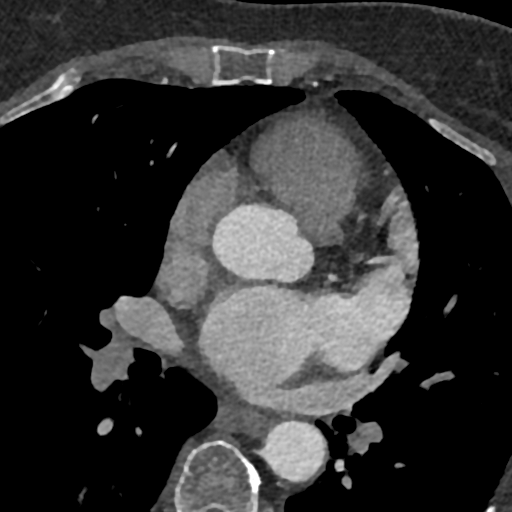
[im 1957/2740  lung]
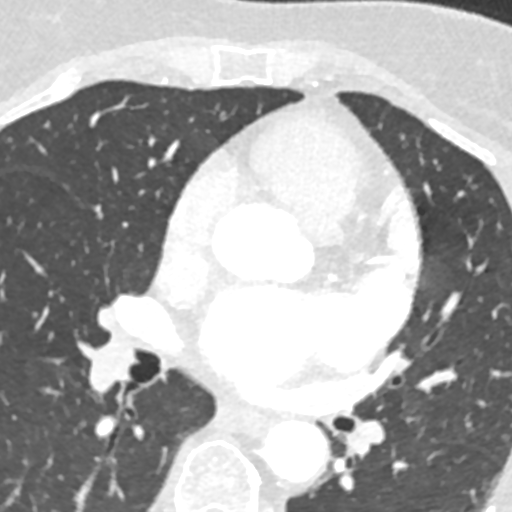
[im 2348/2740  vessel]
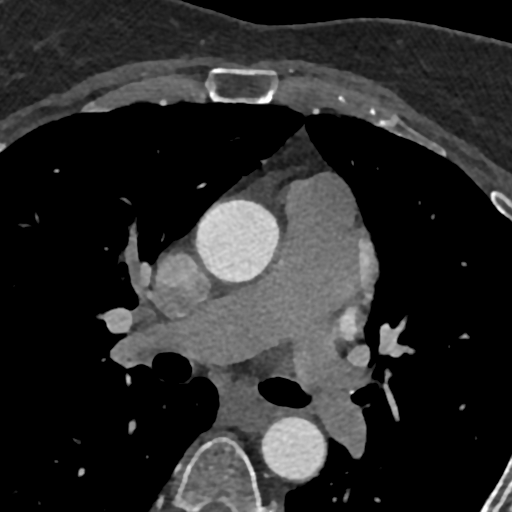

[Series 13: 5-95% · axial · 0.82mm/px · z∈[+1336,+1434]mm · 6 of 2740 slices shown]
[im 392/2740  vessel]
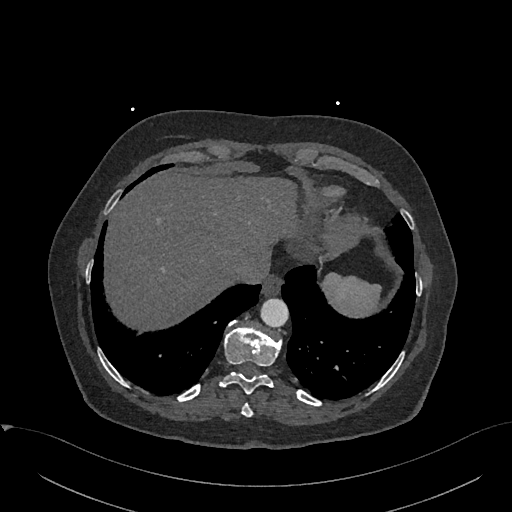
[im 783/2740  vessel]
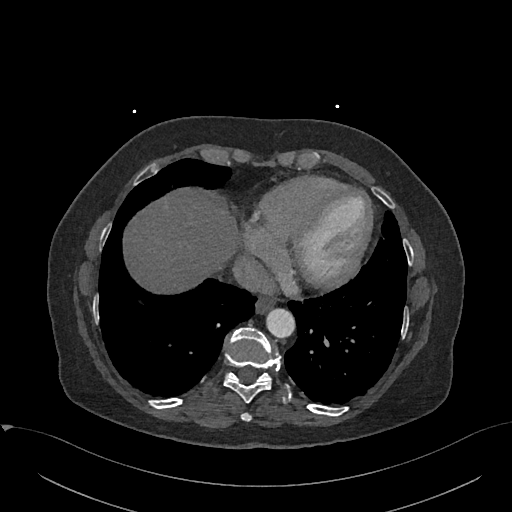
[im 1174/2740  vessel]
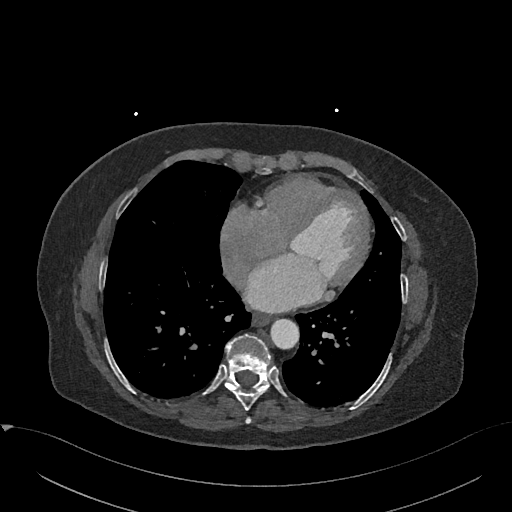
[im 1566/2740  vessel]
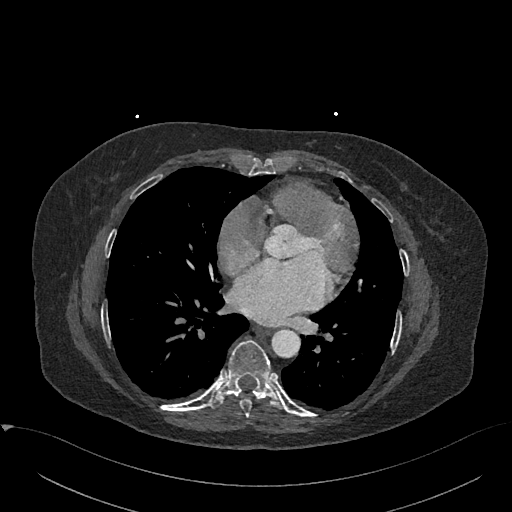
[im 1957/2740  vessel]
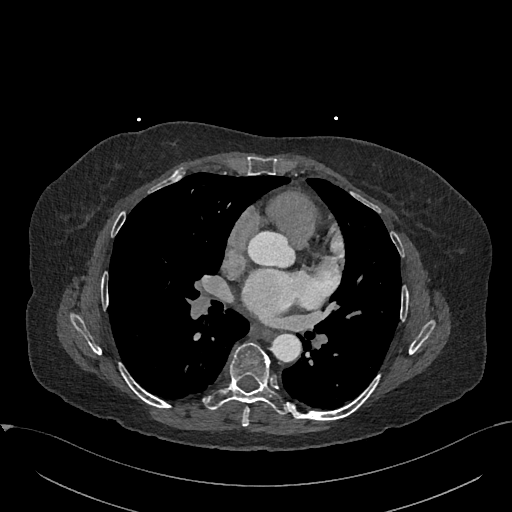
[im 2348/2740  vessel]
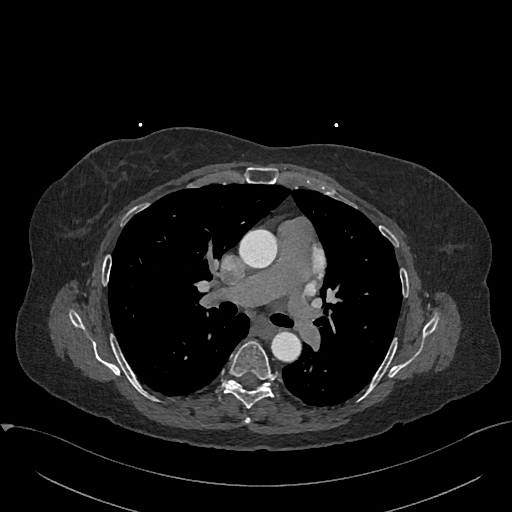

[12 of 20 positions shown; findings below may reference images not displayed]

FINDINGS: Image quality: Excellent.

Noise artifact is: Limited.

Pulmonary Veins: Normal variant of the pulmonary veins drainage into
the left atrium (3 on the right and 2 on the left) with ostial
measurements as follows:

RUPV: Ostium 23.7 mm x 13.8 mm  area 2.52 cm2

RLPV:  Ostium 15.6 mm x 13.4 mm  area 1.42 cm2

RMPV: Ostium 13.7 mm x 10.2 mm  area 0.96 cm2

LUPV:  Ostium 17.5 mm x 13.9 mm area 1.67 cm2

LLPV:  Ostium 18.0 mm x 13.6 mm  area 1.75 cm2

Left Atrium: The left atrial size is normal. There is no PFO/ASD.
The left atrial appendage is large chicken wing type. There is no
thrombus in the left atrial appendage on contrast or delayed
imaging. The esophagus runs in the left atrial midline and is not in
proximity to any of the pulmonary vein ostia. The esophagus runs in
the midline of the left atrium and is in close proximity to the Left
upper pulmonary vein.

Coronary Arteries: CAC score of 0, which is 0 percentile for age-,
race-, and sex-matched controls. Normal coronary origin. Right
dominance. The study was performed without use of NTG and is
insufficient for plaque evaluation.

Right Atrium: Right atrial size is within normal limits.

Right Ventricle: The right ventricular cavity is within normal
limits.

Left Ventricle: The ventricular cavity size is within normal limits.
There are no stigmata of prior infarction. There is no abnormal
filling defect.

Pericardium: Normal thickness with no significant effusion or
calcium present.

Pulmonary Artery: Normal caliber without proximal filling defect.

Cardiac valves: The aortic valve is trileaflet without significant
calcification. The mitral valve is normal structure without
significant calcification.

Aorta: Normal caliber with no significant disease.

Extra-cardiac findings: See attached radiology report for
non-cardiac structures.
IMPRESSION: 1. Normal variant pulmonary vein drainage into the left atrium with
ostial measurements above.

2. There is no thrombus in the left atrial appendage.

3. The esophagus runs in the left atrial midline and is not in
proximity to any of the pulmonary vein ostia.

4. No PFO/ASD.

5. Normal coronary origin. Right dominance.

6. CAC score of 0 which is 0 percentile for age-, race-, and
sex-matched controls.

Islas Balboa, DO

The Non-cardiac portion of this study will be over-read by the
radiologist.

EXAM:
OVER-READ INTERPRETATION  CT CHEST

The following report is an over-read performed by radiologist Dr.
over-read does not include interpretation of cardiac or coronary
anatomy or pathology. The coronary calcium and contrasted cardiac
study interpretation by the cardiologist is attached.
FINDINGS: Cardiovascular: Please see dedicated cardiac report for further
detail regarding cardiac findings.

Mediastinum/Nodes: No adenopathy in the visualized chest. Esophagus
grossly normal by CT.

Lungs/Pleura: Mild basilar atelectasis. No effusion. No
consolidative process. Visualized airways are patent.

Upper Abdomen: Hepatic steatosis, partially imaged. No acute upper
abdominal findings.

Musculoskeletal: No acute bone finding. No destructive bone process.
Spinal degenerative changes.
IMPRESSION: Hepatic steatosis. No acute or significant extracardiac findings
otherwise.

*** End of Addendum ***
FINDINGS: Image quality: Excellent.

Noise artifact is: Limited.

Pulmonary Veins: Normal variant of the pulmonary veins drainage into
the left atrium (3 on the right and 2 on the left) with ostial
measurements as follows:

RUPV: Ostium 23.7 mm x 13.8 mm  area 2.52 cm2

RLPV:  Ostium 15.6 mm x 13.4 mm  area 1.42 cm2

RMPV: Ostium 13.7 mm x 10.2 mm  area 0.96 cm2

LUPV:  Ostium 17.5 mm x 13.9 mm area 1.67 cm2

LLPV:  Ostium 18.0 mm x 13.6 mm  area 1.75 cm2

Left Atrium: The left atrial size is normal. There is no PFO/ASD.
The left atrial appendage is large chicken wing type. There is no
thrombus in the left atrial appendage on contrast or delayed
imaging. The esophagus runs in the left atrial midline and is not in
proximity to any of the pulmonary vein ostia. The esophagus runs in
the midline of the left atrium and is in close proximity to the Left
upper pulmonary vein.

Coronary Arteries: CAC score of 0, which is 0 percentile for age-,
race-, and sex-matched controls. Normal coronary origin. Right
dominance. The study was performed without use of NTG and is
insufficient for plaque evaluation.

Right Atrium: Right atrial size is within normal limits.

Right Ventricle: The right ventricular cavity is within normal
limits.

Left Ventricle: The ventricular cavity size is within normal limits.
There are no stigmata of prior infarction. There is no abnormal
filling defect.

Pericardium: Normal thickness with no significant effusion or
calcium present.

Pulmonary Artery: Normal caliber without proximal filling defect.

Cardiac valves: The aortic valve is trileaflet without significant
calcification. The mitral valve is normal structure without
significant calcification.

Aorta: Normal caliber with no significant disease.

Extra-cardiac findings: See attached radiology report for
non-cardiac structures.
IMPRESSION: 1. Normal variant pulmonary vein drainage into the left atrium with
ostial measurements above.

2. There is no thrombus in the left atrial appendage.

3. The esophagus runs in the left atrial midline and is not in
proximity to any of the pulmonary vein ostia.

4. No PFO/ASD.

5. Normal coronary origin. Right dominance.

6. CAC score of 0 which is 0 percentile for age-, race-, and
sex-matched controls.

Islas Balboa, DO

The Non-cardiac portion of this study will be over-read by the
radiologist.

## 2023-04-03 NOTE — Therapy (Signed)
OUTPATIENT PHYSICAL THERAPY TREATMENT   Patient Name: MARIEA MCMARTIN MRN: [id number] DOB:25-Nov-1960, 63 y.o., female Today's Date: 04/03/2023  END OF SESSION:  PT End of Session - 04/03/23 1146     Visit Number 5    Date for PT Re-Evaluation 05/21/23    Authorization Type [employer]    Progress Note Due on Visit 10    PT Start Time 1146    PT Stop Time 1234    PT Time Calculation (min) 48 min    Activity Tolerance Patient tolerated treatment well    Behavior During Therapy WFL for tasks assessed/performed                Past Medical History:  Diagnosis Date   Atrial fibrillation    a. admx with AFib with RVR and acute systolic CHF 07/2013; TEE with LAA clot and no DCCV done;     Bilateral bunions 12/27/2020   Capsulitis of metatarsophalangeal (MTP) joint of right foot 01/31/2021   Chronic systolic heart failure    Coronary artery disease    a.  LHC (07/17/13):  LM 20%, ostial CFX 20-30%   Degenerative disc disease, lumbar 10/30/2017   Degenerative scoliosis 10/30/2017   Essential hypertension 09/10/2020   Fatigue 04/13/2014   Generalized anxiety disorder 09/22/2019   History of asthma 07/21/2013   History of cervical dysplasia 12/22/2021   History of kidney stones 2013   History of lumbar spinal fusion 01/16/2019   Hypercholesterolemia 09/22/2019   Hyperlipemia    Hypothyroidism    past hx   Leg weakness, bilateral 11/28/2017   Long term (current) use of anticoagulants 10/30/2017   Loss of bladder control 11/28/2017   Lumbar radiculopathy 11/28/2017   Major depressive disorder 09/22/2019   Moderate mitral regurgitation 07/17/2013   NICM (nonischemic cardiomyopathy)    a. Echo (07/17/13):  EF 25-30%, EF subsequently normalized with sinus rhythm (tachycardia mediated CM)   Osteopenia    Overweight 07/18/2019   Sinus bradycardia 08/28/2013   Spondylolisthesis at L4-L5 level 10/30/2017   Past Surgical History:  Procedure Laterality Date   ABDOMINAL HYSTERECTOMY  ~  2001   ABLATION  10/28/13   PVI by Dr Johney Frame   APPENDECTOMY  1978   ATRIAL FIBRILLATION ABLATION N/A 10/28/2013   Procedure: ATRIAL FIBRILLATION ABLATION;  Surgeon: Gardiner Rhyme, MD;  Location: [facility] CATH LAB;  Service: Cardiovascular;  Laterality: N/A;   ATRIAL FIBRILLATION ABLATION N/A 11/23/2020   Procedure: ATRIAL FIBRILLATION ABLATION;  Surgeon: Hillis Range, MD;  Location: [facility] INVASIVE CV LAB;  Service: Cardiovascular;  Laterality: N/A;   BACK SURGERY Bilateral 2019   Fusion   CARDIAC CATHETERIZATION  07/2013   CARDIOVERSION N/A 08/18/2013   Procedure: CARDIOVERSION;  Surgeon: Donato Schultz, MD;  Location: [facility] ENDOSCOPY;  Service: Cardiovascular;  Laterality: N/A;   CARDIOVERSION N/A 08/29/2013   Procedure: CARDIOVERSION;  Surgeon: Pricilla Riffle, MD;  Location: [facility] OR;  Service: Cardiovascular;  Laterality: N/A;   ELECTROPHYSIOLOGIC STUDY N/A 11/05/2014   Procedure: Atrial Fibrillation Ablation;  Surgeon: Hillis Range, MD;  Location: [facility] INVASIVE CV LAB;  Service: Cardiovascular;  Laterality: N/A;   EYE SURGERY Bilateral 1999   Corrective laser surgery   implantable loop recorder removal  10/02/2018   MDT Reveal LINQ removed in office by Dr Johney Frame for EOL battery   IR SACROPLASTY BILATERAL  02/06/2019   LEFT HEART CATHETERIZATION WITH CORONARY ANGIOGRAM N/A 07/17/2013   Procedure: LEFT HEART CATHETERIZATION WITH CORONARY ANGIOGRAM;  Surgeon: Micheline Chapman, MD;  Location:  [facility] CATH LAB;  Service: Cardiovascular;  Laterality: N/A;   LOOP RECORDER IMPLANT N/A 06/05/2014   Procedure: LOOP RECORDER IMPLANT;  Surgeon: Hillis Range, MD;  Location: [facility] CATH LAB;  Service: Cardiovascular;  Laterality: N/A;   TEE WITHOUT CARDIOVERSION N/A 07/18/2013   Procedure: TRANSESOPHAGEAL ECHOCARDIOGRAM (TEE);  Surgeon: Lewayne Bunting, MD;  Location: [facility] ENDOSCOPY;  Service: Cardiovascular;  Laterality: N/A;   TEE WITHOUT CARDIOVERSION N/A 08/18/2013   Procedure: TRANSESOPHAGEAL ECHOCARDIOGRAM (TEE);  Surgeon: Donato Schultz, MD;  Location: [facility] ENDOSCOPY;  Service: Cardiovascular;  Laterality: N/A;   TEE WITHOUT CARDIOVERSION N/A 10/27/2013   Procedure: TRANSESOPHAGEAL ECHOCARDIOGRAM (TEE);  Surgeon: Pricilla Riffle, MD;  Location: [facility] ENDOSCOPY;  Service: Cardiovascular;  Laterality: N/A;   TEE WITHOUT CARDIOVERSION N/A 11/04/2014   Procedure: TRANSESOPHAGEAL ECHOCARDIOGRAM (TEE);  Surgeon: Chrystie Nose, MD;  Location: [facility] ENDOSCOPY;  Service: Cardiovascular;  Laterality: N/A;   Patient Active Problem List   Diagnosis Date Noted   Scoliosis, or kyphoscoliosis, idiopathic 11/09/2022   Osteoporosis 11/09/2022   Primary osteoarthritis of right hip 01/31/2022   Greater trochanteric bursitis of right hip 01/31/2022   History of cervical dysplasia    Capsulitis of metatarsophalangeal (MTP) joint of right foot 01/31/2021   Bilateral bunions 12/27/2020   Essential hypertension 09/10/2020   Generalized anxiety disorder 09/22/2019   Hypercholesterolemia 09/22/2019   Major depressive disorder 09/22/2019   Overweight 07/18/2019   Fracture of sacrum (HCC) 01/30/2019   History of lumbar spinal fusion 01/16/2019   Leg weakness, bilateral 11/28/2017   Loss of bladder control 11/28/2017   Lumbar radiculopathy 11/28/2017   Spinal stenosis at L4-L5 level 11/28/2017   Degenerative disc disease, lumbar 10/30/2017   Degenerative scoliosis 10/30/2017   Long term (current) use of anticoagulants 10/30/2017   NICM (nonischemic cardiomyopathy) 10/30/2017   Spondylolisthesis at L4-L5 level 10/30/2017   Fatigue 04/13/2014   Sinus bradycardia 08/28/2013   Coronary artery disease    Chronic systolic heart failure    Atrial fibrillation    History of asthma 07/21/2013   Moderate mitral regurgitation 07/17/2013    PCP: [name], MD   REFERRING PROVIDER: Sherryl Manges*   REFERRING DIAG:  [phone] (ICD-10-CM) - Spondylolisthesis of lumbar region  [phone] (ICD-10-CM) - Status post lumbar spine operation     Rationale for Evaluation and Treatment: Rehabilitation  THERAPY DIAG:  Other low back pain  Unsteadiness on feet  ONSET DATE: Jan 15, 2023 Fusion L5/S1  SUBJECTIVE:  SUBJECTIVE STATEMENT: The back is doing great, but she has an injury to left calf, went to MD at time and told strained the muscle, it has been acting up and hurting.  6/10 calf.   PERTINENT HISTORY:  01/15/23 Fusion L5/S1 with hardware removal.  Previous back surgery (L2/3, 3/4, 4/5 fusion); osteoporosis, CAD, chronic systolic heart failure, Afib, history bil foot fractures.  Sacroplasty.   PAIN:  Are you having pain? Yes: NPRS scale: 1/10 Pain location: sacrum Pain description: dull ache Aggravating factors: sitting too long, sitting on hard surfaces, sitting without back support  Relieving factors: cushions, tylenol  PRECAUTIONS: Fall  RED FLAGS: None   WEIGHT BEARING RESTRICTIONS: No  FALLS:  Has patient fallen in last 6 months? No but feels very unsteady.    LIVING ENVIRONMENT: Lives with: lives with their spouse Lives in: House/apartment Stairs: No Has following equipment at home: Dan Humphreys - 2 wheeled  OCCUPATION: disabled  PLOF: Independent  PATIENT GOALS: regain my balance, hopefully improve pain  NEXT MD VISIT: March 2025  OBJECTIVE:   DIAGNOSTIC FINDINGS:  12/07/2022 CT Lumbar Spine (PRE-OP) IMPRESSION: 1. Previous posterior decompression and discectomy at L2-3. Right-sided L3 pedicle screw does breach the superior endplate and enter the lateral disc space. No evidence of motion of either of the L3 screws. There is mild lucency and surrounding sclerosis at the left L2 screw. Possibility of mild screw motion does exist, but this does not appear pronounced. 2. Previous posterior decompression, diskectomy  and fusion at L3-4 and L4-5. No evidence of motion or hardware failure. 3. L5-S1: Bulging of the disc. Bilateral facet arthropathy worse on the left than the right. Left foraminal stenosis primarily due to osteophytic encroachment from facet arthropathy in combination with endplate osteophytes and bulging disc that could affect the exiting left L5 nerve.  PATIENT SURVEYS:  Modified Oswestry 19/50   COGNITION: Overall cognitive status: Within functional limits for tasks assessed     SENSATION: Diminished to light touch L S1 dermatome.  Reports tingling in L foot still after surgery.   MUSCLE LENGTH: NT  POSTURE:  decreased cervical lordosis  PALPATION: NA  LUMBAR ROM:   AROM eval  Flexion   Extension   Right lateral flexion   Left lateral flexion   Right rotation   Left rotation    (Blank rows = not tested)  LOWER EXTREMITY ROM:      NT today  LOWER EXTREMITY MMT:    MMT Right* eval Left* eval  Hip flexion 4+ 4  Hip extension    Hip abduction 5 5  Hip adduction 5 5  Knee flexion 5 5  Knee extension 5 5  Ankle dorsiflexion 5 5  Ankle plantarflexion 5 WB 5 WB   (Blank rows = not tested) * tested in sitting  FUNCTIONAL TESTS:  5 times sit to stand: 19.5 sec with bil UE assist (needed for eccentric control due to sacral pain) Functional gait assessment: 12/30 MCTSIB: Condition 1: Avg of 3 trials: 30 sec, Condition 2: Avg of 3 trials: 30 sec, Condition 3: Avg of 3 trials: 30 sec, Condition 4: Avg of 3 trials: 30 sec, and Total Score: 120/120   GAIT: Distance walked: 200' Assistive device utilized: None Level of assistance: Complete Independence Comments: occasionally unsteady, visually slow gait speed.   TODAY'S TREATMENT:  DATE:  04/03/23 Therapeutic Exercise: to improve strength and mobility.  Demo, verbal and tactile cues  throughout for technique. Nustep L5 x 5 min Gastroc stretch 3 x 30 sec - towel roll under foot Soleus stretch 3 x 30 sec - towel roll under foot Neuromuscular Reeducation: for balance& coordination - close SBA to CGA for safety Star excursion pattern - forward, side, back, cross x 5 each side, mirror for feedback Stepping forward and reaching for stepping strategy x 10 each side Manual Therapy: to decrease muscle spasm and pain and improve mobility STM/TPR to L calf, skilled palpation and monitoring during dry needling.  Trigger Point Dry Needling  Initial Treatment: Pt instructed on Dry Needling rational, procedures, and possible side effects. Pt instructed to expect mild to moderate muscle soreness later in the day and/or into the next day.  Pt instructed in methods to reduce muscle soreness. Pt instructed to continue prescribed HEP. Patient was educated on signs and symptoms of infection and other risk factors and advised to seek medical attention should they occur.  Patient verbalized understanding of these instructions and education.   Patient Verbal Consent Given: Yes Education Handout Provided: Yes Muscles Treated: L gastroc/soleus med & lateral Electrical Stimulation Performed: No Treatment Response/Outcome: Twitch Response Elicited and Palpable Increase in Muscle Length   03/29/23 Therapeutic Exercise: to improve strength and mobility.  Demo, verbal and tactile cues throughout for technique.  Bike L2x48min Bridge + RTB 2x10 NEUROMUSCULAR RE-EDUCATION: To improve posture, balance, and kinesthesia. Supine ab sets with orange pball 15x3" Bird dog x 10- starting with feet on table, cues for coordinated movement Supine sequential LE march and lower for core activation x 10 Bridge straight leg orange pball x 10  Manual Therapy: to decrease muscle spasm, pain and improve mobility.  STM to L soleus/gastroc complex  03/20/23 Therapeutic Exercise: to improve strength and mobility.   Demo, verbal and tactile cues throughout for technique.  Supine PPT 2x10 Supine march + TRA RTB 2x10 S/L clamshells RTB 12x3" bil Bridge + RTB x 10  NEUROMUSCULAR RE-EDUCATION: To improve posture, balance, and kinesthesia. Clock balance R/L Standing march with no UE support  Step ups no UE support 6' x 10   03/15/23 Recumbent bike L2x56min [employer] pews x 15 Tandem stance 2x30" bil Supine pelvic tilt 10x3" Supine PPT + hip ADD 15x3" Supine RTB clams with TrA 15x3" Supine RTB march with TrA 15x B HS curl with ornage pball 03/12/23 EVAL    PATIENT EDUCATION:  Education details: HEP update, TrDN Person educated: Patient Education method: Explanation, Demonstration, Verbal cues, and Handouts Education comprehension: verbalized understanding  HOME EXERCISE PROGRAM: Access Code: [email or web address] URL: [email or web address] Date: 04/03/2023 Prepared by: Harrie Foreman  Exercises - Supine Posterior Pelvic Tilt  - 1 x daily - 7 x weekly - 2 sets - 10 reps - Supine Bridge with Resistance Band  - 1 x daily - 7 x weekly - 3 sets - 10 reps - Clamshell with Resistance  - 1 x daily - 7 x weekly - 3 sets - 10 reps - Standing Gastroc Stretch on Foam 1/2 Roll  - 1 x daily - 7 x weekly - 1 sets - 3 reps - 30 hold - Standing Gastroc Stretch  - 1 x daily - 7 x weekly - 1 sets - 3 reps - 30 sec hold  ASSESSMENT:  CLINICAL IMPRESSION: Notnamed O Mccaster continues to report pain in L calf which was very concerning to her, worried coming from back, so  initially focused on that, no signs of swelling, redness, or other signs of DVT, noted palpable TrP in her calf,  so after explanation of DN rational, procedures, outcomes and potential side effects, patient verbalized consent to DN treatment in conjunction with manual STM/DTM and TPR to reduce ttp/muscle tension. Muscles treated as indicated above. DN produced normal response with good twitches elicited resulting in palpable reduction in pain/ttp  and muscle tension, with patient noting less pain upon initiation of movement following DN. Pt educated to expect mild to moderate muscle soreness for up to 24-48 hrs and instructed to continue prescribed home exercise program and current activity level with pt verbalizing understanding of theses instructions.  Followed TrDN with gentle stretches, she reported no pain with gait afterwards.  Remainder of session focused on balance as she reported recent near fall.  She was very challenged by balance activities today, with frequent LOB but able to correct herself.  Sharlot Gowda continues to demonstrate potential for improvement and would benefit from continued skilled therapy to address impairments.       OBJECTIVE IMPAIRMENTS: Abnormal gait, decreased activity tolerance, decreased balance, decreased endurance, difficulty walking, decreased strength, impaired perceived functional ability, and pain.   ACTIVITY LIMITATIONS: carrying, lifting, bending, standing, squatting, sleeping, transfers, and locomotion level  PARTICIPATION LIMITATIONS: meal prep, cleaning, laundry, shopping, community activity, and yard work  PERSONAL FACTORS: Behavior pattern, Fitness, Past/current experiences, and 3+ comorbidities: Previous back surgery (L2/3, 3/4, 4/5 fusion); osteoporosis, CAD, chronic systolic heart failure, Afib, history bil foot fractures  are also affecting patient's functional outcome.   REHAB POTENTIAL: Good  CLINICAL DECISION MAKING: Evolving/moderate complexity  EVALUATION COMPLEXITY: Moderate   GOALS: Goals reviewed with patient? Yes  SHORT TERM GOALS: Target date: 03/26/2023   Patient will be independent with initial HEP.  Baseline: TBD Goal status: MET 04/03/23   LONG TERM GOALS: Target date: 05/21/2023   Patient will be independent with advanced/ongoing HEP to improve outcomes and carryover.  Baseline:  Goal status: IN PROGRESS 04/03/23 updated  2.  Patient will report 75% improvement  in low back/sacral pain to improve QOL.  Baseline: constant dull ache Goal status: IN PROGRESS 04/03/23 - sacral pain has improved significantly to 1/10  3.  Patient will demonstrate improved functional strength as demonstrated by 5x STS < 15 sec to decrease fall risk. Baseline: 19.5 sec with bil UE assist Goal status: IN PROGRESS  4.  Patient will report at least 6 points improvement on modified Oswestry to demonstrate improved functional ability.  Baseline: 19/50 Goal status: IN PROGRESS   5.  Patient will score at least 19/30 on FGA to decrease risk of falls with injury. Baseline: 12/30 Goal status: IN PROGRESS  PLAN:  PT FREQUENCY: 2x/week  PT DURATION: 10 weeks  PLANNED INTERVENTIONS: 97110-Therapeutic exercises, 97530- Therapeutic activity, 97112- Neuromuscular re-education, 97535- Self Care, 13086- Manual therapy, [phone]- Gait training, [id number]- Electrical stimulation (unattended), [phone]- Electrical stimulation (manual), [id number]- Ultrasound, Patient/Family education, Balance training, Stair training, Taping, Dry Needling, Joint mobilization, Joint manipulation, Spinal mobilization, Vestibular training, Cryotherapy, and Moist heat.  PLAN FOR NEXT SESSION: core strengthening, hip strengthening, balance training,   Jena Gauss, PT 04/03/2023, 1:30 PM

## 2023-04-03 NOTE — Patient Instructions (Signed)

## 2023-04-05 ENCOUNTER — Ambulatory Visit: Payer: 59

## 2023-04-05 DIAGNOSIS — M5459 Other low back pain: Secondary | ICD-10-CM | POA: Diagnosis not present

## 2023-04-05 DIAGNOSIS — R29898 Other symptoms and signs involving the musculoskeletal system: Secondary | ICD-10-CM

## 2023-04-05 DIAGNOSIS — M5416 Radiculopathy, lumbar region: Secondary | ICD-10-CM

## 2023-04-05 DIAGNOSIS — R2681 Unsteadiness on feet: Secondary | ICD-10-CM

## 2023-04-05 NOTE — Therapy (Signed)
OUTPATIENT PHYSICAL THERAPY TREATMENT   Patient Name: Kiara [name] MRN: [id number] DOB:Apr 12, 1960, 63 y.o., female Today's Date: 04/05/2023  END OF SESSION:  PT End of Session - 04/05/23 1249     Visit Number 6    Date for PT Re-Evaluation 05/21/23    Authorization Type [employer]    Progress Note Due on Visit 10    PT Start Time 1150    PT Stop Time 1232    PT Time Calculation (min) 42 min    Activity Tolerance Patient tolerated treatment well    Behavior During Therapy WFL for tasks assessed/performed                 Past Medical History:  Diagnosis Date   Atrial fibrillation    a. admx with AFib with RVR and acute systolic CHF 07/2013; TEE with LAA clot and no DCCV done;     Bilateral bunions 12/27/2020   Capsulitis of metatarsophalangeal (MTP) joint of right foot 01/31/2021   Chronic systolic heart failure    Coronary artery disease    a.  LHC (07/17/13):  LM 20%, ostial CFX 20-30%   Degenerative disc disease, lumbar 10/30/2017   Degenerative scoliosis 10/30/2017   Essential hypertension 09/10/2020   Fatigue 04/13/2014   Generalized anxiety disorder 09/22/2019   History of asthma 07/21/2013   History of cervical dysplasia 12/22/2021   History of kidney stones 2013   History of lumbar spinal fusion 01/16/2019   Hypercholesterolemia 09/22/2019   Hyperlipemia    Hypothyroidism    past hx   Leg weakness, bilateral 11/28/2017   Long term (current) use of anticoagulants 10/30/2017   Loss of bladder control 11/28/2017   Lumbar radiculopathy 11/28/2017   Major depressive disorder 09/22/2019   Moderate mitral regurgitation 07/17/2013   NICM (nonischemic cardiomyopathy)    a. Echo (07/17/13):  EF 25-30%, EF subsequently normalized with sinus rhythm (tachycardia mediated CM)   Osteopenia    Overweight 07/18/2019   Sinus bradycardia 08/28/2013   Spondylolisthesis at L4-L5 level 10/30/2017   Past Surgical History:  Procedure Laterality Date   ABDOMINAL HYSTERECTOMY  ~  2001   ABLATION  10/28/13   PVI by Dr Johney Frame   APPENDECTOMY  1978   ATRIAL FIBRILLATION ABLATION N/A 10/28/2013   Procedure: ATRIAL FIBRILLATION ABLATION;  Surgeon: Gardiner Rhyme, MD;  Location: [facility] CATH LAB;  Service: Cardiovascular;  Laterality: N/A;   ATRIAL FIBRILLATION ABLATION N/A 11/23/2020   Procedure: ATRIAL FIBRILLATION ABLATION;  Surgeon: Hillis Range, MD;  Location: [facility] INVASIVE CV LAB;  Service: Cardiovascular;  Laterality: N/A;   BACK SURGERY Bilateral 2019   Fusion   CARDIAC CATHETERIZATION  07/2013   CARDIOVERSION N/A 08/18/2013   Procedure: CARDIOVERSION;  Surgeon: Donato Schultz, MD;  Location: [facility] ENDOSCOPY;  Service: Cardiovascular;  Laterality: N/A;   CARDIOVERSION N/A 08/29/2013   Procedure: CARDIOVERSION;  Surgeon: Pricilla Riffle, MD;  Location: [facility] OR;  Service: Cardiovascular;  Laterality: N/A;   ELECTROPHYSIOLOGIC STUDY N/A 11/05/2014   Procedure: Atrial Fibrillation Ablation;  Surgeon: Hillis Range, MD;  Location: [facility] INVASIVE CV LAB;  Service: Cardiovascular;  Laterality: N/A;   EYE SURGERY Bilateral 1999   Corrective laser surgery   implantable loop recorder removal  10/02/2018   MDT Reveal LINQ removed in office by Dr Johney Frame for EOL battery   IR SACROPLASTY BILATERAL  02/06/2019   LEFT HEART CATHETERIZATION WITH CORONARY ANGIOGRAM N/A 07/17/2013   Procedure: LEFT HEART CATHETERIZATION WITH CORONARY ANGIOGRAM;  Surgeon: Micheline Chapman, MD;  Location: [facility] CATH LAB;  Service: Cardiovascular;  Laterality: N/A;   LOOP RECORDER IMPLANT N/A 06/05/2014   Procedure: LOOP RECORDER IMPLANT;  Surgeon: Hillis Range, MD;  Location: [facility] CATH LAB;  Service: Cardiovascular;  Laterality: N/A;   TEE WITHOUT CARDIOVERSION N/A 07/18/2013   Procedure: TRANSESOPHAGEAL ECHOCARDIOGRAM (TEE);  Surgeon: Lewayne Bunting, MD;  Location: [facility] ENDOSCOPY;  Service: Cardiovascular;  Laterality: N/A;   TEE WITHOUT CARDIOVERSION N/A 08/18/2013   Procedure: TRANSESOPHAGEAL ECHOCARDIOGRAM (TEE);  Surgeon: Donato Schultz, MD;  Location: [facility] ENDOSCOPY;  Service: Cardiovascular;  Laterality: N/A;   TEE WITHOUT CARDIOVERSION N/A 10/27/2013   Procedure: TRANSESOPHAGEAL ECHOCARDIOGRAM (TEE);  Surgeon: Pricilla Riffle, MD;  Location: [facility] ENDOSCOPY;  Service: Cardiovascular;  Laterality: N/A;   TEE WITHOUT CARDIOVERSION N/A 11/04/2014   Procedure: TRANSESOPHAGEAL ECHOCARDIOGRAM (TEE);  Surgeon: Chrystie Nose, MD;  Location: [facility] ENDOSCOPY;  Service: Cardiovascular;  Laterality: N/A;   Patient Active Problem List   Diagnosis Date Noted   Scoliosis, or kyphoscoliosis, idiopathic 11/09/2022   Osteoporosis 11/09/2022   Primary osteoarthritis of right hip 01/31/2022   Greater trochanteric bursitis of right hip 01/31/2022   History of cervical dysplasia    Capsulitis of metatarsophalangeal (MTP) joint of right foot 01/31/2021   Bilateral bunions 12/27/2020   Essential hypertension 09/10/2020   Generalized anxiety disorder 09/22/2019   Hypercholesterolemia 09/22/2019   Major depressive disorder 09/22/2019   Overweight 07/18/2019   Fracture of sacrum (HCC) 01/30/2019   History of lumbar spinal fusion 01/16/2019   Leg weakness, bilateral 11/28/2017   Loss of bladder control 11/28/2017   Lumbar radiculopathy 11/28/2017   Spinal stenosis at L4-L5 level 11/28/2017   Degenerative disc disease, lumbar 10/30/2017   Degenerative scoliosis 10/30/2017   Long term (current) use of anticoagulants 10/30/2017   NICM (nonischemic cardiomyopathy) 10/30/2017   Spondylolisthesis at L4-L5 level 10/30/2017   Fatigue 04/13/2014   Sinus bradycardia 08/28/2013   Coronary artery disease    Chronic systolic heart failure    Atrial fibrillation    History of asthma 07/21/2013   Moderate mitral regurgitation 07/17/2013    PCP: [name], MD   REFERRING PROVIDER: Sherryl Manges*   REFERRING DIAG:  [phone] (ICD-10-CM) - Spondylolisthesis of lumbar region  [phone] (ICD-10-CM) - Status post lumbar spine operation     Rationale for Evaluation and Treatment: Rehabilitation  THERAPY DIAG:  Other low back pain  Unsteadiness on feet  Left lumbar radiculopathy  Unsteady gait when walking  Weakness of both hips  ONSET DATE: Jan 15, 2023 Fusion L5/S1  SUBJECTIVE:  SUBJECTIVE STATEMENT: Still having pain in the lower leg, wants to get it checked out  PERTINENT HISTORY:  01/15/23 Fusion L5/S1 with hardware removal.  Previous back surgery (L2/3, 3/4, 4/5 fusion); osteoporosis, CAD, chronic systolic heart failure, Afib, history bil foot fractures.  Sacroplasty.   PAIN:  Are you having pain? Yes: NPRS scale: 1/10 Pain location: sacrum Pain description: dull ache Aggravating factors: sitting too long, sitting on hard surfaces, sitting without back support  Relieving factors: cushions, tylenol  PRECAUTIONS: Fall  RED FLAGS: None   WEIGHT BEARING RESTRICTIONS: No  FALLS:  Has patient fallen in last 6 months? No but feels very unsteady.    LIVING ENVIRONMENT: Lives with: lives with their spouse Lives in: House/apartment Stairs: No Has following equipment at home: Dan Humphreys - 2 wheeled  OCCUPATION: disabled  PLOF: Independent  PATIENT GOALS: regain my balance, hopefully improve pain  NEXT MD VISIT: March 2025  OBJECTIVE:   DIAGNOSTIC FINDINGS:  12/07/2022 CT Lumbar Spine (PRE-OP) IMPRESSION: 1. Previous posterior decompression and discectomy at L2-3. Right-sided L3 pedicle screw does breach the superior endplate and enter the lateral disc space. No evidence of motion of either of the L3 screws. There is mild lucency and surrounding sclerosis at the left L2 screw. Possibility of mild screw motion does exist, but this does not appear pronounced. 2. Previous posterior decompression, diskectomy and  fusion at L3-4 and L4-5. No evidence of motion or hardware failure. 3. L5-S1: Bulging of the disc. Bilateral facet arthropathy worse on the left than the right. Left foraminal stenosis primarily due to osteophytic encroachment from facet arthropathy in combination with endplate osteophytes and bulging disc that could affect the exiting left L5 nerve.  PATIENT SURVEYS:  Modified Oswestry 19/50   COGNITION: Overall cognitive status: Within functional limits for tasks assessed     SENSATION: Diminished to light touch L S1 dermatome.  Reports tingling in L foot still after surgery.   MUSCLE LENGTH: NT  POSTURE:  decreased cervical lordosis  PALPATION: NA  LUMBAR ROM:   AROM eval  Flexion   Extension   Right lateral flexion   Left lateral flexion   Right rotation   Left rotation    (Blank rows = not tested)  LOWER EXTREMITY ROM:      NT today  LOWER EXTREMITY MMT:    MMT Right* eval Left* eval  Hip flexion 4+ 4  Hip extension    Hip abduction 5 5  Hip adduction 5 5  Knee flexion 5 5  Knee extension 5 5  Ankle dorsiflexion 5 5  Ankle plantarflexion 5 WB 5 WB   (Blank rows = not tested) * tested in sitting  FUNCTIONAL TESTS:  5 times sit to stand: 19.5 sec with bil UE assist (needed for eccentric control due to sacral pain) Functional gait assessment: 12/30 MCTSIB: Condition 1: Avg of 3 trials: 30 sec, Condition 2: Avg of 3 trials: 30 sec, Condition 3: Avg of 3 trials: 30 sec, Condition 4: Avg of 3 trials: 30 sec, and Total Score: 120/120   GAIT: Distance walked: 200' Assistive device utilized: None Level of assistance: Complete Independence Comments: occasionally unsteady, visually slow gait speed.   TODAY'S TREATMENT:  DATE: 04/05/23 Therapeutic Exercise: to improve strength and mobility.  Demo, verbal and tactile cues throughout for  technique. Nustep L5 x 6 min Knee extension 10lb BLE x 20 Knee flexion 20lb BLE x 20  Neuromuscular Reeducation: for balance& coordination - close SBA to CGA for safety Star excursion pattern - forward, side, back, cross x 5 each side, mirror for feedback- LOB a couple of times Step ups no UE support x 10  Toe taps 8' x 10 bil Standing bird dog x 10 bil Braiding for 5 ft- cues for step length  04/03/23 Therapeutic Exercise: to improve strength and mobility.  Demo, verbal and tactile cues throughout for technique. Nustep L5 x 5 min Gastroc stretch 3 x 30 sec - towel roll under foot Soleus stretch 3 x 30 sec - towel roll under foot Neuromuscular Reeducation: for balance& coordination - close SBA to CGA for safety Star excursion pattern - forward, side, back, cross x 5 each side, mirror for feedback Stepping forward and reaching for stepping strategy x 10 each side Manual Therapy: to decrease muscle spasm and pain and improve mobility STM/TPR to L calf, skilled palpation and monitoring during dry needling.  Trigger Point Dry Needling  Initial Treatment: Pt instructed on Dry Needling rational, procedures, and possible side effects. Pt instructed to expect mild to moderate muscle soreness later in the day and/or into the next day.  Pt instructed in methods to reduce muscle soreness. Pt instructed to continue prescribed HEP. Patient was educated on signs and symptoms of infection and other risk factors and advised to seek medical attention should they occur.  Patient verbalized understanding of these instructions and education.   Patient Verbal Consent Given: Yes Education Handout Provided: Yes Muscles Treated: L gastroc/soleus med & lateral Electrical Stimulation Performed: No Treatment Response/Outcome: Twitch Response Elicited and Palpable Increase in Muscle Length   03/29/23 Therapeutic Exercise: to improve strength and mobility.  Demo, verbal and tactile cues throughout for  technique.  Bike L2x23min Bridge + RTB 2x10 NEUROMUSCULAR RE-EDUCATION: To improve posture, balance, and kinesthesia. Supine ab sets with orange pball 15x3" Bird dog x 10- starting with feet on table, cues for coordinated movement Supine sequential LE march and lower for core activation x 10 Bridge straight leg orange pball x 10  Manual Therapy: to decrease muscle spasm, pain and improve mobility.  STM to L soleus/gastroc complex  03/20/23 Therapeutic Exercise: to improve strength and mobility.  Demo, verbal and tactile cues throughout for technique.  Supine PPT 2x10 Supine march + TRA RTB 2x10 S/L clamshells RTB 12x3" bil Bridge + RTB x 10  NEUROMUSCULAR RE-EDUCATION: To improve posture, balance, and kinesthesia. Clock balance R/L Standing march with no UE support  Step ups no UE support 6' x 10   03/15/23 Recumbent bike L2x71min [employer] pews x 15 Tandem stance 2x30" bil Supine pelvic tilt 10x3" Supine PPT + hip ADD 15x3" Supine RTB clams with TrA 15x3" Supine RTB march with TrA 15x B HS curl with ornage pball 03/12/23 EVAL    PATIENT EDUCATION:  Education details: HEP update, TrDN Person educated: Patient Education method: Explanation, Demonstration, Verbal cues, and Handouts Education comprehension: verbalized understanding  HOME EXERCISE PROGRAM: Access Code: [email or web address] URL: [email or web address] Date: 04/03/2023 Prepared by: Harrie Foreman  Exercises - Supine Posterior Pelvic Tilt  - 1 x daily - 7 x weekly - 2 sets - 10 reps - Supine Bridge with Resistance Band  - 1 x daily - 7 x weekly - 3 sets - 10  reps - Clamshell with Resistance  - 1 x daily - 7 x weekly - 3 sets - 10 reps - Standing Gastroc Stretch on Foam 1/2 Roll  - 1 x daily - 7 x weekly - 1 sets - 3 reps - 30 hold - Standing Gastroc Stretch  - 1 x daily - 7 x weekly - 1 sets - 3 reps - 30 sec hold  ASSESSMENT:  CLINICAL IMPRESSION:   Progressed patient through interventions to improve  balance stability and strength. Still showing instability with interventions required single limb support. Cues provided for form and CGA to maintain balance. Sharlot Gowda continues to demonstrate potential for improvement and would benefit from continued skilled therapy to address impairments.       OBJECTIVE IMPAIRMENTS: Abnormal gait, decreased activity tolerance, decreased balance, decreased endurance, difficulty walking, decreased strength, impaired perceived functional ability, and pain.   ACTIVITY LIMITATIONS: carrying, lifting, bending, standing, squatting, sleeping, transfers, and locomotion level  PARTICIPATION LIMITATIONS: meal prep, cleaning, laundry, shopping, community activity, and yard work  PERSONAL FACTORS: Behavior pattern, Fitness, Past/current experiences, and 3+ comorbidities: Previous back surgery (L2/3, 3/4, 4/5 fusion); osteoporosis, CAD, chronic systolic heart failure, Afib, history bil foot fractures  are also affecting patient's functional outcome.   REHAB POTENTIAL: Good  CLINICAL DECISION MAKING: Evolving/moderate complexity  EVALUATION COMPLEXITY: Moderate   GOALS: Goals reviewed with patient? Yes  SHORT TERM GOALS: Target date: 03/26/2023   Patient will be independent with initial HEP.  Baseline: TBD Goal status: MET 04/03/23   LONG TERM GOALS: Target date: 05/21/2023   Patient will be independent with advanced/ongoing HEP to improve outcomes and carryover.  Baseline:  Goal status: IN PROGRESS 04/03/23 updated  2.  Patient will report 75% improvement in low back/sacral pain to improve QOL.  Baseline: constant dull ache Goal status: IN PROGRESS 04/03/23 - sacral pain has improved significantly to 1/10  3.  Patient will demonstrate improved functional strength as demonstrated by 5x STS < 15 sec to decrease fall risk. Baseline: 19.5 sec with bil UE assist Goal status: IN PROGRESS  4.  Patient will report at least 6 points improvement on modified  Oswestry to demonstrate improved functional ability.  Baseline: 19/50 Goal status: IN PROGRESS   5.  Patient will score at least 19/30 on FGA to decrease risk of falls with injury. Baseline: 12/30 Goal status: IN PROGRESS  PLAN:  PT FREQUENCY: 2x/week  PT DURATION: 10 weeks  PLANNED INTERVENTIONS: 97110-Therapeutic exercises, 97530- Therapeutic activity, 97112- Neuromuscular re-education, 97535- Self Care, [id number]- Manual therapy, [phone]- Gait training, [id number]- Electrical stimulation (unattended), [phone]- Electrical stimulation (manual), [id number]- Ultrasound, Patient/Family education, Balance training, Stair training, Taping, Dry Needling, Joint mobilization, Joint manipulation, Spinal mobilization, Vestibular training, Cryotherapy, and Moist heat.  PLAN FOR NEXT SESSION: core strengthening, hip strengthening, balance training,   Darleene Cleaver, PTA 04/05/2023, 12:49 PM

## 2023-04-09 ENCOUNTER — Ambulatory Visit (INDEPENDENT_AMBULATORY_CARE_PROVIDER_SITE_OTHER): Payer: 59 | Admitting: Behavioral Health

## 2023-04-09 ENCOUNTER — Encounter: Payer: Self-pay | Admitting: Behavioral Health

## 2023-04-09 DIAGNOSIS — F4321 Adjustment disorder with depressed mood: Secondary | ICD-10-CM | POA: Diagnosis not present
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
## 2023-04-10 ENCOUNTER — Ambulatory Visit: Payer: 59 | Admitting: Physical Therapy

## 2023-04-10 ENCOUNTER — Encounter: Payer: Self-pay | Admitting: Physical Therapy

## 2023-04-10 DIAGNOSIS — R2681 Unsteadiness on feet: Secondary | ICD-10-CM

## 2023-04-10 DIAGNOSIS — M5459 Other low back pain: Secondary | ICD-10-CM

## 2023-04-10 NOTE — Therapy (Signed)
 OUTPATIENT PHYSICAL THERAPY TREATMENT   Patient Name: Kiara [name] MRN: [id number] DOB:08-15-1960, 63 y.o., female Today's Date: 04/10/2023  END OF SESSION:  PT End of Session - 04/10/23 1145     Visit Number 7    Date for PT Re-Evaluation 05/21/23    Authorization Type [employer]    Progress Note Due on Visit 10    PT Start Time 1149    PT Stop Time 1238    PT Time Calculation (min) 49 min    Activity Tolerance Patient tolerated treatment well    Behavior During Therapy WFL for tasks assessed/performed                 Past Medical History:  Diagnosis Date   Atrial fibrillation    a. admx with AFib with RVR and acute systolic CHF 07/2013; TEE with LAA clot and no DCCV done;     Bilateral bunions 12/27/2020   Capsulitis of metatarsophalangeal (MTP) joint of right foot 01/31/2021   Chronic systolic heart failure    Coronary artery disease    a.  LHC (07/17/13):  LM 20%, ostial CFX 20-30%   Degenerative disc disease, lumbar 10/30/2017   Degenerative scoliosis 10/30/2017   Essential hypertension 09/10/2020   Fatigue 04/13/2014   Generalized anxiety disorder 09/22/2019   History of asthma 07/21/2013   History of cervical dysplasia 12/22/2021   History of kidney stones 2013   History of lumbar spinal fusion 01/16/2019   Hypercholesterolemia 09/22/2019   Hyperlipemia    Hypothyroidism    past hx   Leg weakness, bilateral 11/28/2017   Long term (current) use of anticoagulants 10/30/2017   Loss of bladder control 11/28/2017   Lumbar radiculopathy 11/28/2017   Major depressive disorder 09/22/2019   Moderate mitral regurgitation 07/17/2013   NICM (nonischemic cardiomyopathy)    a. Echo (07/17/13):  EF 25-30%, EF subsequently normalized with sinus rhythm (tachycardia mediated CM)   Osteopenia    Overweight 07/18/2019   Sinus bradycardia 08/28/2013   Spondylolisthesis at L4-L5 level 10/30/2017   Past Surgical History:  Procedure Laterality Date   ABDOMINAL HYSTERECTOMY  ~  2001   ABLATION  10/28/13   PVI by Dr Johney Frame   APPENDECTOMY  1978   ATRIAL FIBRILLATION ABLATION N/A 10/28/2013   Procedure: ATRIAL FIBRILLATION ABLATION;  Surgeon: Gardiner Rhyme, MD;  Location: [facility] CATH LAB;  Service: Cardiovascular;  Laterality: N/A;   ATRIAL FIBRILLATION ABLATION N/A 11/23/2020   Procedure: ATRIAL FIBRILLATION ABLATION;  Surgeon: Hillis Range, MD;  Location: [facility] INVASIVE CV LAB;  Service: Cardiovascular;  Laterality: N/A;   BACK SURGERY Bilateral 2019   Fusion   CARDIAC CATHETERIZATION  07/2013   CARDIOVERSION N/A 08/18/2013   Procedure: CARDIOVERSION;  Surgeon: Donato Schultz, MD;  Location: [facility] ENDOSCOPY;  Service: Cardiovascular;  Laterality: N/A;   CARDIOVERSION N/A 08/29/2013   Procedure: CARDIOVERSION;  Surgeon: Pricilla Riffle, MD;  Location: [facility] OR;  Service: Cardiovascular;  Laterality: N/A;   ELECTROPHYSIOLOGIC STUDY N/A 11/05/2014   Procedure: Atrial Fibrillation Ablation;  Surgeon: Hillis Range, MD;  Location: [facility] INVASIVE CV LAB;  Service: Cardiovascular;  Laterality: N/A;   EYE SURGERY Bilateral 1999   Corrective laser surgery   implantable loop recorder removal  10/02/2018   MDT Reveal LINQ removed in office by Dr Johney Frame for EOL battery   IR SACROPLASTY BILATERAL  02/06/2019   LEFT HEART CATHETERIZATION WITH CORONARY ANGIOGRAM N/A 07/17/2013   Procedure: LEFT HEART CATHETERIZATION WITH CORONARY ANGIOGRAM;  Surgeon: Micheline Chapman, MD;  Location: [facility] CATH LAB;  Service: Cardiovascular;  Laterality: N/A;   LOOP RECORDER IMPLANT N/A 06/05/2014   Procedure: LOOP RECORDER IMPLANT;  Surgeon: Hillis Range, MD;  Location: [facility] CATH LAB;  Service: Cardiovascular;  Laterality: N/A;   TEE WITHOUT CARDIOVERSION N/A 07/18/2013   Procedure: TRANSESOPHAGEAL ECHOCARDIOGRAM (TEE);  Surgeon: Lewayne Bunting, MD;  Location: [facility] ENDOSCOPY;  Service: Cardiovascular;  Laterality: N/A;   TEE WITHOUT CARDIOVERSION N/A 08/18/2013   Procedure: TRANSESOPHAGEAL ECHOCARDIOGRAM (TEE);  Surgeon: Donato Schultz, MD;  Location: [facility] ENDOSCOPY;  Service: Cardiovascular;  Laterality: N/A;   TEE WITHOUT CARDIOVERSION N/A 10/27/2013   Procedure: TRANSESOPHAGEAL ECHOCARDIOGRAM (TEE);  Surgeon: Pricilla Riffle, MD;  Location: [facility] ENDOSCOPY;  Service: Cardiovascular;  Laterality: N/A;   TEE WITHOUT CARDIOVERSION N/A 11/04/2014   Procedure: TRANSESOPHAGEAL ECHOCARDIOGRAM (TEE);  Surgeon: Chrystie Nose, MD;  Location: [facility] ENDOSCOPY;  Service: Cardiovascular;  Laterality: N/A;   Patient Active Problem List   Diagnosis Date Noted   Scoliosis, or kyphoscoliosis, idiopathic 11/09/2022   Osteoporosis 11/09/2022   Primary osteoarthritis of right hip 01/31/2022   Greater trochanteric bursitis of right hip 01/31/2022   History of cervical dysplasia    Capsulitis of metatarsophalangeal (MTP) joint of right foot 01/31/2021   Bilateral bunions 12/27/2020   Essential hypertension 09/10/2020   Generalized anxiety disorder 09/22/2019   Hypercholesterolemia 09/22/2019   Major depressive disorder 09/22/2019   Overweight 07/18/2019   Fracture of sacrum (HCC) 01/30/2019   History of lumbar spinal fusion 01/16/2019   Leg weakness, bilateral 11/28/2017   Loss of bladder control 11/28/2017   Lumbar radiculopathy 11/28/2017   Spinal stenosis at L4-L5 level 11/28/2017   Degenerative disc disease, lumbar 10/30/2017   Degenerative scoliosis 10/30/2017   Long term (current) use of anticoagulants 10/30/2017   NICM (nonischemic cardiomyopathy) 10/30/2017   Spondylolisthesis at L4-L5 level 10/30/2017   Fatigue 04/13/2014   Sinus bradycardia 08/28/2013   Coronary artery disease    Chronic systolic heart failure    Atrial fibrillation    History of asthma 07/21/2013   Moderate mitral regurgitation 07/17/2013    PCP: [name], MD   REFERRING PROVIDER: Sherryl Manges*   REFERRING DIAG:  [phone] (ICD-10-CM) - Spondylolisthesis of lumbar region  [phone] (ICD-10-CM) - Status post lumbar spine operation     Rationale for Evaluation and Treatment: Rehabilitation  THERAPY DIAG:  Other low back pain  Unsteadiness on feet  ONSET DATE: Jan 15, 2023 Fusion L5/S1  SUBJECTIVE:  SUBJECTIVE STATEMENT: Leg is still bothering her, she was on her feet all weekend, but had to sit down a lot.    PERTINENT HISTORY:  01/15/23 Fusion L5/S1 with hardware removal.  Previous back surgery (L2/3, 3/4, 4/5 fusion); osteoporosis, CAD, chronic systolic heart failure, Afib, history bil foot fractures.  Sacroplasty.   PAIN:  Are you having pain? Yes: NPRS scale: 1/10 Pain location: sacrum Pain description: dull ache Aggravating factors: sitting too long, sitting on hard surfaces, sitting without back support  Relieving factors: cushions, tylenol  PRECAUTIONS: Fall  RED FLAGS: None   WEIGHT BEARING RESTRICTIONS: No  FALLS:  Has patient fallen in last 6 months? No but feels very unsteady.    LIVING ENVIRONMENT: Lives with: lives with their spouse Lives in: House/apartment Stairs: No Has following equipment at home: Dan Humphreys - 2 wheeled  OCCUPATION: disabled  PLOF: Independent  PATIENT GOALS: regain my balance, hopefully improve pain  NEXT MD VISIT: March 2025  OBJECTIVE:   DIAGNOSTIC FINDINGS:  12/07/2022 CT Lumbar Spine (PRE-OP) IMPRESSION: 1. Previous posterior decompression and discectomy at L2-3. Right-sided L3 pedicle screw does breach the superior endplate and enter the lateral disc space. No evidence of motion of either of the L3 screws. There is mild lucency and surrounding sclerosis at the left L2 screw. Possibility of mild screw motion does exist, but this does not appear pronounced. 2. Previous posterior decompression, diskectomy and fusion at L3-4 and L4-5. No evidence of motion or hardware  failure. 3. L5-S1: Bulging of the disc. Bilateral facet arthropathy worse on the left than the right. Left foraminal stenosis primarily due to osteophytic encroachment from facet arthropathy in combination with endplate osteophytes and bulging disc that could affect the exiting left L5 nerve.  PATIENT SURVEYS:  Modified Oswestry 19/50   COGNITION: Overall cognitive status: Within functional limits for tasks assessed     SENSATION: Diminished to light touch L S1 dermatome.  Reports tingling in L foot still after surgery.   MUSCLE LENGTH: NT  POSTURE:  decreased cervical lordosis  PALPATION: NA  LUMBAR ROM:   AROM eval  Flexion   Extension   Right lateral flexion   Left lateral flexion   Right rotation   Left rotation    (Blank rows = not tested)  LOWER EXTREMITY ROM:      NT today  LOWER EXTREMITY MMT:    MMT Right* eval Left* eval  Hip flexion 4+ 4  Hip extension    Hip abduction 5 5  Hip adduction 5 5  Knee flexion 5 5  Knee extension 5 5  Ankle dorsiflexion 5 5  Ankle plantarflexion 5 WB 5 WB   (Blank rows = not tested) * tested in sitting  FUNCTIONAL TESTS:  5 times sit to stand: 19.5 sec with bil UE assist (needed for eccentric control due to sacral pain) Functional gait assessment: 12/30 MCTSIB: Condition 1: Avg of 3 trials: 30 sec, Condition 2: Avg of 3 trials: 30 sec, Condition 3: Avg of 3 trials: 30 sec, Condition 4: Avg of 3 trials: 30 sec, and Total Score: 120/120   GAIT: Distance walked: 200' Assistive device utilized: None Level of assistance: Complete Independence Comments: occasionally unsteady, visually slow gait speed.   TODAY'S TREATMENT:  DATE:  04/10/23 Bike L2 x 5 min Neuromuscular Reeducation: for balance, coordination and proprioception With mirror for feedback and SBA for safety: Toe taps on stool  alternating Standing with one foot on stool  -Head turns   -head nods Forward steps over 1/2 foam roller x 10 bil  Side steps over 1/2 foam roller x 10 bil    04/05/23 Therapeutic Exercise: to improve strength and mobility.  Demo, verbal and tactile cues throughout for technique. Nustep L5 x 6 min Knee extension 10lb BLE x 20 Knee flexion 20lb BLE x 20  Neuromuscular Reeducation: for balance& coordination - close SBA to CGA for safety Star excursion pattern - forward, side, back, cross x 5 each side, mirror for feedback- LOB a couple of times Step ups no UE support x 10  Toe taps 8' x 10 bil Standing bird dog x 10 bil Braiding for 5 ft- cues for step length  04/03/23 Therapeutic Exercise: to improve strength and mobility.  Demo, verbal and tactile cues throughout for technique. Nustep L5 x 5 min Gastroc stretch 3 x 30 sec - towel roll under foot Soleus stretch 3 x 30 sec - towel roll under foot Neuromuscular Reeducation: for balance& coordination - close SBA to CGA for safety Star excursion pattern - forward, side, back, cross x 5 each side, mirror for feedback Stepping forward and reaching for stepping strategy x 10 each side Manual Therapy: to decrease muscle spasm and pain and improve mobility STM/TPR to L calf, skilled palpation and monitoring during dry needling.  Trigger Point Dry Needling  Initial Treatment: Pt instructed on Dry Needling rational, procedures, and possible side effects. Pt instructed to expect mild to moderate muscle soreness later in the day and/or into the next day.  Pt instructed in methods to reduce muscle soreness. Pt instructed to continue prescribed HEP. Patient was educated on signs and symptoms of infection and other risk factors and advised to seek medical attention should they occur.  Patient verbalized understanding of these instructions and education.   Patient Verbal Consent Given: Yes Education Handout Provided: Yes Muscles Treated: L  gastroc/soleus med & lateral Electrical Stimulation Performed: No Treatment Response/Outcome: Twitch Response Elicited and Palpable Increase in Muscle Length   03/29/23 Therapeutic Exercise: to improve strength and mobility.  Demo, verbal and tactile cues throughout for technique.  Bike L2x82min Bridge + RTB 2x10 NEUROMUSCULAR RE-EDUCATION: To improve posture, balance, and kinesthesia. Supine ab sets with orange pball 15x3" Bird dog x 10- starting with feet on table, cues for coordinated movement Supine sequential LE march and lower for core activation x 10 Bridge straight leg orange pball x 10  Manual Therapy: to decrease muscle spasm, pain and improve mobility.  STM to L soleus/gastroc complex    PATIENT EDUCATION:  Education details: HEP update Person educated: Patient Education method: Explanation, Demonstration, Verbal cues, and Handouts Education comprehension: verbalized understanding  HOME EXERCISE PROGRAM: Access Code: [email or web address] URL: [email or web address] Date: 04/10/2023 Prepared by: Harrie Foreman  Exercises - Supine Posterior Pelvic Tilt  - 1 x daily - 7 x weekly - 2 sets - 10 reps - Supine Bridge with Resistance Band  - 1 x daily - 7 x weekly - 3 sets - 10 reps - Clamshell with Resistance  - 1 x daily - 7 x weekly - 3 sets - 10 reps - Standing Gastroc Stretch on Foam 1/2 Roll  - 1 x daily - 7 x weekly - 1 sets - 3 reps - 30 hold - Standing  Gastroc Stretch  - 1 x daily - 7 x weekly - 1 sets - 3 reps - 30 sec hold - Standing Tandem Balance with Counter Support  - 1 x daily - 7 x weekly - 1 sets - 3 reps - 30 sec hold - Standing Single Leg Stance with Counter Support  - 1 x daily - 7 x weekly - 1 sets - 3 reps - 30 sec  hold  ASSESSMENT:  CLINICAL IMPRESSION: Brianny continues to have L calf pain, 5/10 which increases with calf squeeze although no redness or warmth, minimal improvement from previous session (TrDN benefit only lasted 2 hours).  At this  point did recommend contact MD and follow-up.  Today focused on balance, with stepping strategy and working on single leg stance.  Challenged today with weight shift onto LLE and needed cues throughout for posture, but no increase in L calf pain with activities.  Updated HEP with tandem stance and SLS at counter.  She will miss about ~ 1 week to recover from cataract surgery. Sharlot Gowda continues to demonstrate potential for improvement and would benefit from continued skilled therapy to address impairments.       OBJECTIVE IMPAIRMENTS: Abnormal gait, decreased activity tolerance, decreased balance, decreased endurance, difficulty walking, decreased strength, impaired perceived functional ability, and pain.   ACTIVITY LIMITATIONS: carrying, lifting, bending, standing, squatting, sleeping, transfers, and locomotion level  PARTICIPATION LIMITATIONS: meal prep, cleaning, laundry, shopping, community activity, and yard work  PERSONAL FACTORS: Behavior pattern, Fitness, Past/current experiences, and 3+ comorbidities: Previous back surgery (L2/3, 3/4, 4/5 fusion); osteoporosis, CAD, chronic systolic heart failure, Afib, history bil foot fractures  are also affecting patient's functional outcome.   REHAB POTENTIAL: Good  CLINICAL DECISION MAKING: Evolving/moderate complexity  EVALUATION COMPLEXITY: Moderate   GOALS: Goals reviewed with patient? Yes  SHORT TERM GOALS: Target date: 03/26/2023   Patient will be independent with initial HEP.  Baseline: TBD Goal status: MET 04/03/23   LONG TERM GOALS: Target date: 05/21/2023   Patient will be independent with advanced/ongoing HEP to improve outcomes and carryover.  Baseline:  Goal status: IN PROGRESS 04/03/23 updated  2.  Patient will report 75% improvement in low back/sacral pain to improve QOL.  Baseline: constant dull ache Goal status: IN PROGRESS 04/03/23 - sacral pain has improved significantly to 1/10  3.  Patient will demonstrate  improved functional strength as demonstrated by 5x STS < 15 sec to decrease fall risk. Baseline: 19.5 sec with bil UE assist Goal status: IN PROGRESS  4.  Patient will report at least 6 points improvement on modified Oswestry to demonstrate improved functional ability.  Baseline: 19/50 Goal status: IN PROGRESS   5.  Patient will score at least 19/30 on FGA to decrease risk of falls with injury. Baseline: 12/30 Goal status: IN PROGRESS  PLAN:  PT FREQUENCY: 2x/week  PT DURATION: 10 weeks  PLANNED INTERVENTIONS: 97110-Therapeutic exercises, 97530- Therapeutic activity, 97112- Neuromuscular re-education, 97535- Self Care, [id number]- Manual therapy, [phone]- Gait training, [id number]- Electrical stimulation (unattended), [phone]- Electrical stimulation (manual), [id number]- Ultrasound, Patient/Family education, Balance training, Stair training, Taping, Dry Needling, Joint mobilization, Joint manipulation, Spinal mobilization, Vestibular training, Cryotherapy, and Moist heat.  PLAN FOR NEXT SESSION: core strengthening, hip strengthening, balance training,   Jena Gauss, PT 04/10/2023, 1:31 PM

## 2023-04-17 ENCOUNTER — Encounter: Payer: 59 | Admitting: Physical Therapy

## 2023-04-19 ENCOUNTER — Ambulatory Visit: Payer: 59 | Attending: Student

## 2023-04-19 DIAGNOSIS — R29898 Other symptoms and signs involving the musculoskeletal system: Secondary | ICD-10-CM | POA: Diagnosis present

## 2023-04-19 DIAGNOSIS — R2681 Unsteadiness on feet: Secondary | ICD-10-CM

## 2023-04-19 DIAGNOSIS — M5459 Other low back pain: Secondary | ICD-10-CM | POA: Diagnosis present

## 2023-04-19 DIAGNOSIS — M5416 Radiculopathy, lumbar region: Secondary | ICD-10-CM | POA: Diagnosis present

## 2023-04-19 NOTE — Therapy (Signed)
 OUTPATIENT PHYSICAL THERAPY TREATMENT   Patient Name: PECOLA HAXTON MRN: [id number] DOB:November 05, 1960, 63 y.o., female Today's Date: 04/19/2023  END OF SESSION:  PT End of Session - 04/19/23 1231     Visit Number 8    Date for PT Re-Evaluation 05/21/23    Authorization Type [employer]    Progress Note Due on Visit 10    PT Start Time 1149    PT Stop Time 1232    PT Time Calculation (min) 43 min    Activity Tolerance Patient tolerated treatment well    Behavior During Therapy WFL for tasks assessed/performed                  Past Medical History:  Diagnosis Date   Atrial fibrillation    a. admx with AFib with RVR and acute systolic CHF 07/2013; TEE with LAA clot and no DCCV done;     Bilateral bunions 12/27/2020   Capsulitis of metatarsophalangeal (MTP) joint of right foot 01/31/2021   Chronic systolic heart failure    Coronary artery disease    a.  LHC (07/17/13):  LM 20%, ostial CFX 20-30%   Degenerative disc disease, lumbar 10/30/2017   Degenerative scoliosis 10/30/2017   Essential hypertension 09/10/2020   Fatigue 04/13/2014   Generalized anxiety disorder 09/22/2019   History of asthma 07/21/2013   History of cervical dysplasia 12/22/2021   History of kidney stones 2013   History of lumbar spinal fusion 01/16/2019   Hypercholesterolemia 09/22/2019   Hyperlipemia    Hypothyroidism    past hx   Leg weakness, bilateral 11/28/2017   Long term (current) use of anticoagulants 10/30/2017   Loss of bladder control 11/28/2017   Lumbar radiculopathy 11/28/2017   Major depressive disorder 09/22/2019   Moderate mitral regurgitation 07/17/2013   NICM (nonischemic cardiomyopathy)    a. Echo (07/17/13):  EF 25-30%, EF subsequently normalized with sinus rhythm (tachycardia mediated CM)   Osteopenia    Overweight 07/18/2019   Sinus bradycardia 08/28/2013   Spondylolisthesis at L4-L5 level 10/30/2017   Past Surgical History:  Procedure Laterality Date   ABDOMINAL HYSTERECTOMY  ~  2001   ABLATION  10/28/13   PVI by Dr Johney Frame   APPENDECTOMY  1978   ATRIAL FIBRILLATION ABLATION N/A 10/28/2013   Procedure: ATRIAL FIBRILLATION ABLATION;  Surgeon: Gardiner Rhyme, MD;  Location: [facility] CATH LAB;  Service: Cardiovascular;  Laterality: N/A;   ATRIAL FIBRILLATION ABLATION N/A 11/23/2020   Procedure: ATRIAL FIBRILLATION ABLATION;  Surgeon: Hillis Range, MD;  Location: [facility] INVASIVE CV LAB;  Service: Cardiovascular;  Laterality: N/A;   BACK SURGERY Bilateral 2019   Fusion   CARDIAC CATHETERIZATION  07/2013   CARDIOVERSION N/A 08/18/2013   Procedure: CARDIOVERSION;  Surgeon: Donato Schultz, MD;  Location: [facility] ENDOSCOPY;  Service: Cardiovascular;  Laterality: N/A;   CARDIOVERSION N/A 08/29/2013   Procedure: CARDIOVERSION;  Surgeon: Pricilla Riffle, MD;  Location: [facility] OR;  Service: Cardiovascular;  Laterality: N/A;   ELECTROPHYSIOLOGIC STUDY N/A 11/05/2014   Procedure: Atrial Fibrillation Ablation;  Surgeon: Hillis Range, MD;  Location: [facility] INVASIVE CV LAB;  Service: Cardiovascular;  Laterality: N/A;   EYE SURGERY Bilateral 1999   Corrective laser surgery   implantable loop recorder removal  10/02/2018   MDT Reveal LINQ removed in office by Dr Johney Frame for EOL battery   IR SACROPLASTY BILATERAL  02/06/2019   LEFT HEART CATHETERIZATION WITH CORONARY ANGIOGRAM N/A 07/17/2013   Procedure: LEFT HEART CATHETERIZATION WITH CORONARY ANGIOGRAM;  Surgeon: Micheline Chapman, MD;  Location: [facility] CATH LAB;  Service: Cardiovascular;  Laterality: N/A;   LOOP RECORDER IMPLANT N/A 06/05/2014   Procedure: LOOP RECORDER IMPLANT;  Surgeon: Hillis Range, MD;  Location: [facility] CATH LAB;  Service: Cardiovascular;  Laterality: N/A;   TEE WITHOUT CARDIOVERSION N/A 07/18/2013   Procedure: TRANSESOPHAGEAL ECHOCARDIOGRAM (TEE);  Surgeon: Lewayne Bunting, MD;  Location: [facility] ENDOSCOPY;  Service: Cardiovascular;  Laterality: N/A;   TEE WITHOUT CARDIOVERSION N/A 08/18/2013   Procedure: TRANSESOPHAGEAL ECHOCARDIOGRAM (TEE);  Surgeon: Donato Schultz, MD;  Location: [facility] ENDOSCOPY;  Service: Cardiovascular;  Laterality: N/A;   TEE WITHOUT CARDIOVERSION N/A 10/27/2013   Procedure: TRANSESOPHAGEAL ECHOCARDIOGRAM (TEE);  Surgeon: Pricilla Riffle, MD;  Location: [facility] ENDOSCOPY;  Service: Cardiovascular;  Laterality: N/A;   TEE WITHOUT CARDIOVERSION N/A 11/04/2014   Procedure: TRANSESOPHAGEAL ECHOCARDIOGRAM (TEE);  Surgeon: Chrystie Nose, MD;  Location: [facility] ENDOSCOPY;  Service: Cardiovascular;  Laterality: N/A;   Patient Active Problem List   Diagnosis Date Noted   Scoliosis, or kyphoscoliosis, idiopathic 11/09/2022   Osteoporosis 11/09/2022   Primary osteoarthritis of right hip 01/31/2022   Greater trochanteric bursitis of right hip 01/31/2022   History of cervical dysplasia    Capsulitis of metatarsophalangeal (MTP) joint of right foot 01/31/2021   Bilateral bunions 12/27/2020   Essential hypertension 09/10/2020   Generalized anxiety disorder 09/22/2019   Hypercholesterolemia 09/22/2019   Major depressive disorder 09/22/2019   Overweight 07/18/2019   Fracture of sacrum (HCC) 01/30/2019   History of lumbar spinal fusion 01/16/2019   Leg weakness, bilateral 11/28/2017   Loss of bladder control 11/28/2017   Lumbar radiculopathy 11/28/2017   Spinal stenosis at L4-L5 level 11/28/2017   Degenerative disc disease, lumbar 10/30/2017   Degenerative scoliosis 10/30/2017   Long term (current) use of anticoagulants 10/30/2017   NICM (nonischemic cardiomyopathy) 10/30/2017   Spondylolisthesis at L4-L5 level 10/30/2017   Fatigue 04/13/2014   Sinus bradycardia 08/28/2013   Coronary artery disease    Chronic systolic heart failure    Atrial fibrillation    History of asthma 07/21/2013   Moderate mitral regurgitation 07/17/2013    PCP: [name], MD   REFERRING PROVIDER: Sherryl Manges*   REFERRING DIAG:  [phone] (ICD-10-CM) - Spondylolisthesis of lumbar region  [phone] (ICD-10-CM) - Status post lumbar spine operation     Rationale for Evaluation and Treatment: Rehabilitation  THERAPY DIAG:  Other low back pain  Unsteadiness on feet  Left lumbar radiculopathy  Unsteady gait when walking  Weakness of both hips  ONSET DATE: Jan 15, 2023 Fusion L5/S1  SUBJECTIVE:  SUBJECTIVE STATEMENT: Doing good today.  PERTINENT HISTORY:  01/15/23 Fusion L5/S1 with hardware removal.  Previous back surgery (L2/3, 3/4, 4/5 fusion); osteoporosis, CAD, chronic systolic heart failure, Afib, history bil foot fractures.  Sacroplasty.   PAIN:  Are you having pain? Yes: NPRS scale: 1/10 Pain location: sacrum Pain description: dull ache Aggravating factors: sitting too long, sitting on hard surfaces, sitting without back support  Relieving factors: cushions, tylenol  PRECAUTIONS: Fall  RED FLAGS: None   WEIGHT BEARING RESTRICTIONS: No  FALLS:  Has patient fallen in last 6 months? No but feels very unsteady.    LIVING ENVIRONMENT: Lives with: lives with their spouse Lives in: House/apartment Stairs: No Has following equipment at home: Dan Humphreys - 2 wheeled  OCCUPATION: disabled  PLOF: Independent  PATIENT GOALS: regain my balance, hopefully improve pain  NEXT MD VISIT: March 2025  OBJECTIVE:   DIAGNOSTIC FINDINGS:  12/07/2022 CT Lumbar Spine (PRE-OP) IMPRESSION: 1. Previous posterior decompression and discectomy at L2-3. Right-sided L3 pedicle screw does breach the superior endplate and enter the lateral disc space. No evidence of motion of either of the L3 screws. There is mild lucency and surrounding sclerosis at the left L2 screw. Possibility of mild screw motion does exist, but this does not appear pronounced. 2. Previous posterior decompression, diskectomy and fusion at L3-4 and L4-5. No evidence of motion or  hardware failure. 3. L5-S1: Bulging of the disc. Bilateral facet arthropathy worse on the left than the right. Left foraminal stenosis primarily due to osteophytic encroachment from facet arthropathy in combination with endplate osteophytes and bulging disc that could affect the exiting left L5 nerve.  PATIENT SURVEYS:  Modified Oswestry 19/50   COGNITION: Overall cognitive status: Within functional limits for tasks assessed     SENSATION: Diminished to light touch L S1 dermatome.  Reports tingling in L foot still after surgery.   MUSCLE LENGTH: NT  POSTURE:  decreased cervical lordosis  PALPATION: NA  LUMBAR ROM:   AROM eval  Flexion   Extension   Right lateral flexion   Left lateral flexion   Right rotation   Left rotation    (Blank rows = not tested)  LOWER EXTREMITY ROM:      NT today  LOWER EXTREMITY MMT:    MMT Right* eval Left* eval  Hip flexion 4+ 4  Hip extension    Hip abduction 5 5  Hip adduction 5 5  Knee flexion 5 5  Knee extension 5 5  Ankle dorsiflexion 5 5  Ankle plantarflexion 5 WB 5 WB   (Blank rows = not tested) * tested in sitting  FUNCTIONAL TESTS:  5 times sit to stand: 19.5 sec with bil UE assist (needed for eccentric control due to sacral pain) Functional gait assessment: 12/30 MCTSIB: Condition 1: Avg of 3 trials: 30 sec, Condition 2: Avg of 3 trials: 30 sec, Condition 3: Avg of 3 trials: 30 sec, Condition 4: Avg of 3 trials: 30 sec, and Total Score: 120/120   GAIT: Distance walked: 200' Assistive device utilized: None Level of assistance: Complete Independence Comments: occasionally unsteady, visually slow gait speed.   TODAY'S TREATMENT:  DATE: 04/19/23  [facility] PT Assessment - 04/19/23 0001       Functional Gait  Assessment   Gait Level Surface Walks 20 ft in less than 7 sec but greater than 5.5 sec,  uses assistive device, slower speed, mild gait deviations, or deviates 6-10 in outside of the 12 in walkway width.    Change in Gait Speed Able to change speed, demonstrates mild gait deviations, deviates 6-10 in outside of the 12 in walkway width, or no gait deviations, unable to achieve a major change in velocity, or uses a change in velocity, or uses an assistive device.    Gait with Horizontal Head Turns Performs head turns with moderate changes in gait velocity, slows down, deviates 10-15 in outside 12 in walkway width but recovers, can continue to walk.    Gait with Vertical Head Turns Performs task with slight change in gait velocity (eg, minor disruption to smooth gait path), deviates 6 - 10 in outside 12 in walkway width or uses assistive device    Gait and Pivot Turn Pivot turns safely in greater than 3 sec and stops with no loss of balance, or pivot turns safely within 3 sec and stops with mild imbalance, requires small steps to catch balance.    Step Over Obstacle Is able to step over 2 stacked shoe boxes taped together (9 in total height) without changing gait speed. No evidence of imbalance.    Gait with Narrow Base of Support Ambulates 4-7 steps.    Gait with Eyes Closed Walks 20 ft, slow speed, abnormal gait pattern, evidence for imbalance, deviates 10-15 in outside 12 in walkway width. Requires more than 9 sec to ambulate 20 ft.    Ambulating Backwards Walks 20 ft, slow speed, abnormal gait pattern, evidence for imbalance, deviates 10-15 in outside 12 in walkway width.    Steps Alternating feet, must use rail.    Total Score 17           Fwd step and reach Modified SLS with trunk rotation bil Seated series of R/L arm raise, then R/L trunk rotation 5x 5 reps each for core activation Seated B shld ER and hip ABD holds 5x5" for core activation   04/10/23 Bike L2 x 5 min Neuromuscular Reeducation: for balance, coordination and proprioception With mirror for feedback and SBA for  safety: Toe taps on stool alternating Standing with one foot on stool  -Head turns   -head nods Forward steps over 1/2 foam roller x 10 bil  Side steps over 1/2 foam roller x 10 bil    04/05/23 Therapeutic Exercise: to improve strength and mobility.  Demo, verbal and tactile cues throughout for technique. Nustep L5 x 6 min Knee extension 10lb BLE x 20 Knee flexion 20lb BLE x 20  Neuromuscular Reeducation: for balance& coordination - close SBA to CGA for safety Star excursion pattern - forward, side, back, cross x 5 each side, mirror for feedback- LOB a couple of times Step ups no UE support x 10  Toe taps 8' x 10 bil Standing bird dog x 10 bil Braiding for 5 ft- cues for step length  04/03/23 Therapeutic Exercise: to improve strength and mobility.  Demo, verbal and tactile cues throughout for technique. Nustep L5 x 5 min Gastroc stretch 3 x 30 sec - towel roll under foot Soleus stretch 3 x 30 sec - towel roll under foot Neuromuscular Reeducation: for balance& coordination - close SBA to CGA for safety Star excursion pattern - forward,  side, back, cross x 5 each side, mirror for feedback Stepping forward and reaching for stepping strategy x 10 each side Manual Therapy: to decrease muscle spasm and pain and improve mobility STM/TPR to L calf, skilled palpation and monitoring during dry needling.  Trigger Point Dry Needling  Initial Treatment: Pt instructed on Dry Needling rational, procedures, and possible side effects. Pt instructed to expect mild to moderate muscle soreness later in the day and/or into the next day.  Pt instructed in methods to reduce muscle soreness. Pt instructed to continue prescribed HEP. Patient was educated on signs and symptoms of infection and other risk factors and advised to seek medical attention should they occur.  Patient verbalized understanding of these instructions and education.   Patient Verbal Consent Given: Yes Education Handout Provided:  Yes Muscles Treated: L gastroc/soleus med & lateral Electrical Stimulation Performed: No Treatment Response/Outcome: Twitch Response Elicited and Palpable Increase in Muscle Length   03/29/23 Therapeutic Exercise: to improve strength and mobility.  Demo, verbal and tactile cues throughout for technique.  Bike L2x15min Bridge + RTB 2x10 NEUROMUSCULAR RE-EDUCATION: To improve posture, balance, and kinesthesia. Supine ab sets with orange pball 15x3" Bird dog x 10- starting with feet on table, cues for coordinated movement Supine sequential LE march and lower for core activation x 10 Bridge straight leg orange pball x 10  Manual Therapy: to decrease muscle spasm, pain and improve mobility.  STM to L soleus/gastroc complex    PATIENT EDUCATION:  Education details: HEP update Person educated: Patient Education method: Explanation, Demonstration, Verbal cues, and Handouts Education comprehension: verbalized understanding  HOME EXERCISE PROGRAM: Access Code: [email or web address] URL: [email or web address] Date: 04/10/2023 Prepared by: Harrie Foreman  Exercises - Supine Posterior Pelvic Tilt  - 1 x daily - 7 x weekly - 2 sets - 10 reps - Supine Bridge with Resistance Band  - 1 x daily - 7 x weekly - 3 sets - 10 reps - Clamshell with Resistance  - 1 x daily - 7 x weekly - 3 sets - 10 reps - Standing Gastroc Stretch on Foam 1/2 Roll  - 1 x daily - 7 x weekly - 1 sets - 3 reps - 30 hold - Standing Gastroc Stretch  - 1 x daily - 7 x weekly - 1 sets - 3 reps - 30 sec hold - Standing Tandem Balance with Counter Support  - 1 x daily - 7 x weekly - 1 sets - 3 reps - 30 sec hold - Standing Single Leg Stance with Counter Support  - 1 x daily - 7 x weekly - 1 sets - 3 reps - 30 sec  hold  ASSESSMENT:  CLINICAL IMPRESSION: Pt scored 17/30 on FGA, which shows 5 points of improvement. Continued balance work, incorporating core activation to improve stability. CGA required as she is unsteady  at times. She continues to progress well. Sharlot Gowda continues to demonstrate potential for improvement and would benefit from continued skilled therapy to address impairments.       OBJECTIVE IMPAIRMENTS: Abnormal gait, decreased activity tolerance, decreased balance, decreased endurance, difficulty walking, decreased strength, impaired perceived functional ability, and pain.   ACTIVITY LIMITATIONS: carrying, lifting, bending, standing, squatting, sleeping, transfers, and locomotion level  PARTICIPATION LIMITATIONS: meal prep, cleaning, laundry, shopping, community activity, and yard work  PERSONAL FACTORS: Behavior pattern, Fitness, Past/current experiences, and 3+ comorbidities: Previous back surgery (L2/3, 3/4, 4/5 fusion); osteoporosis, CAD, chronic systolic heart failure, Afib, history bil foot fractures  are also affecting patient's  functional outcome.   REHAB POTENTIAL: Good  CLINICAL DECISION MAKING: Evolving/moderate complexity  EVALUATION COMPLEXITY: Moderate   GOALS: Goals reviewed with patient? Yes  SHORT TERM GOALS: Target date: 03/26/2023   Patient will be independent with initial HEP.  Baseline: TBD Goal status: MET 04/03/23   LONG TERM GOALS: Target date: 05/21/2023   Patient will be independent with advanced/ongoing HEP to improve outcomes and carryover.  Baseline:  Goal status: IN PROGRESS 04/03/23 updated  2.  Patient will report 75% improvement in low back/sacral pain to improve QOL.  Baseline: constant dull ache Goal status: IN PROGRESS 04/03/23 - sacral pain has improved significantly to 1/10  3.  Patient will demonstrate improved functional strength as demonstrated by 5x STS < 15 sec to decrease fall risk. Baseline: 19.5 sec with bil UE assist Goal status: IN PROGRESS  4.  Patient will report at least 6 points improvement on modified Oswestry to demonstrate improved functional ability.  Baseline: 19/50 Goal status: IN PROGRESS   5.  Patient will  score at least 19/30 on FGA to decrease risk of falls with injury. Baseline: 12/30 Goal status: IN PROGRESS- 04/19/23  PLAN:  PT FREQUENCY: 2x/week  PT DURATION: 10 weeks  PLANNED INTERVENTIONS: 97110-Therapeutic exercises, 97530- Therapeutic activity, 97112- Neuromuscular re-education, 97535- Self Care, 16109- Manual therapy, [phone]- Gait training, [id number]- Electrical stimulation (unattended), [phone]- Electrical stimulation (manual), [id number]- Ultrasound, Patient/Family education, Balance training, Stair training, Taping, Dry Needling, Joint mobilization, Joint manipulation, Spinal mobilization, Vestibular training, Cryotherapy, and Moist heat.  PLAN FOR NEXT SESSION: core strengthening, hip strengthening, balance training,   Darleene Cleaver, PTA 04/19/2023, 12:32 PM

## 2023-04-23 ENCOUNTER — Encounter: Payer: Self-pay | Admitting: Behavioral Health

## 2023-04-23 ENCOUNTER — Ambulatory Visit (INDEPENDENT_AMBULATORY_CARE_PROVIDER_SITE_OTHER): Payer: 59 | Admitting: Behavioral Health

## 2023-04-23 DIAGNOSIS — F4321 Adjustment disorder with depressed mood: Secondary | ICD-10-CM
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
## 2023-04-24 ENCOUNTER — Encounter: Payer: Self-pay | Admitting: Physical Therapy

## 2023-04-24 ENCOUNTER — Ambulatory Visit: Payer: 59 | Admitting: Physical Therapy

## 2023-04-24 DIAGNOSIS — M5459 Other low back pain: Secondary | ICD-10-CM

## 2023-04-24 DIAGNOSIS — R2681 Unsteadiness on feet: Secondary | ICD-10-CM

## 2023-04-24 NOTE — Therapy (Signed)
 OUTPATIENT PHYSICAL THERAPY TREATMENT   Patient Name: Kiara [name] MRN: [id number] DOB:[birth year], 63 y.o., female Today's Date: 04/24/2023  END OF SESSION:  PT End of Session - 04/24/23 1150     Visit Number 9    Date for PT Re-Evaluation 05/21/23    Authorization Type [employer]    Progress Note Due on Visit 10    PT Start Time 1155    PT Stop Time 1233    PT Time Calculation (min) 38 min    Activity Tolerance Patient tolerated treatment well    Behavior During Therapy WFL for tasks assessed/performed                  Past Medical History:  Diagnosis Date   Atrial fibrillation    a. admx with AFib with RVR and acute systolic CHF 07/2013; TEE with LAA clot and no DCCV done;     Bilateral bunions 12/27/2020   Capsulitis of metatarsophalangeal (MTP) joint of right foot 01/31/2021   Chronic systolic heart failure    Coronary artery disease    a.  LHC (07/17/13):  LM 20%, ostial CFX 20-30%   Degenerative disc disease, lumbar 10/30/2017   Degenerative scoliosis 10/30/2017   Essential hypertension 09/10/2020   Fatigue 04/13/2014   Generalized anxiety disorder 09/22/2019   History of asthma 07/21/2013   History of cervical dysplasia 12/22/2021   History of kidney stones 2013   History of lumbar spinal fusion 01/16/2019   Hypercholesterolemia 09/22/2019   Hyperlipemia    Hypothyroidism    past hx   Leg weakness, bilateral 11/28/2017   Long term (current) use of anticoagulants 10/30/2017   Loss of bladder control 11/28/2017   Lumbar radiculopathy 11/28/2017   Major depressive disorder 09/22/2019   Moderate mitral regurgitation 07/17/2013   NICM (nonischemic cardiomyopathy)    a. Echo (07/17/13):  EF 25-30%, EF subsequently normalized with sinus rhythm (tachycardia mediated CM)   Osteopenia    Overweight 07/18/2019   Sinus bradycardia 08/28/2013   Spondylolisthesis at L4-L5 level 10/30/2017   Past Surgical History:  Procedure Laterality Date   ABDOMINAL HYSTERECTOMY   ~ 2001   ABLATION  10/28/13   PVI by Dr Johney Frame   APPENDECTOMY  1978   ATRIAL FIBRILLATION ABLATION N/A 10/28/2013   Procedure: ATRIAL FIBRILLATION ABLATION;  Surgeon: Gardiner Rhyme, MD;  Location: [facility] CATH LAB;  Service: Cardiovascular;  Laterality: N/A;   ATRIAL FIBRILLATION ABLATION N/A 11/23/2020   Procedure: ATRIAL FIBRILLATION ABLATION;  Surgeon: Hillis Range, MD;  Location: [facility] INVASIVE CV LAB;  Service: Cardiovascular;  Laterality: N/A;   BACK SURGERY Bilateral 2019   Fusion   CARDIAC CATHETERIZATION  07/2013   CARDIOVERSION N/A 08/18/2013   Procedure: CARDIOVERSION;  Surgeon: Donato Schultz, MD;  Location: [facility] ENDOSCOPY;  Service: Cardiovascular;  Laterality: N/A;   CARDIOVERSION N/A 08/29/2013   Procedure: CARDIOVERSION;  Surgeon: Pricilla Riffle, MD;  Location: [facility] OR;  Service: Cardiovascular;  Laterality: N/A;   ELECTROPHYSIOLOGIC STUDY N/A 11/05/2014   Procedure: Atrial Fibrillation Ablation;  Surgeon: Hillis Range, MD;  Location: [facility] INVASIVE CV LAB;  Service: Cardiovascular;  Laterality: N/A;   EYE SURGERY Bilateral 1999   Corrective laser surgery   implantable loop recorder removal  10/02/2018   MDT Reveal LINQ removed in office by Dr Johney Frame for EOL battery   IR SACROPLASTY BILATERAL  02/06/2019   LEFT HEART CATHETERIZATION WITH CORONARY ANGIOGRAM N/A 07/17/2013   Procedure: LEFT HEART CATHETERIZATION WITH CORONARY ANGIOGRAM;  Surgeon: Micheline Chapman, MD;  Location: [facility] CATH LAB;  Service: Cardiovascular;  Laterality: N/A;   LOOP RECORDER IMPLANT N/A 06/05/2014   Procedure: LOOP RECORDER IMPLANT;  Surgeon: Hillis Range, MD;  Location: [facility] CATH LAB;  Service: Cardiovascular;  Laterality: N/A;   TEE WITHOUT CARDIOVERSION N/A 07/18/2013   Procedure: TRANSESOPHAGEAL ECHOCARDIOGRAM (TEE);  Surgeon: Lewayne Bunting, MD;  Location: [facility] ENDOSCOPY;  Service: Cardiovascular;  Laterality: N/A;   TEE WITHOUT CARDIOVERSION N/A 08/18/2013   Procedure: TRANSESOPHAGEAL ECHOCARDIOGRAM (TEE);  Surgeon: Donato Schultz, MD;  Location: [facility] ENDOSCOPY;  Service: Cardiovascular;  Laterality: N/A;   TEE WITHOUT CARDIOVERSION N/A 10/27/2013   Procedure: TRANSESOPHAGEAL ECHOCARDIOGRAM (TEE);  Surgeon: Pricilla Riffle, MD;  Location: [facility] ENDOSCOPY;  Service: Cardiovascular;  Laterality: N/A;   TEE WITHOUT CARDIOVERSION N/A 11/04/2014   Procedure: TRANSESOPHAGEAL ECHOCARDIOGRAM (TEE);  Surgeon: Chrystie Nose, MD;  Location: [facility] ENDOSCOPY;  Service: Cardiovascular;  Laterality: N/A;   Patient Active Problem List   Diagnosis Date Noted   Scoliosis, or kyphoscoliosis, idiopathic 11/09/2022   Osteoporosis 11/09/2022   Primary osteoarthritis of right hip 01/31/2022   Greater trochanteric bursitis of right hip 01/31/2022   History of cervical dysplasia    Capsulitis of metatarsophalangeal (MTP) joint of right foot 01/31/2021   Bilateral bunions 12/27/2020   Essential hypertension 09/10/2020   Generalized anxiety disorder 09/22/2019   Hypercholesterolemia 09/22/2019   Major depressive disorder 09/22/2019   Overweight 07/18/2019   Fracture of sacrum (HCC) 01/30/2019   History of lumbar spinal fusion 01/16/2019   Leg weakness, bilateral 11/28/2017   Loss of bladder control 11/28/2017   Lumbar radiculopathy 11/28/2017   Spinal stenosis at L4-L5 level 11/28/2017   Degenerative disc disease, lumbar 10/30/2017   Degenerative scoliosis 10/30/2017   Long term (current) use of anticoagulants 10/30/2017   NICM (nonischemic cardiomyopathy) 10/30/2017   Spondylolisthesis at L4-L5 level 10/30/2017   Fatigue 04/13/2014   Sinus bradycardia 08/28/2013   Coronary artery disease    Chronic systolic heart failure    Atrial fibrillation    History of asthma 07/21/2013   Moderate mitral regurgitation 07/17/2013    PCP: [name], MD   REFERRING PROVIDER: Sherryl Manges*   REFERRING DIAG:  [phone] (ICD-10-CM) - Spondylolisthesis of lumbar region  [phone] (ICD-10-CM) - Status post lumbar spine operation     Rationale for Evaluation and Treatment: Rehabilitation  THERAPY DIAG:  Other low back pain  Unsteadiness on feet  ONSET DATE: Jan 15, 2023 Fusion L5/S1  SUBJECTIVE:  SUBJECTIVE STATEMENT: The pain in her calf finally quit.    PERTINENT HISTORY:  01/15/23 Fusion L5/S1 with hardware removal.  Previous back surgery (L2/3, 3/4, 4/5 fusion); osteoporosis, CAD, chronic systolic heart failure, Afib, history bil foot fractures.  Sacroplasty.   PAIN:  Are you having pain? Yes: NPRS scale: 1/10 Pain location: sacrum Pain description: dull ache Aggravating factors: sitting too long, sitting on hard surfaces, sitting without back support  Relieving factors: cushions, tylenol  PRECAUTIONS: Fall  RED FLAGS: None   WEIGHT BEARING RESTRICTIONS: No  FALLS:  Has patient fallen in last 6 months? No but feels very unsteady.    LIVING ENVIRONMENT: Lives with: lives with their spouse Lives in: House/apartment Stairs: No Has following equipment at home: Dan Humphreys - 2 wheeled  OCCUPATION: disabled  PLOF: Independent  PATIENT GOALS: regain my balance, hopefully improve pain  NEXT MD VISIT: March 2025  OBJECTIVE:   DIAGNOSTIC FINDINGS:  12/07/2022 CT Lumbar Spine (PRE-OP) IMPRESSION: 1. Previous posterior decompression and discectomy at L2-3. Right-sided L3 pedicle screw does breach the superior endplate and enter the lateral disc space. No evidence of motion of either of the L3 screws. There is mild lucency and surrounding sclerosis at the left L2 screw. Possibility of mild screw motion does exist, but this does not appear pronounced. 2. Previous posterior decompression, diskectomy and fusion at L3-4 and L4-5. No evidence of motion or hardware failure. 3. L5-S1: Bulging of the disc. Bilateral facet  arthropathy worse on the left than the right. Left foraminal stenosis primarily due to osteophytic encroachment from facet arthropathy in combination with endplate osteophytes and bulging disc that could affect the exiting left L5 nerve.  PATIENT SURVEYS:  Modified Oswestry 19/50   COGNITION: Overall cognitive status: Within functional limits for tasks assessed     SENSATION: Diminished to light touch L S1 dermatome.  Reports tingling in L foot still after surgery.   MUSCLE LENGTH: NT  POSTURE:  decreased cervical lordosis  PALPATION: NA  LUMBAR ROM:   AROM eval  Flexion   Extension   Right lateral flexion   Left lateral flexion   Right rotation   Left rotation    (Blank rows = not tested)  LOWER EXTREMITY ROM:      NT today  LOWER EXTREMITY MMT:    MMT Right* eval Left* eval  Hip flexion 4+ 4  Hip extension    Hip abduction 5 5  Hip adduction 5 5  Knee flexion 5 5  Knee extension 5 5  Ankle dorsiflexion 5 5  Ankle plantarflexion 5 WB 5 WB   (Blank rows = not tested) * tested in sitting  FUNCTIONAL TESTS:  5 times sit to stand: 19.5 sec with bil UE assist (needed for eccentric control due to sacral pain) Functional gait assessment: 12/30 MCTSIB: Condition 1: Avg of 3 trials: 30 sec, Condition 2: Avg of 3 trials: 30 sec, Condition 3: Avg of 3 trials: 30 sec, Condition 4: Avg of 3 trials: 30 sec, and Total Score: 120/120   GAIT: Distance walked: 200' Assistive device utilized: None Level of assistance: Complete Independence Comments: occasionally unsteady, visually slow gait speed.   TODAY'S TREATMENT:  DATE:  04/24/2023  Therapeutic Exercise: to improve strength and mobility.  Demo, verbal and tactile cues throughout for technique. Bike x 5 min Forward RDLS x 10 each side Neuromuscular Reeducation: for balance, coordination and  proprioception, In corner for safety, On level surface Eyes open feet together x 30 sec  Eyes open feet together with head nods x 10 Eyes open feet together with head turns x 10 Eyes closed feet together x 30 sec Eyes closed feet together with head nods x 10 Eyes closed feet together with head turns x 10  In corner for safety, On airex Eyes open feet apart x 30 sec  Eyes open feet apart with head nods x 10 Eyes open feet apart with head turns x 10 Eyes closed feet apart x 30 sec Eyes closed feet apart with head nods x 10 Eyes closed feet apart with head turns x 10  With CGA: Star excursion pattern: Forward, side, back, cross step x 10 each side    04/19/23  [facility] PT Assessment - 04/19/23 0001       Functional Gait  Assessment   Gait Level Surface Walks 20 ft in less than 7 sec but greater than 5.5 sec, uses assistive device, slower speed, mild gait deviations, or deviates 6-10 in outside of the 12 in walkway width.    Change in Gait Speed Able to change speed, demonstrates mild gait deviations, deviates 6-10 in outside of the 12 in walkway width, or no gait deviations, unable to achieve a major change in velocity, or uses a change in velocity, or uses an assistive device.    Gait with Horizontal Head Turns Performs head turns with moderate changes in gait velocity, slows down, deviates 10-15 in outside 12 in walkway width but recovers, can continue to walk.    Gait with Vertical Head Turns Performs task with slight change in gait velocity (eg, minor disruption to smooth gait path), deviates 6 - 10 in outside 12 in walkway width or uses assistive device    Gait and Pivot Turn Pivot turns safely in greater than 3 sec and stops with no loss of balance, or pivot turns safely within 3 sec and stops with mild imbalance, requires small steps to catch balance.    Step Over Obstacle Is able to step over 2 stacked shoe boxes taped together (9 in total height) without changing gait speed. No evidence  of imbalance.    Gait with Narrow Base of Support Ambulates 4-7 steps.    Gait with Eyes Closed Walks 20 ft, slow speed, abnormal gait pattern, evidence for imbalance, deviates 10-15 in outside 12 in walkway width. Requires more than 9 sec to ambulate 20 ft.    Ambulating Backwards Walks 20 ft, slow speed, abnormal gait pattern, evidence for imbalance, deviates 10-15 in outside 12 in walkway width.    Steps Alternating feet, must use rail.    Total Score 17           Fwd step and reach Modified SLS with trunk rotation bil Seated series of R/L arm raise, then R/L trunk rotation 5x 5 reps each for core activation Seated B shld ER and hip ABD holds 5x5" for core activation   04/10/23 Bike L2 x 5 min Neuromuscular Reeducation: for balance, coordination and proprioception With mirror for feedback and SBA for safety: Toe taps on stool alternating Standing with one foot on stool  -Head turns   -head nods Forward steps over 1/2 foam roller x 10 bil  Side steps over 1/2 foam roller x 10 bil    04/05/23 Therapeutic Exercise: to improve strength and mobility.  Demo, verbal and tactile cues throughout for technique. Nustep L5 x 6 min Knee extension 10lb BLE x 20 Knee flexion 20lb BLE x 20  Neuromuscular Reeducation: for balance& coordination - close SBA to CGA for safety Star excursion pattern - forward, side, back, cross x 5 each side, mirror for feedback- LOB a couple of times Step ups no UE support x 10  Toe taps 8' x 10 bil Standing bird dog x 10 bil Braiding for 5 ft- cues for step length      PATIENT EDUCATION:  Education details: HEP update Person educated: Patient Education method: Explanation, Demonstration, Verbal cues, and Handouts Education comprehension: verbalized understanding  HOME EXERCISE PROGRAM: Access Code: [email or web address] URL: [email or web address] Date: 04/10/2023 Prepared by: Harrie Foreman  Exercises - Supine Posterior Pelvic Tilt  - 1 x  daily - 7 x weekly - 2 sets - 10 reps - Supine Bridge with Resistance Band  - 1 x daily - 7 x weekly - 3 sets - 10 reps - Clamshell with Resistance  - 1 x daily - 7 x weekly - 3 sets - 10 reps - Standing Gastroc Stretch on Foam 1/2 Roll  - 1 x daily - 7 x weekly - 1 sets - 3 reps - 30 hold - Standing Gastroc Stretch  - 1 x daily - 7 x weekly - 1 sets - 3 reps - 30 sec hold - Standing Tandem Balance with Counter Support  - 1 x daily - 7 x weekly - 1 sets - 3 reps - 30 sec hold - Standing Single Leg Stance with Counter Support  - 1 x daily - 7 x weekly - 1 sets - 3 reps - 30 sec  hold  ASSESSMENT:  CLINICAL IMPRESSION: Luisa O Zieske reports calf pain has resolved and tailbone pain is improving significantly.  Continued to focus on balance exercises today, did well with head turns on solid surface, challenged on airex with increased sway especially with eyes closed but no LOB.  Sharlot Gowda continues to demonstrate potential for improvement and would benefit from continued skilled therapy to address impairments.       OBJECTIVE IMPAIRMENTS: Abnormal gait, decreased activity tolerance, decreased balance, decreased endurance, difficulty walking, decreased strength, impaired perceived functional ability, and pain.   ACTIVITY LIMITATIONS: carrying, lifting, bending, standing, squatting, sleeping, transfers, and locomotion level  PARTICIPATION LIMITATIONS: meal prep, cleaning, laundry, shopping, community activity, and yard work  PERSONAL FACTORS: Behavior pattern, Fitness, Past/current experiences, and 3+ comorbidities: Previous back surgery (L2/3, 3/4, 4/5 fusion); osteoporosis, CAD, chronic systolic heart failure, Afib, history bil foot fractures  are also affecting patient's functional outcome.   REHAB POTENTIAL: Good  CLINICAL DECISION MAKING: Evolving/moderate complexity  EVALUATION COMPLEXITY: Moderate   GOALS: Goals reviewed with patient? Yes  SHORT TERM GOALS: Target date:  03/26/2023   Patient will be independent with initial HEP.  Baseline: TBD Goal status: MET 04/03/23   LONG TERM GOALS: Target date: 05/21/2023   Patient will be independent with advanced/ongoing HEP to improve outcomes and carryover.  Baseline:  Goal status: IN PROGRESS 04/03/23 updated  2.  Patient will report 75% improvement in low back/sacral pain to improve QOL.  Baseline: constant dull ache Goal status: IN PROGRESS 04/03/23 - sacral pain has improved significantly to 1/10  3.  Patient will demonstrate improved functional strength as demonstrated by 5x STS < 15  sec to decrease fall risk. Baseline: 19.5 sec with bil UE assist Goal status: IN PROGRESS  4.  Patient will report at least 6 points improvement on modified Oswestry to demonstrate improved functional ability.  Baseline: 19/50 Goal status: IN PROGRESS   5.  Patient will score at least 19/30 on FGA to decrease risk of falls with injury. Baseline: 12/30 Goal status: IN PROGRESS- 04/19/23 17/30  PLAN:  PT FREQUENCY: 2x/week  PT DURATION: 10 weeks  PLANNED INTERVENTIONS: 97110-Therapeutic exercises, 97530- Therapeutic activity, 97112- Neuromuscular re-education, 97535- Self Care, 29562- Manual therapy, 97116- Gait training, 97014- Electrical stimulation (unattended), [phone]- Electrical stimulation (manual), [id number]- Ultrasound, Patient/Family education, Balance training, Stair training, Taping, Dry Needling, Joint mobilization, Joint manipulation, Spinal mobilization, Vestibular training, Cryotherapy, and Moist heat.  PLAN FOR NEXT SESSION: core strengthening, hip strengthening, balance training,   Jena Gauss, PT 04/24/2023, 12:38 PM

## 2023-04-26 ENCOUNTER — Ambulatory Visit: Payer: 59

## 2023-04-26 ENCOUNTER — Other Ambulatory Visit: Payer: Self-pay | Admitting: Student

## 2023-04-26 DIAGNOSIS — R2681 Unsteadiness on feet: Secondary | ICD-10-CM

## 2023-04-26 DIAGNOSIS — R29898 Other symptoms and signs involving the musculoskeletal system: Secondary | ICD-10-CM

## 2023-04-26 DIAGNOSIS — M5459 Other low back pain: Secondary | ICD-10-CM

## 2023-04-26 DIAGNOSIS — M5416 Radiculopathy, lumbar region: Secondary | ICD-10-CM

## 2023-04-26 NOTE — Therapy (Signed)
 OUTPATIENT PHYSICAL THERAPY TREATMENT   Patient Name: Kiara [name] MRN: [id number] DOB:1960/08/02, 63 y.o., female Today's Date: 04/26/2023  END OF SESSION:  PT End of Session - 04/26/23 1235     Visit Number 10    Date for PT Re-Evaluation 05/21/23    Authorization Type [employer]    Progress Note Due on Visit 10    PT Start Time 1151    PT Stop Time 1230    PT Time Calculation (min) 39 min    Activity Tolerance Patient tolerated treatment well    Behavior During Therapy WFL for tasks assessed/performed                   Past Medical History:  Diagnosis Date   Atrial fibrillation    a. admx with AFib with RVR and acute systolic CHF 07/2013; TEE with LAA clot and no DCCV done;     Bilateral bunions 12/27/2020   Capsulitis of metatarsophalangeal (MTP) joint of right foot 01/31/2021   Chronic systolic heart failure    Coronary artery disease    a.  LHC (07/17/13):  LM 20%, ostial CFX 20-30%   Degenerative disc disease, lumbar 10/30/2017   Degenerative scoliosis 10/30/2017   Essential hypertension 09/10/2020   Fatigue 04/13/2014   Generalized anxiety disorder 09/22/2019   History of asthma 07/21/2013   History of cervical dysplasia 12/22/2021   History of kidney stones 2013   History of lumbar spinal fusion 01/16/2019   Hypercholesterolemia 09/22/2019   Hyperlipemia    Hypothyroidism    past hx   Leg weakness, bilateral 11/28/2017   Long term (current) use of anticoagulants 10/30/2017   Loss of bladder control 11/28/2017   Lumbar radiculopathy 11/28/2017   Major depressive disorder 09/22/2019   Moderate mitral regurgitation 07/17/2013   NICM (nonischemic cardiomyopathy)    a. Echo (07/17/13):  EF 25-30%, EF subsequently normalized with sinus rhythm (tachycardia mediated CM)   Osteopenia    Overweight 07/18/2019   Sinus bradycardia 08/28/2013   Spondylolisthesis at L4-L5 level 10/30/2017   Past Surgical History:  Procedure Laterality Date   ABDOMINAL  HYSTERECTOMY  ~ 2001   ABLATION  10/28/13   PVI by Dr Johney Frame   APPENDECTOMY  1978   ATRIAL FIBRILLATION ABLATION N/A 10/28/2013   Procedure: ATRIAL FIBRILLATION ABLATION;  Surgeon: Gardiner Rhyme, MD;  Location: [facility] CATH LAB;  Service: Cardiovascular;  Laterality: N/A;   ATRIAL FIBRILLATION ABLATION N/A 11/23/2020   Procedure: ATRIAL FIBRILLATION ABLATION;  Surgeon: Hillis Range, MD;  Location: [facility] INVASIVE CV LAB;  Service: Cardiovascular;  Laterality: N/A;   BACK SURGERY Bilateral 2019   Fusion   CARDIAC CATHETERIZATION  07/2013   CARDIOVERSION N/A 08/18/2013   Procedure: CARDIOVERSION;  Surgeon: Donato Schultz, MD;  Location: [facility] ENDOSCOPY;  Service: Cardiovascular;  Laterality: N/A;   CARDIOVERSION N/A 08/29/2013   Procedure: CARDIOVERSION;  Surgeon: Pricilla Riffle, MD;  Location: [facility] OR;  Service: Cardiovascular;  Laterality: N/A;   ELECTROPHYSIOLOGIC STUDY N/A 11/05/2014   Procedure: Atrial Fibrillation Ablation;  Surgeon: Hillis Range, MD;  Location: [facility] INVASIVE CV LAB;  Service: Cardiovascular;  Laterality: N/A;   EYE SURGERY Bilateral 1999   Corrective laser surgery   implantable loop recorder removal  10/02/2018   MDT Reveal LINQ removed in office by Dr Johney Frame for EOL battery   IR SACROPLASTY BILATERAL  02/06/2019   LEFT HEART CATHETERIZATION WITH CORONARY ANGIOGRAM N/A 07/17/2013   Procedure: LEFT HEART CATHETERIZATION WITH CORONARY ANGIOGRAM;  Surgeon: Micheline Chapman,  MD;  Location: [facility] CATH LAB;  Service: Cardiovascular;  Laterality: N/A;   LOOP RECORDER IMPLANT N/A 06/05/2014   Procedure: LOOP RECORDER IMPLANT;  Surgeon: Hillis Range, MD;  Location: [facility] CATH LAB;  Service: Cardiovascular;  Laterality: N/A;   TEE WITHOUT CARDIOVERSION N/A 07/18/2013   Procedure: TRANSESOPHAGEAL ECHOCARDIOGRAM (TEE);  Surgeon: Lewayne Bunting, MD;  Location: [facility] ENDOSCOPY;  Service: Cardiovascular;  Laterality: N/A;   TEE WITHOUT CARDIOVERSION N/A 08/18/2013   Procedure: TRANSESOPHAGEAL ECHOCARDIOGRAM (TEE);   Surgeon: Donato Schultz, MD;  Location: [facility] ENDOSCOPY;  Service: Cardiovascular;  Laterality: N/A;   TEE WITHOUT CARDIOVERSION N/A 10/27/2013   Procedure: TRANSESOPHAGEAL ECHOCARDIOGRAM (TEE);  Surgeon: Pricilla Riffle, MD;  Location: [facility] ENDOSCOPY;  Service: Cardiovascular;  Laterality: N/A;   TEE WITHOUT CARDIOVERSION N/A 11/04/2014   Procedure: TRANSESOPHAGEAL ECHOCARDIOGRAM (TEE);  Surgeon: Chrystie Nose, MD;  Location: [facility] ENDOSCOPY;  Service: Cardiovascular;  Laterality: N/A;   Patient Active Problem List   Diagnosis Date Noted   Scoliosis, or kyphoscoliosis, idiopathic 11/09/2022   Osteoporosis 11/09/2022   Primary osteoarthritis of right hip 01/31/2022   Greater trochanteric bursitis of right hip 01/31/2022   History of cervical dysplasia    Capsulitis of metatarsophalangeal (MTP) joint of right foot 01/31/2021   Bilateral bunions 12/27/2020   Essential hypertension 09/10/2020   Generalized anxiety disorder 09/22/2019   Hypercholesterolemia 09/22/2019   Major depressive disorder 09/22/2019   Overweight 07/18/2019   Fracture of sacrum (HCC) 01/30/2019   History of lumbar spinal fusion 01/16/2019   Leg weakness, bilateral 11/28/2017   Loss of bladder control 11/28/2017   Lumbar radiculopathy 11/28/2017   Spinal stenosis at L4-L5 level 11/28/2017   Degenerative disc disease, lumbar 10/30/2017   Degenerative scoliosis 10/30/2017   Long term (current) use of anticoagulants 10/30/2017   NICM (nonischemic cardiomyopathy) 10/30/2017   Spondylolisthesis at L4-L5 level 10/30/2017   Fatigue 04/13/2014   Sinus bradycardia 08/28/2013   Coronary artery disease    Chronic systolic heart failure    Atrial fibrillation    History of asthma 07/21/2013   Moderate mitral regurgitation 07/17/2013    PCP: [name], MD   REFERRING PROVIDER: Sherryl Manges*   REFERRING DIAG:  [phone] (ICD-10-CM) - Spondylolisthesis of lumbar region  [phone] (ICD-10-CM) - Status post lumbar spine  operation    Rationale for Evaluation and Treatment: Rehabilitation  THERAPY DIAG:  Other low back pain  Unsteadiness on feet  Left lumbar radiculopathy  Unsteady gait when walking  Weakness of both hips  ONSET DATE: Jan 15, 2023 Fusion L5/S1  SUBJECTIVE:  SUBJECTIVE STATEMENT: No issues.   PERTINENT HISTORY:  01/15/23 Fusion L5/S1 with hardware removal.  Previous back surgery (L2/3, 3/4, 4/5 fusion); osteoporosis, CAD, chronic systolic heart failure, Afib, history bil foot fractures.  Sacroplasty.   PAIN:  Are you having pain? Yes: NPRS scale: 1/10 Pain location: sacrum Pain description: dull ache Aggravating factors: sitting too long, sitting on hard surfaces, sitting without back support  Relieving factors: cushions, tylenol  PRECAUTIONS: Fall  RED FLAGS: None   WEIGHT BEARING RESTRICTIONS: No  FALLS:  Has patient fallen in last 6 months? No but feels very unsteady.    LIVING ENVIRONMENT: Lives with: lives with their spouse Lives in: House/apartment Stairs: No Has following equipment at home: Dan Humphreys - 2 wheeled  OCCUPATION: disabled  PLOF: Independent  PATIENT GOALS: regain my balance, hopefully improve pain  NEXT MD VISIT: March 2025  OBJECTIVE:   DIAGNOSTIC FINDINGS:  12/07/2022 CT Lumbar Spine (PRE-OP) IMPRESSION: 1. Previous posterior decompression and discectomy at L2-3. Right-sided L3 pedicle screw does breach the superior endplate and enter the lateral disc space. No evidence of motion of either of the L3 screws. There is mild lucency and surrounding sclerosis at the left L2 screw. Possibility of mild screw motion does exist, but this does not appear pronounced. 2. Previous posterior decompression, diskectomy and fusion at L3-4 and L4-5. No evidence of motion  or hardware failure. 3. L5-S1: Bulging of the disc. Bilateral facet arthropathy worse on the left than the right. Left foraminal stenosis primarily due to osteophytic encroachment from facet arthropathy in combination with endplate osteophytes and bulging disc that could affect the exiting left L5 nerve.  PATIENT SURVEYS:  Modified Oswestry 19/50   COGNITION: Overall cognitive status: Within functional limits for tasks assessed     SENSATION: Diminished to light touch L S1 dermatome.  Reports tingling in L foot still after surgery.   MUSCLE LENGTH: NT  POSTURE:  decreased cervical lordosis  PALPATION: NA  LUMBAR ROM:   AROM eval  Flexion   Extension   Right lateral flexion   Left lateral flexion   Right rotation   Left rotation    (Blank rows = not tested)  LOWER EXTREMITY ROM:      NT today  LOWER EXTREMITY MMT:    MMT Right* eval Left* eval  Hip flexion 4+ 4  Hip extension    Hip abduction 5 5  Hip adduction 5 5  Knee flexion 5 5  Knee extension 5 5  Ankle dorsiflexion 5 5  Ankle plantarflexion 5 WB 5 WB   (Blank rows = not tested) * tested in sitting  FUNCTIONAL TESTS:  5 times sit to stand: 19.5 sec with bil UE assist (needed for eccentric control due to sacral pain) Functional gait assessment: 12/30 MCTSIB: Condition 1: Avg of 3 trials: 30 sec, Condition 2: Avg of 3 trials: 30 sec, Condition 3: Avg of 3 trials: 30 sec, Condition 4: Avg of 3 trials: 30 sec, and Total Score: 120/120   GAIT: Distance walked: 200' Assistive device utilized: None Level of assistance: Complete Independence Comments: occasionally unsteady, visually slow gait speed.   TODAY'S TREATMENT:  DATE: 04/26/23 Walking outside on uneven terrain (grass, hay, humps and hills in grass) CGA by therapist  5xSTS- 13 seconds Seated series of R/L arm raise, then  R/L trunk rotation 5x 5 reps each for core activation 04/24/2023  Therapeutic Exercise: to improve strength and mobility.  Demo, verbal and tactile cues throughout for technique. Bike x 5 min Forward RDLS x 10 each side Neuromuscular Reeducation: for balance, coordination and proprioception, In corner for safety, On level surface Eyes open feet together x 30 sec  Eyes open feet together with head nods x 10 Eyes open feet together with head turns x 10 Eyes closed feet together x 30 sec Eyes closed feet together with head nods x 10 Eyes closed feet together with head turns x 10  In corner for safety, On airex Eyes open feet apart x 30 sec  Eyes open feet apart with head nods x 10 Eyes open feet apart with head turns x 10 Eyes closed feet apart x 30 sec Eyes closed feet apart with head nods x 10 Eyes closed feet apart with head turns x 10  With CGA: Star excursion pattern: Forward, side, back, cross step x 10 each side    04/19/23  [facility] PT Assessment - 04/19/23 0001       Functional Gait  Assessment   Gait Level Surface Walks 20 ft in less than 7 sec but greater than 5.5 sec, uses assistive device, slower speed, mild gait deviations, or deviates 6-10 in outside of the 12 in walkway width.    Change in Gait Speed Able to change speed, demonstrates mild gait deviations, deviates 6-10 in outside of the 12 in walkway width, or no gait deviations, unable to achieve a major change in velocity, or uses a change in velocity, or uses an assistive device.    Gait with Horizontal Head Turns Performs head turns with moderate changes in gait velocity, slows down, deviates 10-15 in outside 12 in walkway width but recovers, can continue to walk.    Gait with Vertical Head Turns Performs task with slight change in gait velocity (eg, minor disruption to smooth gait path), deviates 6 - 10 in outside 12 in walkway width or uses assistive device    Gait and Pivot Turn Pivot turns safely in greater than 3 sec  and stops with no loss of balance, or pivot turns safely within 3 sec and stops with mild imbalance, requires small steps to catch balance.    Step Over Obstacle Is able to step over 2 stacked shoe boxes taped together (9 in total height) without changing gait speed. No evidence of imbalance.    Gait with Narrow Base of Support Ambulates 4-7 steps.    Gait with Eyes Closed Walks 20 ft, slow speed, abnormal gait pattern, evidence for imbalance, deviates 10-15 in outside 12 in walkway width. Requires more than 9 sec to ambulate 20 ft.    Ambulating Backwards Walks 20 ft, slow speed, abnormal gait pattern, evidence for imbalance, deviates 10-15 in outside 12 in walkway width.    Steps Alternating feet, must use rail.    Total Score 17           Fwd step and reach Modified SLS with trunk rotation bil Seated series of R/L arm raise, then R/L trunk rotation 5x 5 reps each for core activation Seated B shld ER and hip ABD holds 5x5" for core activation   04/10/23 Bike L2 x 5 min Neuromuscular Reeducation: for balance, coordination and  proprioception With mirror for feedback and SBA for safety: Toe taps on stool alternating Standing with one foot on stool  -Head turns   -head nods Forward steps over 1/2 foam roller x 10 bil  Side steps over 1/2 foam roller x 10 bil    04/05/23 Therapeutic Exercise: to improve strength and mobility.  Demo, verbal and tactile cues throughout for technique. Nustep L5 x 6 min Knee extension 10lb BLE x 20 Knee flexion 20lb BLE x 20  Neuromuscular Reeducation: for balance& coordination - close SBA to CGA for safety Star excursion pattern - forward, side, back, cross x 5 each side, mirror for feedback- LOB a couple of times Step ups no UE support x 10  Toe taps 8' x 10 bil Standing bird dog x 10 bil Braiding for 5 ft- cues for step length      PATIENT EDUCATION:  Education details: HEP update Person educated: Patient Education method: Explanation,  Demonstration, Verbal cues, and Handouts Education comprehension: verbalized understanding  HOME EXERCISE PROGRAM: Access Code: [email or web address] URL: [email or web address] Date: 04/10/2023 Prepared by: Harrie Foreman  Exercises - Supine Posterior Pelvic Tilt  - 1 x daily - 7 x weekly - 2 sets - 10 reps - Supine Bridge with Resistance Band  - 1 x daily - 7 x weekly - 3 sets - 10 reps - Clamshell with Resistance  - 1 x daily - 7 x weekly - 3 sets - 10 reps - Standing Gastroc Stretch on Foam 1/2 Roll  - 1 x daily - 7 x weekly - 1 sets - 3 reps - 30 hold - Standing Gastroc Stretch  - 1 x daily - 7 x weekly - 1 sets - 3 reps - 30 sec hold - Standing Tandem Balance with Counter Support  - 1 x daily - 7 x weekly - 1 sets - 3 reps - 30 sec hold - Standing Single Leg Stance with Counter Support  - 1 x daily - 7 x weekly - 1 sets - 3 reps - 30 sec  hold  ASSESSMENT:  CLINICAL IMPRESSION: Worked on walking on uneven ground to improve balance and stability, CGA required. Goal met for 5xSTS and improvement in pain. Pt is progressing well. Sharlot Gowda continues to demonstrate potential for improvement and would benefit from continued skilled therapy to address impairments.       OBJECTIVE IMPAIRMENTS: Abnormal gait, decreased activity tolerance, decreased balance, decreased endurance, difficulty walking, decreased strength, impaired perceived functional ability, and pain.   ACTIVITY LIMITATIONS: carrying, lifting, bending, standing, squatting, sleeping, transfers, and locomotion level  PARTICIPATION LIMITATIONS: meal prep, cleaning, laundry, shopping, community activity, and yard work  PERSONAL FACTORS: Behavior pattern, Fitness, Past/current experiences, and 3+ comorbidities: Previous back surgery (L2/3, 3/4, 4/5 fusion); osteoporosis, CAD, chronic systolic heart failure, Afib, history bil foot fractures  are also affecting patient's functional outcome.   REHAB POTENTIAL:  Good  CLINICAL DECISION MAKING: Evolving/moderate complexity  EVALUATION COMPLEXITY: Moderate   GOALS: Goals reviewed with patient? Yes  SHORT TERM GOALS: Target date: 03/26/2023   Patient will be independent with initial HEP.  Baseline: TBD Goal status: MET 04/03/23   LONG TERM GOALS: Target date: 05/21/2023   Patient will be independent with advanced/ongoing HEP to improve outcomes and carryover.  Baseline:  Goal status: IN PROGRESS 04/03/23 updated  2.  Patient will report 75% improvement in low back/sacral pain to improve QOL.  Baseline: constant dull ache Goal status: MET 04/26/23  3.  Patient will demonstrate improved  functional strength as demonstrated by 5x STS < 15 sec to decrease fall risk. Baseline: 19.5 sec with bil UE assist Goal status: MET- 04/26/23  4.  Patient will report at least 6 points improvement on modified Oswestry to demonstrate improved functional ability.  Baseline: 19/50 Goal status: IN PROGRESS   5.  Patient will score at least 19/30 on FGA to decrease risk of falls with injury. Baseline: 12/30 Goal status: IN PROGRESS- 04/19/23 17/30  PLAN:  PT FREQUENCY: 2x/week  PT DURATION: 10 weeks  PLANNED INTERVENTIONS: 97110-Therapeutic exercises, 97530- Therapeutic activity, 97112- Neuromuscular re-education, 97535- Self Care, [id number]- Manual therapy, 97116- Gait training, 97014- Electrical stimulation (unattended), [phone]- Electrical stimulation (manual), [id number]- Ultrasound, Patient/Family education, Balance training, Stair training, Taping, Dry Needling, Joint mobilization, Joint manipulation, Spinal mobilization, Vestibular training, Cryotherapy, and Moist heat.  PLAN FOR NEXT SESSION: core strengthening, hip strengthening, balance training,   Darleene Cleaver, PTA 04/26/2023, 12:35 PM

## 2023-05-01 ENCOUNTER — Ambulatory Visit: Payer: 59 | Admitting: Physical Therapy

## 2023-05-01 ENCOUNTER — Encounter: Payer: Self-pay | Admitting: Physical Therapy

## 2023-05-01 DIAGNOSIS — M5459 Other low back pain: Secondary | ICD-10-CM | POA: Diagnosis not present

## 2023-05-01 DIAGNOSIS — M5416 Radiculopathy, lumbar region: Secondary | ICD-10-CM

## 2023-05-01 DIAGNOSIS — R2681 Unsteadiness on feet: Secondary | ICD-10-CM

## 2023-05-01 NOTE — Therapy (Signed)
 OUTPATIENT PHYSICAL THERAPY TREATMENT   Patient Name: Kiara [name] MRN: [id number] DOB:[birth year], 63 y.o., female Today's Date: 05/01/2023  END OF SESSION:  PT End of Session - 05/01/23 1152     Visit Number 11    Date for PT Re-Evaluation 05/21/23    Authorization Type [employer]    Progress Note Due on Visit 10    PT Start Time 1150    PT Stop Time 1235    PT Time Calculation (min) 45 min    Activity Tolerance Patient tolerated treatment well    Behavior During Therapy WFL for tasks assessed/performed                   Past Medical History:  Diagnosis Date   Atrial fibrillation    a. admx with AFib with RVR and acute systolic CHF 07/2013; TEE with LAA clot and no DCCV done;     Bilateral bunions 12/27/2020   Capsulitis of metatarsophalangeal (MTP) joint of right foot 01/31/2021   Chronic systolic heart failure    Coronary artery disease    a.  LHC (07/17/13):  LM 20%, ostial CFX 20-30%   Degenerative disc disease, lumbar 10/30/2017   Degenerative scoliosis 10/30/2017   Essential hypertension 09/10/2020   Fatigue 04/13/2014   Generalized anxiety disorder 09/22/2019   History of asthma 07/21/2013   History of cervical dysplasia 12/22/2021   History of kidney stones 2013   History of lumbar spinal fusion 01/16/2019   Hypercholesterolemia 09/22/2019   Hyperlipemia    Hypothyroidism    past hx   Leg weakness, bilateral 11/28/2017   Long term (current) use of anticoagulants 10/30/2017   Loss of bladder control 11/28/2017   Lumbar radiculopathy 11/28/2017   Major depressive disorder 09/22/2019   Moderate mitral regurgitation 07/17/2013   NICM (nonischemic cardiomyopathy)    a. Echo (07/17/13):  EF 25-30%, EF subsequently normalized with sinus rhythm (tachycardia mediated CM)   Osteopenia    Overweight 07/18/2019   Sinus bradycardia 08/28/2013   Spondylolisthesis at L4-L5 level 10/30/2017   Past Surgical History:  Procedure Laterality Date   ABDOMINAL  HYSTERECTOMY  ~ 2001   ABLATION  10/28/13   PVI by Dr Johney Frame   APPENDECTOMY  1978   ATRIAL FIBRILLATION ABLATION N/A 10/28/2013   Procedure: ATRIAL FIBRILLATION ABLATION;  Surgeon: Gardiner Rhyme, MD;  Location: [facility] CATH LAB;  Service: Cardiovascular;  Laterality: N/A;   ATRIAL FIBRILLATION ABLATION N/A 11/23/2020   Procedure: ATRIAL FIBRILLATION ABLATION;  Surgeon: Hillis Range, MD;  Location: [facility] INVASIVE CV LAB;  Service: Cardiovascular;  Laterality: N/A;   BACK SURGERY Bilateral 2019   Fusion   CARDIAC CATHETERIZATION  07/2013   CARDIOVERSION N/A 08/18/2013   Procedure: CARDIOVERSION;  Surgeon: Donato Schultz, MD;  Location: [facility] ENDOSCOPY;  Service: Cardiovascular;  Laterality: N/A;   CARDIOVERSION N/A 08/29/2013   Procedure: CARDIOVERSION;  Surgeon: Pricilla Riffle, MD;  Location: [facility] OR;  Service: Cardiovascular;  Laterality: N/A;   ELECTROPHYSIOLOGIC STUDY N/A 11/05/2014   Procedure: Atrial Fibrillation Ablation;  Surgeon: Hillis Range, MD;  Location: [facility] INVASIVE CV LAB;  Service: Cardiovascular;  Laterality: N/A;   EYE SURGERY Bilateral 1999   Corrective laser surgery   implantable loop recorder removal  10/02/2018   MDT Reveal LINQ removed in office by Dr Johney Frame for EOL battery   IR SACROPLASTY BILATERAL  02/06/2019   LEFT HEART CATHETERIZATION WITH CORONARY ANGIOGRAM N/A 07/17/2013   Procedure: LEFT HEART CATHETERIZATION WITH CORONARY ANGIOGRAM;  Surgeon: Micheline Chapman,  MD;  Location: [facility] CATH LAB;  Service: Cardiovascular;  Laterality: N/A;   LOOP RECORDER IMPLANT N/A 06/05/2014   Procedure: LOOP RECORDER IMPLANT;  Surgeon: Hillis Range, MD;  Location: [facility] CATH LAB;  Service: Cardiovascular;  Laterality: N/A;   TEE WITHOUT CARDIOVERSION N/A 07/18/2013   Procedure: TRANSESOPHAGEAL ECHOCARDIOGRAM (TEE);  Surgeon: Lewayne Bunting, MD;  Location: [facility] ENDOSCOPY;  Service: Cardiovascular;  Laterality: N/A;   TEE WITHOUT CARDIOVERSION N/A 08/18/2013   Procedure: TRANSESOPHAGEAL ECHOCARDIOGRAM (TEE);   Surgeon: Donato Schultz, MD;  Location: [facility] ENDOSCOPY;  Service: Cardiovascular;  Laterality: N/A;   TEE WITHOUT CARDIOVERSION N/A 10/27/2013   Procedure: TRANSESOPHAGEAL ECHOCARDIOGRAM (TEE);  Surgeon: Pricilla Riffle, MD;  Location: [facility] ENDOSCOPY;  Service: Cardiovascular;  Laterality: N/A;   TEE WITHOUT CARDIOVERSION N/A 11/04/2014   Procedure: TRANSESOPHAGEAL ECHOCARDIOGRAM (TEE);  Surgeon: Chrystie Nose, MD;  Location: [facility] ENDOSCOPY;  Service: Cardiovascular;  Laterality: N/A;   Patient Active Problem List   Diagnosis Date Noted   Scoliosis, or kyphoscoliosis, idiopathic 11/09/2022   Osteoporosis 11/09/2022   Primary osteoarthritis of right hip 01/31/2022   Greater trochanteric bursitis of right hip 01/31/2022   History of cervical dysplasia    Capsulitis of metatarsophalangeal (MTP) joint of right foot 01/31/2021   Bilateral bunions 12/27/2020   Essential hypertension 09/10/2020   Generalized anxiety disorder 09/22/2019   Hypercholesterolemia 09/22/2019   Major depressive disorder 09/22/2019   Overweight 07/18/2019   Fracture of sacrum (HCC) 01/30/2019   History of lumbar spinal fusion 01/16/2019   Leg weakness, bilateral 11/28/2017   Loss of bladder control 11/28/2017   Lumbar radiculopathy 11/28/2017   Spinal stenosis at L4-L5 level 11/28/2017   Degenerative disc disease, lumbar 10/30/2017   Degenerative scoliosis 10/30/2017   Long term (current) use of anticoagulants 10/30/2017   NICM (nonischemic cardiomyopathy) 10/30/2017   Spondylolisthesis at L4-L5 level 10/30/2017   Fatigue 04/13/2014   Sinus bradycardia 08/28/2013   Coronary artery disease    Chronic systolic heart failure    Atrial fibrillation    History of asthma 07/21/2013   Moderate mitral regurgitation 07/17/2013    PCP: [name], MD   REFERRING PROVIDER: Sherryl Manges*   REFERRING DIAG:  [phone] (ICD-10-CM) - Spondylolisthesis of lumbar region  [phone] (ICD-10-CM) - Status post lumbar spine  operation    Rationale for Evaluation and Treatment: Rehabilitation  THERAPY DIAG:  Other low back pain  Unsteadiness on feet  Left lumbar radiculopathy  ONSET DATE: Jan 15, 2023 Fusion L5/S1  SUBJECTIVE:  SUBJECTIVE STATEMENT: Doing well.  Feels balance is still off, talked to daughter about decreasing visits and daughter concerned about falls.   PERTINENT HISTORY:  01/15/23 Fusion L5/S1 with hardware removal.  Previous back surgery (L2/3, 3/4, 4/5 fusion); osteoporosis, CAD, chronic systolic heart failure, Afib, history bil foot fractures.  Sacroplasty.   PAIN:  Are you having pain? Yes: NPRS scale: 1/10 Pain location: sacrum Pain description: dull ache Aggravating factors: sitting too long, sitting on hard surfaces, sitting without back support  Relieving factors: cushions, tylenol  PRECAUTIONS: Fall  RED FLAGS: None   WEIGHT BEARING RESTRICTIONS: No  FALLS:  Has patient fallen in last 6 months? No but feels very unsteady.    LIVING ENVIRONMENT: Lives with: lives with their spouse Lives in: House/apartment Stairs: No Has following equipment at home: Dan Humphreys - 2 wheeled  OCCUPATION: disabled  PLOF: Independent  PATIENT GOALS: regain my balance, hopefully improve pain  NEXT MD VISIT: March 2025  OBJECTIVE:   DIAGNOSTIC FINDINGS:  12/07/2022 CT Lumbar Spine (PRE-OP) IMPRESSION: 1. Previous posterior decompression and discectomy at L2-3. Right-sided L3 pedicle screw does breach the superior endplate and enter the lateral disc space. No evidence of motion of either of the L3 screws. There is mild lucency and surrounding sclerosis at the left L2 screw. Possibility of mild screw motion does exist, but this does not appear pronounced. 2. Previous posterior decompression,  diskectomy and fusion at L3-4 and L4-5. No evidence of motion or hardware failure. 3. L5-S1: Bulging of the disc. Bilateral facet arthropathy worse on the left than the right. Left foraminal stenosis primarily due to osteophytic encroachment from facet arthropathy in combination with endplate osteophytes and bulging disc that could affect the exiting left L5 nerve.  PATIENT SURVEYS:  Modified Oswestry 19/50   COGNITION: Overall cognitive status: Within functional limits for tasks assessed     SENSATION: Diminished to light touch L S1 dermatome.  Reports tingling in L foot still after surgery.   MUSCLE LENGTH: NT  POSTURE:  decreased cervical lordosis  PALPATION: NA  LUMBAR ROM:   AROM eval  Flexion   Extension   Right lateral flexion   Left lateral flexion   Right rotation   Left rotation    (Blank rows = not tested)  LOWER EXTREMITY ROM:      NT today  LOWER EXTREMITY MMT:    MMT Right* eval Left* eval  Hip flexion 4+ 4  Hip extension    Hip abduction 5 5  Hip adduction 5 5  Knee flexion 5 5  Knee extension 5 5  Ankle dorsiflexion 5 5  Ankle plantarflexion 5 WB 5 WB   (Blank rows = not tested) * tested in sitting  FUNCTIONAL TESTS:  5 times sit to stand: 19.5 sec with bil UE assist (needed for eccentric control due to sacral pain) Functional gait assessment: 12/30 MCTSIB: Condition 1: Avg of 3 trials: 30 sec, Condition 2: Avg of 3 trials: 30 sec, Condition 3: Avg of 3 trials: 30 sec, Condition 4: Avg of 3 trials: 30 sec, and Total Score: 120/120   GAIT: Distance walked: 200' Assistive device utilized: None Level of assistance: Complete Independence Comments: occasionally unsteady, visually slow gait speed.   TODAY'S TREATMENT:  DATE:  05/01/23  Therapeutic Activity:  reviewing goals and concerns Bike L2 x 10 min for warm up  while completing  modified Oswestry  Neuromuscular Reeducation: for balance, coordination and proprioception, SBA for safety In hallway- Tandem gait Retro tandem gait Gait with ball toss Side stepping with ball toss Review of HEP for corner balance exercises and update     04/26/23 Walking outside on uneven terrain (grass, hay, humps and hills in grass) CGA by therapist  5xSTS- 13 seconds Seated series of R/L arm raise, then R/L trunk rotation 5x 5 reps each for core activation 04/24/2023  Therapeutic Exercise: to improve strength and mobility.  Demo, verbal and tactile cues throughout for technique. Bike x 5 min Forward RDLS x 10 each side Neuromuscular Reeducation: for balance, coordination and proprioception, In corner for safety, On level surface Eyes open feet together x 30 sec  Eyes open feet together with head nods x 10 Eyes open feet together with head turns x 10 Eyes closed feet together x 30 sec Eyes closed feet together with head nods x 10 Eyes closed feet together with head turns x 10  In corner for safety, On airex Eyes open feet apart x 30 sec  Eyes open feet apart with head nods x 10 Eyes open feet apart with head turns x 10 Eyes closed feet apart x 30 sec Eyes closed feet apart with head nods x 10 Eyes closed feet apart with head turns x 10  With CGA: Star excursion pattern: Forward, side, back, cross step x 10 each side    04/19/23  [facility] PT Assessment - 04/19/23 0001       Functional Gait  Assessment   Gait Level Surface Walks 20 ft in less than 7 sec but greater than 5.5 sec, uses assistive device, slower speed, mild gait deviations, or deviates 6-10 in outside of the 12 in walkway width.    Change in Gait Speed Able to change speed, demonstrates mild gait deviations, deviates 6-10 in outside of the 12 in walkway width, or no gait deviations, unable to achieve a major change in velocity, or uses a change in velocity, or uses an assistive device.    Gait with  Horizontal Head Turns Performs head turns with moderate changes in gait velocity, slows down, deviates 10-15 in outside 12 in walkway width but recovers, can continue to walk.    Gait with Vertical Head Turns Performs task with slight change in gait velocity (eg, minor disruption to smooth gait path), deviates 6 - 10 in outside 12 in walkway width or uses assistive device    Gait and Pivot Turn Pivot turns safely in greater than 3 sec and stops with no loss of balance, or pivot turns safely within 3 sec and stops with mild imbalance, requires small steps to catch balance.    Step Over Obstacle Is able to step over 2 stacked shoe boxes taped together (9 in total height) without changing gait speed. No evidence of imbalance.    Gait with Narrow Base of Support Ambulates 4-7 steps.    Gait with Eyes Closed Walks 20 ft, slow speed, abnormal gait pattern, evidence for imbalance, deviates 10-15 in outside 12 in walkway width. Requires more than 9 sec to ambulate 20 ft.    Ambulating Backwards Walks 20 ft, slow speed, abnormal gait pattern, evidence for imbalance, deviates 10-15 in outside 12 in walkway width.    Steps Alternating feet, must use rail.    Total Score 17  Fwd step and reach Modified SLS with trunk rotation bil Seated series of R/L arm raise, then R/L trunk rotation 5x 5 reps each for core activation Seated B shld ER and hip ABD holds 5x5" for core activation     PATIENT EDUCATION:  Education details: HEP update Person educated: Patient Education method: Explanation, Demonstration, Verbal cues, and Handouts Education comprehension: verbalized understanding  HOME EXERCISE PROGRAM: Access Code: [email or web address] URL: [email or web address] Date: 05/01/2023 Prepared by: Harrie Foreman  Exercises - Supine Posterior Pelvic Tilt  - 1 x daily - 7 x weekly - 2 sets - 10 reps - Supine Bridge with Resistance Band  - 1 x daily - 7 x weekly - 3 sets - 10 reps -  Clamshell with Resistance  - 1 x daily - 7 x weekly - 3 sets - 10 reps - Standing Gastroc Stretch on Foam 1/2 Roll  - 1 x daily - 7 x weekly - 1 sets - 3 reps - 30 hold - Standing Gastroc Stretch  - 1 x daily - 7 x weekly - 1 sets - 3 reps - 30 sec hold - Standing Tandem Balance with Counter Support  - 1 x daily - 7 x weekly - 1 sets - 3 reps - 30 sec hold - Standing Single Leg Stance with Counter Support  - 1 x daily - 7 x weekly - 1 sets - 3 reps - 30 sec  hold - Corner Balance   - 3 x daily - 7 x weekly - Tandem Walking  - 1 x daily - 7 x weekly - 3 sets - 10 reps  ASSESSMENT:  CLINICAL IMPRESSION: Aiyannah O Proud is still extremely unsteady with gait activities with narrowed BOS, head turns, and dual tasking.  Today in hallway she was challenged with tandem gait even with hand on wall, gait slowed significantly with head turns with frequent LOB, and with ball toss again slowed significantly and with frequently unsteadiness.  Due to these balance concerns, continuing at 2x/week.  Recommended increasing frequency of corner balance exercises with head movements to 3x/day.  Modified Oswestry has improved to 19/50 and doing well from pain perspective, so will focus on balance training and gait for remaining visits.   Sharlot Gowda continues to demonstrate potential for improvement and would benefit from continued skilled therapy to address impairments.       OBJECTIVE IMPAIRMENTS: Abnormal gait, decreased activity tolerance, decreased balance, decreased endurance, difficulty walking, decreased strength, impaired perceived functional ability, and pain.   ACTIVITY LIMITATIONS: carrying, lifting, bending, standing, squatting, sleeping, transfers, and locomotion level  PARTICIPATION LIMITATIONS: meal prep, cleaning, laundry, shopping, community activity, and yard work  PERSONAL FACTORS: Behavior pattern, Fitness, Past/current experiences, and 3+ comorbidities: Previous back surgery (L2/3, 3/4, 4/5  fusion); osteoporosis, CAD, chronic systolic heart failure, Afib, history bil foot fractures  are also affecting patient's functional outcome.   REHAB POTENTIAL: Good  CLINICAL DECISION MAKING: Evolving/moderate complexity  EVALUATION COMPLEXITY: Moderate   GOALS: Goals reviewed with patient? Yes  SHORT TERM GOALS: Target date: 03/26/2023   Patient will be independent with initial HEP.  Baseline: TBD Goal status: MET 04/03/23   LONG TERM GOALS: Target date: 05/21/2023   Patient will be independent with advanced/ongoing HEP to improve outcomes and carryover.  Baseline:  Goal status: IN PROGRESS 04/03/23 updated  2.  Patient will report 75% improvement in low back/sacral pain to improve QOL.  Baseline: constant dull ache Goal status: MET 04/26/23  3.  Patient will demonstrate  improved functional strength as demonstrated by 5x STS < 15 sec to decrease fall risk. Baseline: 19.5 sec with bil UE assist Goal status: MET- 04/26/23  4.  Patient will report at least 6 points improvement on modified Oswestry to demonstrate improved functional ability.  Baseline: 19/50 Goal status: IN PROGRESS 05/01/23 16/50  5.  Patient will score at least 19/30 on FGA to decrease risk of falls with injury. Baseline: 12/30 Goal status: IN PROGRESS- 04/19/23 17/30  PLAN:  PT FREQUENCY: 2x/week  PT DURATION: 10 weeks  PLANNED INTERVENTIONS: 97110-Therapeutic exercises, 97530- Therapeutic activity, 97112- Neuromuscular re-education, 97535- Self Care, 40102- Manual therapy, 97116- Gait training, 97014- Electrical stimulation (unattended), [phone]- Electrical stimulation (manual), [id number]- Ultrasound, Patient/Family education, Balance training, Stair training, Taping, Dry Needling, Joint mobilization, Joint manipulation, Spinal mobilization, Vestibular training, Cryotherapy, and Moist heat.  PLAN FOR NEXT SESSION: focus on higher level balance training.   Jena Gauss, PT 05/01/2023, 1:28 PM

## 2023-05-03 ENCOUNTER — Ambulatory Visit: Payer: 59

## 2023-05-03 DIAGNOSIS — M5459 Other low back pain: Secondary | ICD-10-CM | POA: Diagnosis not present

## 2023-05-03 DIAGNOSIS — R29898 Other symptoms and signs involving the musculoskeletal system: Secondary | ICD-10-CM

## 2023-05-03 DIAGNOSIS — R2681 Unsteadiness on feet: Secondary | ICD-10-CM

## 2023-05-03 DIAGNOSIS — M5416 Radiculopathy, lumbar region: Secondary | ICD-10-CM

## 2023-05-03 NOTE — Therapy (Signed)
 OUTPATIENT PHYSICAL THERAPY TREATMENT   Patient Name: Kiara [name] MRN: [id number] DOB:12-29-1960, 63 y.o., female Today's Date: 05/03/2023  END OF SESSION:  PT End of Session - 05/03/23 1233     Visit Number 12    Date for PT Re-Evaluation 05/21/23    Authorization Type [employer]    Progress Note Due on Visit 10    PT Start Time 1151    PT Stop Time 1230    PT Time Calculation (min) 39 min    Activity Tolerance Patient tolerated treatment well    Behavior During Therapy WFL for tasks assessed/performed                    Past Medical History:  Diagnosis Date   Atrial fibrillation    a. admx with AFib with RVR and acute systolic CHF 07/2013; TEE with LAA clot and no DCCV done;     Bilateral bunions 12/27/2020   Capsulitis of metatarsophalangeal (MTP) joint of right foot 01/31/2021   Chronic systolic heart failure    Coronary artery disease    a.  LHC (07/17/13):  LM 20%, ostial CFX 20-30%   Degenerative disc disease, lumbar 10/30/2017   Degenerative scoliosis 10/30/2017   Essential hypertension 09/10/2020   Fatigue 04/13/2014   Generalized anxiety disorder 09/22/2019   History of asthma 07/21/2013   History of cervical dysplasia 12/22/2021   History of kidney stones 2013   History of lumbar spinal fusion 01/16/2019   Hypercholesterolemia 09/22/2019   Hyperlipemia    Hypothyroidism    past hx   Leg weakness, bilateral 11/28/2017   Long term (current) use of anticoagulants 10/30/2017   Loss of bladder control 11/28/2017   Lumbar radiculopathy 11/28/2017   Major depressive disorder 09/22/2019   Moderate mitral regurgitation 07/17/2013   NICM (nonischemic cardiomyopathy)    a. Echo (07/17/13):  EF 25-30%, EF subsequently normalized with sinus rhythm (tachycardia mediated CM)   Osteopenia    Overweight 07/18/2019   Sinus bradycardia 08/28/2013   Spondylolisthesis at L4-L5 level 10/30/2017   Past Surgical History:  Procedure Laterality Date   ABDOMINAL  HYSTERECTOMY  ~ 2001   ABLATION  10/28/13   PVI by Dr Johney Frame   APPENDECTOMY  1978   ATRIAL FIBRILLATION ABLATION N/A 10/28/2013   Procedure: ATRIAL FIBRILLATION ABLATION;  Surgeon: Gardiner Rhyme, MD;  Location: [facility] CATH LAB;  Service: Cardiovascular;  Laterality: N/A;   ATRIAL FIBRILLATION ABLATION N/A 11/23/2020   Procedure: ATRIAL FIBRILLATION ABLATION;  Surgeon: Hillis Range, MD;  Location: [facility] INVASIVE CV LAB;  Service: Cardiovascular;  Laterality: N/A;   BACK SURGERY Bilateral 2019   Fusion   CARDIAC CATHETERIZATION  07/2013   CARDIOVERSION N/A 08/18/2013   Procedure: CARDIOVERSION;  Surgeon: Donato Schultz, MD;  Location: [facility] ENDOSCOPY;  Service: Cardiovascular;  Laterality: N/A;   CARDIOVERSION N/A 08/29/2013   Procedure: CARDIOVERSION;  Surgeon: Pricilla Riffle, MD;  Location: [facility] OR;  Service: Cardiovascular;  Laterality: N/A;   ELECTROPHYSIOLOGIC STUDY N/A 11/05/2014   Procedure: Atrial Fibrillation Ablation;  Surgeon: Hillis Range, MD;  Location: [facility] INVASIVE CV LAB;  Service: Cardiovascular;  Laterality: N/A;   EYE SURGERY Bilateral 1999   Corrective laser surgery   implantable loop recorder removal  10/02/2018   MDT Reveal LINQ removed in office by Dr Johney Frame for EOL battery   IR SACROPLASTY BILATERAL  02/06/2019   LEFT HEART CATHETERIZATION WITH CORONARY ANGIOGRAM N/A 07/17/2013   Procedure: LEFT HEART CATHETERIZATION WITH CORONARY ANGIOGRAM;  Surgeon: Veverly Fells  Excell Seltzer, MD;  Location: [facility] CATH LAB;  Service: Cardiovascular;  Laterality: N/A;   LOOP RECORDER IMPLANT N/A 06/05/2014   Procedure: LOOP RECORDER IMPLANT;  Surgeon: Hillis Range, MD;  Location: [facility] CATH LAB;  Service: Cardiovascular;  Laterality: N/A;   TEE WITHOUT CARDIOVERSION N/A 07/18/2013   Procedure: TRANSESOPHAGEAL ECHOCARDIOGRAM (TEE);  Surgeon: Lewayne Bunting, MD;  Location: [facility] ENDOSCOPY;  Service: Cardiovascular;  Laterality: N/A;   TEE WITHOUT CARDIOVERSION N/A 08/18/2013   Procedure: TRANSESOPHAGEAL ECHOCARDIOGRAM (TEE);   Surgeon: Donato Schultz, MD;  Location: [facility] ENDOSCOPY;  Service: Cardiovascular;  Laterality: N/A;   TEE WITHOUT CARDIOVERSION N/A 10/27/2013   Procedure: TRANSESOPHAGEAL ECHOCARDIOGRAM (TEE);  Surgeon: Pricilla Riffle, MD;  Location: [facility] ENDOSCOPY;  Service: Cardiovascular;  Laterality: N/A;   TEE WITHOUT CARDIOVERSION N/A 11/04/2014   Procedure: TRANSESOPHAGEAL ECHOCARDIOGRAM (TEE);  Surgeon: Chrystie Nose, MD;  Location: [facility] ENDOSCOPY;  Service: Cardiovascular;  Laterality: N/A;   Patient Active Problem List   Diagnosis Date Noted   Scoliosis, or kyphoscoliosis, idiopathic 11/09/2022   Osteoporosis 11/09/2022   Primary osteoarthritis of right hip 01/31/2022   Greater trochanteric bursitis of right hip 01/31/2022   History of cervical dysplasia    Capsulitis of metatarsophalangeal (MTP) joint of right foot 01/31/2021   Bilateral bunions 12/27/2020   Essential hypertension 09/10/2020   Generalized anxiety disorder 09/22/2019   Hypercholesterolemia 09/22/2019   Major depressive disorder 09/22/2019   Overweight 07/18/2019   Fracture of sacrum (HCC) 01/30/2019   History of lumbar spinal fusion 01/16/2019   Leg weakness, bilateral 11/28/2017   Loss of bladder control 11/28/2017   Lumbar radiculopathy 11/28/2017   Spinal stenosis at L4-L5 level 11/28/2017   Degenerative disc disease, lumbar 10/30/2017   Degenerative scoliosis 10/30/2017   Long term (current) use of anticoagulants 10/30/2017   NICM (nonischemic cardiomyopathy) 10/30/2017   Spondylolisthesis at L4-L5 level 10/30/2017   Fatigue 04/13/2014   Sinus bradycardia 08/28/2013   Coronary artery disease    Chronic systolic heart failure    Atrial fibrillation    History of asthma 07/21/2013   Moderate mitral regurgitation 07/17/2013    PCP: [name], MD   REFERRING PROVIDER: Sherryl Manges*   REFERRING DIAG:  [phone] (ICD-10-CM) - Spondylolisthesis of lumbar region  [phone] (ICD-10-CM) - Status post lumbar spine  operation    Rationale for Evaluation and Treatment: Rehabilitation  THERAPY DIAG:  Other low back pain  Unsteadiness on feet  Left lumbar radiculopathy  Unsteady gait when walking  Weakness of both hips  ONSET DATE: Jan 15, 2023 Fusion L5/S1  SUBJECTIVE:  SUBJECTIVE STATEMENT: Doing well.  Feels balance is still off, talked to daughter about decreasing visits and daughter concerned about falls.   PERTINENT HISTORY:  01/15/23 Fusion L5/S1 with hardware removal.  Previous back surgery (L2/3, 3/4, 4/5 fusion); osteoporosis, CAD, chronic systolic heart failure, Afib, history bil foot fractures.  Sacroplasty.   PAIN:  Are you having pain? Yes: NPRS scale: 1/10 Pain location: sacrum Pain description: dull ache Aggravating factors: sitting too long, sitting on hard surfaces, sitting without back support  Relieving factors: cushions, tylenol  PRECAUTIONS: Fall  RED FLAGS: None   WEIGHT BEARING RESTRICTIONS: No  FALLS:  Has patient fallen in last 6 months? No but feels very unsteady.    LIVING ENVIRONMENT: Lives with: lives with their spouse Lives in: House/apartment Stairs: No Has following equipment at home: Dan Humphreys - 2 wheeled  OCCUPATION: disabled  PLOF: Independent  PATIENT GOALS: regain my balance, hopefully improve pain  NEXT MD VISIT: March 2025  OBJECTIVE:   DIAGNOSTIC FINDINGS:  12/07/2022 CT Lumbar Spine (PRE-OP) IMPRESSION: 1. Previous posterior decompression and discectomy at L2-3. Right-sided L3 pedicle screw does breach the superior endplate and enter the lateral disc space. No evidence of motion of either of the L3 screws. There is mild lucency and surrounding sclerosis at the left L2 screw. Possibility of mild screw motion does exist, but this does not appear  pronounced. 2. Previous posterior decompression, diskectomy and fusion at L3-4 and L4-5. No evidence of motion or hardware failure. 3. L5-S1: Bulging of the disc. Bilateral facet arthropathy worse on the left than the right. Left foraminal stenosis primarily due to osteophytic encroachment from facet arthropathy in combination with endplate osteophytes and bulging disc that could affect the exiting left L5 nerve.  PATIENT SURVEYS:  Modified Oswestry 19/50   COGNITION: Overall cognitive status: Within functional limits for tasks assessed     SENSATION: Diminished to light touch L S1 dermatome.  Reports tingling in L foot still after surgery.   MUSCLE LENGTH: NT  POSTURE:  decreased cervical lordosis  PALPATION: NA  LUMBAR ROM:   AROM eval  Flexion   Extension   Right lateral flexion   Left lateral flexion   Right rotation   Left rotation    (Blank rows = not tested)  LOWER EXTREMITY ROM:      NT today  LOWER EXTREMITY MMT:    MMT Right* eval Left* eval  Hip flexion 4+ 4  Hip extension    Hip abduction 5 5  Hip adduction 5 5  Knee flexion 5 5  Knee extension 5 5  Ankle dorsiflexion 5 5  Ankle plantarflexion 5 WB 5 WB   (Blank rows = not tested) * tested in sitting  FUNCTIONAL TESTS:  5 times sit to stand: 19.5 sec with bil UE assist (needed for eccentric control due to sacral pain) Functional gait assessment: 12/30 MCTSIB: Condition 1: Avg of 3 trials: 30 sec, Condition 2: Avg of 3 trials: 30 sec, Condition 3: Avg of 3 trials: 30 sec, Condition 4: Avg of 3 trials: 30 sec, and Total Score: 120/120   GAIT: Distance walked: 200' Assistive device utilized: None Level of assistance: Complete Independence Comments: occasionally unsteady, visually slow gait speed.   TODAY'S TREATMENT:  DATE: 05/03/23 Recumbent bike  L3x52min NEUROMUSCULAR RE-EDUCATION: To improve proprioception, balance, and kinesthesia. In hallway: - Tandem gait - walking with horiz and vert head turns - walking w/ horiz head turns counting up by two's - Walking with quick reactions- stopping, turning around, retro gait - braiding both ways - gait while searching for colored cones hidden around the clinic  05/01/23  Therapeutic Activity:  reviewing goals and concerns Bike L2 x 10 min for warm up while completing  modified Oswestry  Neuromuscular Reeducation: for balance, coordination and proprioception, SBA for safety In hallway- Tandem gait Retro tandem gait Gait with ball toss Side stepping with ball toss Review of HEP for corner balance exercises and update     04/26/23 Walking outside on uneven terrain (grass, hay, humps and hills in grass) CGA by therapist  5xSTS- 13 seconds Seated series of R/L arm raise, then R/L trunk rotation 5x 5 reps each for core activation 04/24/2023  Therapeutic Exercise: to improve strength and mobility.  Demo, verbal and tactile cues throughout for technique. Bike x 5 min Forward RDLS x 10 each side Neuromuscular Reeducation: for balance, coordination and proprioception, In corner for safety, On level surface Eyes open feet together x 30 sec  Eyes open feet together with head nods x 10 Eyes open feet together with head turns x 10 Eyes closed feet together x 30 sec Eyes closed feet together with head nods x 10 Eyes closed feet together with head turns x 10  In corner for safety, On airex Eyes open feet apart x 30 sec  Eyes open feet apart with head nods x 10 Eyes open feet apart with head turns x 10 Eyes closed feet apart x 30 sec Eyes closed feet apart with head nods x 10 Eyes closed feet apart with head turns x 10  With CGA: Star excursion pattern: Forward, side, back, cross step x 10 each side    04/19/23  [facility] PT Assessment - 04/19/23 0001       Functional Gait  Assessment    Gait Level Surface Walks 20 ft in less than 7 sec but greater than 5.5 sec, uses assistive device, slower speed, mild gait deviations, or deviates 6-10 in outside of the 12 in walkway width.    Change in Gait Speed Able to change speed, demonstrates mild gait deviations, deviates 6-10 in outside of the 12 in walkway width, or no gait deviations, unable to achieve a major change in velocity, or uses a change in velocity, or uses an assistive device.    Gait with Horizontal Head Turns Performs head turns with moderate changes in gait velocity, slows down, deviates 10-15 in outside 12 in walkway width but recovers, can continue to walk.    Gait with Vertical Head Turns Performs task with slight change in gait velocity (eg, minor disruption to smooth gait path), deviates 6 - 10 in outside 12 in walkway width or uses assistive device    Gait and Pivot Turn Pivot turns safely in greater than 3 sec and stops with no loss of balance, or pivot turns safely within 3 sec and stops with mild imbalance, requires small steps to catch balance.    Step Over Obstacle Is able to step over 2 stacked shoe boxes taped together (9 in total height) without changing gait speed. No evidence of imbalance.    Gait with Narrow Base of Support Ambulates 4-7 steps.    Gait with Eyes Closed Walks 20 ft, slow speed, abnormal gait  pattern, evidence for imbalance, deviates 10-15 in outside 12 in walkway width. Requires more than 9 sec to ambulate 20 ft.    Ambulating Backwards Walks 20 ft, slow speed, abnormal gait pattern, evidence for imbalance, deviates 10-15 in outside 12 in walkway width.    Steps Alternating feet, must use rail.    Total Score 17           Fwd step and reach Modified SLS with trunk rotation bil Seated series of R/L arm raise, then R/L trunk rotation 5x 5 reps each for core activation Seated B shld ER and hip ABD holds 5x5" for core activation     PATIENT EDUCATION:  Education details: HEP  update Person educated: Patient Education method: Explanation, Demonstration, Verbal cues, and Handouts Education comprehension: verbalized understanding  HOME EXERCISE PROGRAM: Access Code: [email or web address] URL: [email or web address] Date: 05/01/2023 Prepared by: Harrie Foreman  Exercises - Supine Posterior Pelvic Tilt  - 1 x daily - 7 x weekly - 2 sets - 10 reps - Supine Bridge with Resistance Band  - 1 x daily - 7 x weekly - 3 sets - 10 reps - Clamshell with Resistance  - 1 x daily - 7 x weekly - 3 sets - 10 reps - Standing Gastroc Stretch on Foam 1/2 Roll  - 1 x daily - 7 x weekly - 1 sets - 3 reps - 30 hold - Standing Gastroc Stretch  - 1 x daily - 7 x weekly - 1 sets - 3 reps - 30 sec hold - Standing Tandem Balance with Counter Support  - 1 x daily - 7 x weekly - 1 sets - 3 reps - 30 sec hold - Standing Single Leg Stance with Counter Support  - 1 x daily - 7 x weekly - 1 sets - 3 reps - 30 sec  hold - Corner Balance   - 3 x daily - 7 x weekly - Tandem Walking  - 1 x daily - 7 x weekly - 3 sets - 10 reps  ASSESSMENT:  CLINICAL IMPRESSION: Jaydalyn O Crooker is still extremely unsteady with gait activities with narrowed BOS, head turns, and dual tasking. Continued focusing on dynamic balance activities today, pt does require quite a bit of CGA throughout session. Encouraged her to continue her balance exercises at home, while we continue to progress in PT. Seven O Zipp continues to demonstrate potential for improvement and would benefit from continued skilled therapy to address impairments.       OBJECTIVE IMPAIRMENTS: Abnormal gait, decreased activity tolerance, decreased balance, decreased endurance, difficulty walking, decreased strength, impaired perceived functional ability, and pain.   ACTIVITY LIMITATIONS: carrying, lifting, bending, standing, squatting, sleeping, transfers, and locomotion level  PARTICIPATION LIMITATIONS: meal prep, cleaning, laundry, shopping,  community activity, and yard work  PERSONAL FACTORS: Behavior pattern, Fitness, Past/current experiences, and 3+ comorbidities: Previous back surgery (L2/3, 3/4, 4/5 fusion); osteoporosis, CAD, chronic systolic heart failure, Afib, history bil foot fractures  are also affecting patient's functional outcome.   REHAB POTENTIAL: Good  CLINICAL DECISION MAKING: Evolving/moderate complexity  EVALUATION COMPLEXITY: Moderate   GOALS: Goals reviewed with patient? Yes  SHORT TERM GOALS: Target date: 03/26/2023   Patient will be independent with initial HEP.  Baseline: TBD Goal status: MET 04/03/23   LONG TERM GOALS: Target date: 05/21/2023   Patient will be independent with advanced/ongoing HEP to improve outcomes and carryover.  Baseline:  Goal status: IN PROGRESS 04/03/23 updated  2.  Patient will report 75% improvement in low  back/sacral pain to improve QOL.  Baseline: constant dull ache Goal status: MET 04/26/23  3.  Patient will demonstrate improved functional strength as demonstrated by 5x STS < 15 sec to decrease fall risk. Baseline: 19.5 sec with bil UE assist Goal status: MET- 04/26/23  4.  Patient will report at least 6 points improvement on modified Oswestry to demonstrate improved functional ability.  Baseline: 19/50 Goal status: IN PROGRESS 05/01/23 16/50  5.  Patient will score at least 19/30 on FGA to decrease risk of falls with injury. Baseline: 12/30 Goal status: IN PROGRESS- 04/19/23 17/30  PLAN:  PT FREQUENCY: 2x/week  PT DURATION: 10 weeks  PLANNED INTERVENTIONS: 97110-Therapeutic exercises, 97530- Therapeutic activity, 97112- Neuromuscular re-education, 97535- Self Care, 40102- Manual therapy, 97116- Gait training, 97014- Electrical stimulation (unattended), [phone]- Electrical stimulation (manual), [id number]- Ultrasound, Patient/Family education, Balance training, Stair training, Taping, Dry Needling, Joint mobilization, Joint manipulation, Spinal mobilization, Vestibular  training, Cryotherapy, and Moist heat.  PLAN FOR NEXT SESSION: focus on higher level balance training.   Darleene Cleaver, PTA 05/03/2023, 12:33 PM

## 2023-05-05 ENCOUNTER — Other Ambulatory Visit: Payer: Self-pay | Admitting: Student

## 2023-05-07 ENCOUNTER — Ambulatory Visit: Payer: 59 | Admitting: Behavioral Health

## 2023-05-08 ENCOUNTER — Encounter: Payer: Self-pay | Admitting: Physical Therapy

## 2023-05-08 ENCOUNTER — Ambulatory Visit: Admitting: Behavioral Health

## 2023-05-08 ENCOUNTER — Encounter: Payer: Self-pay | Admitting: Behavioral Health

## 2023-05-08 ENCOUNTER — Ambulatory Visit: Payer: 59 | Admitting: Physical Therapy

## 2023-05-08 DIAGNOSIS — M5459 Other low back pain: Secondary | ICD-10-CM

## 2023-05-08 DIAGNOSIS — F4321 Adjustment disorder with depressed mood: Secondary | ICD-10-CM

## 2023-05-08 DIAGNOSIS — R2681 Unsteadiness on feet: Secondary | ICD-10-CM

## 2023-05-08 NOTE — Therapy (Signed)
 OUTPATIENT PHYSICAL THERAPY TREATMENT   Patient Name: Kiara [name] MRN: [id number] DOB:02-Oct-1960, 63 y.o., female Today's Date: 05/08/2023  END OF SESSION:  PT End of Session - 05/08/23 1210     Visit Number 13    Date for PT Re-Evaluation 05/21/23    Authorization Type [employer]    Progress Note Due on Visit 10    PT Start Time 1152    PT Stop Time 1230    PT Time Calculation (min) 38 min    Activity Tolerance Patient tolerated treatment well    Behavior During Therapy WFL for tasks assessed/performed                    Past Medical History:  Diagnosis Date   Atrial fibrillation    a. admx with AFib with RVR and acute systolic CHF 07/2013; TEE with LAA clot and no DCCV done;     Bilateral bunions 12/27/2020   Capsulitis of metatarsophalangeal (MTP) joint of right foot 01/31/2021   Chronic systolic heart failure    Coronary artery disease    a.  LHC (07/17/13):  LM 20%, ostial CFX 20-30%   Degenerative disc disease, lumbar 10/30/2017   Degenerative scoliosis 10/30/2017   Essential hypertension 09/10/2020   Fatigue 04/13/2014   Generalized anxiety disorder 09/22/2019   History of asthma 07/21/2013   History of cervical dysplasia 12/22/2021   History of kidney stones 2013   History of lumbar spinal fusion 01/16/2019   Hypercholesterolemia 09/22/2019   Hyperlipemia    Hypothyroidism    past hx   Leg weakness, bilateral 11/28/2017   Long term (current) use of anticoagulants 10/30/2017   Loss of bladder control 11/28/2017   Lumbar radiculopathy 11/28/2017   Major depressive disorder 09/22/2019   Moderate mitral regurgitation 07/17/2013   NICM (nonischemic cardiomyopathy)    a. Echo (07/17/13):  EF 25-30%, EF subsequently normalized with sinus rhythm (tachycardia mediated CM)   Osteopenia    Overweight 07/18/2019   Sinus bradycardia 08/28/2013   Spondylolisthesis at L4-L5 level 10/30/2017   Past Surgical History:  Procedure Laterality Date   ABDOMINAL  HYSTERECTOMY  ~ 2001   ABLATION  10/28/13   PVI by Dr Johney Frame   APPENDECTOMY  1978   ATRIAL FIBRILLATION ABLATION N/A 10/28/2013   Procedure: ATRIAL FIBRILLATION ABLATION;  Surgeon: Gardiner Rhyme, MD;  Location: [facility] CATH LAB;  Service: Cardiovascular;  Laterality: N/A;   ATRIAL FIBRILLATION ABLATION N/A 11/23/2020   Procedure: ATRIAL FIBRILLATION ABLATION;  Surgeon: Hillis Range, MD;  Location: [facility] INVASIVE CV LAB;  Service: Cardiovascular;  Laterality: N/A;   BACK SURGERY Bilateral 2019   Fusion   CARDIAC CATHETERIZATION  07/2013   CARDIOVERSION N/A 08/18/2013   Procedure: CARDIOVERSION;  Surgeon: Donato Schultz, MD;  Location: [facility] ENDOSCOPY;  Service: Cardiovascular;  Laterality: N/A;   CARDIOVERSION N/A 08/29/2013   Procedure: CARDIOVERSION;  Surgeon: Pricilla Riffle, MD;  Location: [facility] OR;  Service: Cardiovascular;  Laterality: N/A;   ELECTROPHYSIOLOGIC STUDY N/A 11/05/2014   Procedure: Atrial Fibrillation Ablation;  Surgeon: Hillis Range, MD;  Location: [facility] INVASIVE CV LAB;  Service: Cardiovascular;  Laterality: N/A;   EYE SURGERY Bilateral 1999   Corrective laser surgery   implantable loop recorder removal  10/02/2018   MDT Reveal LINQ removed in office by Dr Johney Frame for EOL battery   IR SACROPLASTY BILATERAL  02/06/2019   LEFT HEART CATHETERIZATION WITH CORONARY ANGIOGRAM N/A 07/17/2013   Procedure: LEFT HEART CATHETERIZATION WITH CORONARY ANGIOGRAM;  Surgeon: Veverly Fells  Excell Seltzer, MD;  Location: [facility] CATH LAB;  Service: Cardiovascular;  Laterality: N/A;   LOOP RECORDER IMPLANT N/A 06/05/2014   Procedure: LOOP RECORDER IMPLANT;  Surgeon: Hillis Range, MD;  Location: [facility] CATH LAB;  Service: Cardiovascular;  Laterality: N/A;   TEE WITHOUT CARDIOVERSION N/A 07/18/2013   Procedure: TRANSESOPHAGEAL ECHOCARDIOGRAM (TEE);  Surgeon: Lewayne Bunting, MD;  Location: [facility] ENDOSCOPY;  Service: Cardiovascular;  Laterality: N/A;   TEE WITHOUT CARDIOVERSION N/A 08/18/2013   Procedure: TRANSESOPHAGEAL ECHOCARDIOGRAM (TEE);   Surgeon: Donato Schultz, MD;  Location: [facility] ENDOSCOPY;  Service: Cardiovascular;  Laterality: N/A;   TEE WITHOUT CARDIOVERSION N/A 10/27/2013   Procedure: TRANSESOPHAGEAL ECHOCARDIOGRAM (TEE);  Surgeon: Pricilla Riffle, MD;  Location: [facility] ENDOSCOPY;  Service: Cardiovascular;  Laterality: N/A;   TEE WITHOUT CARDIOVERSION N/A 11/04/2014   Procedure: TRANSESOPHAGEAL ECHOCARDIOGRAM (TEE);  Surgeon: Chrystie Nose, MD;  Location: [facility] ENDOSCOPY;  Service: Cardiovascular;  Laterality: N/A;   Patient Active Problem List   Diagnosis Date Noted   Scoliosis, or kyphoscoliosis, idiopathic 11/09/2022   Osteoporosis 11/09/2022   Primary osteoarthritis of right hip 01/31/2022   Greater trochanteric bursitis of right hip 01/31/2022   History of cervical dysplasia    Capsulitis of metatarsophalangeal (MTP) joint of right foot 01/31/2021   Bilateral bunions 12/27/2020   Essential hypertension 09/10/2020   Generalized anxiety disorder 09/22/2019   Hypercholesterolemia 09/22/2019   Major depressive disorder 09/22/2019   Overweight 07/18/2019   Fracture of sacrum (HCC) 01/30/2019   History of lumbar spinal fusion 01/16/2019   Leg weakness, bilateral 11/28/2017   Loss of bladder control 11/28/2017   Lumbar radiculopathy 11/28/2017   Spinal stenosis at L4-L5 level 11/28/2017   Degenerative disc disease, lumbar 10/30/2017   Degenerative scoliosis 10/30/2017   Long term (current) use of anticoagulants 10/30/2017   NICM (nonischemic cardiomyopathy) 10/30/2017   Spondylolisthesis at L4-L5 level 10/30/2017   Fatigue 04/13/2014   Sinus bradycardia 08/28/2013   Coronary artery disease    Chronic systolic heart failure    Atrial fibrillation    History of asthma 07/21/2013   Moderate mitral regurgitation 07/17/2013    PCP: [name], MD   REFERRING PROVIDER: Sherryl Manges*   REFERRING DIAG:  [phone] (ICD-10-CM) - Spondylolisthesis of lumbar region  [phone] (ICD-10-CM) - Status post lumbar spine  operation    Rationale for Evaluation and Treatment: Rehabilitation  THERAPY DIAG:  Other low back pain  Unsteadiness on feet  ONSET DATE: Jan 15, 2023 Fusion L5/S1  SUBJECTIVE:  SUBJECTIVE STATEMENT: Tired today, otherwise doing well.   PERTINENT HISTORY:  01/15/23 Fusion L5/S1 with hardware removal.  Previous back surgery (L2/3, 3/4, 4/5 fusion); osteoporosis, CAD, chronic systolic heart failure, Afib, history bil foot fractures.  Sacroplasty.   PAIN:  Are you having pain? Yes: NPRS scale: 1/10 Pain location: sacrum Pain description: dull ache Aggravating factors: sitting too long, sitting on hard surfaces, sitting without back support  Relieving factors: cushions, tylenol  PRECAUTIONS: Fall  RED FLAGS: None   WEIGHT BEARING RESTRICTIONS: No  FALLS:  Has patient fallen in last 6 months? No but feels very unsteady.    LIVING ENVIRONMENT: Lives with: lives with their spouse Lives in: House/apartment Stairs: No Has following equipment at home: Dan Humphreys - 2 wheeled  OCCUPATION: disabled  PLOF: Independent  PATIENT GOALS: regain my balance, hopefully improve pain  NEXT MD VISIT: March 2025  OBJECTIVE:   DIAGNOSTIC FINDINGS:  12/07/2022 CT Lumbar Spine (PRE-OP) IMPRESSION: 1. Previous posterior decompression and discectomy at L2-3. Right-sided L3 pedicle screw does breach the superior endplate and enter the lateral disc space. No evidence of motion of either of the L3 screws. There is mild lucency and surrounding sclerosis at the left L2 screw. Possibility of mild screw motion does exist, but this does not appear pronounced. 2. Previous posterior decompression, diskectomy and fusion at L3-4 and L4-5. No evidence of motion or hardware failure. 3. L5-S1: Bulging of the disc.  Bilateral facet arthropathy worse on the left than the right. Left foraminal stenosis primarily due to osteophytic encroachment from facet arthropathy in combination with endplate osteophytes and bulging disc that could affect the exiting left L5 nerve.  PATIENT SURVEYS:  Modified Oswestry 19/50   COGNITION: Overall cognitive status: Within functional limits for tasks assessed     SENSATION: Diminished to light touch L S1 dermatome.  Reports tingling in L foot still after surgery.   MUSCLE LENGTH: NT  POSTURE:  decreased cervical lordosis  PALPATION: NA  LUMBAR ROM:   AROM eval  Flexion   Extension   Right lateral flexion   Left lateral flexion   Right rotation   Left rotation    (Blank rows = not tested)  LOWER EXTREMITY ROM:      NT today  LOWER EXTREMITY MMT:    MMT Right* eval Left* eval  Hip flexion 4+ 4  Hip extension    Hip abduction 5 5  Hip adduction 5 5  Knee flexion 5 5  Knee extension 5 5  Ankle dorsiflexion 5 5  Ankle plantarflexion 5 WB 5 WB   (Blank rows = not tested) * tested in sitting  FUNCTIONAL TESTS:  5 times sit to stand: 19.5 sec with bil UE assist (needed for eccentric control due to sacral pain) Functional gait assessment: 12/30 MCTSIB: Condition 1: Avg of 3 trials: 30 sec, Condition 2: Avg of 3 trials: 30 sec, Condition 3: Avg of 3 trials: 30 sec, Condition 4: Avg of 3 trials: 30 sec, and Total Score: 120/120   GAIT: Distance walked: 200' Assistive device utilized: None Level of assistance: Complete Independence Comments: occasionally unsteady, visually slow gait speed.   TODAY'S TREATMENT:  DATE: 05/08/23 Neuromuscular Reeducation: for balance, coordination and proprioception.  In hallway with SBA for safety.  Gait X 600' for warm-up Gait with head turns x 150' Gait with head nods x 150' Tandem  walking x 50' Reverse tandem walking x 50' Cross steps x 50' each direction Braiding x 50' each direction Star excursion pattern taps 4 directions x 5 - minA to steady Therapeutic Activity:   Safety with rebounder exercises and modifications (plank on wall v. On rebounder, weight shifts rather than jumping, video link given and key words)    05/03/23 Recumbent bike L3x67min NEUROMUSCULAR RE-EDUCATION: To improve proprioception, balance, and kinesthesia. In hallway: - Tandem gait - walking with horiz and vert head turns - walking w/ horiz head turns counting up by two's - Walking with quick reactions- stopping, turning around, retro gait - braiding both ways - gait while searching for colored cones hidden around the clinic  05/01/23  Therapeutic Activity:  reviewing goals and concerns Bike L2 x 10 min for warm up while completing  modified Oswestry  Neuromuscular Reeducation: for balance, coordination and proprioception, SBA for safety In hallway- Tandem gait Retro tandem gait Gait with ball toss Side stepping with ball toss Review of HEP for corner balance exercises and update     04/26/23 Walking outside on uneven terrain (grass, hay, humps and hills in grass) CGA by therapist  5xSTS- 13 seconds Seated series of R/L arm raise, then R/L trunk rotation 5x 5 reps each for core activation 04/24/2023  Therapeutic Exercise: to improve strength and mobility.  Demo, verbal and tactile cues throughout for technique. Bike x 5 min Forward RDLS x 10 each side Neuromuscular Reeducation: for balance, coordination and proprioception, In corner for safety, On level surface Eyes open feet together x 30 sec  Eyes open feet together with head nods x 10 Eyes open feet together with head turns x 10 Eyes closed feet together x 30 sec Eyes closed feet together with head nods x 10 Eyes closed feet together with head turns x 10  In corner for safety, On airex Eyes open feet apart x 30 sec  Eyes  open feet apart with head nods x 10 Eyes open feet apart with head turns x 10 Eyes closed feet apart x 30 sec Eyes closed feet apart with head nods x 10 Eyes closed feet apart with head turns x 10  With CGA: Star excursion pattern: Forward, side, back, cross step x 10 each side    PATIENT EDUCATION:  Education details: education on rebounding on modifications for safety Person educated: Patient Education method: Explanation, Demonstration, Verbal cues, and Handouts Education comprehension: verbalized understanding  HOME EXERCISE PROGRAM: Access Code: [email or web address] URL: [email or web address] Date: 05/01/2023 Prepared by: Harrie Foreman  Exercises - Supine Posterior Pelvic Tilt  - 1 x daily - 7 x weekly - 2 sets - 10 reps - Supine Bridge with Resistance Band  - 1 x daily - 7 x weekly - 3 sets - 10 reps - Clamshell with Resistance  - 1 x daily - 7 x weekly - 3 sets - 10 reps - Standing Gastroc Stretch on Foam 1/2 Roll  - 1 x daily - 7 x weekly - 1 sets - 3 reps - 30 hold - Standing Gastroc Stretch  - 1 x daily - 7 x weekly - 1 sets - 3 reps - 30 sec hold - Standing Tandem Balance with Counter Support  - 1 x daily - 7 x weekly - 1 sets -  3 reps - 30 sec hold - Standing Single Leg Stance with Counter Support  - 1 x daily - 7 x weekly - 1 sets - 3 reps - 30 sec  hold - Corner Balance   - 3 x daily - 7 x weekly - Tandem Walking  - 1 x daily - 7 x weekly - 3 sets - 10 reps  ASSESSMENT:  CLINICAL IMPRESSION: Continued working on higher level balance exercises, still very challenged with head turns, improved with tandem walking though.  She has rebounder at home, interested in starting to use it, it does have handle, so discussed and demonstrated appropriate exercises, primarily weight shifting, to avoid jarring back.  Kiara [name] continues to demonstrate potential for improvement and would benefit from continued skilled therapy to address impairments.       OBJECTIVE  IMPAIRMENTS: Abnormal gait, decreased activity tolerance, decreased balance, decreased endurance, difficulty walking, decreased strength, impaired perceived functional ability, and pain.   ACTIVITY LIMITATIONS: carrying, lifting, bending, standing, squatting, sleeping, transfers, and locomotion level  PARTICIPATION LIMITATIONS: meal prep, cleaning, laundry, shopping, community activity, and yard work  PERSONAL FACTORS: Behavior pattern, Fitness, Past/current experiences, and 3+ comorbidities: Previous back surgery (L2/3, 3/4, 4/5 fusion); osteoporosis, CAD, chronic systolic heart failure, Afib, history bil foot fractures  are also affecting patient's functional outcome.   REHAB POTENTIAL: Good  CLINICAL DECISION MAKING: Evolving/moderate complexity  EVALUATION COMPLEXITY: Moderate   GOALS: Goals reviewed with patient? Yes  SHORT TERM GOALS: Target date: 03/26/2023   Patient will be independent with initial HEP.  Baseline: TBD Goal status: MET 04/03/23   LONG TERM GOALS: Target date: 05/21/2023   Patient will be independent with advanced/ongoing HEP to improve outcomes and carryover.  Baseline:  Goal status: IN PROGRESS 04/03/23 updated  2.  Patient will report 75% improvement in low back/sacral pain to improve QOL.  Baseline: constant dull ache Goal status: MET 04/26/23  3.  Patient will demonstrate improved functional strength as demonstrated by 5x STS < 15 sec to decrease fall risk. Baseline: 19.5 sec with bil UE assist Goal status: MET- 04/26/23  4.  Patient will report at least 6 points improvement on modified Oswestry to demonstrate improved functional ability.  Baseline: 19/50 Goal status: IN PROGRESS 05/01/23 16/50  5.  Patient will score at least 19/30 on FGA to decrease risk of falls with injury. Baseline: 12/30 Goal status: IN PROGRESS- 04/19/23 17/30  PLAN:  PT FREQUENCY: 2x/week  PT DURATION: 10 weeks  PLANNED INTERVENTIONS: 97110-Therapeutic exercises, 97530-  Therapeutic activity, 97112- Neuromuscular re-education, 97535- Self Care, 16109- Manual therapy, 97116- Gait training, 97014- Electrical stimulation (unattended), [phone]- Electrical stimulation (manual), [id number]- Ultrasound, Patient/Family education, Balance training, Stair training, Taping, Dry Needling, Joint mobilization, Joint manipulation, Spinal mobilization, Vestibular training, Cryotherapy, and Moist heat.  PLAN FOR NEXT SESSION: focus on higher level balance training.   Jena Gauss, PT 05/08/2023, 1:04 PM
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
## 2023-05-10 ENCOUNTER — Other Ambulatory Visit: Payer: Self-pay | Admitting: Student

## 2023-05-10 ENCOUNTER — Ambulatory Visit: Payer: 59

## 2023-05-10 DIAGNOSIS — R2681 Unsteadiness on feet: Secondary | ICD-10-CM

## 2023-05-10 DIAGNOSIS — R29898 Other symptoms and signs involving the musculoskeletal system: Secondary | ICD-10-CM

## 2023-05-10 DIAGNOSIS — M5459 Other low back pain: Secondary | ICD-10-CM | POA: Diagnosis not present

## 2023-05-10 DIAGNOSIS — M5416 Radiculopathy, lumbar region: Secondary | ICD-10-CM

## 2023-05-10 NOTE — Therapy (Signed)
 OUTPATIENT PHYSICAL THERAPY TREATMENT   Patient Name: ANAHY ESH MRN: [id number] DOB:Nov 01, 1960, 63 y.o., female Today's Date: 05/10/2023  END OF SESSION:  PT End of Session - 05/10/23 1254     Visit Number 14    Date for PT Re-Evaluation 05/21/23    Authorization Type [employer]    Progress Note Due on Visit 10    PT Start Time 1152   pt late   PT Stop Time 1231    PT Time Calculation (min) 39 min    Activity Tolerance Patient tolerated treatment well    Behavior During Therapy WFL for tasks assessed/performed                     Past Medical History:  Diagnosis Date   Atrial fibrillation    a. admx with AFib with RVR and acute systolic CHF 07/2013; TEE with LAA clot and no DCCV done;     Bilateral bunions 12/27/2020   Capsulitis of metatarsophalangeal (MTP) joint of right foot 01/31/2021   Chronic systolic heart failure    Coronary artery disease    a.  LHC (07/17/13):  LM 20%, ostial CFX 20-30%   Degenerative disc disease, lumbar 10/30/2017   Degenerative scoliosis 10/30/2017   Essential hypertension 09/10/2020   Fatigue 04/13/2014   Generalized anxiety disorder 09/22/2019   History of asthma 07/21/2013   History of cervical dysplasia 12/22/2021   History of kidney stones 2013   History of lumbar spinal fusion 01/16/2019   Hypercholesterolemia 09/22/2019   Hyperlipemia    Hypothyroidism    past hx   Leg weakness, bilateral 11/28/2017   Long term (current) use of anticoagulants 10/30/2017   Loss of bladder control 11/28/2017   Lumbar radiculopathy 11/28/2017   Major depressive disorder 09/22/2019   Moderate mitral regurgitation 07/17/2013   NICM (nonischemic cardiomyopathy)    a. Echo (07/17/13):  EF 25-30%, EF subsequently normalized with sinus rhythm (tachycardia mediated CM)   Osteopenia    Overweight 07/18/2019   Sinus bradycardia 08/28/2013   Spondylolisthesis at L4-L5 level 10/30/2017   Past Surgical History:  Procedure Laterality Date    ABDOMINAL HYSTERECTOMY  ~ 2001   ABLATION  10/28/13   PVI by Dr Johney Frame   APPENDECTOMY  1978   ATRIAL FIBRILLATION ABLATION N/A 10/28/2013   Procedure: ATRIAL FIBRILLATION ABLATION;  Surgeon: Gardiner Rhyme, MD;  Location: [facility] CATH LAB;  Service: Cardiovascular;  Laterality: N/A;   ATRIAL FIBRILLATION ABLATION N/A 11/23/2020   Procedure: ATRIAL FIBRILLATION ABLATION;  Surgeon: Hillis Range, MD;  Location: [facility] INVASIVE CV LAB;  Service: Cardiovascular;  Laterality: N/A;   BACK SURGERY Bilateral 2019   Fusion   CARDIAC CATHETERIZATION  07/2013   CARDIOVERSION N/A 08/18/2013   Procedure: CARDIOVERSION;  Surgeon: Donato Schultz, MD;  Location: [facility] ENDOSCOPY;  Service: Cardiovascular;  Laterality: N/A;   CARDIOVERSION N/A 08/29/2013   Procedure: CARDIOVERSION;  Surgeon: Pricilla Riffle, MD;  Location: [facility] OR;  Service: Cardiovascular;  Laterality: N/A;   ELECTROPHYSIOLOGIC STUDY N/A 11/05/2014   Procedure: Atrial Fibrillation Ablation;  Surgeon: Hillis Range, MD;  Location: [facility] INVASIVE CV LAB;  Service: Cardiovascular;  Laterality: N/A;   EYE SURGERY Bilateral 1999   Corrective laser surgery   implantable loop recorder removal  10/02/2018   MDT Reveal LINQ removed in office by Dr Johney Frame for EOL battery   IR SACROPLASTY BILATERAL  02/06/2019   LEFT HEART CATHETERIZATION WITH CORONARY ANGIOGRAM N/A 07/17/2013   Procedure: LEFT HEART CATHETERIZATION WITH CORONARY ANGIOGRAM;  Surgeon: Micheline Chapman, MD;  Location: [facility] CATH LAB;  Service: Cardiovascular;  Laterality: N/A;   LOOP RECORDER IMPLANT N/A 06/05/2014   Procedure: LOOP RECORDER IMPLANT;  Surgeon: Hillis Range, MD;  Location: [facility] CATH LAB;  Service: Cardiovascular;  Laterality: N/A;   TEE WITHOUT CARDIOVERSION N/A 07/18/2013   Procedure: TRANSESOPHAGEAL ECHOCARDIOGRAM (TEE);  Surgeon: Lewayne Bunting, MD;  Location: [facility] ENDOSCOPY;  Service: Cardiovascular;  Laterality: N/A;   TEE WITHOUT CARDIOVERSION N/A 08/18/2013   Procedure: TRANSESOPHAGEAL ECHOCARDIOGRAM  (TEE);  Surgeon: Donato Schultz, MD;  Location: [facility] ENDOSCOPY;  Service: Cardiovascular;  Laterality: N/A;   TEE WITHOUT CARDIOVERSION N/A 10/27/2013   Procedure: TRANSESOPHAGEAL ECHOCARDIOGRAM (TEE);  Surgeon: Pricilla Riffle, MD;  Location: [facility] ENDOSCOPY;  Service: Cardiovascular;  Laterality: N/A;   TEE WITHOUT CARDIOVERSION N/A 11/04/2014   Procedure: TRANSESOPHAGEAL ECHOCARDIOGRAM (TEE);  Surgeon: Chrystie Nose, MD;  Location: [facility] ENDOSCOPY;  Service: Cardiovascular;  Laterality: N/A;   Patient Active Problem List   Diagnosis Date Noted   Scoliosis, or kyphoscoliosis, idiopathic 11/09/2022   Osteoporosis 11/09/2022   Primary osteoarthritis of right hip 01/31/2022   Greater trochanteric bursitis of right hip 01/31/2022   History of cervical dysplasia    Capsulitis of metatarsophalangeal (MTP) joint of right foot 01/31/2021   Bilateral bunions 12/27/2020   Essential hypertension 09/10/2020   Generalized anxiety disorder 09/22/2019   Hypercholesterolemia 09/22/2019   Major depressive disorder 09/22/2019   Overweight 07/18/2019   Fracture of sacrum (HCC) 01/30/2019   History of lumbar spinal fusion 01/16/2019   Leg weakness, bilateral 11/28/2017   Loss of bladder control 11/28/2017   Lumbar radiculopathy 11/28/2017   Spinal stenosis at L4-L5 level 11/28/2017   Degenerative disc disease, lumbar 10/30/2017   Degenerative scoliosis 10/30/2017   Long term (current) use of anticoagulants 10/30/2017   NICM (nonischemic cardiomyopathy) 10/30/2017   Spondylolisthesis at L4-L5 level 10/30/2017   Fatigue 04/13/2014   Sinus bradycardia 08/28/2013   Coronary artery disease    Chronic systolic heart failure    Atrial fibrillation    History of asthma 07/21/2013   Moderate mitral regurgitation 07/17/2013    PCP: [name], MD   REFERRING PROVIDER: Sherryl Manges*   REFERRING DIAG:  [phone] (ICD-10-CM) - Spondylolisthesis of lumbar region  [phone] (ICD-10-CM) - Status post lumbar  spine operation    Rationale for Evaluation and Treatment: Rehabilitation  THERAPY DIAG:  Other low back pain  Unsteadiness on feet  Left lumbar radiculopathy  Unsteady gait when walking  Weakness of both hips  ONSET DATE: Jan 15, 2023 Fusion L5/S1  SUBJECTIVE:  SUBJECTIVE STATEMENT: Pt reports no new complaints today.  PERTINENT HISTORY:  01/15/23 Fusion L5/S1 with hardware removal.  Previous back surgery (L2/3, 3/4, 4/5 fusion); osteoporosis, CAD, chronic systolic heart failure, Afib, history bil foot fractures.  Sacroplasty.   PAIN:  Are you having pain? Yes: NPRS scale: 1/10 Pain location: sacrum Pain description: dull ache Aggravating factors: sitting too long, sitting on hard surfaces, sitting without back support  Relieving factors: cushions, tylenol  PRECAUTIONS: Fall  RED FLAGS: None   WEIGHT BEARING RESTRICTIONS: No  FALLS:  Has patient fallen in last 6 months? No but feels very unsteady.    LIVING ENVIRONMENT: Lives with: lives with their spouse Lives in: House/apartment Stairs: No Has following equipment at home: Dan Humphreys - 2 wheeled  OCCUPATION: disabled  PLOF: Independent  PATIENT GOALS: regain my balance, hopefully improve pain  NEXT MD VISIT: March 2025  OBJECTIVE:   DIAGNOSTIC FINDINGS:  12/07/2022 CT Lumbar Spine (PRE-OP) IMPRESSION: 1. Previous posterior decompression and discectomy at L2-3. Right-sided L3 pedicle screw does breach the superior endplate and enter the lateral disc space. No evidence of motion of either of the L3 screws. There is mild lucency and surrounding sclerosis at the left L2 screw. Possibility of mild screw motion does exist, but this does not appear pronounced. 2. Previous posterior decompression, diskectomy and fusion at  L3-4 and L4-5. No evidence of motion or hardware failure. 3. L5-S1: Bulging of the disc. Bilateral facet arthropathy worse on the left than the right. Left foraminal stenosis primarily due to osteophytic encroachment from facet arthropathy in combination with endplate osteophytes and bulging disc that could affect the exiting left L5 nerve.  PATIENT SURVEYS:  Modified Oswestry 19/50   COGNITION: Overall cognitive status: Within functional limits for tasks assessed     SENSATION: Diminished to light touch L S1 dermatome.  Reports tingling in L foot still after surgery.   MUSCLE LENGTH: NT  POSTURE:  decreased cervical lordosis  PALPATION: NA  LUMBAR ROM:   AROM eval  Flexion   Extension   Right lateral flexion   Left lateral flexion   Right rotation   Left rotation    (Blank rows = not tested)  LOWER EXTREMITY ROM:      NT today  LOWER EXTREMITY MMT:    MMT Right* eval Left* eval  Hip flexion 4+ 4  Hip extension    Hip abduction 5 5  Hip adduction 5 5  Knee flexion 5 5  Knee extension 5 5  Ankle dorsiflexion 5 5  Ankle plantarflexion 5 WB 5 WB   (Blank rows = not tested) * tested in sitting  FUNCTIONAL TESTS:  5 times sit to stand: 19.5 sec with bil UE assist (needed for eccentric control due to sacral pain) Functional gait assessment: 12/30 MCTSIB: Condition 1: Avg of 3 trials: 30 sec, Condition 2: Avg of 3 trials: 30 sec, Condition 3: Avg of 3 trials: 30 sec, Condition 4: Avg of 3 trials: 30 sec, and Total Score: 120/120   GAIT: Distance walked: 200' Assistive device utilized: None Level of assistance: Complete Independence Comments: occasionally unsteady, visually slow gait speed.   TODAY'S TREATMENT:  DATE: 05/10/23  [facility] PT Assessment - 05/10/23 0001       Functional Gait  Assessment   Gait Level Surface Walks 20 ft in  less than 7 sec but greater than 5.5 sec, uses assistive device, slower speed, mild gait deviations, or deviates 6-10 in outside of the 12 in walkway width.    Change in Gait Speed Able to smoothly change walking speed without loss of balance or gait deviation. Deviate no more than 6 in outside of the 12 in walkway width.    Gait with Horizontal Head Turns Performs head turns smoothly with slight change in gait velocity (eg, minor disruption to smooth gait path), deviates 6-10 in outside 12 in walkway width, or uses an assistive device.    Gait with Vertical Head Turns Performs task with slight change in gait velocity (eg, minor disruption to smooth gait path), deviates 6 - 10 in outside 12 in walkway width or uses assistive device    Gait and Pivot Turn Pivot turns safely within 3 sec and stops quickly with no loss of balance.    Step Over Obstacle Is able to step over 2 stacked shoe boxes taped together (9 in total height) without changing gait speed. No evidence of imbalance.    Gait with Narrow Base of Support Ambulates 7-9 steps.    Gait with Eyes Closed Walks 20 ft, uses assistive device, slower speed, mild gait deviations, deviates 6-10 in outside 12 in walkway width. Ambulates 20 ft in less than 9 sec but greater than 7 sec.    Ambulating Backwards Walks 20 ft, uses assistive device, slower speed, mild gait deviations, deviates 6-10 in outside 12 in walkway width.    Steps Alternating feet, must use rail.    Total Score 23           Leg press 25lb 2x10 BLE Knee flexion 25lb 2x10  Knee extension 15lb 2x10 Standing shoulder ext 10lb 2x10  05/08/23 Neuromuscular Reeducation: for balance, coordination and proprioception.  In hallway with SBA for safety.  Gait X 600' for warm-up Gait with head turns x 150' Gait with head nods x 150' Tandem walking x 50' Reverse tandem walking x 50' Cross steps x 50' each direction Braiding x 50' each direction Star excursion pattern taps 4 directions  x 5 - minA to steady Therapeutic Activity:   Safety with rebounder exercises and modifications (plank on wall v. On rebounder, weight shifts rather than jumping, video link given and key words)    05/03/23 Recumbent bike L3x25min NEUROMUSCULAR RE-EDUCATION: To improve proprioception, balance, and kinesthesia. In hallway: - Tandem gait - walking with horiz and vert head turns - walking w/ horiz head turns counting up by two's - Walking with quick reactions- stopping, turning around, retro gait - braiding both ways - gait while searching for colored cones hidden around the clinic  05/01/23  Therapeutic Activity:  reviewing goals and concerns Bike L2 x 10 min for warm up while completing  modified Oswestry  Neuromuscular Reeducation: for balance, coordination and proprioception, SBA for safety In hallway- Tandem gait Retro tandem gait Gait with ball toss Side stepping with ball toss Review of HEP for corner balance exercises and update     04/26/23 Walking outside on uneven terrain (grass, hay, humps and hills in grass) CGA by therapist  5xSTS- 13 seconds Seated series of R/L arm raise, then R/L trunk rotation 5x 5 reps each for core activation 04/24/2023  Therapeutic Exercise: to improve strength and mobility.  Demo, verbal and tactile cues throughout for technique. Bike x 5 min Forward RDLS x 10 each side Neuromuscular Reeducation: for balance, coordination and proprioception, In corner for safety, On level surface Eyes open feet together x 30 sec  Eyes open feet together with head nods x 10 Eyes open feet together with head turns x 10 Eyes closed feet together x 30 sec Eyes closed feet together with head nods x 10 Eyes closed feet together with head turns x 10  In corner for safety, On airex Eyes open feet apart x 30 sec  Eyes open feet apart with head nods x 10 Eyes open feet apart with head turns x 10 Eyes closed feet apart x 30 sec Eyes closed feet apart with head nods x  10 Eyes closed feet apart with head turns x 10  With CGA: Star excursion pattern: Forward, side, back, cross step x 10 each side    PATIENT EDUCATION:  Education details: education on rebounding on modifications for safety Person educated: Patient Education method: Explanation, Demonstration, Verbal cues, and Handouts Education comprehension: verbalized understanding  HOME EXERCISE PROGRAM: Access Code: [email or web address] URL: [email or web address] Date: 05/01/2023 Prepared by: Harrie Foreman  Exercises - Supine Posterior Pelvic Tilt  - 1 x daily - 7 x weekly - 2 sets - 10 reps - Supine Bridge with Resistance Band  - 1 x daily - 7 x weekly - 3 sets - 10 reps - Clamshell with Resistance  - 1 x daily - 7 x weekly - 3 sets - 10 reps - Standing Gastroc Stretch on Foam 1/2 Roll  - 1 x daily - 7 x weekly - 1 sets - 3 reps - 30 hold - Standing Gastroc Stretch  - 1 x daily - 7 x weekly - 1 sets - 3 reps - 30 sec hold - Standing Tandem Balance with Counter Support  - 1 x daily - 7 x weekly - 1 sets - 3 reps - 30 sec hold - Standing Single Leg Stance with Counter Support  - 1 x daily - 7 x weekly - 1 sets - 3 reps - 30 sec  hold - Corner Balance   - 3 x daily - 7 x weekly - Tandem Walking  - 1 x daily - 7 x weekly - 3 sets - 10 reps  ASSESSMENT:  CLINICAL IMPRESSION: Balance is improving per FGA score meeting LTG #5. She requested to do weight machines so we focused on this for strengthening. Will continue working on higher level balance.  Sharlot Gowda continues to demonstrate potential for improvement and would benefit from continued skilled therapy to address impairments.       OBJECTIVE IMPAIRMENTS: Abnormal gait, decreased activity tolerance, decreased balance, decreased endurance, difficulty walking, decreased strength, impaired perceived functional ability, and pain.   ACTIVITY LIMITATIONS: carrying, lifting, bending, standing, squatting, sleeping, transfers, and  locomotion level  PARTICIPATION LIMITATIONS: meal prep, cleaning, laundry, shopping, community activity, and yard work  PERSONAL FACTORS: Behavior pattern, Fitness, Past/current experiences, and 3+ comorbidities: Previous back surgery (L2/3, 3/4, 4/5 fusion); osteoporosis, CAD, chronic systolic heart failure, Afib, history bil foot fractures  are also affecting patient's functional outcome.   REHAB POTENTIAL: Good  CLINICAL DECISION MAKING: Evolving/moderate complexity  EVALUATION COMPLEXITY: Moderate   GOALS: Goals reviewed with patient? Yes  SHORT TERM GOALS: Target date: 03/26/2023   Patient will be independent with initial HEP.  Baseline: TBD Goal status: MET 04/03/23   LONG TERM GOALS: Target date: 05/21/2023   Patient  will be independent with advanced/ongoing HEP to improve outcomes and carryover.  Baseline:  Goal status: IN PROGRESS 04/03/23 updated  2.  Patient will report 75% improvement in low back/sacral pain to improve QOL.  Baseline: constant dull ache Goal status: MET 04/26/23  3.  Patient will demonstrate improved functional strength as demonstrated by 5x STS < 15 sec to decrease fall risk. Baseline: 19.5 sec with bil UE assist Goal status: MET- 04/26/23  4.  Patient will report at least 6 points improvement on modified Oswestry to demonstrate improved functional ability.  Baseline: 19/50 Goal status: IN PROGRESS 05/01/23 16/50  5.  Patient will score at least 19/30 on FGA to decrease risk of falls with injury. Baseline: 12/30 Goal status: MET- 05/10/23  PLAN:  PT FREQUENCY: 2x/week  PT DURATION: 10 weeks  PLANNED INTERVENTIONS: 97110-Therapeutic exercises, 97530- Therapeutic activity, 97112- Neuromuscular re-education, 97535- Self Care, 16109- Manual therapy, [phone]- Gait training, 97014- Electrical stimulation (unattended), [phone]- Electrical stimulation (manual), [id number]- Ultrasound, Patient/Family education, Balance training, Stair training, Taping, Dry  Needling, Joint mobilization, Joint manipulation, Spinal mobilization, Vestibular training, Cryotherapy, and Moist heat.  PLAN FOR NEXT SESSION: focus on higher level balance training.   Darleene Cleaver, PTA 05/10/2023, 12:55 PM

## 2023-05-11 MED ORDER — POTASSIUM CHLORIDE ER 10 MEQ PO TBCR: EXTENDED_RELEASE_TABLET | ORAL | 2 refills | Status: AC

## 2023-05-11 NOTE — Telephone Encounter (Signed)
 This is a Educational psychologist pt

## 2023-05-15 ENCOUNTER — Ambulatory Visit: Payer: 59 | Attending: Student

## 2023-05-15 DIAGNOSIS — R2681 Unsteadiness on feet: Secondary | ICD-10-CM

## 2023-05-15 DIAGNOSIS — M5416 Radiculopathy, lumbar region: Secondary | ICD-10-CM

## 2023-05-15 DIAGNOSIS — M5459 Other low back pain: Secondary | ICD-10-CM

## 2023-05-15 DIAGNOSIS — R29898 Other symptoms and signs involving the musculoskeletal system: Secondary | ICD-10-CM | POA: Diagnosis present

## 2023-05-15 NOTE — Therapy (Signed)
 OUTPATIENT PHYSICAL THERAPY TREATMENT   Patient Name: Kiara [name] MRN: [id number] DOB:02/09/1961, 63 y.o., female Today's Date: 05/15/2023  END OF SESSION:  PT End of Session - 05/15/23 1244     Visit Number 15    Date for PT Re-Evaluation 05/21/23    Authorization Type [employer]    Progress Note Due on Visit 10    PT Start Time 1151    PT Stop Time 1232    PT Time Calculation (min) 41 min    Activity Tolerance Patient tolerated treatment well    Behavior During Therapy [facility] for tasks assessed/performed                      Past Medical History:  Diagnosis Date   Atrial fibrillation    a. admx with AFib with RVR and acute systolic CHF 07/2013; TEE with LAA clot and no DCCV done;     Bilateral bunions 12/27/2020   Capsulitis of metatarsophalangeal (MTP) joint of right foot 01/31/2021   Chronic systolic heart failure    Coronary artery disease    a.  LHC (07/17/13):  LM 20%, ostial CFX 20-30%   Degenerative disc disease, lumbar 10/30/2017   Degenerative scoliosis 10/30/2017   Essential hypertension 09/10/2020   Fatigue 04/13/2014   Generalized anxiety disorder 09/22/2019   History of asthma 07/21/2013   History of cervical dysplasia 12/22/2021   History of kidney stones 2013   History of lumbar spinal fusion 01/16/2019   Hypercholesterolemia 09/22/2019   Hyperlipemia    Hypothyroidism    past hx   Leg weakness, bilateral 11/28/2017   Long term (current) use of anticoagulants 10/30/2017   Loss of bladder control 11/28/2017   Lumbar radiculopathy 11/28/2017   Major depressive disorder 09/22/2019   Moderate mitral regurgitation 07/17/2013   NICM (nonischemic cardiomyopathy)    a. Echo (07/17/13):  EF 25-30%, EF subsequently normalized with sinus rhythm (tachycardia mediated CM)   Osteopenia    Overweight 07/18/2019   Sinus bradycardia 08/28/2013   Spondylolisthesis at L4-L5 level 10/30/2017   Past Surgical History:  Procedure Laterality Date   ABDOMINAL  HYSTERECTOMY  ~ 2001   ABLATION  10/28/13   PVI by Dr Johney Frame   APPENDECTOMY  1978   ATRIAL FIBRILLATION ABLATION N/A 10/28/2013   Procedure: ATRIAL FIBRILLATION ABLATION;  Surgeon: Gardiner Rhyme, MD;  Location: [facility] CATH LAB;  Service: Cardiovascular;  Laterality: N/A;   ATRIAL FIBRILLATION ABLATION N/A 11/23/2020   Procedure: ATRIAL FIBRILLATION ABLATION;  Surgeon: Hillis Range, MD;  Location: [facility] INVASIVE CV LAB;  Service: Cardiovascular;  Laterality: N/A;   BACK SURGERY Bilateral 2019   Fusion   CARDIAC CATHETERIZATION  07/2013   CARDIOVERSION N/A 08/18/2013   Procedure: CARDIOVERSION;  Surgeon: Donato Schultz, MD;  Location: [facility] ENDOSCOPY;  Service: Cardiovascular;  Laterality: N/A;   CARDIOVERSION N/A 08/29/2013   Procedure: CARDIOVERSION;  Surgeon: Pricilla Riffle, MD;  Location[facility] OR;  Service: Cardiovascular;  Laterality: N/A;   ELECTROPHYSIOLOGIC STUDY N/A 11/05/2014   Procedure: Atrial Fibrillation Ablation;  Surgeon: Hillis Range, MD;  Location: [facility] INVASIVE CV LAB;  Service: Cardiovascular;  Laterality: N/A;   EYE SURGERY Bilateral 1999   Corrective laser surgery   implantable loop recorder removal  10/02/2018   MDT Reveal LINQ removed in office by Dr Johney Frame for EOL battery   IR SACROPLASTY BILATERAL  02/06/2019   LEFT HEART CATHETERIZATION WITH CORONARY ANGIOGRAM N/A 07/17/2013   Procedure: LEFT HEART CATHETERIZATION WITH CORONARY ANGIOGRAM;  Surgeon:  Micheline Chapman, MD;  Location: [facility] CATH LAB;  Service: Cardiovascular;  Laterality: N/A;   LOOP RECORDER IMPLANT N/A 06/05/2014   Procedure: LOOP RECORDER IMPLANT;  Surgeon: Hillis Range, MD;  Location: [facility] CATH LAB;  Service: Cardiovascular;  Laterality: N/A;   TEE WITHOUT CARDIOVERSION N/A 07/18/2013   Procedure: TRANSESOPHAGEAL ECHOCARDIOGRAM (TEE);  Surgeon: Lewayne Bunting, MD;  Location: [facility] ENDOSCOPY;  Service: Cardiovascular;  Laterality: N/A;   TEE WITHOUT CARDIOVERSION N/A 08/18/2013   Procedure: TRANSESOPHAGEAL ECHOCARDIOGRAM (TEE);   Surgeon: Donato Schultz, MD;  Location: [facility] ENDOSCOPY;  Service: Cardiovascular;  Laterality: N/A;   TEE WITHOUT CARDIOVERSION N/A 10/27/2013   Procedure: TRANSESOPHAGEAL ECHOCARDIOGRAM (TEE);  Surgeon: Pricilla Riffle, MD;  Location: [facility] ENDOSCOPY;  Service: Cardiovascular;  Laterality: N/A;   TEE WITHOUT CARDIOVERSION N/A 11/04/2014   Procedure: TRANSESOPHAGEAL ECHOCARDIOGRAM (TEE);  Surgeon: Chrystie Nose, MD;  Location: [facility] ENDOSCOPY;  Service: Cardiovascular;  Laterality: N/A;   Patient Active Problem List   Diagnosis Date Noted   Scoliosis, or kyphoscoliosis, idiopathic 11/09/2022   Osteoporosis 11/09/2022   Primary osteoarthritis of right hip 01/31/2022   Greater trochanteric bursitis of right hip 01/31/2022   History of cervical dysplasia    Capsulitis of metatarsophalangeal (MTP) joint of right foot 01/31/2021   Bilateral bunions 12/27/2020   Essential hypertension 09/10/2020   Generalized anxiety disorder 09/22/2019   Hypercholesterolemia 09/22/2019   Major depressive disorder 09/22/2019   Overweight 07/18/2019   Fracture of sacrum (HCC) 01/30/2019   History of lumbar spinal fusion 01/16/2019   Leg weakness, bilateral 11/28/2017   Loss of bladder control 11/28/2017   Lumbar radiculopathy 11/28/2017   Spinal stenosis at L4-L5 level 11/28/2017   Degenerative disc disease, lumbar 10/30/2017   Degenerative scoliosis 10/30/2017   Long term (current) use of anticoagulants 10/30/2017   NICM (nonischemic cardiomyopathy) 10/30/2017   Spondylolisthesis at L4-L5 level 10/30/2017   Fatigue 04/13/2014   Sinus bradycardia 08/28/2013   Coronary artery disease    Chronic systolic heart failure    Atrial fibrillation    History of asthma 07/21/2013   Moderate mitral regurgitation 07/17/2013    PCP: [name], MD   REFERRING PROVIDER: Sherryl Manges*   REFERRING DIAG:  [phone] (ICD-10-CM) - Spondylolisthesis of lumbar region  [phone] (ICD-10-CM) - Status post lumbar spine  operation    Rationale for Evaluation and Treatment: Rehabilitation  THERAPY DIAG:  Other low back pain  Unsteadiness on feet  Left lumbar radiculopathy  Unsteady gait when walking  Weakness of both hips  ONSET DATE: Jan 15, 2023 Fusion L5/S1  SUBJECTIVE:  SUBJECTIVE STATEMENT: " Worked hard yesterday emptying out clothes yesterday, sore today."  PERTINENT HISTORY:  01/15/23 Fusion L5/S1 with hardware removal.  Previous back surgery (L2/3, 3/4, 4/5 fusion); osteoporosis, CAD, chronic systolic heart failure, Afib, history bil foot fractures.  Sacroplasty.   PAIN:  Are you having pain? Yes: NPRS scale: 1/10 Pain location: sacrum Pain description: dull ache Aggravating factors: sitting too long, sitting on hard surfaces, sitting without back support  Relieving factors: cushions, tylenol  PRECAUTIONS: Fall  RED FLAGS: None   WEIGHT BEARING RESTRICTIONS: No  FALLS:  Has patient fallen in last 6 months? No but feels very unsteady.    LIVING ENVIRONMENT: Lives with: lives with their spouse Lives in: House/apartment Stairs: No Has following equipment at home: Dan Humphreys - 2 wheeled  OCCUPATION: disabled  PLOF: Independent  PATIENT GOALS: regain my balance, hopefully improve pain  NEXT MD VISIT: March 2025  OBJECTIVE:   DIAGNOSTIC FINDINGS:  12/07/2022 CT Lumbar Spine (PRE-OP) IMPRESSION: 1. Previous posterior decompression and discectomy at L2-3. Right-sided L3 pedicle screw does breach the superior endplate and enter the lateral disc space. No evidence of motion of either of the L3 screws. There is mild lucency and surrounding sclerosis at the left L2 screw. Possibility of mild screw motion does exist, but this does not appear pronounced. 2. Previous posterior decompression,  diskectomy and fusion at L3-4 and L4-5. No evidence of motion or hardware failure. 3. L5-S1: Bulging of the disc. Bilateral facet arthropathy worse on the left than the right. Left foraminal stenosis primarily due to osteophytic encroachment from facet arthropathy in combination with endplate osteophytes and bulging disc that could affect the exiting left L5 nerve.  PATIENT SURVEYS:  Modified Oswestry 19/50   COGNITION: Overall cognitive status: Within functional limits for tasks assessed     SENSATION: Diminished to light touch L S1 dermatome.  Reports tingling in L foot still after surgery.   MUSCLE LENGTH: NT  POSTURE:  decreased cervical lordosis  PALPATION: NA  LUMBAR ROM:   AROM eval  Flexion   Extension   Right lateral flexion   Left lateral flexion   Right rotation   Left rotation    (Blank rows = not tested)  LOWER EXTREMITY ROM:      NT today  LOWER EXTREMITY MMT:    MMT Right* eval Left* eval  Hip flexion 4+ 4  Hip extension    Hip abduction 5 5  Hip adduction 5 5  Knee flexion 5 5  Knee extension 5 5  Ankle dorsiflexion 5 5  Ankle plantarflexion 5 WB 5 WB   (Blank rows = not tested) * tested in sitting  FUNCTIONAL TESTS:  5 times sit to stand: 19.5 sec with bil UE assist (needed for eccentric control due to sacral pain) Functional gait assessment: 12/30 MCTSIB: Condition 1: Avg of 3 trials: 30 sec, Condition 2: Avg of 3 trials: 30 sec, Condition 3: Avg of 3 trials: 30 sec, Condition 4: Avg of 3 trials: 30 sec, and Total Score: 120/120   GAIT: Distance walked: 200' Assistive device utilized: None Level of assistance: Complete Independence Comments: occasionally unsteady, visually slow gait speed.   TODAY'S TREATMENT:  DATE: 05/15/23 Nustep L5x53min Leg press 25lb 2x15 Seated rows 25lb 2x15 Knee flexion 25lb  2x15 Knee extension 15lb 2x15  05/10/23  [facility] PT Assessment - 05/10/23 0001       Functional Gait  Assessment   Gait Level Surface Walks 20 ft in less than 7 sec but greater than 5.5 sec, uses assistive device, slower speed, mild gait deviations, or deviates 6-10 in outside of the 12 in walkway width.    Change in Gait Speed Able to smoothly change walking speed without loss of balance or gait deviation. Deviate no more than 6 in outside of the 12 in walkway width.    Gait with Horizontal Head Turns Performs head turns smoothly with slight change in gait velocity (eg, minor disruption to smooth gait path), deviates 6-10 in outside 12 in walkway width, or uses an assistive device.    Gait with Vertical Head Turns Performs task with slight change in gait velocity (eg, minor disruption to smooth gait path), deviates 6 - 10 in outside 12 in walkway width or uses assistive device    Gait and Pivot Turn Pivot turns safely within 3 sec and stops quickly with no loss of balance.    Step Over Obstacle Is able to step over 2 stacked shoe boxes taped together (9 in total height) without changing gait speed. No evidence of imbalance.    Gait with Narrow Base of Support Ambulates 7-9 steps.    Gait with Eyes Closed Walks 20 ft, uses assistive device, slower speed, mild gait deviations, deviates 6-10 in outside 12 in walkway width. Ambulates 20 ft in less than 9 sec but greater than 7 sec.    Ambulating Backwards Walks 20 ft, uses assistive device, slower speed, mild gait deviations, deviates 6-10 in outside 12 in walkway width.    Steps Alternating feet, must use rail.    Total Score 23           Leg press 25lb 2x10 BLE Knee flexion 25lb 2x10  Knee extension 15lb 2x10 Standing shoulder ext 10lb 2x10  05/08/23 Neuromuscular Reeducation: for balance, coordination and proprioception.  In hallway with SBA for safety.  Gait X 600' for warm-up Gait with head turns x 150' Gait with head nods x  150' Tandem walking x 50' Reverse tandem walking x 50' Cross steps x 50' each direction Braiding x 50' each direction Star excursion pattern taps 4 directions x 5 - minA to steady Therapeutic Activity:   Safety with rebounder exercises and modifications (plank on wall v. On rebounder, weight shifts rather than jumping, video link given and key words)    05/03/23 Recumbent bike L3x53min NEUROMUSCULAR RE-EDUCATION: To improve proprioception, balance, and kinesthesia. In hallway: - Tandem gait - walking with horiz and vert head turns - walking w/ horiz head turns counting up by two's - Walking with quick reactions- stopping, turning around, retro gait - braiding both ways - gait while searching for colored cones hidden around the clinic  05/01/23  Therapeutic Activity:  reviewing goals and concerns Bike L2 x 10 min for warm up while completing  modified Oswestry  Neuromuscular Reeducation: for balance, coordination and proprioception, SBA for safety In hallway- Tandem gait Retro tandem gait Gait with ball toss Side stepping with ball toss Review of HEP for corner balance exercises and update     04/26/23 Walking outside on uneven terrain (grass, hay, humps and hills in grass) CGA by therapist  5xSTS- 13 seconds Seated series of R/L arm raise,  then R/L trunk rotation 5x 5 reps each for core activation 04/24/2023  Therapeutic Exercise: to improve strength and mobility.  Demo, verbal and tactile cues throughout for technique. Bike x 5 min Forward RDLS x 10 each side Neuromuscular Reeducation: for balance, coordination and proprioception, In corner for safety, On level surface Eyes open feet together x 30 sec  Eyes open feet together with head nods x 10 Eyes open feet together with head turns x 10 Eyes closed feet together x 30 sec Eyes closed feet together with head nods x 10 Eyes closed feet together with head turns x 10  In corner for safety, On airex Eyes open feet apart x 30  sec  Eyes open feet apart with head nods x 10 Eyes open feet apart with head turns x 10 Eyes closed feet apart x 30 sec Eyes closed feet apart with head nods x 10 Eyes closed feet apart with head turns x 10  With CGA: Star excursion pattern: Forward, side, back, cross step x 10 each side    PATIENT EDUCATION:  Education details: education on rebounding on modifications for safety Person educated: Patient Education method: Explanation, Demonstration, Verbal cues, and Handouts Education comprehension: verbalized understanding  HOME EXERCISE PROGRAM: Access Code: [email or web address] URL: [email or web address] Date: 05/01/2023 Prepared by: Harrie Foreman  Exercises - Supine Posterior Pelvic Tilt  - 1 x daily - 7 x weekly - 2 sets - 10 reps - Supine Bridge with Resistance Band  - 1 x daily - 7 x weekly - 3 sets - 10 reps - Clamshell with Resistance  - 1 x daily - 7 x weekly - 3 sets - 10 reps - Standing Gastroc Stretch on Foam 1/2 Roll  - 1 x daily - 7 x weekly - 1 sets - 3 reps - 30 hold - Standing Gastroc Stretch  - 1 x daily - 7 x weekly - 1 sets - 3 reps - 30 sec hold - Standing Tandem Balance with Counter Support  - 1 x daily - 7 x weekly - 1 sets - 3 reps - 30 sec hold - Standing Single Leg Stance with Counter Support  - 1 x daily - 7 x weekly - 1 sets - 3 reps - 30 sec  hold - Corner Balance   - 3 x daily - 7 x weekly - Tandem Walking  - 1 x daily - 7 x weekly - 3 sets - 10 reps  ASSESSMENT:  CLINICAL IMPRESSION: Continued with machine strengthening to for LE strength and postural strength. Reviewed gym machines best for use at gym since she is planning on D/C. Reviewed the correct set-up and positioning for each machine as needed.  Kiara [name] continues to demonstrate potential for improvement and would benefit from continued skilled therapy to address impairments.       OBJECTIVE IMPAIRMENTS: Abnormal gait, decreased activity tolerance, decreased balance,  decreased endurance, difficulty walking, decreased strength, impaired perceived functional ability, and pain.   ACTIVITY LIMITATIONS: carrying, lifting, bending, standing, squatting, sleeping, transfers, and locomotion level  PARTICIPATION LIMITATIONS: meal prep, cleaning, laundry, shopping, community activity, and yard work  PERSONAL FACTORS: Behavior pattern, Fitness, Past/current experiences, and 3+ comorbidities: Previous back surgery (L2/3, 3/4, 4/5 fusion); osteoporosis, CAD, chronic systolic heart failure, Afib, history bil foot fractures  are also affecting patient's functional outcome.   REHAB POTENTIAL: Good  CLINICAL DECISION MAKING: Evolving/moderate complexity  EVALUATION COMPLEXITY: Moderate   GOALS: Goals reviewed with patient? Yes  SHORT TERM GOALS: Target date:  03/26/2023   Patient will be independent with initial HEP.  Baseline: TBD Goal status: MET 04/03/23   LONG TERM GOALS: Target date: 05/21/2023   Patient will be independent with advanced/ongoing HEP to improve outcomes and carryover.  Baseline:  Goal status: IN PROGRESS 04/03/23 updated  2.  Patient will report 75% improvement in low back/sacral pain to improve QOL.  Baseline: constant dull ache Goal status: MET 04/26/23  3.  Patient will demonstrate improved functional strength as demonstrated by 5x STS < 15 sec to decrease fall risk. Baseline: 19.5 sec with bil UE assist Goal status: MET- 04/26/23  4.  Patient will report at least 6 points improvement on modified Oswestry to demonstrate improved functional ability.  Baseline: 19/50 Goal status: IN PROGRESS 05/01/23 16/50  5.  Patient will score at least 19/30 on FGA to decrease risk of falls with injury. Baseline: 12/30 Goal status: MET- 05/10/23  PLAN:  PT FREQUENCY: 2x/week  PT DURATION: 10 weeks  PLANNED INTERVENTIONS: 97110-Therapeutic exercises, 97530- Therapeutic activity, 97112- Neuromuscular re-education, 97535- Self Care, 44034- Manual  therapy, [phone]- Gait training, [id number]- Electrical stimulation (unattended), [phone]- Electrical stimulation (manual), [id number]- Ultrasound, Patient/Family education, Balance training, Stair training, Taping, Dry Needling, Joint mobilization, Joint manipulation, Spinal mobilization, Vestibular training, Cryotherapy, and Moist heat.  PLAN FOR NEXT SESSION: focus on higher level balance training.   Darleene Cleaver, PTA 05/15/2023, 12:48 PM

## 2023-05-17 ENCOUNTER — Ambulatory Visit: Payer: 59

## 2023-05-17 ENCOUNTER — Other Ambulatory Visit: Payer: Self-pay

## 2023-05-17 DIAGNOSIS — M5416 Radiculopathy, lumbar region: Secondary | ICD-10-CM

## 2023-05-17 DIAGNOSIS — M5459 Other low back pain: Secondary | ICD-10-CM

## 2023-05-17 DIAGNOSIS — R2681 Unsteadiness on feet: Secondary | ICD-10-CM

## 2023-05-17 DIAGNOSIS — R29898 Other symptoms and signs involving the musculoskeletal system: Secondary | ICD-10-CM

## 2023-05-17 NOTE — Therapy (Signed)
 OUTPATIENT PHYSICAL THERAPY TREATMENT   Patient Name: Kiara [name] MRN: [id number] DOB:07/26/1960, 63 y.o., female Today's Date: 05/17/2023  END OF SESSION:             Past Medical History:  Diagnosis Date   Atrial fibrillation    a. admx with AFib with RVR and acute systolic CHF 07/2013; TEE with LAA clot and no DCCV done;     Bilateral bunions 12/27/2020   Capsulitis of metatarsophalangeal (MTP) joint of right foot 01/31/2021   Chronic systolic heart failure    Coronary artery disease    a.  LHC (07/17/13):  LM 20%, ostial CFX 20-30%   Degenerative disc disease, lumbar 10/30/2017   Degenerative scoliosis 10/30/2017   Essential hypertension 09/10/2020   Fatigue 04/13/2014   Generalized anxiety disorder 09/22/2019   History of asthma 07/21/2013   History of cervical dysplasia 12/22/2021   History of kidney stones 2013   History of lumbar spinal fusion 01/16/2019   Hypercholesterolemia 09/22/2019   Hyperlipemia    Hypothyroidism    past hx   Leg weakness, bilateral 11/28/2017   Long term (current) use of anticoagulants 10/30/2017   Loss of bladder control 11/28/2017   Lumbar radiculopathy 11/28/2017   Major depressive disorder 09/22/2019   Moderate mitral regurgitation 07/17/2013   NICM (nonischemic cardiomyopathy)    a. Echo (07/17/13):  EF 25-30%, EF subsequently normalized with sinus rhythm (tachycardia mediated CM)   Osteopenia    Overweight 07/18/2019   Sinus bradycardia 08/28/2013   Spondylolisthesis at L4-L5 level 10/30/2017   Past Surgical History:  Procedure Laterality Date   ABDOMINAL HYSTERECTOMY  ~ 2001   ABLATION  10/28/13   PVI by Dr Johney Frame   APPENDECTOMY  1978   ATRIAL FIBRILLATION ABLATION N/A 10/28/2013   Procedure: ATRIAL FIBRILLATION ABLATION;  Surgeon: Gardiner Rhyme, MD;  Location: [facility] CATH LAB;  Service: Cardiovascular;  Laterality: N/A;   ATRIAL FIBRILLATION ABLATION N/A 11/23/2020   Procedure: ATRIAL FIBRILLATION ABLATION;  Surgeon:  Hillis Range, MD;  Location: [facility] INVASIVE CV LAB;  Service: Cardiovascular;  Laterality: N/A;   BACK SURGERY Bilateral 2019   Fusion   CARDIAC CATHETERIZATION  07/2013   CARDIOVERSION N/A 08/18/2013   Procedure: CARDIOVERSION;  Surgeon: Donato Schultz, MD;  Location: [facility] ENDOSCOPY;  Service: Cardiovascular;  Laterality: N/A;   CARDIOVERSION N/A 08/29/2013   Procedure: CARDIOVERSION;  Surgeon: Pricilla Riffle, MD;  Location: [facility] OR;  Service: Cardiovascular;  Laterality: N/A;   ELECTROPHYSIOLOGIC STUDY N/A 11/05/2014   Procedure: Atrial Fibrillation Ablation;  Surgeon: Hillis Range, MD;  Location: [facility] INVASIVE CV LAB;  Service: Cardiovascular;  Laterality: N/A;   EYE SURGERY Bilateral 1999   Corrective laser surgery   implantable loop recorder removal  10/02/2018   MDT Reveal LINQ removed in office by Dr Johney Frame for EOL battery   IR SACROPLASTY BILATERAL  02/06/2019   LEFT HEART CATHETERIZATION WITH CORONARY ANGIOGRAM N/A 07/17/2013   Procedure: LEFT HEART CATHETERIZATION WITH CORONARY ANGIOGRAM;  Surgeon: Micheline Chapman, MD;  Location: [facility] CATH LAB;  Service: Cardiovascular;  Laterality: N/A;   LOOP RECORDER IMPLANT N/A 06/05/2014   Procedure: LOOP RECORDER IMPLANT;  Surgeon: Hillis Range, MD;  Location: [facility] CATH LAB;  Service: Cardiovascular;  Laterality: N/A;   TEE WITHOUT CARDIOVERSION N/A 07/18/2013   Procedure: TRANSESOPHAGEAL ECHOCARDIOGRAM (TEE);  Surgeon: Lewayne Bunting, MD;  Location: [facility] ENDOSCOPY;  Service: Cardiovascular;  Laterality: N/A;   TEE WITHOUT CARDIOVERSION N/A 08/18/2013   Procedure: TRANSESOPHAGEAL ECHOCARDIOGRAM (TEE);  Surgeon: Loraine Leriche  Anne Fu, MD;  Location: [facility] ENDOSCOPY;  Service: Cardiovascular;  Laterality: N/A;   TEE WITHOUT CARDIOVERSION N/A 10/27/2013   Procedure: TRANSESOPHAGEAL ECHOCARDIOGRAM (TEE);  Surgeon: Pricilla Riffle, MD;  Location: [facility] ENDOSCOPY;  Service: Cardiovascular;  Laterality: N/A;   TEE WITHOUT CARDIOVERSION N/A 11/04/2014   Procedure: TRANSESOPHAGEAL ECHOCARDIOGRAM  (TEE);  Surgeon: Chrystie Nose, MD;  Location: [facility] ENDOSCOPY;  Service: Cardiovascular;  Laterality: N/A;   Patient Active Problem List   Diagnosis Date Noted   Scoliosis, or kyphoscoliosis, idiopathic 11/09/2022   Osteoporosis 11/09/2022   Primary osteoarthritis of right hip 01/31/2022   Greater trochanteric bursitis of right hip 01/31/2022   History of cervical dysplasia    Capsulitis of metatarsophalangeal (MTP) joint of right foot 01/31/2021   Bilateral bunions 12/27/2020   Essential hypertension 09/10/2020   Generalized anxiety disorder 09/22/2019   Hypercholesterolemia 09/22/2019   Major depressive disorder 09/22/2019   Overweight 07/18/2019   Fracture of sacrum (HCC) 01/30/2019   History of lumbar spinal fusion 01/16/2019   Leg weakness, bilateral 11/28/2017   Loss of bladder control 11/28/2017   Lumbar radiculopathy 11/28/2017   Spinal stenosis at L4-L5 level 11/28/2017   Degenerative disc disease, lumbar 10/30/2017   Degenerative scoliosis 10/30/2017   Long term (current) use of anticoagulants 10/30/2017   NICM (nonischemic cardiomyopathy) 10/30/2017   Spondylolisthesis at L4-L5 level 10/30/2017   Fatigue 04/13/2014   Sinus bradycardia 08/28/2013   Coronary artery disease    Chronic systolic heart failure    Atrial fibrillation    History of asthma 07/21/2013   Moderate mitral regurgitation 07/17/2013    PCP: [name], MD   REFERRING PROVIDER: Sherryl Manges*   REFERRING DIAG:  [phone] (ICD-10-CM) - Spondylolisthesis of lumbar region  [phone] (ICD-10-CM) - Status post lumbar spine operation    Rationale for Evaluation and Treatment: Rehabilitation  THERAPY DIAG:  No diagnosis found.  ONSET DATE: Jan 15, 2023 Fusion L5/S1  SUBJECTIVE:                                                                                                                                                                                           SUBJECTIVE STATEMENT: "  Worked hard yesterday emptying out clothes yesterday, sore today."  PERTINENT HISTORY:  01/15/23 Fusion L5/S1 with hardware removal.  Previous back surgery (L2/3, 3/4, 4/5 fusion); osteoporosis, CAD, chronic systolic heart failure, Afib, history bil foot fractures.  Sacroplasty.   PAIN:  Are you having pain? Yes: NPRS scale: 1/10 Pain location: sacrum Pain description: dull ache Aggravating factors: sitting too long, sitting on hard surfaces, sitting without back support  Relieving factors: cushions, tylenol  PRECAUTIONS: Fall  RED FLAGS: None   WEIGHT BEARING RESTRICTIONS: No  FALLS:  Has patient fallen in last 6 months? No but feels very unsteady.    LIVING ENVIRONMENT: Lives with: lives with their spouse Lives in: House/apartment Stairs: No Has following equipment at home: Dan Humphreys - 2 wheeled  OCCUPATION: disabled  PLOF: Independent  PATIENT GOALS: regain my balance, hopefully improve pain  NEXT MD VISIT: March 2025  OBJECTIVE:   DIAGNOSTIC FINDINGS:  12/07/2022 CT Lumbar Spine (PRE-OP) IMPRESSION: 1. Previous posterior decompression and discectomy at L2-3. Right-sided L3 pedicle screw does breach the superior endplate and enter the lateral disc space. No evidence of motion of either of the L3 screws. There is mild lucency and surrounding sclerosis at the left L2 screw. Possibility of mild screw motion does exist, but this does not appear pronounced. 2. Previous posterior decompression, diskectomy and fusion at L3-4 and L4-5. No evidence of motion or hardware failure. 3. L5-S1: Bulging of the disc. Bilateral facet arthropathy worse on the left than the right. Left foraminal stenosis primarily due to osteophytic encroachment from facet arthropathy in combination with endplate osteophytes and bulging disc that could affect the exiting left L5 nerve.  PATIENT SURVEYS:  Modified Oswestry 19/50   COGNITION: Overall cognitive status: Within functional limits for  tasks assessed     SENSATION: Diminished to light touch L S1 dermatome.  Reports tingling in L foot still after surgery.   MUSCLE LENGTH: NT  POSTURE:  decreased cervical lordosis  PALPATION: NA  LUMBAR ROM:   AROM eval  Flexion   Extension   Right lateral flexion   Left lateral flexion   Right rotation   Left rotation    (Blank rows = not tested)  LOWER EXTREMITY ROM:      NT today  LOWER EXTREMITY MMT:    MMT Right* eval Left* eval  Hip flexion 4+ 4  Hip extension    Hip abduction 5 5  Hip adduction 5 5  Knee flexion 5 5  Knee extension 5 5  Ankle dorsiflexion 5 5  Ankle plantarflexion 5 WB 5 WB   (Blank rows = not tested) * tested in sitting  FUNCTIONAL TESTS:  5 times sit to stand: 19.5 sec with bil UE assist (needed for eccentric control due to sacral pain) Functional gait assessment: 12/30 MCTSIB: Condition 1: Avg of 3 trials: 30 sec, Condition 2: Avg of 3 trials: 30 sec, Condition 3: Avg of 3 trials: 30 sec, Condition 4: Avg of 3 trials: 30 sec, and Total Score: 120/120   GAIT: Distance walked: 200' Assistive device utilized: None Level of assistance: Complete Independence Comments: occasionally unsteady, visually slow gait speed.   TODAY'S TREATMENT:                                                                                                                              DATE: 05/15/23 Nustep L5x20min Leg press 25lb 2x15 Seated rows 25lb  2x15 Knee flexion 25lb 2x15 Knee extension 15lb 2x15  05/10/23  [facility] PT Assessment - 05/10/23 0001       Functional Gait  Assessment   Gait Level Surface Walks 20 ft in less than 7 sec but greater than 5.5 sec, uses assistive device, slower speed, mild gait deviations, or deviates 6-10 in outside of the 12 in walkway width.    Change in Gait Speed Able to smoothly change walking speed without loss of balance or gait deviation. Deviate no more than 6 in outside of the 12 in walkway width.    Gait with  Horizontal Head Turns Performs head turns smoothly with slight change in gait velocity (eg, minor disruption to smooth gait path), deviates 6-10 in outside 12 in walkway width, or uses an assistive device.    Gait with Vertical Head Turns Performs task with slight change in gait velocity (eg, minor disruption to smooth gait path), deviates 6 - 10 in outside 12 in walkway width or uses assistive device    Gait and Pivot Turn Pivot turns safely within 3 sec and stops quickly with no loss of balance.    Step Over Obstacle Is able to step over 2 stacked shoe boxes taped together (9 in total height) without changing gait speed. No evidence of imbalance.    Gait with Narrow Base of Support Ambulates 7-9 steps.    Gait with Eyes Closed Walks 20 ft, uses assistive device, slower speed, mild gait deviations, deviates 6-10 in outside 12 in walkway width. Ambulates 20 ft in less than 9 sec but greater than 7 sec.    Ambulating Backwards Walks 20 ft, uses assistive device, slower speed, mild gait deviations, deviates 6-10 in outside 12 in walkway width.    Steps Alternating feet, must use rail.    Total Score 23           Leg press 25lb 2x10 BLE Knee flexion 25lb 2x10  Knee extension 15lb 2x10 Standing shoulder ext 10lb 2x10  05/08/23 Neuromuscular Reeducation: for balance, coordination and proprioception.  In hallway with SBA for safety.  Gait X 600' for warm-up Gait with head turns x 150' Gait with head nods x 150' Tandem walking x 50' Reverse tandem walking x 50' Cross steps x 50' each direction Braiding x 50' each direction Star excursion pattern taps 4 directions x 5 - minA to steady Therapeutic Activity:   Safety with rebounder exercises and modifications (plank on wall v. On rebounder, weight shifts rather than jumping, video link given and key words)    05/03/23 Recumbent bike L3x83min NEUROMUSCULAR RE-EDUCATION: To improve proprioception, balance, and kinesthesia. In hallway: - Tandem  gait - walking with horiz and vert head turns - walking w/ horiz head turns counting up by two's - Walking with quick reactions- stopping, turning around, retro gait - braiding both ways - gait while searching for colored cones hidden around the clinic  05/01/23  Therapeutic Activity:  reviewing goals and concerns Bike L2 x 10 min for warm up while completing  modified Oswestry  Neuromuscular Reeducation: for balance, coordination and proprioception, SBA for safety In hallway- Tandem gait Retro tandem gait Gait with ball toss Side stepping with ball toss Review of HEP for corner balance exercises and update     04/26/23 Walking outside on uneven terrain (grass, hay, humps and hills in grass) CGA by therapist  5xSTS- 13 seconds Seated series of R/L arm raise, then R/L trunk rotation 5x 5 reps each for core activation  04/24/2023  Therapeutic Exercise: to improve strength and mobility.  Demo, verbal and tactile cues throughout for technique. Bike x 5 min Forward RDLS x 10 each side Neuromuscular Reeducation: for balance, coordination and proprioception, In corner for safety, On level surface Eyes open feet together x 30 sec  Eyes open feet together with head nods x 10 Eyes open feet together with head turns x 10 Eyes closed feet together x 30 sec Eyes closed feet together with head nods x 10 Eyes closed feet together with head turns x 10  In corner for safety, On airex Eyes open feet apart x 30 sec  Eyes open feet apart with head nods x 10 Eyes open feet apart with head turns x 10 Eyes closed feet apart x 30 sec Eyes closed feet apart with head nods x 10 Eyes closed feet apart with head turns x 10  With CGA: Star excursion pattern: Forward, side, back, cross step x 10 each side    PATIENT EDUCATION:  Education details: education on rebounding on modifications for safety Person educated: Patient Education method: Explanation, Demonstration, Verbal cues, and  Handouts Education comprehension: verbalized understanding  HOME EXERCISE PROGRAM: Access Code: [email or web address] URL: [email or web address] Date: 05/01/2023 Prepared by: Harrie Foreman  Exercises - Supine Posterior Pelvic Tilt  - 1 x daily - 7 x weekly - 2 sets - 10 reps - Supine Bridge with Resistance Band  - 1 x daily - 7 x weekly - 3 sets - 10 reps - Clamshell with Resistance  - 1 x daily - 7 x weekly - 3 sets - 10 reps - Standing Gastroc Stretch on Foam 1/2 Roll  - 1 x daily - 7 x weekly - 1 sets - 3 reps - 30 hold - Standing Gastroc Stretch  - 1 x daily - 7 x weekly - 1 sets - 3 reps - 30 sec hold - Standing Tandem Balance with Counter Support  - 1 x daily - 7 x weekly - 1 sets - 3 reps - 30 sec hold - Standing Single Leg Stance with Counter Support  - 1 x daily - 7 x weekly - 1 sets - 3 reps - 30 sec  hold - Corner Balance   - 3 x daily - 7 x weekly - Tandem Walking  - 1 x daily - 7 x weekly - 3 sets - 10 reps  ASSESSMENT:  CLINICAL IMPRESSION: Continued with machine strengthening to for LE strength and postural strength. Reviewed gym machines best for use at gym since she is planning on D/C. Reviewed the correct set-up and positioning for each machine as needed.  Sharlot Gowda continues to demonstrate potential for improvement and would benefit from continued skilled therapy to address impairments.       OBJECTIVE IMPAIRMENTS: Abnormal gait, decreased activity tolerance, decreased balance, decreased endurance, difficulty walking, decreased strength, impaired perceived functional ability, and pain.   ACTIVITY LIMITATIONS: carrying, lifting, bending, standing, squatting, sleeping, transfers, and locomotion level  PARTICIPATION LIMITATIONS: meal prep, cleaning, laundry, shopping, community activity, and yard work  PERSONAL FACTORS: Behavior pattern, Fitness, Past/current experiences, and 3+ comorbidities: Previous back surgery (L2/3, 3/4, 4/5 fusion); osteoporosis,  CAD, chronic systolic heart failure, Afib, history bil foot fractures  are also affecting patient's functional outcome.   REHAB POTENTIAL: Good  CLINICAL DECISION MAKING: Evolving/moderate complexity  EVALUATION COMPLEXITY: Moderate   GOALS: Goals reviewed with patient? Yes  SHORT TERM GOALS: Target date: 03/26/2023   Patient will be independent with initial HEP.  Baseline: TBD Goal status: MET 04/03/23   LONG TERM GOALS: Target date: 05/21/2023   Patient will be independent with advanced/ongoing HEP to improve outcomes and carryover.  Baseline:  Goal status: IN PROGRESS 04/03/23 updated  2.  Patient will report 75% improvement in low back/sacral pain to improve QOL.  Baseline: constant dull ache Goal status: MET 04/26/23  3.  Patient will demonstrate improved functional strength as demonstrated by 5x STS < 15 sec to decrease fall risk. Baseline: 19.5 sec with bil UE assist Goal status: MET- 04/26/23  4.  Patient will report at least 6 points improvement on modified Oswestry to demonstrate improved functional ability.  Baseline: 19/50 Goal status: IN PROGRESS 05/01/23 16/50 Modified Oswestry Low Back Pain Disability Questionnaire: 13 / 50 = 26.0 %   5.  Patient will score at least 19/30 on FGA to decrease risk of falls with injury. Baseline: 12/30 Goal status: MET- 05/10/23  PLAN:  PT FREQUENCY: 2x/week  PT DURATION: 10 weeks  PLANNED INTERVENTIONS: 97110-Therapeutic exercises, 97530- Therapeutic activity, 97112- Neuromuscular re-education, 97535- Self Care, 16109- Manual therapy, [phone]- Gait training, [id number]- Electrical stimulation (unattended), [phone]- Electrical stimulation (manual), [id number]- Ultrasound, Patient/Family education, Balance training, Stair training, Taping, Dry Needling, Joint mobilization, Joint manipulation, Spinal mobilization, Vestibular training, Cryotherapy, and Moist heat.  PLAN FOR NEXT SESSION: focus on higher level balance training.   Reginaldo Hazard L Karys Meckley,  PT 05/17/2023, 12:16 PM

## 2023-05-17 NOTE — Therapy (Signed)
 OUTPATIENT PHYSICAL THERAPY TREATMENT   Patient Name: DAILIN SOSNOWSKI MRN: [id number] DOB:May 26, 1960, 63 y.o., female Today's Date: 05/17/2023  END OF SESSION:  PT End of Session - 05/17/23 1217     Visit Number 16    Date for PT Re-Evaluation 05/21/23    Authorization Type [employer]    PT Start Time 1145    PT Stop Time 1228    PT Time Calculation (min) 43 min    Activity Tolerance Patient tolerated treatment well    Behavior During Therapy [place] for tasks assessed/performed                       Past Medical History:  Diagnosis Date   Atrial fibrillation    a. admx with AFib with RVR and acute systolic CHF 07/2013; TEE with LAA clot and no DCCV done;     Bilateral bunions 12/27/2020   Capsulitis of metatarsophalangeal (MTP) joint of right foot 01/31/2021   Chronic systolic heart failure    Coronary artery disease    a.  LHC (07/17/13):  LM 20%, ostial CFX 20-30%   Degenerative disc disease, lumbar 10/30/2017   Degenerative scoliosis 10/30/2017   Essential hypertension 09/10/2020   Fatigue 04/13/2014   Generalized anxiety disorder 09/22/2019   History of asthma 07/21/2013   History of cervical dysplasia 12/22/2021   History of kidney stones 2013   History of lumbar spinal fusion 01/16/2019   Hypercholesterolemia 09/22/2019   Hyperlipemia    Hypothyroidism    past hx   Leg weakness, bilateral 11/28/2017   Long term (current) use of anticoagulants 10/30/2017   Loss of bladder control 11/28/2017   Lumbar radiculopathy 11/28/2017   Major depressive disorder 09/22/2019   Moderate mitral regurgitation 07/17/2013   NICM (nonischemic cardiomyopathy)    a. Echo (07/17/13):  EF 25-30%, EF subsequently normalized with sinus rhythm (tachycardia mediated CM)   Osteopenia    Overweight 07/18/2019   Sinus bradycardia 08/28/2013   Spondylolisthesis at L4-L5 level 10/30/2017   Past Surgical History:  Procedure Laterality Date   ABDOMINAL HYSTERECTOMY  ~ 2001   ABLATION   10/28/13   PVI by Dr Johney Frame   APPENDECTOMY  1978   ATRIAL FIBRILLATION ABLATION N/A 10/28/2013   Procedure: ATRIAL FIBRILLATION ABLATION;  Surgeon: Gardiner Rhyme, MD;  Location: [facility] CATH LAB;  Service: Cardiovascular;  Laterality: N/A;   ATRIAL FIBRILLATION ABLATION N/A 11/23/2020   Procedure: ATRIAL FIBRILLATION ABLATION;  Surgeon: Hillis Range, MD;  Location: [facility] INVASIVE CV LAB;  Service: Cardiovascular;  Laterality: N/A;   BACK SURGERY Bilateral 2019   Fusion   CARDIAC CATHETERIZATION  07/2013   CARDIOVERSION N/A 08/18/2013   Procedure: CARDIOVERSION;  Surgeon: Donato Schultz, MD;  Location: [facility] ENDOSCOPY;  Service: Cardiovascular;  Laterality: N/A;   CARDIOVERSION N/A 08/29/2013   Procedure: CARDIOVERSION;  Surgeon: Pricilla Riffle, MD;  Location: [facility] OR;  Service: Cardiovascular;  Laterality: N/A;   ELECTROPHYSIOLOGIC STUDY N/A 11/05/2014   Procedure: Atrial Fibrillation Ablation;  Surgeon: Hillis Range, MD;  Location: [facility] INVASIVE CV LAB;  Service: Cardiovascular;  Laterality: N/A;   EYE SURGERY Bilateral 1999   Corrective laser surgery   implantable loop recorder removal  10/02/2018   MDT Reveal LINQ removed in office by Dr Johney Frame for EOL battery   IR SACROPLASTY BILATERAL  02/06/2019   LEFT HEART CATHETERIZATION WITH CORONARY ANGIOGRAM N/A 07/17/2013   Procedure: LEFT HEART CATHETERIZATION WITH CORONARY ANGIOGRAM;  Surgeon: Micheline Chapman, MD;  Location: [facility] CATH  LAB;  Service: Cardiovascular;  Laterality: N/A;   LOOP RECORDER IMPLANT N/A 06/05/2014   Procedure: LOOP RECORDER IMPLANT;  Surgeon: Hillis Range, MD;  Location: [facility] CATH LAB;  Service: Cardiovascular;  Laterality: N/A;   TEE WITHOUT CARDIOVERSION N/A 07/18/2013   Procedure: TRANSESOPHAGEAL ECHOCARDIOGRAM (TEE);  Surgeon: Lewayne Bunting, MD;  Location: [facility] ENDOSCOPY;  Service: Cardiovascular;  Laterality: N/A;   TEE WITHOUT CARDIOVERSION N/A 08/18/2013   Procedure: TRANSESOPHAGEAL ECHOCARDIOGRAM (TEE);  Surgeon: Donato Schultz, MD;  Location:  [facility] ENDOSCOPY;  Service: Cardiovascular;  Laterality: N/A;   TEE WITHOUT CARDIOVERSION N/A 10/27/2013   Procedure: TRANSESOPHAGEAL ECHOCARDIOGRAM (TEE);  Surgeon: Pricilla Riffle, MD;  Location: [facility] ENDOSCOPY;  Service: Cardiovascular;  Laterality: N/A;   TEE WITHOUT CARDIOVERSION N/A 11/04/2014   Procedure: TRANSESOPHAGEAL ECHOCARDIOGRAM (TEE);  Surgeon: Chrystie Nose, MD;  Location: [facility] ENDOSCOPY;  Service: Cardiovascular;  Laterality: N/A;   Patient Active Problem List   Diagnosis Date Noted   Scoliosis, or kyphoscoliosis, idiopathic 11/09/2022   Osteoporosis 11/09/2022   Primary osteoarthritis of right hip 01/31/2022   Greater trochanteric bursitis of right hip 01/31/2022   History of cervical dysplasia    Capsulitis of metatarsophalangeal (MTP) joint of right foot 01/31/2021   Bilateral bunions 12/27/2020   Essential hypertension 09/10/2020   Generalized anxiety disorder 09/22/2019   Hypercholesterolemia 09/22/2019   Major depressive disorder 09/22/2019   Overweight 07/18/2019   Fracture of sacrum (HCC) 01/30/2019   History of lumbar spinal fusion 01/16/2019   Leg weakness, bilateral 11/28/2017   Loss of bladder control 11/28/2017   Lumbar radiculopathy 11/28/2017   Spinal stenosis at L4-L5 level 11/28/2017   Degenerative disc disease, lumbar 10/30/2017   Degenerative scoliosis 10/30/2017   Long term (current) use of anticoagulants 10/30/2017   NICM (nonischemic cardiomyopathy) 10/30/2017   Spondylolisthesis at L4-L5 level 10/30/2017   Fatigue 04/13/2014   Sinus bradycardia 08/28/2013   Coronary artery disease    Chronic systolic heart failure    Atrial fibrillation    History of asthma 07/21/2013   Moderate mitral regurgitation 07/17/2013    PCP: [name], MD   REFERRING PROVIDER: Sherryl Manges*   REFERRING DIAG:  [phone] (ICD-10-CM) - Spondylolisthesis of lumbar region  [phone] (ICD-10-CM) - Status post lumbar spine operation    Rationale for Evaluation  and Treatment: Rehabilitation  THERAPY DIAG:  Other low back pain  Unsteadiness on feet  Left lumbar radiculopathy  Unsteady gait when walking  Weakness of both hips  ONSET DATE: Jan 15, 2023 Fusion L5/S1  SUBJECTIVE:  SUBJECTIVE STATEMENT: " Worked hard yesterday emptying out clothes yesterday, sore at night with restless leg  PERTINENT HISTORY:  01/15/23 Fusion L5/S1 with hardware removal.  Previous back surgery (L2/3, 3/4, 4/5 fusion); osteoporosis, CAD, chronic systolic heart failure, Afib, history bil foot fractures.  Sacroplasty.   PAIN:  Are you having pain? Yes: NPRS scale: 1/10 Pain location: sacrum Pain description: dull ache Aggravating factors: sitting too long, sitting on hard surfaces, sitting without back support  Relieving factors: cushions, tylenol  PRECAUTIONS: Fall  RED FLAGS: None   WEIGHT BEARING RESTRICTIONS: No  FALLS:  Has patient fallen in last 6 months? No but feels very unsteady.    LIVING ENVIRONMENT: Lives with: lives with their spouse Lives in: House/apartment Stairs: No Has following equipment at home: Dan Humphreys - 2 wheeled  OCCUPATION: disabled  PLOF: Independent  PATIENT GOALS: regain my balance, hopefully improve pain  NEXT MD VISIT: March 2025  OBJECTIVE:   DIAGNOSTIC FINDINGS:  12/07/2022 CT Lumbar Spine (PRE-OP) IMPRESSION: 1. Previous posterior decompression and discectomy at L2-3. Right-sided L3 pedicle screw does breach the superior endplate and enter the lateral disc space. No evidence of motion of either of the L3 screws. There is mild lucency and surrounding sclerosis at the left L2 screw. Possibility of mild screw motion does exist, but this does not appear pronounced. 2. Previous posterior decompression, diskectomy and fusion at  L3-4 and L4-5. No evidence of motion or hardware failure. 3. L5-S1: Bulging of the disc. Bilateral facet arthropathy worse on the left than the right. Left foraminal stenosis primarily due to osteophytic encroachment from facet arthropathy in combination with endplate osteophytes and bulging disc that could affect the exiting left L5 nerve.  PATIENT SURVEYS:  Modified Oswestry 19/50   COGNITION: Overall cognitive status: Within functional limits for tasks assessed     SENSATION: Diminished to light touch L S1 dermatome.  Reports tingling in L foot still after surgery.   MUSCLE LENGTH: NT  POSTURE:  decreased cervical lordosis  PALPATION: NA  LUMBAR ROM:   AROM eval  Flexion   Extension   Right lateral flexion   Left lateral flexion   Right rotation   Left rotation    (Blank rows = not tested)  LOWER EXTREMITY ROM:      NT today  LOWER EXTREMITY MMT:    MMT Right* eval Left* eval  Hip flexion 4+ 4  Hip extension    Hip abduction 5 5  Hip adduction 5 5  Knee flexion 5 5  Knee extension 5 5  Ankle dorsiflexion 5 5  Ankle plantarflexion 5 WB 5 WB   (Blank rows = not tested) * tested in sitting  FUNCTIONAL TESTS:  5 times sit to stand: 19.5 sec with bil UE assist (needed for eccentric control due to sacral pain) Functional gait assessment: 12/30 MCTSIB: Condition 1: Avg of 3 trials: 30 sec, Condition 2: Avg of 3 trials: 30 sec, Condition 3: Avg of 3 trials: 30 sec, Condition 4: Avg of 3 trials: 30 sec, and Total Score: 120/120   GAIT: Distance walked: 200' Assistive device utilized: None Level of assistance: Complete Independence Comments: occasionally unsteady, visually slow gait speed.   TODAY'S TREATMENT:  DATE: 05/17/23: therapeutic activities: Nustep level 5 x 6 min Instructed in hip flexor stretch utilizing bench for leg   Extensive time discussing therex after dc today, to start going to [employer] with her husband, also we discussed swimming or water aerobics and possible trial of pilates She has mini trampoline and pt plans to utilize as well for stability and balance, has cleared with her surgeon   05/15/23 Nustep L5x34min Leg press 25lb 2x15 Seated rows 25lb 2x15 Knee flexion 25lb 2x15 Knee extension 15lb 2x15  05/10/23  [facility] PT Assessment - 05/10/23 0001       Functional Gait  Assessment   Gait Level Surface Walks 20 ft in less than 7 sec but greater than 5.5 sec, uses assistive device, slower speed, mild gait deviations, or deviates 6-10 in outside of the 12 in walkway width.    Change in Gait Speed Able to smoothly change walking speed without loss of balance or gait deviation. Deviate no more than 6 in outside of the 12 in walkway width.    Gait with Horizontal Head Turns Performs head turns smoothly with slight change in gait velocity (eg, minor disruption to smooth gait path), deviates 6-10 in outside 12 in walkway width, or uses an assistive device.    Gait with Vertical Head Turns Performs task with slight change in gait velocity (eg, minor disruption to smooth gait path), deviates 6 - 10 in outside 12 in walkway width or uses assistive device    Gait and Pivot Turn Pivot turns safely within 3 sec and stops quickly with no loss of balance.    Step Over Obstacle Is able to step over 2 stacked shoe boxes taped together (9 in total height) without changing gait speed. No evidence of imbalance.    Gait with Narrow Base of Support Ambulates 7-9 steps.    Gait with Eyes Closed Walks 20 ft, uses assistive device, slower speed, mild gait deviations, deviates 6-10 in outside 12 in walkway width. Ambulates 20 ft in less than 9 sec but greater than 7 sec.    Ambulating Backwards Walks 20 ft, uses assistive device, slower speed, mild gait deviations, deviates 6-10 in outside 12 in walkway width.    Steps  Alternating feet, must use rail.    Total Score 23           Leg press 25lb 2x10 BLE Knee flexion 25lb 2x10  Knee extension 15lb 2x10 Standing shoulder ext 10lb 2x10  05/08/23 Neuromuscular Reeducation: for balance, coordination and proprioception.  In hallway with SBA for safety.  Gait X 600' for warm-up Gait with head turns x 150' Gait with head nods x 150' Tandem walking x 50' Reverse tandem walking x 50' Cross steps x 50' each direction Braiding x 50' each direction Star excursion pattern taps 4 directions x 5 - minA to steady Therapeutic Activity:   Safety with rebounder exercises and modifications (plank on wall v. On rebounder, weight shifts rather than jumping, video link given and key words)    05/03/23 Recumbent bike L3x53min NEUROMUSCULAR RE-EDUCATION: To improve proprioception, balance, and kinesthesia. In hallway: - Tandem gait - walking with horiz and vert head turns - walking w/ horiz head turns counting up by two's - Walking with quick reactions- stopping, turning around, retro gait - braiding both ways - gait while searching for colored cones hidden around the clinic  05/01/23  Therapeutic Activity:  reviewing goals and concerns Bike L2 x 10 min for warm up while completing  modified Oswestry  Neuromuscular Reeducation: for balance, coordination and proprioception, SBA for safety In hallway- Tandem gait Retro tandem gait Gait with ball toss Side stepping with ball toss Review of HEP for corner balance exercises and update     04/26/23 Walking outside on uneven terrain (grass, hay, humps and hills in grass) CGA by therapist  5xSTS- 13 seconds Seated series of R/L arm raise, then R/L trunk rotation 5x 5 reps each for core activation 04/24/2023  Therapeutic Exercise: to improve strength and mobility.  Demo, verbal and tactile cues throughout for technique. Bike x 5 min Forward RDLS x 10 each side Neuromuscular Reeducation: for balance, coordination and  proprioception, In corner for safety, On level surface Eyes open feet together x 30 sec  Eyes open feet together with head nods x 10 Eyes open feet together with head turns x 10 Eyes closed feet together x 30 sec Eyes closed feet together with head nods x 10 Eyes closed feet together with head turns x 10  In corner for safety, On airex Eyes open feet apart x 30 sec  Eyes open feet apart with head nods x 10 Eyes open feet apart with head turns x 10 Eyes closed feet apart x 30 sec Eyes closed feet apart with head nods x 10 Eyes closed feet apart with head turns x 10  With CGA: Star excursion pattern: Forward, side, back, cross step x 10 each side    PATIENT EDUCATION:  Education details: education on rebounding on modifications for safety Person educated: Patient Education method: Explanation, Demonstration, Verbal cues, and Handouts Education comprehension: verbalized understanding  HOME EXERCISE PROGRAM: Access Code: [email or web address] URL: [email or web address] Date: 05/01/2023 Prepared by: Harrie Foreman  Exercises - Supine Posterior Pelvic Tilt  - 1 x daily - 7 x weekly - 2 sets - 10 reps - Supine Bridge with Resistance Band  - 1 x daily - 7 x weekly - 3 sets - 10 reps - Clamshell with Resistance  - 1 x daily - 7 x weekly - 3 sets - 10 reps - Standing Gastroc Stretch on Foam 1/2 Roll  - 1 x daily - 7 x weekly - 1 sets - 3 reps - 30 hold - Standing Gastroc Stretch  - 1 x daily - 7 x weekly - 1 sets - 3 reps - 30 sec hold - Standing Tandem Balance with Counter Support  - 1 x daily - 7 x weekly - 1 sets - 3 reps - 30 sec hold - Standing Single Leg Stance with Counter Support  - 1 x daily - 7 x weekly - 1 sets - 3 reps - 30 sec  hold - Corner Balance   - 3 x daily - 7 x weekly - Tandem Walking  - 1 x daily - 7 x weekly - 3 sets - 10 reps  ASSESSMENT:  CLINICAL IMPRESSION: Today completed discharge planning with Mrs Bethea.  She has a comprehensive home program to  follow.  We discussed many different aspects of follow up therex, cardiovascular care that she would benefit from following DC today.  She has a good grasp of what to do going forward.  DC today Sharlot Gowda continues to demonstrate potential for improvement and would benefit from continued skilled therapy to address impairments.       OBJECTIVE IMPAIRMENTS: Abnormal gait, decreased activity tolerance, decreased balance, decreased endurance, difficulty walking, decreased strength, impaired perceived functional ability, and pain.   ACTIVITY LIMITATIONS: carrying, lifting, bending, standing, squatting, sleeping,  transfers, and locomotion level  PARTICIPATION LIMITATIONS: meal prep, cleaning, laundry, shopping, community activity, and yard work  PERSONAL FACTORS: Behavior pattern, Fitness, Past/current experiences, and 3+ comorbidities: Previous back surgery (L2/3, 3/4, 4/5 fusion); osteoporosis, CAD, chronic systolic heart failure, Afib, history bil foot fractures  are also affecting patient's functional outcome.   REHAB POTENTIAL: Good  CLINICAL DECISION MAKING: Evolving/moderate complexity  EVALUATION COMPLEXITY: Moderate   GOALS: Goals reviewed with patient? Yes  SHORT TERM GOALS: Target date: 03/26/2023   Patient will be independent with initial HEP.  Baseline: TBD Goal status: MET 04/03/23   LONG TERM GOALS: Target date: 05/21/2023   Patient will be independent with advanced/ongoing HEP to improve outcomes and carryover.  Baseline:  Goal status: IN PROGRESS 04/03/23 updated  2.  Patient will report 75% improvement in low back/sacral pain to improve QOL.  Baseline: constant dull ache Goal status: MET 04/26/23  3.  Patient will demonstrate improved functional strength as demonstrated by 5x STS < 15 sec to decrease fall risk. Baseline: 19.5 sec with bil UE assist Goal status: MET- 04/26/23  4.  Patient will report at least 6 points improvement on modified Oswestry to demonstrate  improved functional ability.  Baseline: 19/50 Goal status: IN PROGRESS 05/01/23 16/50  5.  Patient will score at least 19/30 on FGA to decrease risk of falls with injury. Baseline: 12/30 Goal status: MET- 05/10/23  PLAN:  PT FREQUENCY: 2x/week  PT DURATION: 10 weeks  PLANNED INTERVENTIONS: 97110-Therapeutic exercises, 97530- Therapeutic activity, 97112- Neuromuscular re-education, 97535- Self Care, 29562- Manual therapy, [phone]- Gait training, [id number]- Electrical stimulation (unattended), [phone]- Electrical stimulation (manual), [id number]- Ultrasound, Patient/Family education, Balance training, Stair training, Taping, Dry Needling, Joint mobilization, Joint manipulation, Spinal mobilization, Vestibular training, Cryotherapy, and Moist heat.  PLAN FOR NEXT SESSION: DC today  Kristjan Derner L Tesia Lybrand, PT, DPT, OCS 05/17/2023, 6:03 PM

## 2023-05-23 ENCOUNTER — Ambulatory Visit (INDEPENDENT_AMBULATORY_CARE_PROVIDER_SITE_OTHER): Admitting: Behavioral Health

## 2023-05-23 ENCOUNTER — Encounter: Payer: Self-pay | Admitting: Behavioral Health

## 2023-05-23 DIAGNOSIS — F4321 Adjustment disorder with depressed mood: Secondary | ICD-10-CM | POA: Diagnosis not present

## 2023-05-23 DIAGNOSIS — F411 Generalized anxiety disorder: Secondary | ICD-10-CM

## 2023-05-23 NOTE — Progress Notes (Signed)
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 to the loss of her father and mother and have less feelings of sadness over her losses as evidenced by patient's self-report and therapy notes.  We will continue the grieving process over the death of her father especially and help her feel less overwhelmed with the losses.  Interventions will include the patient telling her grief story about her father and her mother, identify and educate the different stages of grief for the patient to identify with, encouraged the patient to show pictures and memorabilia to help process feelings and help her process any feelings of guilt associated with her loss. Progress: 35% I reviewed the treatment goals with the patient and she agreed to continue with the goals as stated above of reduction of grief and anxiety with the new Target date of August 13, 2023. French Ana, [facility]                            French Ana, [facility]               French Ana, [facility]               French Ana, [facility]

## 2023-06-06 ENCOUNTER — Encounter: Payer: Self-pay | Admitting: Behavioral Health

## 2023-06-06 ENCOUNTER — Ambulatory Visit: Admitting: Behavioral Health

## 2023-06-06 DIAGNOSIS — F411 Generalized anxiety disorder: Secondary | ICD-10-CM

## 2023-06-06 DIAGNOSIS — F4321 Adjustment disorder with depressed mood: Secondary | ICD-10-CM
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
## 2023-06-20 ENCOUNTER — Encounter: Payer: Self-pay | Admitting: Behavioral Health

## 2023-06-20 ENCOUNTER — Ambulatory Visit: Admitting: Behavioral Health

## 2023-06-20 DIAGNOSIS — F4321 Adjustment disorder with depressed mood: Secondary | ICD-10-CM
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
## 2023-06-24 ENCOUNTER — Other Ambulatory Visit: Payer: Self-pay | Admitting: Cardiology

## 2023-06-24 DIAGNOSIS — I480 Paroxysmal atrial fibrillation: Secondary | ICD-10-CM

## 2023-06-25 NOTE — Telephone Encounter (Signed)
 Prescription refill request for Eliquis  received. Indication:afib Last office visit:11/24 Scr:0.79  11/24 Age: 63 Weight:90.3  kg  Prescription refilled

## 2023-07-06 ENCOUNTER — Encounter: Payer: Self-pay | Admitting: Behavioral Health

## 2023-07-06 ENCOUNTER — Ambulatory Visit: Admitting: Behavioral Health

## 2023-07-06 DIAGNOSIS — F4321 Adjustment disorder with depressed mood: Secondary | ICD-10-CM

## 2023-07-06 LAB — LAB REPORT - SCANNED
EGFR (Non-African Amer.): 85
TSH: 0.78 (ref 0.41–5.90)
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
## 2023-07-12 NOTE — Progress Notes (Unsigned)
  Electrophysiology Office Follow up Visit Note:    Date:  07/13/2023   ID:  Kiara [name], Kiara [name] 16, 1962, MRN [id number]  PCP:  Imelda Man, MD  [facility] Cardiologist:  None  [facility] Electrophysiologist:  Boyce Byes, MD    Interval History:     Kiara [name] is a 63 y.o. female who presents for a follow up visit.   The patient last saw Ottie Blonder in January 10, 2023.  She has had 3 prior catheter ablations (2015, 2016, 2022).  At the appointment with Ottie Blonder she was doing well on flecainide  and metoprolol .  She is on Eliquis  for stroke prophylaxis.  She was previously on Tikosyn  but this was stopped because of QT prolongation.  She is doing well today.  No problems with her heart rhythm.  No recurrent episodes of atrial fibrillation that she is aware.  She continues to take flecainide  twice daily.  She takes her Eliquis  without missed doses.       Past medical, surgical, social and family history were reviewed.  ROS:   Please see the history of present illness.    All other systems reviewed and are negative.  EKGs/Labs/Other Studies Reviewed:    The following studies were reviewed today:  January 10, 2023 EKG shows sinus rhythm.  PR 170 ms.  QRS duration 104 ms.   EKG Interpretation Date/Time:  Friday Jul 13 2023 08:51:31 EDT Ventricular Rate:  54 PR Interval:  192 QRS Duration:  108 QT Interval:  434 QTC Calculation: 411 R Axis:   72  Text Interpretation: Sinus bradycardia Confirmed by Harvie Liner [phone] on 07/13/2023 8:54:14 AM    Physical Exam:    VS:  There were no vitals taken for this visit.    Wt Readings from Last 3 Encounters:  01/10/23 199 lb (90.3 kg)  07/04/22 206 lb (93.4 kg)  06/02/22 206 lb (93.4 kg)     GEN: no distress CARD: RRR, No MRG RESP: No IWOB. CTAB.      ASSESSMENT:    1. Paroxysmal atrial fibrillation (HCC)   2. Encounter for long-term (current) use of high-risk medication    PLAN:    In order  of problems listed above:  #Atrial fibrillation, paroxysmal #High risk med monitoring-flecainide  Doing well on flecainide  and metoprolol .  Continue.  PR and QRS durations acceptable for ongoing flecainide  use. Continue Eliquis  for stroke prophylaxis   Follow-up 6 months with A-fib clinic.  Signed, Harvie Liner, MD, [facility], [facility] 07/13/2023 8:54 AM    Electrophysiology [facility]

## 2023-07-13 ENCOUNTER — Ambulatory Visit: Payer: Self-pay | Attending: Cardiology | Admitting: Cardiology

## 2023-07-13 ENCOUNTER — Encounter: Payer: Self-pay | Admitting: Cardiology

## 2023-07-13 VITALS — BP 122/80 | HR 54 | Ht 66.0 in | Wt 203.0 lb

## 2023-07-13 DIAGNOSIS — I480 Paroxysmal atrial fibrillation: Secondary | ICD-10-CM | POA: Diagnosis not present

## 2023-07-13 DIAGNOSIS — Z79899 Other long term (current) drug therapy: Secondary | ICD-10-CM | POA: Diagnosis not present

## 2023-07-13 NOTE — Patient Instructions (Signed)
 Medication Instructions:  Your physician recommends that you continue on your current medications as directed. Please refer to the Current Medication list given to you today.  *If you need a refill on your cardiac medications before your next appointment, please call your pharmacy*   Follow-Up: At [facility], you and your health needs are our priority.  As part of our continuing mission to provide you with exceptional heart care, our providers are all part of one team.  This team includes your primary Cardiologist (physician) and Advanced Practice Providers or APPs (Physician Assistants and Nurse Practitioners) who all work together to provide you with the care you need, when you need it.  Your next appointment:   6 months  Provider:   You will follow up in the Atrial Fibrillation Clinic  Your provider will be: Clint R. Fenton, PA-C or Minnie Amber, PA-C

## 2023-07-14 ENCOUNTER — Other Ambulatory Visit: Payer: Self-pay | Admitting: Student

## 2023-07-18 ENCOUNTER — Ambulatory Visit: Admitting: Behavioral Health

## 2023-07-18 DIAGNOSIS — F4321 Adjustment disorder with depressed mood: Secondary | ICD-10-CM

## 2023-07-18 DIAGNOSIS — F4381 Prolonged grief disorder: Secondary | ICD-10-CM | POA: Diagnosis not present

## 2023-07-18 DIAGNOSIS — F411 Generalized anxiety disorder: Secondary | ICD-10-CM
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
## 2023-08-01 ENCOUNTER — Ambulatory Visit (INDEPENDENT_AMBULATORY_CARE_PROVIDER_SITE_OTHER): Admitting: Behavioral Health

## 2023-08-01 DIAGNOSIS — F4381 Prolonged grief disorder: Secondary | ICD-10-CM

## 2023-08-01 DIAGNOSIS — F411 Generalized anxiety disorder: Secondary | ICD-10-CM

## 2023-08-01 DIAGNOSIS — F4321 Adjustment disorder with depressed mood: Secondary | ICD-10-CM
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
## 2023-08-10 ENCOUNTER — Encounter (HOSPITAL_COMMUNITY): Payer: Self-pay | Admitting: Interventional Radiology

## 2023-08-16 ENCOUNTER — Encounter: Payer: Self-pay | Admitting: Behavioral Health

## 2023-08-16 ENCOUNTER — Ambulatory Visit: Admitting: Behavioral Health

## 2023-08-16 DIAGNOSIS — F4381 Prolonged grief disorder: Secondary | ICD-10-CM | POA: Diagnosis not present

## 2023-08-16 DIAGNOSIS — F4321 Adjustment disorder with depressed mood: Secondary | ICD-10-CM
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
## 2023-08-26 ENCOUNTER — Other Ambulatory Visit: Payer: Self-pay | Admitting: Student

## 2023-08-30 ENCOUNTER — Ambulatory Visit: Admitting: Behavioral Health

## 2023-08-30 DIAGNOSIS — F4381 Prolonged grief disorder: Secondary | ICD-10-CM

## 2023-08-30 DIAGNOSIS — F4321 Adjustment disorder with depressed mood: Secondary | ICD-10-CM
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
## 2023-09-26 ENCOUNTER — Ambulatory Visit (INDEPENDENT_AMBULATORY_CARE_PROVIDER_SITE_OTHER): Admitting: Behavioral Health

## 2023-09-26 DIAGNOSIS — F4321 Adjustment disorder with depressed mood: Secondary | ICD-10-CM | POA: Diagnosis not present
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
## 2023-10-10 ENCOUNTER — Ambulatory Visit: Admitting: Behavioral Health

## 2023-10-10 ENCOUNTER — Encounter: Payer: Self-pay | Admitting: Behavioral Health

## 2023-10-10 DIAGNOSIS — F4381 Prolonged grief disorder: Secondary | ICD-10-CM | POA: Diagnosis not present

## 2023-10-10 DIAGNOSIS — F411 Generalized anxiety disorder: Secondary | ICD-10-CM
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
## 2023-10-24 ENCOUNTER — Ambulatory Visit (INDEPENDENT_AMBULATORY_CARE_PROVIDER_SITE_OTHER): Admitting: Behavioral Health

## 2023-10-24 ENCOUNTER — Encounter: Payer: Self-pay | Admitting: Behavioral Health

## 2023-10-24 DIAGNOSIS — F4321 Adjustment disorder with depressed mood: Secondary | ICD-10-CM | POA: Diagnosis not present

## 2023-10-24 DIAGNOSIS — F411 Generalized anxiety disorder: Secondary | ICD-10-CM
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
## 2023-11-07 ENCOUNTER — Encounter: Payer: Self-pay | Admitting: Behavioral Health

## 2023-11-07 ENCOUNTER — Ambulatory Visit (INDEPENDENT_AMBULATORY_CARE_PROVIDER_SITE_OTHER): Admitting: Behavioral Health

## 2023-11-07 DIAGNOSIS — F4381 Prolonged grief disorder: Secondary | ICD-10-CM

## 2023-11-07 DIAGNOSIS — F411 Generalized anxiety disorder: Secondary | ICD-10-CM

## 2023-11-07 DIAGNOSIS — F4321 Adjustment disorder with depressed mood: Secondary | ICD-10-CM
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
## 2023-11-21 ENCOUNTER — Encounter: Payer: Self-pay | Admitting: Behavioral Health

## 2023-11-21 ENCOUNTER — Ambulatory Visit: Admitting: Behavioral Health

## 2023-11-21 DIAGNOSIS — F411 Generalized anxiety disorder: Secondary | ICD-10-CM

## 2023-11-21 DIAGNOSIS — F4381 Prolonged grief disorder: Secondary | ICD-10-CM
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
## 2023-11-25 ENCOUNTER — Other Ambulatory Visit: Payer: Self-pay | Admitting: Cardiology

## 2023-12-04 ENCOUNTER — Encounter: Payer: Self-pay | Admitting: Behavioral Health

## 2023-12-05 ENCOUNTER — Ambulatory Visit: Admitting: Behavioral Health

## 2023-12-05 DIAGNOSIS — F411 Generalized anxiety disorder: Secondary | ICD-10-CM | POA: Diagnosis not present

## 2023-12-05 DIAGNOSIS — F4381 Prolonged grief disorder: Secondary | ICD-10-CM | POA: Diagnosis not present
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
## 2023-12-20 ENCOUNTER — Ambulatory Visit: Admitting: Behavioral Health

## 2023-12-20 ENCOUNTER — Encounter: Payer: Self-pay | Admitting: Behavioral Health

## 2023-12-20 DIAGNOSIS — F411 Generalized anxiety disorder: Secondary | ICD-10-CM

## 2023-12-20 DIAGNOSIS — F331 Major depressive disorder, recurrent, moderate: Secondary | ICD-10-CM

## 2023-12-20 DIAGNOSIS — F4323 Adjustment disorder with mixed anxiety and depressed mood: Secondary | ICD-10-CM
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
## 2024-01-02 ENCOUNTER — Ambulatory Visit: Admitting: Behavioral Health

## 2024-01-08 ENCOUNTER — Encounter: Payer: Self-pay | Admitting: Behavioral Health

## 2024-01-08 ENCOUNTER — Ambulatory Visit: Admitting: Behavioral Health

## 2024-01-08 DIAGNOSIS — F4323 Adjustment disorder with mixed anxiety and depressed mood: Secondary | ICD-10-CM
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
## 2024-01-14 ENCOUNTER — Ambulatory Visit (HOSPITAL_COMMUNITY)
Admission: RE | Admit: 2024-01-14 | Discharge: 2024-01-14 | Disposition: A | Discharge status: Home / Self Care | Source: Ambulatory Visit | Attending: Physician Assistant | Admitting: Physician Assistant

## 2024-01-14 VITALS — BP 136/72 | HR 50 | Ht 66.0 in | Wt 199.0 lb

## 2024-01-14 DIAGNOSIS — D6869 Other thrombophilia: Secondary | ICD-10-CM | POA: Diagnosis not present

## 2024-01-14 DIAGNOSIS — Z5181 Encounter for therapeutic drug level monitoring: Secondary | ICD-10-CM | POA: Diagnosis not present

## 2024-01-14 DIAGNOSIS — I480 Paroxysmal atrial fibrillation: Secondary | ICD-10-CM

## 2024-01-14 DIAGNOSIS — Z79899 Other long term (current) drug therapy: Secondary | ICD-10-CM | POA: Diagnosis not present

## 2024-01-14 DIAGNOSIS — I4891 Unspecified atrial fibrillation: Secondary | ICD-10-CM

## 2024-01-14 NOTE — Progress Notes (Signed)
 Primary Care Physician: Clarice Nottingham, MD Primary Cardiologist: None Electrophysiologist: OLE ONEIDA HOLTS, MD  Referring Physician: Dr Kelsie   Madison MALVA Kiara [name] is a 63 y.o. female with a history of HTN, HLD, HFrecEF, atrial fibrillation who presents for follow up in the [facility].  The patient was initially diagnosed with atrial fibrillation remotely and has had 3 afib ablations 2015, 2016, and 2022. She was previously on Tikosyn  but this was stopped because of QT prolongation. She has been maintained on flecainide . Patient is on Eliquis  for stroke prevention.    Patient presents today for follow up for atrial fibrillation and flecainide  monitoring. She is in SR today and feels well. She did have a short episode of afib while acutely ill with an URI, resolved spontaneously. No bleeding issues on anticoagulation.   Today, she denies symptoms of palpitations, chest pain, shortness of breath, orthopnea, PND, lower extremity edema, dizziness, presyncope, syncope, snoring, daytime somnolence, bleeding, or neurologic sequela. The patient is tolerating medications without difficulties and is otherwise without complaint today.    Atrial Fibrillation Risk Factors:  she does not have symptoms or diagnosis of sleep apnea. she does not have a history of rheumatic fever.   Atrial Fibrillation Management history:  Previous antiarrhythmic drugs: dofetilide  (QT prolongation), flecainide   Previous cardioversions: 2015 x 2 Previous ablations: 10/28/13, 2016, 11/23/20 Anticoagulation history: Eliquis   ROS- All systems are reviewed and negative except as per the HPI above.  Past Medical History:  Diagnosis Date   Atrial fibrillation    a. admx with AFib with RVR and acute systolic CHF 07/2013; TEE with LAA clot and no DCCV done;     Bilateral bunions 12/27/2020   Capsulitis of metatarsophalangeal (MTP) joint of right foot 01/31/2021   Chronic systolic heart failure     Coronary artery disease    a.  LHC (07/17/13):  LM 20%, ostial CFX 20-30%   Degenerative disc disease, lumbar 10/30/2017   Degenerative scoliosis 10/30/2017   Essential hypertension 09/10/2020   Fatigue 04/13/2014   Generalized anxiety disorder 09/22/2019   History of asthma 07/21/2013   History of cervical dysplasia 12/22/2021   History of kidney stones 2013   History of lumbar spinal fusion 01/16/2019   Hypercholesterolemia 09/22/2019   Hyperlipemia    Hypothyroidism    past hx   Leg weakness, bilateral 11/28/2017   Long term (current) use of anticoagulants 10/30/2017   Loss of bladder control 11/28/2017   Lumbar radiculopathy 11/28/2017   Major depressive disorder 09/22/2019   Moderate mitral regurgitation 07/17/2013   NICM (nonischemic cardiomyopathy)    a. Echo (07/17/13):  EF 25-30%, EF subsequently normalized with sinus rhythm (tachycardia mediated CM)   Osteopenia    Overweight 07/18/2019   Sinus bradycardia 08/28/2013   Spondylolisthesis at L4-L5 level 10/30/2017    Current Outpatient Medications  Medication Sig Dispense Refill   acetaminophen  (TYLENOL ) 500 MG tablet Take 1,000-2,000 mg by mouth every 6 (six) hours as needed for mild pain or headache. (Patient taking differently: Take 1,000-2,000 mg by mouth as needed for mild pain (pain score 1-3) or headache.)     atorvastatin  (LIPITOR) 40 MG tablet TAKE 1 TABLET BY MOUTH EVERY DAY 90 tablet 2   buPROPion  (WELLBUTRIN  XL) 150 MG 24 hr tablet Take 150 mg by mouth daily.     cyanocobalamin 2000 MCG tablet Take 5,000 mcg by mouth daily. One daily     ELIQUIS  5 MG TABS tablet TAKE 1 TABLET BY MOUTH TWICE  A DAY 180 tablet 3   flecainide  (TAMBOCOR ) 100 MG tablet TAKE 1 TABLET BY MOUTH TWICE A DAY 180 tablet 3   furosemide  (LASIX ) 20 MG tablet TAKE 1 TABLET (20 MG) BY MOUTH ON MONDAYS, THURSDAYS, & SATURDAYS 36 tablet 2   hydrOXYzine (ATARAX/VISTARIL) 25 MG tablet Take 25 mg by mouth at bedtime.     lisinopril  (ZESTRIL ) 5 MG  tablet TAKE 1 TABLET (5 MG TOTAL) BY MOUTH DAILY. 90 tablet 3   metoprolol  succinate (TOPROL -XL) 25 MG 24 hr tablet TAKE 1/2 TABLET BY MOUTH EVERY DAY 45 tablet 3   nystatin-triamcinolone (MYCOLOG II) cream Apply 1 application  topically 2 (two) times daily as needed for rash.     potassium chloride  (KLOR-CON ) 10 MEQ tablet Take 1 tablet (10 meq) by mouth on Mondays, Thursdays, & Saturdays (take with furosemide ) 36 tablet 2   pramipexole (MIRAPEX) 0.25 MG tablet Take 0.25-0.5 mg by mouth at bedtime.     sertraline  (ZOLOFT ) 100 MG tablet Take 200 mg by mouth every morning.      Vitamin D3 (VITAMIN D) 25 MCG tablet Take 1,000 Units by mouth daily.     Multiple Vitamin (MULTIVITAMIN) tablet Take 1 tablet by mouth daily. Woman's 50 + (Patient not taking: Reported on 01/14/2024)     No current facility-administered medications for this encounter.   Facility-Administered Medications Ordered in Other Encounters  Medication Dose Route Frequency Provider Last Rate Last Admin   0.9 %  sodium chloride  infusion   Intravenous Continuous Christine Males, PA-C       0.9 %  sodium chloride  infusion   Intravenous Continuous Christine Males, PA-C        Physical Exam: BP 136/72   Pulse (!) 50   Ht 5' 6 (1.676 m)   Wt 90.3 kg Comment: Patient did not want to weigh  BMI 32.12 kg/m   GEN: Well nourished, well developed in no acute distress CARDIAC: Regular rate and rhythm, no murmurs, rubs, gallops RESPIRATORY:  Clear to auscultation without rales, wheezing or rhonchi  ABDOMEN: Soft, non-tender, non-distended EXTREMITIES:  No edema; No deformity   Wt Readings from Last 3 Encounters:  01/14/24 90.3 kg  07/13/23 92.1 kg  01/10/23 90.3 kg     EKG Interpretation Date/Time:  Monday January 14 2024 09:08:25 EST Ventricular Rate:  50 PR Interval:  186 QRS Duration:  104 QT Interval:  440 QTC Calculation: 401 R Axis:   64  Text Interpretation: Sinus bradycardia Otherwise normal ECG When compared with  ECG of 13-Jul-2023 08:51, No significant change was found Confirmed by Oluwasemilore Pascuzzi ([id number]) on 01/14/2024 9:14:18 AM    Echo 10/12/20 demonstrated   1. Left ventricular ejection fraction, by estimation, is 60 to 65%. Left  ventricular ejection fraction by 3D volume is 62 %. The left ventricle has  normal function. The left ventricle has no regional wall motion  abnormalities. Left ventricular diastolic parameters were normal.   2. Right ventricular systolic function is normal. The right ventricular  size is normal. There is normal pulmonary artery systolic pressure.   3. Left atrial size was mildly dilated.   4. The mitral valve is normal in structure. Mild mitral valve  regurgitation.   5. The aortic valve is normal in structure. Aortic valve regurgitation is  not visualized. No aortic stenosis is present.     CHA2DS2-VASc Score = 3  The patient's score is based upon: CHF History: 0 HTN History: 1 Diabetes History: 0 Stroke History: 0 Vascular  Disease History: 0 Age Score: 1 Gender Score: 1       ASSESSMENT AND PLAN: Paroxysmal Atrial Fibrillation (ICD10:  I48.0) The patient's CHA2DS2-VASc score is 3, indicating a 3.2% annual risk of stroke.   S/p afib ablation 2015, 2016, and 2022. Patient appears to be maintaining SR Continue flecainide  100 mg BID Continue Toprol  12.5 mg daily Continue Eliquis  5 mg BID  Secondary Hypercoagulable State (ICD10:  D68.69) The patient is at significant risk for stroke/thromboembolism based upon her CHA2DS2-VASc Score of 3.  Continue Apixaban  (Eliquis ). No bleeding issues.   High Risk Medication Monitoring (ICD 10: [id number]) Patient requires ongoing monitoring for anti-arrhythmic medication which has the potential to cause life threatening arrhythmias. Intervals on ECG acceptable for flecainide  monitoring.     HTN Stable on current regimen  HFrecEF EF normalized with SR Fluid status appears stable today   Follow up in 6 months to  establish care with new EP. AF clinic in one year.     [facility] [address] [place], [place] [address] [phone]

## 2024-01-16 ENCOUNTER — Ambulatory Visit: Admitting: Behavioral Health

## 2024-01-29 ENCOUNTER — Encounter: Payer: Self-pay | Admitting: Behavioral Health

## 2024-01-30 ENCOUNTER — Ambulatory Visit: Admitting: Behavioral Health

## 2024-01-31 ENCOUNTER — Other Ambulatory Visit: Payer: Self-pay | Admitting: Cardiology

## 2024-02-28 ENCOUNTER — Ambulatory Visit: Admitting: Behavioral Health

## 2024-02-28 ENCOUNTER — Encounter: Payer: Self-pay | Admitting: Behavioral Health

## 2024-02-28 DIAGNOSIS — F411 Generalized anxiety disorder: Secondary | ICD-10-CM
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
## 2024-03-13 ENCOUNTER — Ambulatory Visit: Admitting: Behavioral Health

## 2024-03-13 ENCOUNTER — Encounter: Payer: Self-pay | Admitting: Behavioral Health

## 2024-03-13 DIAGNOSIS — F4323 Adjustment disorder with mixed anxiety and depressed mood: Secondary | ICD-10-CM | POA: Diagnosis not present
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
## 2024-03-26 ENCOUNTER — Ambulatory Visit: Admitting: Behavioral Health

## 2024-04-10 ENCOUNTER — Ambulatory Visit: Admitting: Behavioral Health
